# Patient Record
Sex: Male | Born: 1945
Health system: Southern US, Community
[De-identification: ages and names within clinical notes are randomized; demographics above are authoritative.]

## PROBLEM LIST (undated history)

## (undated) DIAGNOSIS — I4891 Unspecified atrial fibrillation: Secondary | ICD-10-CM

## (undated) DIAGNOSIS — K219 Gastro-esophageal reflux disease without esophagitis: Secondary | ICD-10-CM

## (undated) DIAGNOSIS — Z87442 Personal history of urinary calculi: Secondary | ICD-10-CM

## (undated) DIAGNOSIS — I10 Essential (primary) hypertension: Secondary | ICD-10-CM

## (undated) DIAGNOSIS — R112 Nausea with vomiting, unspecified: Secondary | ICD-10-CM

## (undated) DIAGNOSIS — Z9889 Other specified postprocedural states: Secondary | ICD-10-CM

## (undated) DIAGNOSIS — H9190 Unspecified hearing loss, unspecified ear: Secondary | ICD-10-CM

## (undated) DIAGNOSIS — K602 Anal fissure, unspecified: Secondary | ICD-10-CM

## (undated) DIAGNOSIS — M1990 Unspecified osteoarthritis, unspecified site: Secondary | ICD-10-CM

## (undated) DIAGNOSIS — Z8719 Personal history of other diseases of the digestive system: Secondary | ICD-10-CM

## (undated) DIAGNOSIS — M109 Gout, unspecified: Secondary | ICD-10-CM

## (undated) DIAGNOSIS — R413 Other amnesia: Secondary | ICD-10-CM

## (undated) DIAGNOSIS — N189 Chronic kidney disease, unspecified: Secondary | ICD-10-CM

## (undated) DIAGNOSIS — E119 Type 2 diabetes mellitus without complications: Secondary | ICD-10-CM

## (undated) DIAGNOSIS — N400 Enlarged prostate without lower urinary tract symptoms: Secondary | ICD-10-CM

## (undated) DIAGNOSIS — J189 Pneumonia, unspecified organism: Secondary | ICD-10-CM

## (undated) DIAGNOSIS — G4733 Obstructive sleep apnea (adult) (pediatric): Secondary | ICD-10-CM

## (undated) DIAGNOSIS — F32A Depression, unspecified: Secondary | ICD-10-CM

## (undated) DIAGNOSIS — I639 Cerebral infarction, unspecified: Secondary | ICD-10-CM

## (undated) DIAGNOSIS — F329 Major depressive disorder, single episode, unspecified: Secondary | ICD-10-CM

## (undated) DIAGNOSIS — Z8619 Personal history of other infectious and parasitic diseases: Secondary | ICD-10-CM

## (undated) HISTORY — DX: Other amnesia: R41.3

## (undated) HISTORY — PX: OTHER SURGICAL HISTORY: SHX169

## (undated) HISTORY — PX: CATARACT EXTRACTION W/ INTRAOCULAR LENS  IMPLANT, BILATERAL: SHX1307

## (undated) HISTORY — PX: EYE SURGERY: SHX253

## (undated) HISTORY — PX: CHOLECYSTECTOMY: SHX55

## (undated) HISTORY — PX: LIGAMENT REPAIR: SHX5444

## (undated) HISTORY — PX: VASECTOMY: SHX75

## (undated) HISTORY — PX: KIDNEY STONE SURGERY: SHX686

## (undated) HISTORY — PX: TONSILLECTOMY: SUR1361

## (undated) HISTORY — PX: COLONOSCOPY: SHX174

## (undated) HISTORY — PX: KNEE ARTHROSCOPY: SUR90

---

## 2002-08-15 ENCOUNTER — Encounter: Admission: RE | Admit: 2002-08-15 | Discharge: 2002-11-13 | Payer: Self-pay | Admitting: Internal Medicine

## 2004-05-12 ENCOUNTER — Emergency Department (HOSPITAL_COMMUNITY): Admission: EM | Admit: 2004-05-12 | Discharge: 2004-05-13 | Payer: Self-pay | Admitting: Emergency Medicine

## 2004-08-09 ENCOUNTER — Encounter: Admission: RE | Admit: 2004-08-09 | Discharge: 2004-08-09 | Payer: Self-pay | Admitting: Internal Medicine

## 2004-08-20 ENCOUNTER — Encounter: Admission: RE | Admit: 2004-08-20 | Discharge: 2004-08-20 | Payer: Self-pay | Admitting: Internal Medicine

## 2004-08-22 ENCOUNTER — Ambulatory Visit (HOSPITAL_COMMUNITY): Admission: RE | Admit: 2004-08-22 | Discharge: 2004-08-22 | Payer: Self-pay | Admitting: Internal Medicine

## 2005-07-22 ENCOUNTER — Ambulatory Visit (HOSPITAL_COMMUNITY): Admission: RE | Admit: 2005-07-22 | Discharge: 2005-07-22 | Payer: Self-pay | Admitting: Orthopedic Surgery

## 2005-07-22 ENCOUNTER — Ambulatory Visit (HOSPITAL_BASED_OUTPATIENT_CLINIC_OR_DEPARTMENT_OTHER): Admission: RE | Admit: 2005-07-22 | Discharge: 2005-07-22 | Payer: Self-pay | Admitting: Orthopedic Surgery

## 2007-12-20 ENCOUNTER — Encounter: Admission: RE | Admit: 2007-12-20 | Discharge: 2007-12-20 | Payer: Self-pay | Admitting: Internal Medicine

## 2009-07-03 ENCOUNTER — Encounter: Admission: RE | Admit: 2009-07-03 | Discharge: 2009-07-03 | Payer: Self-pay | Admitting: Internal Medicine

## 2009-08-16 ENCOUNTER — Ambulatory Visit (HOSPITAL_COMMUNITY): Admission: RE | Admit: 2009-08-16 | Discharge: 2009-08-16 | Payer: Self-pay | Admitting: Surgery

## 2010-04-18 ENCOUNTER — Ambulatory Visit (HOSPITAL_COMMUNITY): Admission: RE | Admit: 2010-04-18 | Discharge: 2010-04-18 | Payer: Self-pay | Admitting: Gastroenterology

## 2010-05-24 ENCOUNTER — Encounter: Admission: RE | Admit: 2010-05-24 | Discharge: 2010-05-24 | Payer: Self-pay | Admitting: Internal Medicine

## 2010-06-10 ENCOUNTER — Ambulatory Visit (HOSPITAL_COMMUNITY): Admission: RE | Admit: 2010-06-10 | Discharge: 2010-06-11 | Payer: Self-pay | Admitting: Urology

## 2010-09-01 ENCOUNTER — Encounter: Payer: Self-pay | Admitting: Internal Medicine

## 2010-10-22 LAB — GLUCOSE, CAPILLARY
Glucose-Capillary: 209 mg/dL — ABNORMAL HIGH (ref 70–99)
Glucose-Capillary: 230 mg/dL — ABNORMAL HIGH (ref 70–99)

## 2010-10-23 LAB — CBC
HCT: 36.4 % — ABNORMAL LOW (ref 39.0–52.0)
Hemoglobin: 12.7 g/dL — ABNORMAL LOW (ref 13.0–17.0)
MCH: 31.1 pg (ref 26.0–34.0)
MCHC: 34.9 g/dL (ref 30.0–36.0)
MCV: 89.2 fL (ref 78.0–100.0)
Platelets: 165 10*3/uL (ref 150–400)
RBC: 4.09 MIL/uL — ABNORMAL LOW (ref 4.22–5.81)
RDW: 13.6 % (ref 11.5–15.5)
WBC: 6.1 10*3/uL (ref 4.0–10.5)

## 2010-10-23 LAB — COMPREHENSIVE METABOLIC PANEL
ALT: 117 U/L — ABNORMAL HIGH (ref 0–53)
AST: 143 U/L — ABNORMAL HIGH (ref 0–37)
Albumin: 3.5 g/dL (ref 3.5–5.2)
Alkaline Phosphatase: 54 U/L (ref 39–117)
BUN: 25 mg/dL — ABNORMAL HIGH (ref 6–23)
CO2: 25 mEq/L (ref 19–32)
Calcium: 9.3 mg/dL (ref 8.4–10.5)
Chloride: 100 mEq/L (ref 96–112)
Creatinine, Ser: 1.27 mg/dL (ref 0.4–1.5)
GFR calc Af Amer: >60 mL/min (ref >60)
GFR calc non Af Amer: 57 mL/min — ABNORMAL LOW (ref >60)
Glucose, Bld: 388 mg/dL — ABNORMAL HIGH (ref 70–99)
Potassium: 4.7 mEq/L (ref 3.5–5.1)
Sodium: 135 mEq/L (ref 135–145)
Total Bilirubin: 0.7 mg/dL (ref 0.3–1.2)
Total Protein: 7 g/dL (ref 6.0–8.3)

## 2010-10-23 LAB — BLOOD GAS, ARTERIAL
Drawn by: 331001
FIO2: 0.6 %
PEEP: 8 cmH2O
RATE: 10 resp/min
pCO2 arterial: 37.9 mmHg (ref 35.0–45.0)
pO2, Arterial: 149 mmHg — ABNORMAL HIGH (ref 80.0–100.0)

## 2010-10-23 LAB — GLUCOSE, CAPILLARY
Glucose-Capillary: 260 mg/dL — ABNORMAL HIGH (ref 70–99)
Glucose-Capillary: 312 mg/dL — ABNORMAL HIGH (ref 70–99)
Glucose-Capillary: 318 mg/dL — ABNORMAL HIGH (ref 70–99)
Glucose-Capillary: 329 mg/dL — ABNORMAL HIGH (ref 70–99)
Glucose-Capillary: 339 mg/dL — ABNORMAL HIGH (ref 70–99)
Glucose-Capillary: 363 mg/dL — ABNORMAL HIGH (ref 70–99)
Glucose-Capillary: 382 mg/dL — ABNORMAL HIGH (ref 70–99)
Glucose-Capillary: 403 mg/dL — ABNORMAL HIGH (ref 70–99)
Glucose-Capillary: 419 mg/dL — ABNORMAL HIGH (ref 70–99)

## 2010-10-23 LAB — SURGICAL PCR SCREEN
MRSA, PCR: NEGATIVE
Staphylococcus aureus: NEGATIVE

## 2010-10-24 LAB — GLUCOSE, CAPILLARY: Glucose-Capillary: 270 mg/dL — ABNORMAL HIGH (ref 70–99)

## 2010-10-27 LAB — COMPREHENSIVE METABOLIC PANEL
ALT: 60 U/L — ABNORMAL HIGH (ref 0–53)
AST: 58 U/L — ABNORMAL HIGH (ref 0–37)
CO2: 28 mEq/L (ref 19–32)
Calcium: 9.8 mg/dL (ref 8.4–10.5)
Chloride: 101 mEq/L (ref 96–112)
GFR calc Af Amer: >60 mL/min (ref >60)
GFR calc non Af Amer: 60 mL/min — ABNORMAL LOW (ref >60)
Sodium: 139 mEq/L (ref 135–145)
Total Bilirubin: 0.5 mg/dL (ref 0.3–1.2)

## 2010-10-27 LAB — DIFFERENTIAL
Eosinophils Absolute: 0.5 10*3/uL (ref 0.0–0.7)
Eosinophils Relative: 6 % — ABNORMAL HIGH (ref 0–5)
Lymphs Abs: 2 10*3/uL (ref 0.7–4.0)
Monocytes Absolute: 0.6 10*3/uL (ref 0.1–1.0)

## 2010-10-27 LAB — CBC
RBC: 3.86 MIL/uL — ABNORMAL LOW (ref 4.22–5.81)
WBC: 8.1 10*3/uL (ref 4.0–10.5)

## 2010-10-27 LAB — GLUCOSE, CAPILLARY: Glucose-Capillary: 252 mg/dL — ABNORMAL HIGH (ref 70–99)

## 2010-12-27 NOTE — Op Note (Signed)
NAME:  Oscar [name], Oscar [name] NO.:  [id number]   MEDICAL RECORD NO.:  [id number]          PATIENT TYPE:  AMB   LOCATION:  DSC                          FACILITY:  [facility]   PHYSICIAN:  Katy Fitch. Sypher, M.D. DATE OF BIRTH:  09-05-1945   DATE OF PROCEDURE:  07/22/2005  DATE OF DISCHARGE:                                 OPERATIVE REPORT   PREOPERATIVE DIAGNOSES:  1.  Chronic stenosing tenosynovitis, right long finger A1 pulley.  2.  Ulnocarpal abutment with signs of internal derangement of ulnocarpal      joint, right wrist, rule out chondromalacia of lunate, triquetrum and      the triangular fibrocartilage degenerative changes.   POSTOPERATIVE DIAGNOSES:  1.  Chronic stenosing tenosynovitis, right long finger A1 pulley.  2.  Ulnocarpal abutment with signs of internal derangement of ulnocarpal      joint, right wrist, rule out chondromalacia of lunate, triquetrum and      the triangular fibrocartilage degenerative changes.   OPERATION:  1.  Release of right long finger A1 pulley.  2.  Injection of right radiocarpal and ulnocarpal joints with Depo-Medrol      and 2% lidocaine.   OPERATIONS:  Katy Fitch. Sypher, M.D.   ASSISTANT:  Molly Maduro Dasnoit PA-C.   ANESTHESIA:  2% lidocaine field block of right palm supplemented by IV  sedation, supervising anesthesiologist Dr. Ivin Booty.   PROCEDURE:  Oscar [name] is brought to the operating room and placed in the  supine position on the operating table.   Following an anesthesia consultation by Dr. Ivin Booty, monitored anesthesia care  with local anesthesia of the operative site was selected as appropriate.   Following transfer to room 6, [name] was placed in supine position on  the operating table.  Following light sedation, the right arm was prepped  with Betadine soap and solution and sterilely draped.  A pneumatic  tourniquet was applied to the proximal right brachium.   Following exsanguination of the right arm with an  Esmarch bandage, the  arterial tourniquet was inflated to 250 mmHg.   The procedure commenced with infiltration of 2% lidocaine in the path of the  intended incision in the long finger flexor sheath.   After appropriate sedation, the wrist was injected between the triangular  fibrocartilage and the triquetrum with approximately 10 pounds' traction  applied and the landmarks palpated.  A 50/50 mixture of Depo-Medrol 40 mg/mL  and 2% lidocaine plain was injected 1 mL into the ulnocarpal joint and 1 mL  into the radiocarpal joint at the 3/4 dorsal portal used for arthroscopy.   The injections were uncomplicated.   Attention was then directed to the trigger release.   The hand was placed in a lead hand and the operative site examined.  The  thickened A1 pulley was easily palpated.   An oblique incision was fashioned in the distal palmar crease exposing the  palmar fascia.  This was released with scissors.  The A1 pulley was isolated  and subsequently split along its radial border with a scalpel and scissors.   A small A0  pulley was identified and released.   Thereafter, Mr. Dollar recovered full active range of motion of the long  finger, bringing the fingernail to the palm.   The wound was then repaired with mattress sutures of 5-0 nylon.  There no  apparent complications.   For aftercare [name] was placed a compressive dressing with Xeroflo,  sterile gauze and Ace wrap.   He will return to see [name] the office in one week for wound check and suture  removal.   He is provided prescriptions for Vicodin 5 mg one p.o. q.4-6h. p.r.n. pain,  20 tablets without refill.      Katy Fitch Sypher, M.D.  Electronically Signed     RVS/MEDQ  D:  07/22/2005  T:  07/23/2005  Job:  [id number]

## 2011-03-19 ENCOUNTER — Encounter (INDEPENDENT_AMBULATORY_CARE_PROVIDER_SITE_OTHER): Payer: Self-pay | Admitting: Ophthalmology

## 2011-04-09 ENCOUNTER — Encounter (INDEPENDENT_AMBULATORY_CARE_PROVIDER_SITE_OTHER): Payer: BC Managed Care – PPO | Admitting: Ophthalmology

## 2011-04-09 DIAGNOSIS — E11319 Type 2 diabetes mellitus with unspecified diabetic retinopathy without macular edema: Secondary | ICD-10-CM

## 2011-04-09 DIAGNOSIS — H43819 Vitreous degeneration, unspecified eye: Secondary | ICD-10-CM

## 2011-04-09 DIAGNOSIS — H2510 Age-related nuclear cataract, unspecified eye: Secondary | ICD-10-CM

## 2011-10-09 ENCOUNTER — Ambulatory Visit (INDEPENDENT_AMBULATORY_CARE_PROVIDER_SITE_OTHER): Payer: BC Managed Care – PPO | Admitting: Ophthalmology

## 2011-10-27 ENCOUNTER — Ambulatory Visit (INDEPENDENT_AMBULATORY_CARE_PROVIDER_SITE_OTHER): Payer: BC Managed Care – PPO | Admitting: Ophthalmology

## 2011-10-27 DIAGNOSIS — I10 Essential (primary) hypertension: Secondary | ICD-10-CM

## 2011-10-27 DIAGNOSIS — H2510 Age-related nuclear cataract, unspecified eye: Secondary | ICD-10-CM

## 2011-10-27 DIAGNOSIS — H35039 Hypertensive retinopathy, unspecified eye: Secondary | ICD-10-CM

## 2011-10-27 DIAGNOSIS — E1139 Type 2 diabetes mellitus with other diabetic ophthalmic complication: Secondary | ICD-10-CM

## 2011-10-27 DIAGNOSIS — H43819 Vitreous degeneration, unspecified eye: Secondary | ICD-10-CM

## 2011-10-27 DIAGNOSIS — E11319 Type 2 diabetes mellitus with unspecified diabetic retinopathy without macular edema: Secondary | ICD-10-CM

## 2012-03-03 ENCOUNTER — Other Ambulatory Visit: Payer: Self-pay | Admitting: Internal Medicine

## 2012-03-03 DIAGNOSIS — R439 Unspecified disturbances of smell and taste: Secondary | ICD-10-CM

## 2012-03-07 ENCOUNTER — Ambulatory Visit
Admission: RE | Admit: 2012-03-07 | Discharge: 2012-03-07 | Disposition: A | Discharge status: Home / Self Care | Payer: BC Managed Care – PPO | Source: Ambulatory Visit | Attending: Internal Medicine | Admitting: Internal Medicine

## 2012-03-07 DIAGNOSIS — R439 Unspecified disturbances of smell and taste: Secondary | ICD-10-CM

## 2012-04-28 ENCOUNTER — Ambulatory Visit (INDEPENDENT_AMBULATORY_CARE_PROVIDER_SITE_OTHER): Payer: BC Managed Care – PPO | Admitting: Ophthalmology

## 2012-05-19 ENCOUNTER — Ambulatory Visit (INDEPENDENT_AMBULATORY_CARE_PROVIDER_SITE_OTHER): Payer: BC Managed Care – PPO | Admitting: Ophthalmology

## 2012-11-16 ENCOUNTER — Encounter (HOSPITAL_COMMUNITY): Payer: Self-pay | Admitting: *Deleted

## 2012-11-16 ENCOUNTER — Inpatient Hospital Stay (HOSPITAL_COMMUNITY)
Admission: EM | Admit: 2012-11-16 | Discharge: 2012-11-18 | DRG: 138 | Disposition: A | Discharge status: Home / Self Care | Payer: BC Managed Care – PPO | Attending: Internal Medicine | Admitting: Internal Medicine

## 2012-11-16 DIAGNOSIS — Z87442 Personal history of urinary calculi: Secondary | ICD-10-CM

## 2012-11-16 DIAGNOSIS — R161 Splenomegaly, not elsewhere classified: Secondary | ICD-10-CM | POA: Diagnosis present

## 2012-11-16 DIAGNOSIS — I2510 Atherosclerotic heart disease of native coronary artery without angina pectoris: Secondary | ICD-10-CM | POA: Diagnosis present

## 2012-11-16 DIAGNOSIS — I319 Disease of pericardium, unspecified: Secondary | ICD-10-CM | POA: Diagnosis present

## 2012-11-16 DIAGNOSIS — Z794 Long term (current) use of insulin: Secondary | ICD-10-CM

## 2012-11-16 DIAGNOSIS — R072 Precordial pain: Secondary | ICD-10-CM | POA: Diagnosis present

## 2012-11-16 DIAGNOSIS — T380X5A Adverse effect of glucocorticoids and synthetic analogues, initial encounter: Secondary | ICD-10-CM | POA: Diagnosis present

## 2012-11-16 DIAGNOSIS — I2584 Coronary atherosclerosis due to calcified coronary lesion: Secondary | ICD-10-CM | POA: Diagnosis present

## 2012-11-16 DIAGNOSIS — I519 Heart disease, unspecified: Secondary | ICD-10-CM | POA: Diagnosis present

## 2012-11-16 DIAGNOSIS — Z6839 Body mass index (BMI) 39.0-39.9, adult: Secondary | ICD-10-CM

## 2012-11-16 DIAGNOSIS — Z91041 Radiographic dye allergy status: Secondary | ICD-10-CM

## 2012-11-16 DIAGNOSIS — E1165 Type 2 diabetes mellitus with hyperglycemia: Secondary | ICD-10-CM | POA: Diagnosis present

## 2012-11-16 DIAGNOSIS — Z79899 Other long term (current) drug therapy: Secondary | ICD-10-CM

## 2012-11-16 DIAGNOSIS — N182 Chronic kidney disease, stage 2 (mild): Secondary | ICD-10-CM | POA: Diagnosis present

## 2012-11-16 DIAGNOSIS — R071 Chest pain on breathing: Secondary | ICD-10-CM

## 2012-11-16 DIAGNOSIS — I484 Atypical atrial flutter: Secondary | ICD-10-CM

## 2012-11-16 DIAGNOSIS — R091 Pleurisy: Secondary | ICD-10-CM | POA: Diagnosis present

## 2012-11-16 DIAGNOSIS — I4891 Unspecified atrial fibrillation: Principal | ICD-10-CM | POA: Diagnosis present

## 2012-11-16 DIAGNOSIS — M129 Arthropathy, unspecified: Secondary | ICD-10-CM | POA: Diagnosis present

## 2012-11-16 DIAGNOSIS — Z9089 Acquired absence of other organs: Secondary | ICD-10-CM

## 2012-11-16 DIAGNOSIS — G4733 Obstructive sleep apnea (adult) (pediatric): Secondary | ICD-10-CM | POA: Diagnosis present

## 2012-11-16 DIAGNOSIS — K219 Gastro-esophageal reflux disease without esophagitis: Secondary | ICD-10-CM | POA: Diagnosis present

## 2012-11-16 DIAGNOSIS — I4892 Unspecified atrial flutter: Secondary | ICD-10-CM | POA: Diagnosis present

## 2012-11-16 DIAGNOSIS — I129 Hypertensive chronic kidney disease with stage 1 through stage 4 chronic kidney disease, or unspecified chronic kidney disease: Secondary | ICD-10-CM | POA: Diagnosis present

## 2012-11-16 DIAGNOSIS — IMO0002 Reserved for concepts with insufficient information to code with codable children: Secondary | ICD-10-CM | POA: Diagnosis present

## 2012-11-16 DIAGNOSIS — R079 Chest pain, unspecified: Secondary | ICD-10-CM | POA: Diagnosis present

## 2012-11-16 DIAGNOSIS — M109 Gout, unspecified: Secondary | ICD-10-CM | POA: Diagnosis present

## 2012-11-16 DIAGNOSIS — R0781 Pleurodynia: Secondary | ICD-10-CM

## 2012-11-16 DIAGNOSIS — E118 Type 2 diabetes mellitus with unspecified complications: Secondary | ICD-10-CM | POA: Diagnosis present

## 2012-11-16 HISTORY — DX: Gastro-esophageal reflux disease without esophagitis: K21.9

## 2012-11-16 HISTORY — DX: Unspecified osteoarthritis, unspecified site: M19.90

## 2012-11-16 HISTORY — DX: Essential (primary) hypertension: I10

## 2012-11-16 HISTORY — DX: Obstructive sleep apnea (adult) (pediatric): G47.33

## 2012-11-16 HISTORY — DX: Anal fissure, unspecified: K60.2

## 2012-11-16 HISTORY — DX: Type 2 diabetes mellitus without complications: E11.9

## 2012-11-16 HISTORY — DX: Personal history of other diseases of the digestive system: Z87.19

## 2012-11-16 LAB — POCT I-STAT, CHEM 8
BUN: 22 mg/dL (ref 6–23)
Chloride: 106 mEq/L (ref 96–112)
Creatinine, Ser: 1.3 mg/dL (ref 0.50–1.35)
Glucose, Bld: 216 mg/dL — ABNORMAL HIGH (ref 70–99)
Potassium: 4 mEq/L (ref 3.5–5.1)
Sodium: 143 mEq/L (ref 135–145)

## 2012-11-16 LAB — GLUCOSE, CAPILLARY
Glucose-Capillary: 254 mg/dL — ABNORMAL HIGH (ref 70–99)
Glucose-Capillary: 261 mg/dL — ABNORMAL HIGH (ref 70–99)
Glucose-Capillary: 362 mg/dL — ABNORMAL HIGH (ref 70–99)

## 2012-11-16 LAB — HEPARIN LEVEL (UNFRACTIONATED)
Heparin Unfractionated: 0.19 IU/mL — ABNORMAL LOW (ref 0.30–0.70)
Heparin Unfractionated: 0.26 IU/mL — ABNORMAL LOW (ref 0.30–0.70)

## 2012-11-16 LAB — CBC WITH DIFFERENTIAL/PLATELET
Hemoglobin: 13.5 g/dL (ref 13.0–17.0)
Lymphocytes Relative: 20 % (ref 12–46)
Lymphs Abs: 2.2 10*3/uL (ref 0.7–4.0)
MCH: 31.1 pg (ref 26.0–34.0)
Monocytes Relative: 10 % (ref 3–12)
Neutro Abs: 7.3 10*3/uL (ref 1.7–7.7)
Neutrophils Relative %: 67 % (ref 43–77)
RBC: 4.34 MIL/uL (ref 4.22–5.81)
WBC: 10.9 10*3/uL — ABNORMAL HIGH (ref 4.0–10.5)

## 2012-11-16 LAB — D-DIMER, QUANTITATIVE: D-Dimer, Quant: 0.36 ug/mL-FEU (ref 0.00–0.48)

## 2012-11-16 LAB — POCT I-STAT TROPONIN I

## 2012-11-16 LAB — TROPONIN I: Troponin I: <0.3 ng/mL (ref <0.30)

## 2012-11-16 LAB — TSH: TSH: 1.165 u[IU]/mL (ref 0.350–4.500)

## 2012-11-16 MED ORDER — LORATADINE 10 MG PO TABS
10.0000 mg | ORAL_TABLET | Freq: Every day | ORAL | Status: DC
  Administered 2012-11-16 – 2012-11-17 (×2): 10 mg via ORAL
  Filled 2012-11-16 (×3): qty 1

## 2012-11-16 MED ORDER — DILTIAZEM LOAD VIA INFUSION
15.0000 mg | Freq: Once | INTRAVENOUS | Status: AC
  Administered 2012-11-16: 15 mg via INTRAVENOUS
  Filled 2012-11-16: qty 15

## 2012-11-16 MED ORDER — HEPARIN (PORCINE) IN NACL 100-0.45 UNIT/ML-% IJ SOLN
2000.0000 [IU]/h | INTRAMUSCULAR | Status: DC
  Administered 2012-11-16: 14:00:00 1800 [IU]/h via INTRAVENOUS
  Administered 2012-11-16: 1550 [IU]/h via INTRAVENOUS
  Administered 2012-11-16: 20:00:00 1800 [IU]/h via INTRAVENOUS
  Filled 2012-11-16 (×5): qty 250

## 2012-11-16 MED ORDER — INSULIN ASPART 100 UNIT/ML ~~LOC~~ SOLN: 0.0000 [IU] | Freq: Three times a day (TID) | SUBCUTANEOUS | Status: DC

## 2012-11-16 MED ORDER — PREDNISONE 50 MG PO TABS
50.0000 mg | ORAL_TABLET | Freq: Once | ORAL | Status: AC
  Administered 2012-11-16: 20:00:00 50 mg via ORAL
  Filled 2012-11-16: qty 1

## 2012-11-16 MED ORDER — KETOROLAC TROMETHAMINE 30 MG/ML IJ SOLN
30.0000 mg | Freq: Three times a day (TID) | INTRAMUSCULAR | Status: DC | PRN
  Administered 2012-11-16 (×2): 30 mg via INTRAVENOUS
  Filled 2012-11-16 (×2): qty 1

## 2012-11-16 MED ORDER — INSULIN ASPART 100 UNIT/ML ~~LOC~~ SOLN
0.0000 [IU] | Freq: Every day | SUBCUTANEOUS | Status: DC
  Administered 2012-11-16: 22:00:00 5 [IU] via SUBCUTANEOUS

## 2012-11-16 MED ORDER — DIPHENHYDRAMINE HCL 50 MG PO CAPS
50.0000 mg | ORAL_CAPSULE | ORAL | Status: AC
  Administered 2012-11-16: 50 mg via ORAL
  Filled 2012-11-16: qty 1

## 2012-11-16 MED ORDER — ONDANSETRON HCL 4 MG/2ML IJ SOLN: 4.0000 mg | Freq: Four times a day (QID) | INTRAMUSCULAR | Status: DC | PRN

## 2012-11-16 MED ORDER — INSULIN ASPART 100 UNIT/ML ~~LOC~~ SOLN
0.0000 [IU] | Freq: Three times a day (TID) | SUBCUTANEOUS | Status: DC
  Administered 2012-11-16 (×3): 8 [IU] via SUBCUTANEOUS
  Administered 2012-11-17 (×2): 15 [IU] via SUBCUTANEOUS
  Administered 2012-11-17: 20:00:00 11 [IU] via SUBCUTANEOUS
  Administered 2012-11-18: 2 [IU] via SUBCUTANEOUS

## 2012-11-16 MED ORDER — TERAZOSIN HCL 5 MG PO CAPS
5.0000 mg | ORAL_CAPSULE | Freq: Every day | ORAL | Status: DC
  Administered 2012-11-16 – 2012-11-17 (×2): 5 mg via ORAL
  Filled 2012-11-16 (×3): qty 1

## 2012-11-16 MED ORDER — FEBUXOSTAT 40 MG PO TABS
80.0000 mg | ORAL_TABLET | Freq: Every day | ORAL | Status: DC
  Administered 2012-11-16 – 2012-11-17 (×2): 80 mg via ORAL
  Filled 2012-11-16 (×3): qty 2

## 2012-11-16 MED ORDER — ACETAMINOPHEN 650 MG RE SUPP: 650.0000 mg | Freq: Four times a day (QID) | RECTAL | Status: DC | PRN

## 2012-11-16 MED ORDER — DILTIAZEM HCL 30 MG PO TABS
30.0000 mg | ORAL_TABLET | Freq: Four times a day (QID) | ORAL | Status: DC
  Administered 2012-11-16 – 2012-11-17 (×3): 30 mg via ORAL
  Filled 2012-11-16 (×7): qty 1

## 2012-11-16 MED ORDER — MORPHINE SULFATE 4 MG/ML IJ SOLN
4.0000 mg | Freq: Once | INTRAMUSCULAR | Status: AC
  Administered 2012-11-16: 4 mg via INTRAVENOUS
  Filled 2012-11-16: qty 1

## 2012-11-16 MED ORDER — ONDANSETRON HCL 4 MG PO TABS: 4.0000 mg | ORAL_TABLET | Freq: Four times a day (QID) | ORAL | Status: DC | PRN

## 2012-11-16 MED ORDER — MORPHINE SULFATE 2 MG/ML IJ SOLN
2.0000 mg | INTRAMUSCULAR | Status: DC | PRN
  Administered 2012-11-16: 12:00:00 2 mg via INTRAVENOUS
  Filled 2012-11-16: qty 1

## 2012-11-16 MED ORDER — HYDROMORPHONE HCL PF 1 MG/ML IJ SOLN
1.0000 mg | Freq: Once | INTRAMUSCULAR | Status: AC
  Administered 2012-11-16: 1 mg via INTRAVENOUS
  Filled 2012-11-16: qty 1

## 2012-11-16 MED ORDER — DIPHENHYDRAMINE HCL 50 MG PO CAPS
50.0000 mg | ORAL_CAPSULE | Freq: Once | ORAL | Status: DC
  Filled 2012-11-16: qty 1

## 2012-11-16 MED ORDER — DILTIAZEM HCL 100 MG IV SOLR
5.0000 mg/h | INTRAVENOUS | Status: DC
  Administered 2012-11-16: 5 mg/h via INTRAVENOUS
  Administered 2012-11-16: 3 mg/h via INTRAVENOUS

## 2012-11-16 MED ORDER — HEPARIN BOLUS VIA INFUSION
4000.0000 [IU] | Freq: Once | INTRAVENOUS | Status: AC
  Administered 2012-11-16: 4000 [IU] via INTRAVENOUS

## 2012-11-16 MED ORDER — ASPIRIN EC 325 MG PO TBEC
325.0000 mg | DELAYED_RELEASE_TABLET | Freq: Every day | ORAL | Status: DC
  Administered 2012-11-16 – 2012-11-17 (×2): 325 mg via ORAL
  Filled 2012-11-16 (×3): qty 1

## 2012-11-16 MED ORDER — NITROGLYCERIN 0.4 MG SL SUBL: 0.4000 mg | SUBLINGUAL_TABLET | SUBLINGUAL | Status: DC | PRN

## 2012-11-16 MED ORDER — PREDNISONE 50 MG PO TABS
50.0000 mg | ORAL_TABLET | Freq: Once | ORAL | Status: AC
  Administered 2012-11-16: 50 mg via ORAL
  Filled 2012-11-16: qty 1

## 2012-11-16 MED ORDER — ACETAMINOPHEN 325 MG PO TABS: 650.0000 mg | ORAL_TABLET | Freq: Four times a day (QID) | ORAL | Status: DC | PRN

## 2012-11-16 MED ORDER — GLYBURIDE 5 MG PO TABS
5.0000 mg | ORAL_TABLET | Freq: Two times a day (BID) | ORAL | Status: DC
  Administered 2012-11-16 – 2012-11-18 (×4): 5 mg via ORAL
  Filled 2012-11-16 (×6): qty 1

## 2012-11-16 MED ORDER — SODIUM CHLORIDE 0.9 % IV SOLN
INTRAVENOUS | Status: AC
  Administered 2012-11-16: 06:00:00 via INTRAVENOUS

## 2012-11-16 MED ORDER — PREDNISONE 50 MG PO TABS
50.0000 mg | ORAL_TABLET | Freq: Once | ORAL | Status: AC
  Administered 2012-11-17: 02:00:00 50 mg via ORAL
  Filled 2012-11-16: qty 1

## 2012-11-16 MED ORDER — PANTOPRAZOLE SODIUM 40 MG PO TBEC
40.0000 mg | DELAYED_RELEASE_TABLET | Freq: Every day | ORAL | Status: DC
  Administered 2012-11-16 – 2012-11-18 (×3): 40 mg via ORAL
  Filled 2012-11-16 (×3): qty 1

## 2012-11-16 MED ORDER — PREDNISONE 50 MG PO TABS
50.0000 mg | ORAL_TABLET | Freq: Four times a day (QID) | ORAL | Status: DC
  Filled 2012-11-16 (×3): qty 1

## 2012-11-16 NOTE — ED Notes (Signed)
MD at bedside. Consult 

## 2012-11-16 NOTE — Progress Notes (Signed)
Utilization Review Completed Deberah Adolf J. Pedro Oldenburg, RN, BSN, NCM [phone]  

## 2012-11-16 NOTE — ED Provider Notes (Signed)
History     CSN: [id number]  Arrival date & time 11/16/12  4098   First MD Initiated Contact with Patient 11/16/12 0102      Chief Complaint  Patient presents with  . Chest Pain   HPI Oscar [name] is a 67 y.o. male with a history of diabetes, but no known history of hypertension, hyperlipidemia, remote smoking history who presents with substernal chest pain and shortness of breath.  Patient's symptoms started approximately 8:00 are described as a cramping/grabbing sensation in the substernal area, that started mildly however got worse over the course of the evening is currently an 8/10. He describes it as worse on deep breaths, he does not have any history of venous thromboembolic disease, no history of hemoptysis, no history of recent immobilization, long bone fracture or cancer treatment. Patient did have his lites back and forth to [facility] on 3/12 and 3/18 but denies any history of leg pain or leg swelling unilaterally or bilaterally. Family history is pertinent for sister dying from MI at aged 66.  Denies any history of fevers, chills, productive cough, any upper respiratory symptoms.  Past Medical History  Diagnosis Date  . Diabetes mellitus without complication     History reviewed. No pertinent past surgical history.  History reviewed. No pertinent family history.  History  Substance Use Topics  . Smoking status: Never Smoker   . Smokeless tobacco: Not on file  . Alcohol Use: Yes      Review of Systems At least 10pt or greater review of systems completed and are negative except where specified in the HPI.   Allergies  Iohexol  Home Medications   Current Outpatient Rx  Name  Route  Sig  Dispense  Refill  . febuxostat (ULORIC) 40 MG tablet   Oral   Take 80 mg by mouth daily.         Marland Kitchen Fexofenadine HCl (ALLEGRA PO)   Oral   Take 1 tablet by mouth daily.         Marland Kitchen glyBURIDE (DIABETA) 5 MG tablet   Oral   Take 5 mg by mouth 2 (two) times daily with a meal.         . metFORMIN (GLUCOPHAGE) 1000 MG tablet   Oral   Take 1,000 mg by mouth 2 (two) times daily with a meal.         . terazosin (HYTRIN) 5 MG capsule   Oral   Take 5 mg by mouth at bedtime.           BP 171/79  Pulse 149  Temp(Src) 98.9 F (37.2 C) (Oral)  Resp 20  SpO2 98%  Physical Exam  Nursing notes reviewed.  Electronic medical record reviewed. VITAL SIGNS:   Filed Vitals:   11/16/12 0400 11/16/12 0445 11/16/12 0500 11/16/12 0645  BP: 126/91 124/80  137/64  Pulse: 87 94  89  Temp:      TempSrc:      Resp: 15 20  20   Height:   5\' 9"  (1.753 m)   Weight:   265 lb (120.203 kg)   SpO2: 98% 97%  95%   CONSTITUTIONAL: Awake, oriented, appears non-toxic HENT: Atraumatic, normocephalic, oral mucosa pink and moist, airway patent. Nares patent without drainage. External ears normal. EYES: Conjunctiva clear, EOMI, PERRLA NECK: Trachea midline, non-tender, supple CARDIOVASCULAR: Tachycardic, irregularly irregular PULMONARY/CHEST: Clear to auscultation, no rhonchi, wheezes, or rales. Symmetrical breath sounds. Non-tender. ABDOMINAL: Non-distended, obese, soft, non-tender - no rebound or guarding.  BS normal. NEUROLOGIC: Non-focal, moving all four extremities, no gross sensory or motor deficits. EXTREMITIES: No clubbing, cyanosis, or edema SKIN: Warm, Dry, No erythema, No rash  ED Course  Procedures (including critical care time)  Date: 11/16/2012  Rate: 153  Rhythm: Atrial fibrillation with rapid ventricular response, occasional sinus beats  QRS Axis: normal  Intervals: normal  ST/T Wave abnormalities: normal  Conduction Disutrbances: none  Narrative Interpretation: Occasional PVC, appears to be fibrillation in the inferior leads, no signs of ischemia or infarction   Labs Reviewed  CBC WITH DIFFERENTIAL - Abnormal; Notable for the following:    WBC 10.9 (*)    HCT 38.0 (*)    Monocytes Absolute 1.1 (*)    All other components within normal limits  POCT  I-STAT, CHEM 8 - Abnormal; Notable for the following:    Glucose, Bld 216 (*)    All other components within normal limits  D-DIMER, QUANTITATIVE  TROPONIN I  TROPONIN I  TROPONIN I  HEPARIN LEVEL (UNFRACTIONATED)  POCT I-STAT TROPONIN I   No results found.   1. Atrial fibrillation with rapid ventricular response   2. Chest pain       MDM  Oscar [name] is a 67 y.o. male presenting with atrial fibrillation and rapid ventricular response this seems to be paroxysmal this is been in and out of this rhythm this is been in the ER. I don't see evidence of ischemia or infarction on his EKG. Patient was placed on a diltiazem drip for rate control, the patient's rate came down to 80s and 90s, pain got somewhat better however still having chest pain. Troponin negative. Discussed with cardiology for admission. Patient has negative d-dimer, a low pretest probability for PE in this patient - do not think his pleuritic chest pain is related to his lungs.           Jones Skene, MD 11/16/12 [phone]

## 2012-11-16 NOTE — Progress Notes (Signed)
ANTICOAGULATION CONSULT NOTE - Initial Consult  Pharmacy Consult for Heparin Indication: atrial fibrillation  Allergies  Allergen Reactions  . Iohexol      Desc: HIVES S/P 13HR.PREMEDS,PT WEIGHS 367LBS,?LOW DOSAGE PREMEDS     Patient Measurements: Height: 5\' 9"  (175.3 cm) Weight: 265 lb (120.203 kg) IBW/kg (Calculated) : 70.7 Heparin Dosing Weight: 100 kg   Vital Signs: Temp: 98.9 F (37.2 C) (04/08 0038) Temp src: Oral (04/08 0038) BP: 124/80 mmHg (04/08 0445) Pulse Rate: 94 (04/08 0445)  Labs:  Recent Labs  11/16/12 0040 11/16/12 0053  HGB 13.5 13.3  HCT 38.0* 39.0  PLT 203  --   CREATININE  --  1.30    Estimated Creatinine Clearance: 70.6 ml/min (by C-G formula based on Cr of 1.3).   Medical History: Past Medical History  Diagnosis Date  . Diabetes mellitus without complication     Medications:  Uloric  Allegra  Glyburide  Metformin  Hytrin  Assessment: 67 yo male with new onset Afib for heparin  Goal of Therapy:  Heparin level 0.3-0.7 units/ml Monitor platelets by anticoagulation protocol: Yes   Plan:  Heparin 4000 units IV bolus, then 1550 units/hr Check heparin level in 6 hours.   Eddie Candle 11/16/2012,5:24 AM

## 2012-11-16 NOTE — ED Notes (Addendum)
Fast regular pulse with dropped pulse beats at registration, back to triage for EKG, took "regular bayer ASA" 325mg  PTA, c/o CP and SOB, denies cardiac hx, skin clammy. Wife present.

## 2012-11-16 NOTE — H&P (Signed)
Patient's PCP: Pearla Dubonnet, MD Patient's endocrinologist: Dr. Sharl Ma.  Chief Complaint: Chest pain  History of Present Illness: Oscar [name] is a 67 y.o. Caucasian male with history of diabetes, gout, and hypertension which is controlled through diet and exercise he presents with the above complaints.  Last night around 8 p.m. before supper patient developing midsternal chest pain, with some radiating pain to the left neck.  During the night the pain continued to worsen as a result he presented to the ER for further evaluation.  He was found to be in A. fib with RVR and was started on diltiazem which converted him to normal sinus rhythm.  Hospitalist service was asked but the patient for further care and management.  He denies any pain running down his left arm, any jaw pain or diaphoresis.  He denies having these symptoms before.  He has not had any recent fevers, chills, nausea, vomiting, shortness of breath, abdominal pain, diarrhea, headaches or vision changes  Review of Systems: All systems reviewed with the patient and positive as per history of present illness, otherwise all other systems are negative.  Past Medical History  Diagnosis Date  . Diabetes mellitus without complication    History reviewed. No pertinent past surgical history. Family History  Problem Relation Age of Onset  . COPD Father    History   Social History  . Marital Status: Married    Spouse Name: N/A    Number of Children: N/A  . Years of Education: N/A   Occupational History  . Not on file.   Social History Main Topics  . Smoking status: Never Smoker   . Smokeless tobacco: Not on file  . Alcohol Use: Yes  . Drug Use: No  . Sexually Active: Not on file   Other Topics Concern  . Not on file   Social History Narrative  . No narrative on file   Allergies: Iohexol  Home Meds: Prior to Admission medications   Medication Sig Start Date End Date Taking? Authorizing Provider  febuxostat  (ULORIC) 40 MG tablet Take 80 mg by mouth daily.   Yes Historical Provider, MD  Fexofenadine HCl (ALLEGRA PO) Take 1 tablet by mouth daily.   Yes Historical Provider, MD  glyBURIDE (DIABETA) 5 MG tablet Take 5 mg by mouth 2 (two) times daily with a meal.   Yes Historical Provider, MD  metFORMIN (GLUCOPHAGE) 1000 MG tablet Take 1,000 mg by mouth 2 (two) times daily with a meal.   Yes Historical Provider, MD  terazosin (HYTRIN) 5 MG capsule Take 5 mg by mouth at bedtime.   Yes Historical Provider, MD    Physical Exam: Blood pressure 124/80, pulse 94, temperature 98.9 F (37.2 C), temperature source Oral, resp. rate 20, height 5\' 9"  (1.753 m), weight 120.203 kg (265 lb), SpO2 97.00%. General: Awake, Oriented x3, No acute distress. HEENT: EOMI, Moist mucous membranes Neck: Supple CV: S1 and S2 Lungs: Clear to ascultation bilaterally Abdomen: Soft, Nontender, Nondistended, +bowel sounds. Ext: Good pulses. Trace edema. No clubbing or cyanosis noted. Neuro: Cranial Nerves II-XII grossly intact. Has 5/5 motor strength in upper and lower extremities.  Lab results:  Recent Labs  11/16/12 0053  NA 143  K 4.0  CL 106  GLUCOSE 216*  BUN 22  CREATININE 1.30   No results found for this basename: AST, ALT, ALKPHOS, BILITOT, PROT, ALBUMIN,  in the last 72 hours No results found for this basename: LIPASE, AMYLASE,  in the last 72 hours  Recent Labs  11/16/12 0040 11/16/12 0053  WBC 10.9*  --   NEUTROABS 7.3  --   HGB 13.5 13.3  HCT 38.0* 39.0  MCV 87.6  --   PLT 203  --    No results found for this basename: CKTOTAL, CKMB, CKMBINDEX, TROPONINI,  in the last 72 hours No components found with this basename: POCBNP,   Recent Labs  11/16/12 0151  DDIMER 0.36   No results found for this basename: HGBA1C,  in the last 72 hours No results found for this basename: CHOL, HDL, LDLCALC, TRIG, CHOLHDL, LDLDIRECT,  in the last 72 hours No results found for this basename: TSH, T4TOTAL, FREET3,  T3FREE, THYROIDAB,  in the last 72 hours No results found for this basename: VITAMINB12, FOLATE, FERRITIN, TIBC, IRON, RETICCTPCT,  in the last 72 hours Imaging results:  No results found. Other results: EKG: EKG shows sinus tachycardia with heart rate 103, rhythm strips showed A. fib with RVR.  Assessment & Plan by Problem: Chest pain Likely due to A. fib with RVR.  Initial troponin negative.  Continue to monitor on telemetry.  Cycle cardiac enzymes.  EKG shows sinus tachycardia without ischemic changes.  Given patient had A. fib, consider cardiology consultation in the morning for cardiac testing.  Atrial fibrillation with rapid ventricular rate Patient converted to sinus rhythm on diltiazem.  Continue diltiazem, titrate for heart rate less than 100, hold if systolic blood pressure less than 100.  Will get a 2-D echocardiogram.  Consider cardiology consultation in the morning.  Patient has CHA2DS2-VASC score of 2, anticoagulate the patient on heparin initially, likely will need long-term anticoagulation after discharge.  Morbid obesity Continue to diet and exercise as outpatient.  Hypertension Currently not on any antihypertensive medications.  At home is controlled with diet and exercise.  Type 2 diabetes uncontrolled with complications Sliding scale insulin.  Continue home medications except metformin.  Chronic kidney disease stage II Renal function at baseline.  Prophylaxis Heparin drip.  CODE STATUS Full code.  Disposition Admit the patient to step down as inpatient.  Transfer the patient to Dr. Judithann Sauger service in the morning.  Time spent on admission, talking to the patient, and coordinating care was: 60 mins.  Nateisha Moyd A, MD 11/16/2012, 5:30 AM

## 2012-11-16 NOTE — Progress Notes (Signed)
ANTICOAGULATION CONSULT NOTE - Follow Up Consult  Pharmacy Consult for heparin Indication: atrial fibrillation  Allergies  Allergen Reactions  . Iohexol      Desc: HIVES S/P 13HR.PREMEDS,PT WEIGHS 367LBS,?LOW DOSAGE PREMEDS     Patient Measurements: Height: 5\' 9"  (175.3 cm) Weight: 265 lb (120.203 kg) IBW/kg (Calculated) : 70.7 Heparin Dosing Weight: 100 kg  Vital Signs: Temp: 98.1 F (36.7 C) (04/08 1930) Temp src: Oral (04/08 1930) BP: 124/77 mmHg (04/08 1930) Pulse Rate: 74 (04/08 1930)  Labs:  Recent Labs  11/16/12 0040 11/16/12 0053 11/16/12 1219 11/16/12 1734 11/16/12 1952  HGB 13.5 13.3  --   --   --   HCT 38.0* 39.0  --   --   --   PLT 203  --   --   --   --   HEPARINUNFRC  --   --  0.19*  --  0.26*  CREATININE  --  1.30  --   --   --   TROPONINI  --   --  <0.30 <0.30  --     Estimated Creatinine Clearance: 70.6 ml/min (by C-G formula based on Cr of 1.3).   Medications:  Scheduled:  . aspirin EC  325 mg Oral Daily  . [COMPLETED] diltiazem  15 mg Intravenous Once  . diltiazem  30 mg Oral Q6H  . [COMPLETED] diphenhydrAMINE  50 mg Oral NOW  . febuxostat  80 mg Oral Daily  . glyBURIDE  5 mg Oral BID WC  . [COMPLETED] heparin  4,000 Units Intravenous Once  . [COMPLETED]  HYDROmorphone (DILAUDID) injection  1 mg Intravenous Once  . insulin aspart  0-15 Units Subcutaneous TID WC  . insulin aspart  0-5 Units Subcutaneous QHS  . loratadine  10 mg Oral Daily  . [COMPLETED]  morphine injection  4 mg Intravenous Once  . pantoprazole  40 mg Oral Daily  . [COMPLETED] predniSONE  50 mg Oral Once  . [COMPLETED] predniSONE  50 mg Oral Once  . [START ON 11/17/2012] predniSONE  50 mg Oral Once  . terazosin  5 mg Oral QHS  . [DISCONTINUED] diphenhydrAMINE  50 mg Oral Once  . [DISCONTINUED] insulin aspart  0-15 Units Subcutaneous TID WC  . [DISCONTINUED] predniSONE  50 mg Oral Q6H   Infusions:  . sodium chloride 75 mL/hr at 11/16/12 0900  . heparin 1,800  Units/hr (11/16/12 1934)  . [DISCONTINUED] diltiazem (CARDIZEM) infusion 3 mg/hr (11/16/12 1015)    Assessment: Heparin for new onset Afib. Also concern for PE due to flight to [facility] few weeks ago vs pericarditis. Will get CT to r/o PE. DDimer 0.36 WNL. Heparin level 0.26. H/H 13.5/38 and Plt 203 K on 11/16/12.   Goal of Therapy:  Heparin level 0.3-0.7 units/ml Monitor platelets by anticoagulation protocol: Yes   Plan:  1) Increase IV heparin to 2000 units/hr  2) Recheck heparin level with am labs.    Kenady Doxtater S. Merilynn Finland, PharmD, BCPS Clinical Staff Pharmacist Pager [phone]  Misty Stanley Stillinger 11/16/2012,8:40 PM

## 2012-11-16 NOTE — ED Notes (Signed)
Pt c/o CP with SOB that began this evening while watching television.  Denies n/v, diaphoresis, dizziness/weakness.

## 2012-11-16 NOTE — Progress Notes (Signed)
ANTICOAGULATION CONSULT NOTE - Follow Up Consult  Pharmacy Consult for heparin Indication: atrial fibrillation  Allergies  Allergen Reactions  . Iohexol      Desc: HIVES S/P 13HR.PREMEDS,PT WEIGHS 367LBS,?LOW DOSAGE PREMEDS     Patient Measurements: Height: 5\' 9"  (175.3 cm) Weight: 265 lb (120.203 kg) IBW/kg (Calculated) : 70.7 Heparin Dosing Weight: 100 kg  Vital Signs: Temp: 97.8 F (36.6 C) (04/08 1201) Temp src: Oral (04/08 1201) BP: 104/64 mmHg (04/08 1201) Pulse Rate: 76 (04/08 1201)  Labs:  Recent Labs  11/16/12 0040 11/16/12 0053 11/16/12 1219  HGB 13.5 13.3  --   HCT 38.0* 39.0  --   PLT 203  --   --   HEPARINUNFRC  --   --  0.19*  CREATININE  --  1.30  --     Estimated Creatinine Clearance: 70.6 ml/min (by C-G formula based on Cr of 1.3).   Medications:  Scheduled:  . aspirin EC  325 mg Oral Daily  . [COMPLETED] diltiazem  15 mg Intravenous Once  . [COMPLETED] diphenhydrAMINE  50 mg Oral NOW  . febuxostat  80 mg Oral Daily  . glyBURIDE  5 mg Oral BID WC  . [COMPLETED] heparin  4,000 Units Intravenous Once  . [COMPLETED]  HYDROmorphone (DILAUDID) injection  1 mg Intravenous Once  . insulin aspart  0-15 Units Subcutaneous TID WC  . insulin aspart  0-5 Units Subcutaneous QHS  . loratadine  10 mg Oral Daily  . [COMPLETED]  morphine injection  4 mg Intravenous Once  . pantoprazole  40 mg Oral Daily  . [COMPLETED] predniSONE  50 mg Oral Once  . predniSONE  50 mg Oral Once  . [START ON 11/17/2012] predniSONE  50 mg Oral Once  . terazosin  5 mg Oral QHS  . [DISCONTINUED] diphenhydrAMINE  50 mg Oral Once  . [DISCONTINUED] insulin aspart  0-15 Units Subcutaneous TID WC  . [DISCONTINUED] predniSONE  50 mg Oral Q6H   Infusions:  . sodium chloride 75 mL/hr at 11/16/12 0900  . diltiazem (CARDIZEM) infusion 3 mg/hr (11/16/12 1015)  . heparin 1,550 Units/hr (11/16/12 0900)    Assessment: 67 yo male with new onset afib is currently on subtherapeutic  heparin.  Heparin level is 0.19.  H/H 13.5/38 and Plt 203 K Goal of Therapy:  Heparin level 0.3-0.7 units/ml Monitor platelets by anticoagulation protocol: Yes   Plan:  1) Increase heparin drip to 1800 units/hr 2) 6hr heparin level.  Tramon Crescenzo, Tsz-Yin 11/16/2012,1:44 PM

## 2012-11-16 NOTE — Consult Note (Signed)
CARDIOLOGY CONSULT NOTE  Patient ID: HAGOP MCCOLLAM, MRN: [id number], DOB/AGE: 10/01/1945 67 y.o. Admit date: 11/16/2012   Date of Consult: 11/16/2012 Primary Physician: Pearla Dubonnet, MD Primary Cardiologist: new to [facility]. Dr. Gala Romney cares for his wife.  Chief Complaint: chest pain Reason for Consult: chest pain, afib  HPI: Mr. Daversa is a 67 y/o M with history of DM/HTN controlled mostly by diet/exercise, OSA on CPAP, and GERD who presented to [facility] with complaints of chest pain. Last night around 8pm while watching TV, he developed gradual onset of chest pain with inspiration described as a muscle spasm sensation in the center of his chest. It was not associated with any SOB, nausea, diaphoresis, or palpitations. He took 325mg  ASA prior to admission. The symptoms persisted so he came to the ER just after midnight where he was found to be in a rapid narrow-complex rhythm with RVR and was started on diltiazem with spontaneous conversion to NSR. He has never had pain like this before. He currently feels better than on admission, but still has pain with inspiration in the center of his chest. It is somewhat better when sitting upright (but he also feels generally better from a back/butt standpoint getting out of the bed). He does not exercise much but went golfing this week without any exertional CP or SOB. He otherwise denies any recent fever, chills, nausea, vomiting, LEE. He flew to [facility] (5 hr plane ride) a few weeks ago. Troponin neg x 1 and d-dimer WNL, WBC mildly elevated to 10.9. EKG nonacute when in sinus.  Past Medical History  Diagnosis Date  . Diabetes mellitus without complication     improved after diet/exercise  . Hypertension     improved after diet/exercise  . Kidney stones   . GERD (gastroesophageal reflux disease)   . H/O hiatal hernia   . Arthritis     hips  . OSA (obstructive sleep apnea)     wears cpap  . Anal fissure     h/o - no recent complications       Most Recent Cardiac Studies: None   Surgical History:  Past Surgical History  Procedure Laterality Date  . Cholecystectomy    . Knee arthroscopy    . Carpel tunnel    . Ligament repair    . Kidney stone surgery       Home Meds: Prior to Admission medications   Medication Sig Start Date End Date Taking? Authorizing Provider  febuxostat (ULORIC) 40 MG tablet Take 80 mg by mouth daily.   Yes Historical Provider, MD  Fexofenadine HCl (ALLEGRA PO) Take 1 tablet by mouth daily.   Yes Historical Provider, MD  glyBURIDE (DIABETA) 5 MG tablet Take 5 mg by mouth 2 (two) times daily with a meal.   Yes Historical Provider, MD  metFORMIN (GLUCOPHAGE) 1000 MG tablet Take 1,000 mg by mouth 2 (two) times daily with a meal.   Yes Historical Provider, MD  terazosin (HYTRIN) 5 MG capsule Take 5 mg by mouth at bedtime.   Yes Historical Provider, MD    Inpatient Medications:  . aspirin EC  325 mg Oral Daily  . febuxostat  80 mg Oral Daily  . glyBURIDE  5 mg Oral BID WC  . insulin aspart  0-15 Units Subcutaneous TID WC  . insulin aspart  0-5 Units Subcutaneous QHS  . loratadine  10 mg Oral Daily  . terazosin  5 mg Oral QHS    Allergies:  Allergies  Allergen Reactions  . Iohexol      Desc: HIVES S/P 13HR.PREMEDS,PT WEIGHS 367LBS,?LOW DOSAGE PREMEDS     History   Social History  . Marital Status: Married    Spouse Name: N/A    Number of Children: N/A  . Years of Education: N/A   Occupational History  . Not on file.   Social History Main Topics  . Smoking status: Never Smoker   . Smokeless tobacco: Never Used  . Alcohol Use: Yes     Comment: rare - 1-2x /yr  . Drug Use: No  . Sexually Active: Not on file   Other Topics Concern  . Not on file   Social History Narrative  . No narrative on file     Family History  Problem Relation Age of Onset  . COPD Father   . Heart attack Sister     died age 30 of MI     Review of Systems: General: negative for chills, fever,  night sweats or weight changes.  Cardiovascular: see above. No orthopnea. Dermatological: negative for rash Respiratory: negative for cough or wheezing Urologic: negative for hematuria Abdominal: negative for nausea, vomiting, diarrhea, bright red blood per rectum, melena, or hematemesis. Remote h/o rectal bleeding from anal fissure but none in a long time. Neurologic: negative for visual changes, syncope, or dizziness All other systems reviewed and are otherwise negative except as noted above.  Labs: No results found for this basename: CKTOTAL, CKMB, TROPONINI,  in the last 72 hours Lab Results  Component Value Date   WBC 10.9* 11/16/2012   HGB 13.3 11/16/2012   HCT 39.0 11/16/2012   MCV 87.6 11/16/2012   PLT 203 11/16/2012    Recent Labs Lab 11/16/12 0053  NA 143  K 4.0  CL 106  BUN 22  CREATININE 1.30  GLUCOSE 216*    Lab Results  Component Value Date   DDIMER 0.36 11/16/2012    Radiology/Studies:  No results found.  EKG: Initial: atrial tach vs atrial flutter RVR 153bpm, PVC nonspecific ST-T changes Followup: sinus tach 104bpm, PVC and PAC noted, nonspecific ST-T changes  Physical Exam: Blood pressure 129/84, pulse 76, temperature 98.1 F (36.7 C), temperature source Oral, resp. rate 20, height 5\' 9"  (1.753 m), weight 265 lb (120.203 kg), SpO2 96.00%. General: Well developed obese WM in no acute distress. Head: Normocephalic, atraumatic, sclera non-icteric, no xanthomas, nares are without discharge.  Neck: Negative for carotid bruits. JVD not elevated. Lungs: Clear bilaterally to auscultation without wheezes, rales, or rhonchi. Breathing is unlabored. Heart: RRR with S1 S2. No murmurs, rubs, or gallops appreciated. Abdomen: Soft, non-tender, obese, with normoactive bowel sounds. No hepatomegaly. No rebound/guarding. No obvious abdominal masses. Msk:  Strength and tone appear normal for age. Extremities: No clubbing or cyanosis. No edema.  Distal pedal pulses are 2+ and  equal bilaterally. Neuro: Alert and oriented X 3. No facial asymmetry. No focal deficit. Moves all extremities spontaneously. Psych:  Responds to questions appropriately with a normal affect.   Assessment and Plan:   1. Chest pain, pleuritic/positional 2. Atrial tach vs atrial flutter with RVR, first recognized episode 3. Diabetes mellitus 4. HTN, controlled with diet & exercise  Signed, Ronie Spies PA-C 11/16/2012, 10:43 AM  Patient seen and examined with Ronie Spies, PA-C. We discussed all aspects of the encounter. I agree with the assessment and plan as stated above.   Mr. Oboyle is a 67 y/o with HTN and DM2 and OSA on CPAP. No known h/o  heart disease. Admitted with probable atypical flutter with RVR (vs atrial tach). No back in SR. CP pleuritic in nature and also very positional (worse on lying down). I am concerned about PE or pericarditis. He had plane flight to [facility] 2 weeks ago.  CE and ECG normal. D-dimer negative. Would continue heparin for now. Check CT for PE and pericardial disease. (given dye allergy with hives will pretreat). Will also check echo. Treat pain with toradol. Given CHADS2 will likely need outpatient anticoagulation with Eliquis or Xarelto. D/w Dr. Kevan Ny.    Daniel Bensimhon,MD 12:09 PM

## 2012-11-16 NOTE — Progress Notes (Signed)
Subjective: Oscar [name] presented to [facility] last night with chest pain. Patient experienced pain radiating into his left neck. Not associated with shortness of breath or diaphoresis. There is a pleuritic nature to his substernal chest pain as well. No fevers or chills. Concern is for pericarditis or PE the patient also was noted to have atrial flutter and fibrillation with a rapid ventricular response. Now in normal sinus rhythm on diltiazem. Appreciate consultation by Dr. Nicholes Mango  Objective: Weight change:   Intake/Output Summary (Last 24 hours) at 11/16/12 1806 Last data filed at 11/16/12 0505  Gross per 24 hour  Intake      0 ml  Output    300 ml  Net   -300 ml   Filed Vitals:   11/16/12 1100 11/16/12 1200 11/16/12 1201 11/16/12 1555  BP: 133/73 104/64 104/64 104/43  Pulse: 77 77 76 76  Temp:   97.8 F (36.6 C) 98.8 F (37.1 C)  TempSrc:   Oral Oral  Resp:   20 20  Height:      Weight:      SpO2: 92% 97% 95% 95%    General Appearance: Alert, cooperative, no distress, appears stated age Neck:   No JVD or bruits Lungs: Clear to auscultation bilaterally, respirations unlabored Heart: Regular rate and rhythm, S1 and S2 normal, no murmur, rub or gallop Abdomen: Soft, non-tender, bowel sounds active all four quadrants, no masses, no organomegaly Extremities: Extremities normal, atraumatic, no cyanosis or edema Neuro: Alert and oriented nonfocal  Lab Results: Results for orders placed during the hospital encounter of 11/16/12 (from the past 48 hour(s))  CBC WITH DIFFERENTIAL     Status: Abnormal   Collection Time    11/16/12 12:40 AM      Result Value Range   WBC 10.9 (*) 4.0 - 10.5 K/uL   RBC 4.34  4.22 - 5.81 MIL/uL   Hemoglobin 13.5  13.0 - 17.0 g/dL   HCT 57.8 (*) 46.9 - 62.9 %   MCV 87.6  78.0 - 100.0 fL   MCH 31.1  26.0 - 34.0 pg   MCHC 35.5  30.0 - 36.0 g/dL   RDW 52.8  41.3 - 24.4 %   Platelets 203  150 - 400 K/uL   Neutrophils Relative 67  43 -  77 %   Neutro Abs 7.3  1.7 - 7.7 K/uL   Lymphocytes Relative 20  12 - 46 %   Lymphs Abs 2.2  0.7 - 4.0 K/uL   Monocytes Relative 10  3 - 12 %   Monocytes Absolute 1.1 (*) 0.1 - 1.0 K/uL   Eosinophils Relative 2  0 - 5 %   Eosinophils Absolute 0.3  0.0 - 0.7 K/uL   Basophils Relative 0  0 - 1 %   Basophils Absolute 0.0  0.0 - 0.1 K/uL  POCT I-STAT TROPONIN I     Status: None   Collection Time    11/16/12 12:51 AM      Result Value Range   Troponin i, poc 0.00  0.00 - 0.08 ng/mL   Comment 3            Comment: Due to the release kinetics of cTnI,     a negative result within the first hours     of the onset of symptoms does not rule out     myocardial infarction with certainty.     If myocardial infarction is still suspected,     repeat the  test at appropriate intervals.  POCT I-STAT, CHEM 8     Status: Abnormal   Collection Time    11/16/12 12:53 AM      Result Value Range   Sodium 143  135 - 145 mEq/L   Potassium 4.0  3.5 - 5.1 mEq/L   Chloride 106  96 - 112 mEq/L   BUN 22  6 - 23 mg/dL   Creatinine, Ser 1.61  0.50 - 1.35 mg/dL   Glucose, Bld 096 (*) 70 - 99 mg/dL   Calcium, Ion 0.45  4.09 - 1.30 mmol/L   TCO2 26  0 - 100 mmol/L   Hemoglobin 13.3  13.0 - 17.0 g/dL   HCT 81.1  91.4 - 78.2 %  D-DIMER, QUANTITATIVE     Status: None   Collection Time    11/16/12  1:51 AM      Result Value Range   D-Dimer, Quant 0.36  0.00 - 0.48 ug/mL-FEU   Comment:            AT THE INHOUSE ESTABLISHED CUTOFF     VALUE OF 0.48 ug/mL FEU,     THIS ASSAY HAS BEEN DOCUMENTED     IN THE LITERATURE TO HAVE     A SENSITIVITY AND NEGATIVE     PREDICTIVE VALUE OF AT LEAST     98 TO 99%.  THE TEST RESULT     SHOULD BE CORRELATED WITH     AN ASSESSMENT OF THE CLINICAL     PROBABILITY OF DVT / VTE.  GLUCOSE, CAPILLARY     Status: Abnormal   Collection Time    11/16/12  9:03 AM      Result Value Range   Glucose-Capillary 254 (*) 70 - 99 mg/dL  TROPONIN I     Status: None   Collection Time     11/16/12 12:19 PM      Result Value Range   Troponin I <0.30  <0.30 ng/mL   Comment:            Due to the release kinetics of cTnI,     a negative result within the first hours     of the onset of symptoms does not rule out     myocardial infarction with certainty.     If myocardial infarction is still suspected,     repeat the test at appropriate intervals.  HEPARIN LEVEL (UNFRACTIONATED)     Status: Abnormal   Collection Time    11/16/12 12:19 PM      Result Value Range   Heparin Unfractionated 0.19 (*) 0.30 - 0.70 IU/mL   Comment:            IF HEPARIN RESULTS ARE BELOW     EXPECTED VALUES, AND PATIENT     DOSAGE HAS BEEN CONFIRMED,     SUGGEST FOLLOW UP TESTING     OF ANTITHROMBIN III LEVELS.  TSH     Status: None   Collection Time    11/16/12 12:19 PM      Result Value Range   TSH 1.165  0.350 - 4.500 uIU/mL  GLUCOSE, CAPILLARY     Status: Abnormal   Collection Time    11/16/12 12:36 PM      Result Value Range   Glucose-Capillary 297 (*) 70 - 99 mg/dL  GLUCOSE, CAPILLARY     Status: Abnormal   Collection Time    11/16/12  3:58 PM      Result Value Range  Glucose-Capillary 261 (*) 70 - 99 mg/dL    Studies/Results: No results found. Medications: Scheduled Meds: . aspirin EC  325 mg Oral Daily  . diltiazem  30 mg Oral Q6H  . febuxostat  80 mg Oral Daily  . glyBURIDE  5 mg Oral BID WC  . insulin aspart  0-15 Units Subcutaneous TID WC  . insulin aspart  0-5 Units Subcutaneous QHS  . loratadine  10 mg Oral Daily  . pantoprazole  40 mg Oral Daily  . predniSONE  50 mg Oral Once  . [START ON 11/17/2012] predniSONE  50 mg Oral Once  . terazosin  5 mg Oral QHS   Continuous Infusions: . sodium chloride 75 mL/hr at 11/16/12 0900  . heparin 1,800 Units/hr (11/16/12 1345)   PRN Meds:.acetaminophen, acetaminophen, ketorolac, morphine injection, nitroGLYCERIN, ondansetron (ZOFRAN) IV, ondansetron  Assessment/Plan: Principal Problem:   Chest pain -  intrafibrillation with pleuritic chest pain. Rule out pericarditis and/or pulmonary embolism. Patient being premedicated for chest CT to check for pulmonary emboli. D-dimer is relatively low, fortunately. Active Problems:   Atrial fibrillation with RVR - no normal sinus rhythm on diltiazem. We'll need to transition to oral diltiazem and will likely need outpatient anticoagulation per cardiology   Gout - asymptomatic currently   Diabetes mellitus type 2, uncontrolled, with complications - usual medications with sliding scale NovoLog insulin   CKD (chronic kidney disease), stage II   Atypical atrial flutter - back in sinus rhythm   Pleuritic chest pain - treat with Toradol p.r.n. And workup as above   Morbid obesity   Sleep apnea on CPAP    LOS: 0 days   Pearla Dubonnet, MD 11/16/2012, 6:06 PM

## 2012-11-17 ENCOUNTER — Encounter (HOSPITAL_COMMUNITY): Payer: Self-pay | Admitting: Radiology

## 2012-11-17 ENCOUNTER — Inpatient Hospital Stay (HOSPITAL_COMMUNITY): Payer: BC Managed Care – PPO

## 2012-11-17 DIAGNOSIS — I517 Cardiomegaly: Secondary | ICD-10-CM

## 2012-11-17 LAB — BASIC METABOLIC PANEL
CO2: 19 mEq/L (ref 19–32)
Chloride: 106 mEq/L (ref 96–112)
Creatinine, Ser: 1.48 mg/dL — ABNORMAL HIGH (ref 0.50–1.35)
GFR calc Af Amer: 55 mL/min — ABNORMAL LOW (ref >90)
Potassium: 4.7 mEq/L (ref 3.5–5.1)
Sodium: 135 mEq/L (ref 135–145)

## 2012-11-17 LAB — GLUCOSE, CAPILLARY
Glucose-Capillary: 368 mg/dL — ABNORMAL HIGH (ref 70–99)
Glucose-Capillary: 333 mg/dL — ABNORMAL HIGH (ref 70–99)
Glucose-Capillary: 199 mg/dL — ABNORMAL HIGH (ref 70–99)

## 2012-11-17 LAB — HEPARIN LEVEL (UNFRACTIONATED): Heparin Unfractionated: 0.48 IU/mL (ref 0.30–0.70)

## 2012-11-17 LAB — HEMOGLOBIN A1C
Hgb A1c MFr Bld: 6.4 % — ABNORMAL HIGH (ref <5.7)
Mean Plasma Glucose: 137 mg/dL — ABNORMAL HIGH (ref <117)

## 2012-11-17 LAB — LIPID PANEL
LDL Cholesterol: 101 mg/dL — ABNORMAL HIGH (ref 0–99)
Triglycerides: 137 mg/dL (ref <150)
VLDL: 27 mg/dL (ref 0–40)

## 2012-11-17 LAB — CBC
Platelets: 155 10*3/uL (ref 150–400)
RBC: 3.57 MIL/uL — ABNORMAL LOW (ref 4.22–5.81)
RDW: 14.1 % (ref 11.5–15.5)
WBC: 9.3 10*3/uL (ref 4.0–10.5)

## 2012-11-17 LAB — SEDIMENTATION RATE: Sed Rate: 42 mm/hr — ABNORMAL HIGH (ref 0–16)

## 2012-11-17 MED ORDER — HEPARIN (PORCINE) IN NACL 100-0.45 UNIT/ML-% IJ SOLN
2100.0000 [IU]/h | INTRAMUSCULAR | Status: DC
  Administered 2012-11-18 (×2): 2100 [IU]/h via INTRAVENOUS
  Filled 2012-11-17 (×3): qty 250

## 2012-11-17 MED ORDER — OFF THE BEAT BOOK
Freq: Once | Status: AC
  Administered 2012-11-17: 17:00:00
  Filled 2012-11-17: qty 1

## 2012-11-17 MED ORDER — DILTIAZEM HCL ER COATED BEADS 180 MG PO CP24: 180.0000 mg | ORAL_CAPSULE | Freq: Every day | ORAL | Status: DC

## 2012-11-17 MED ORDER — ASPIRIN 325 MG PO TBEC: 325.0000 mg | DELAYED_RELEASE_TABLET | Freq: Every day | ORAL | Status: DC

## 2012-11-17 MED ORDER — IOHEXOL 350 MG/ML SOLN
100.0000 mL | Freq: Once | INTRAVENOUS | Status: AC | PRN
  Administered 2012-11-17: 100 mL via INTRAVENOUS

## 2012-11-17 MED ORDER — DILTIAZEM HCL 90 MG PO TABS: 180.0000 mg | ORAL_TABLET | Freq: Once | ORAL | Status: DC

## 2012-11-17 MED ORDER — DILTIAZEM HCL ER COATED BEADS 180 MG PO CP24
180.0000 mg | ORAL_CAPSULE | Freq: Every day | ORAL | Status: DC
  Administered 2012-11-17: 180 mg via ORAL
  Filled 2012-11-17 (×2): qty 1

## 2012-11-17 MED ORDER — DILTIAZEM HCL ER COATED BEADS 180 MG PO CP24
180.0000 mg | ORAL_CAPSULE | Freq: Once | ORAL | Status: AC
  Administered 2012-11-17: 20:00:00 180 mg via ORAL
  Filled 2012-11-17: qty 1

## 2012-11-17 MED ORDER — PANTOPRAZOLE SODIUM 40 MG PO TBEC: 40.0000 mg | DELAYED_RELEASE_TABLET | Freq: Every day | ORAL | Status: DC

## 2012-11-17 NOTE — Progress Notes (Signed)
  Patient with recurrent AFL/AF today. Will suspend discharge.  Echo EF 60%. pseudonormal DD. RV ok. LA 4.8 cm  I have discussed with Dr. Ladona Ridgel in EP. He will see tomorrow to determine if he is a candidate for ablation or if we should start with anti-arrhythmic.   Heparin restarted.   Sherrel Ploch,MD 4:53 PM

## 2012-11-17 NOTE — Progress Notes (Addendum)
Cardiology Rounding Note   Subjective:    67 y/o male with h/o HTN, DM admitted with pleuritic CP and transient atypical flutter. CT scan chest yesterday negative for PE. Results below:  1. No evidence of significant pulmonary embolus.  2. Mild patchy bibasilar atelectasis noted; lungs otherwise clear.  3. Diffuse coronary artery calcifications seen.  4. Splenomegaly noted.   Started on toradol for possible pericarditis. No pericardial effusion noted on CT. Echo still pending. Cr 1.27->1.48. Trop - x 2.   No further CP. No arrhythmias on tele.   Objective:   Weight Range:  Vital Signs:   Temp:  [97.8 F (36.6 C)-98.8 F (37.1 C)] 97.8 F (36.6 C) (04/09 0616) Pulse Rate:  [73-79] 78 (04/09 0616) Resp:  [20-22] 20 (04/08 1555) BP: (104-139)/(43-84) 139/74 mmHg (04/09 0616) SpO2:  [91 %-97 %] 93 % (04/09 0616) Weight:  [120.7 kg (266 lb 1.5 oz)] 120.7 kg (266 lb 1.5 oz) (04/09 0006) Last BM Date: 11/15/12  Weight change: Filed Weights   11/16/12 0500 11/17/12 0006  Weight: 120.203 kg (265 lb) 120.7 kg (266 lb 1.5 oz)    Intake/Output:   Intake/Output Summary (Last 24 hours) at 11/17/12 0731 Last data filed at 11/17/12 0700  Gross per 24 hour  Intake 3055.17 ml  Output    600 ml  Net 2455.17 ml     Physical Exam: General:  Well appearing. No resp difficulty HEENT: normal Neck: supple. JVP . Carotids 2+ bilat; no bruits. No lymphadenopathy or thryomegaly appreciated. Cor: PMI nondisplaced. Regular rate & rhythm. No rubs, gallops or murmurs. Lungs: clear Abdomen: obese soft, nontender, nondistended. No hepatosplenomegaly. No bruits or masses. Good bowel sounds. Extremities: no cyanosis, clubbing, rash, edema Neuro: alert & orientedx3, cranial nerves grossly intact. moves all 4 extremities w/o difficulty. Affect pleasant  Telemetry: SR with PACs  Labs: Basic Metabolic Panel:  Recent Labs Lab 11/16/12 0053 11/17/12 0620  NA 143 135  K 4.0 4.7  CL 106 106   CO2  --  19  GLUCOSE 216* 420*  BUN 22 31*  CREATININE 1.30 1.48*  CALCIUM  --  8.8    Liver Function Tests: No results found for this basename: AST, ALT, ALKPHOS, BILITOT, PROT, ALBUMIN,  in the last 168 hours No results found for this basename: LIPASE, AMYLASE,  in the last 168 hours No results found for this basename: AMMONIA,  in the last 168 hours  CBC:  Recent Labs Lab 11/16/12 0040 11/16/12 0053 11/17/12 0620  WBC 10.9*  --  9.3  NEUTROABS 7.3  --   --   HGB 13.5 13.3 11.1*  HCT 38.0* 39.0 31.5*  MCV 87.6  --  88.2  PLT 203  --  155    Cardiac Enzymes:  Recent Labs Lab 11/16/12 1219 11/16/12 1734  TROPONINI <0.30 <0.30    BNP: BNP (last 3 results) No results found for this basename: PROBNP,  in the last 8760 hours   Other results:  EKG:   Imaging: Ct Angio Chest Pe W/cm &/or Wo Cm  11/17/2012  *RADIOLOGY REPORT*  Clinical Data: Chest pain with inspiration.  CT ANGIOGRAPHY CHEST  Technique:  Multidetector CT imaging of the chest using the standard protocol during bolus administration of intravenous contrast. Multiplanar reconstructed images including MIPs were obtained and reviewed to evaluate the vascular anatomy.  Contrast: OMNIPAQUE IOHEXOL 350 MG/ML SOLN  Comparison: Chest radiograph performed 06/10/2010  Findings: There is no evidence of significant pulmonary embolus.  Mild patchy  bibasilar atelectasis is noted.  Evaluation is suboptimal due to motion artifact.  The lungs are otherwise clear. There is no evidence of significant focal consolidation, pleural effusion or pneumothorax.  No masses are identified; no abnormal focal contrast enhancement is seen.  Diffuse coronary artery calcification is noted.  The mediastinum is otherwise unremarkable in appearance.  No mediastinal lymphadenopathy is seen.  No pericardial effusion is identified. The great vessels are grossly unremarkable in appearance.  No axillary lymphadenopathy is seen.  The visualized  portions of the thyroid gland are unremarkable in appearance.  The visualized portions of the liver are unremarkable.  The spleen is enlarged, measuring 16.3 cm in length.  No acute osseous abnormalities are seen.  IMPRESSION:  1.  No evidence of significant pulmonary embolus. 2.  Mild patchy bibasilar atelectasis noted; lungs otherwise clear. 3.  Diffuse coronary artery calcifications seen. 4.  Splenomegaly noted.   Original Report Authenticated By: Tonia Ghent, M.D.       Medications:     Scheduled Medications: . aspirin EC  325 mg Oral Daily  . diltiazem  30 mg Oral Q6H  . febuxostat  80 mg Oral Daily  . glyBURIDE  5 mg Oral BID WC  . insulin aspart  0-15 Units Subcutaneous TID WC  . insulin aspart  0-5 Units Subcutaneous QHS  . loratadine  10 mg Oral Daily  . pantoprazole  40 mg Oral Daily  . terazosin  5 mg Oral QHS     Infusions: . heparin 2,000 Units/hr (11/16/12 2050)     PRN Medications:  acetaminophen, acetaminophen, ketorolac, morphine injection, nitroGLYCERIN, ondansetron (ZOFRAN) IV, ondansetron   Assessment:   1. Chest pain, pleuritic/positional       -CE negative      -CT chest negative PE. Diffuse Coronary calcifications 2. Atypical atrial flutter with RVR, first recognized episode  3. Diabetes mellitus  4. HTN, controlled with diet & exercise 5. Contrast dye allergy 6. Obesity 7. OSA on CPAP 8. Acute kidney injury likely due to contrast and NSAIDs  Plan/Discussion:    CP resolved. Etiology unclear but appeared pericarditic by history. CT negative for PE but + for coronary Ca2+. Enzymes normal.  Echo pending. If echo normal I think he can go home today and let kidneys recover. We discussed further risk stratification with cath vs stress test next week as outpatient (I favor cath due to coronary calcium, risk factors and presence of splenomegaly - ? Elevated RH pressures). Would start statin.  With regard to AFL. He is reluctant to start oral  anti-coagulation. We discussed thromboembolic risk of AFL less than AF but still present. He is at high risk for recurrent atrial dysrhythmias due to size and OSA. Will place monitor as outpatient and if further dysrhythmias would re-discuss NOAC. Uses ASA 325 for now. Would continue po Cardizem CD 240 mg daily.  D/w Dr. Kevan Ny.    Length of Stay: 1   Arvilla Meres 11/17/2012, 7:31 AM

## 2012-11-17 NOTE — Discharge Summary (Addendum)
Physician Discharge Summary  NAME:Oscar [name]  NGE:[id number]  DOB: [birth year]   Admit date: 11/16/2012 Discharge date: 11/17/2012  Discharge Diagnoses:  Principal Problem:   Chest pain - resolved with IV Toradol - question etiology.  2-D echocardiogram pending.  Sedimentation rate 42.  Possible low-grade pericarditis or pleurisy Active Problems:   Atrial fibrillation with RVR - now in sinus rhythm on diltiazem - followup with Dr. Gala Romney in one week.  Echocardiogram pending   Gout - asymptomatic currently   Diabetes mellitus type 2, uncontrolled, with complications - hyperglycemic this a.m. after oral prednisone through the night before CT angio of the chest to rule out pulmonary embolus.  Will hold metformin for one more day prior to resumption at home   CKD (chronic kidney disease), stage II   Atypical atrial flutter - now in sinus rhythm but at risk for recurrent atrial flutter - as per cardiology   Pleuritic chest pain - resolved with IV Toradol and, as above   Morbid obesity   Obstructive sleep apnea - continue CPAP at home   Discharge Physical Exam:  General Appearance: Morbidly obese, alert, cooperative, no distress, appears stated age  Weight change: 0.497 kg (1 lb 1.5 oz)  Intake/Output Summary (Last 24 hours) at 11/17/12 0758 Last data filed at 11/17/12 0700  Gross per 24 hour  Intake 3055.17 ml  Output    600 ml  Net 2455.17 ml   Filed Vitals:   11/16/12 1555 11/16/12 1930 11/17/12 0006 11/17/12 0616  BP: 104/43 124/77 119/63 139/74  Pulse: 76 74 73 78  Temp: 98.8 F (37.1 C) 98.1 F (36.7 C) 98.5 F (36.9 C) 97.8 F (36.6 C)  TempSrc: Oral Oral Oral Oral  Resp: 20     Height:      Weight:   120.7 kg (266 lb 1.5 oz)   SpO2: 95% 92% 91% 93%   Neck: No JVD or bruits  Lungs: Clear to auscultation bilaterally, respirations unlabored  Heart: Regular rate and rhythm, S1 and S2 normal, no murmur, rub or gallop  Abdomen: Soft, non-tender, bowel sounds active all  four quadrants, no masses, no organomegaly  Extremities: Extremities normal, atraumatic, no cyanosis or edema  Neuro: Alert and oriented nonfocal  Discharge Condition: Improved, now in normal sinus rhythm  Hospital Course: Oscar [name] is a pleasant 67 year old male with history of hypertension, morbid obesity, type 2 diabetes mellitus, OSA on CPAP and GERD as well as gout.  Patient was admitted yesterday early a.m. after experiencing gradual onset of inspiratory chest pain started around 8 PM the night before.  He thought he was having a muscle spasm in the center of his chest.  He does shortness of breath nausea or diaphoresis or palpitations did notice that the pain radiated into his left neck.  He to 325 milligrams of aspirin and came to the emergency room right after midnight where he was found to be in a rapid narrow complex rhythm with rapid ventricular response and was started on IV diltiazem and had spontaneous conversion to normal sinus rhythm.  Continue to have his a chest pain that resolved with IV Toradol.  He also had 3 doses of prednisone through the evening yesterday to obtain ACTH gram of the chest to rule out pulmonary and was.  Pulmonary embolus was ruled out.  No evidence of clotting.  A.m. blood sugar was 420 this morning secondary to the steroids almost certainly.  He had a normal d-dimer.  EKG now in sinus rhythm.  Troponins were negative.  He will be discharged on diltiazem 180 milligrams daily and he will resume metformin tomorrow.  Discharge creatinine 1.4.  He is to followup with Dr. Gala Romney in one week.  Echocardiogram is still pending this morning.  Things to follow up in the outpatient setting: Recurrence of chest pain and presence of irregular heart rhythm and blood sugar  Consults: Treatment Team:  Marden Noble, MD [facility], MD  Disposition:  Patient to be discharged this afternoon as long as echocardiogram is satisfactory for further workup as an outpatient  and not inpatient.  He does have coronary calcifications as noted on chest CT     Medication List    ASK your doctor about these medications       ALLEGRA PO  Take 1 tablet by mouth daily.     Febuxostat 40 MG tablet  Commonly known as:  ULORIC  Take 80 mg by mouth daily.     GlyBURIDE 5 MG tablet  Commonly known as:  DIABETA  Take 5 mg by mouth 2 (two) times daily with a meal.     MetFORMIN 1000 MG tablet  Commonly known as:  GLUCOPHAGE  Take 1,000 mg by mouth 2 (two) times daily with a meal. Resume metformin on 11/18/2012, Thursday as per usual dosing Diltiazem SR 180 milligrams   1 capsule in a.m. daily     Terazosin 5 MG capsule  Commonly known as:  HYTRIN  Take 5 mg by mouth at bedtime. Pantoprazol 40 milligrams  1 tablet daily Aspirin 325 milligrams 1 tablet daily          The results of significant diagnostics from this hospitalization (including imaging, microbiology, ancillary and laboratory) are listed below for reference.    Significant Diagnostic Studies: Ct Angio Chest Pe W/cm &/or Wo Cm  11/17/2012  *RADIOLOGY REPORT*  Clinical Data: Chest pain with inspiration.  CT ANGIOGRAPHY CHEST  Technique:  Multidetector CT imaging of the chest using the standard protocol during bolus administration of intravenous contrast. Multiplanar reconstructed images including MIPs were obtained and reviewed to evaluate the vascular anatomy.  Contrast: OMNIPAQUE IOHEXOL 350 MG/ML SOLN  Comparison: Chest radiograph performed 06/10/2010  Findings: There is no evidence of significant pulmonary embolus.  Mild patchy bibasilar atelectasis is noted.  Evaluation is suboptimal due to motion artifact.  The lungs are otherwise clear. There is no evidence of significant focal consolidation, pleural effusion or pneumothorax.  No masses are identified; no abnormal focal contrast enhancement is seen.  Diffuse coronary artery calcification is noted.  The mediastinum is otherwise unremarkable  in appearance.  No mediastinal lymphadenopathy is seen.  No pericardial effusion is identified. The great vessels are grossly unremarkable in appearance.  No axillary lymphadenopathy is seen.  The visualized portions of the thyroid gland are unremarkable in appearance.  The visualized portions of the liver are unremarkable.  The spleen is enlarged, measuring 16.3 cm in length.  No acute osseous abnormalities are seen.  IMPRESSION:  1.  No evidence of significant pulmonary embolus. 2.  Mild patchy bibasilar atelectasis noted; lungs otherwise clear. 3.  Diffuse coronary artery calcifications seen. 4.  Splenomegaly noted.   Original Report Authenticated By: Tonia Ghent, M.D.     Microbiology: No results found for this or any previous visit (from the past 240 hour(s)).   Labs: Results for orders placed during the hospital encounter of 11/16/12  CBC WITH DIFFERENTIAL      Result Value Range   WBC 10.9 (*)  4.0 - 10.5 K/uL   RBC 4.34  4.22 - 5.81 MIL/uL   Hemoglobin 13.5  13.0 - 17.0 g/dL   HCT 47.8 (*) 29.5 - 62.1 %   MCV 87.6  78.0 - 100.0 fL   MCH 31.1  26.0 - 34.0 pg   MCHC 35.5  30.0 - 36.0 g/dL   RDW 30.8  65.7 - 84.6 %   Platelets 203  150 - 400 K/uL   Neutrophils Relative 67  43 - 77 %   Neutro Abs 7.3  1.7 - 7.7 K/uL   Lymphocytes Relative 20  12 - 46 %   Lymphs Abs 2.2  0.7 - 4.0 K/uL   Monocytes Relative 10  3 - 12 %   Monocytes Absolute 1.1 (*) 0.1 - 1.0 K/uL   Eosinophils Relative 2  0 - 5 %   Eosinophils Absolute 0.3  0.0 - 0.7 K/uL   Basophils Relative 0  0 - 1 %   Basophils Absolute 0.0  0.0 - 0.1 K/uL  D-DIMER, QUANTITATIVE      Result Value Range   D-Dimer, Quant 0.36  0.00 - 0.48 ug/mL-FEU  TROPONIN I      Result Value Range   Troponin I <0.30  <0.30 ng/mL  TROPONIN I      Result Value Range   Troponin I <0.30  <0.30 ng/mL  HEPARIN LEVEL (UNFRACTIONATED)      Result Value Range   Heparin Unfractionated 0.19 (*) 0.30 - 0.70 IU/mL  GLUCOSE, CAPILLARY      Result  Value Range   Glucose-Capillary 254 (*) 70 - 99 mg/dL  TSH      Result Value Range   TSH 1.165  0.350 - 4.500 uIU/mL  GLUCOSE, CAPILLARY      Result Value Range   Glucose-Capillary 297 (*) 70 - 99 mg/dL  HEPARIN LEVEL (UNFRACTIONATED)      Result Value Range   Heparin Unfractionated 0.26 (*) 0.30 - 0.70 IU/mL  GLUCOSE, CAPILLARY      Result Value Range   Glucose-Capillary 261 (*) 70 - 99 mg/dL  HEPARIN LEVEL (UNFRACTIONATED)      Result Value Range   Heparin Unfractionated 0.48  0.30 - 0.70 IU/mL  CBC      Result Value Range   WBC 9.3  4.0 - 10.5 K/uL   RBC 3.57 (*) 4.22 - 5.81 MIL/uL   Hemoglobin 11.1 (*) 13.0 - 17.0 g/dL   HCT 96.2 (*) 95.2 - 84.1 %   MCV 88.2  78.0 - 100.0 fL   MCH 31.1  26.0 - 34.0 pg   MCHC 35.2  30.0 - 36.0 g/dL   RDW 32.4  40.1 - 02.7 %   Platelets 155  150 - 400 K/uL  BASIC METABOLIC PANEL      Result Value Range   Sodium 135  135 - 145 mEq/L   Potassium 4.7  3.5 - 5.1 mEq/L   Chloride 106  96 - 112 mEq/L   CO2 19  19 - 32 mEq/L   Glucose, Bld 420 (*) 70 - 99 mg/dL   BUN 31 (*) 6 - 23 mg/dL   Creatinine, Ser 2.53 (*) 0.50 - 1.35 mg/dL   Calcium 8.8  8.4 - 66.4 mg/dL   GFR calc non Af Amer 47 (*) >90 mL/min   GFR calc Af Amer 55 (*) >90 mL/min  SEDIMENTATION RATE      Result Value Range   Sed Rate 42 (*) 0 - 16 mm/hr  GLUCOSE, CAPILLARY      Result Value Range   Glucose-Capillary 362 (*) 70 - 99 mg/dL   Comment 1 Notify RN     Comment 2 Documented in Chart    LIPID PANEL      Result Value Range   Cholesterol 173  0 - 200 mg/dL   Triglycerides 409  <811 mg/dL   HDL 45  >91 mg/dL   Total CHOL/HDL Ratio 3.8     VLDL 27  0 - 40 mg/dL   LDL Cholesterol 478 (*) 0 - 99 mg/dL  POCT I-STAT, CHEM 8      Result Value Range   Sodium 143  135 - 145 mEq/L   Potassium 4.0  3.5 - 5.1 mEq/L   Chloride 106  96 - 112 mEq/L   BUN 22  6 - 23 mg/dL   Creatinine, Ser 2.95  0.50 - 1.35 mg/dL   Glucose, Bld 621 (*) 70 - 99 mg/dL   Calcium, Ion 3.08  6.57  - 1.30 mmol/L   TCO2 26  0 - 100 mmol/L   Hemoglobin 13.3  13.0 - 17.0 g/dL   HCT 84.6  96.2 - 95.2 %  POCT I-STAT TROPONIN I      Result Value Range   Troponin i, poc 0.00  0.00 - 0.08 ng/mL   Comment 3             Time coordinating discharge: 37 minutes  Signed: Pearla Dubonnet, MD 11/17/2012, 7:58 AM

## 2012-11-17 NOTE — Progress Notes (Signed)
  Echocardiogram 2D Echocardiogram has been performed.  Jorje Guild 11/17/2012, 9:29 AM

## 2012-11-17 NOTE — Progress Notes (Signed)
ANTICOAGULATION CONSULT NOTE - Follow Up Consult  Pharmacy Consult for heparin Indication: atrial fibrillation  Allergies  Allergen Reactions  . Iohexol      Desc: HIVES S/P 13HR.PREMEDS,PT WEIGHS 367LBS,?LOW DOSAGE PREMEDS     Patient Measurements: Height: 5\' 9"  (175.3 cm) Weight: 266 lb 1.5 oz (120.7 kg) IBW/kg (Calculated) : 70.7 Heparin Dosing Weight: 100 kg  Vital Signs: Temp: 97.8 F (36.6 C) (04/09 0616) Temp src: Oral (04/09 0616) BP: 139/74 mmHg (04/09 0616) Pulse Rate: 78 (04/09 0616)  Labs:  Recent Labs  11/16/12 0040 11/16/12 0053 11/16/12 1219 11/16/12 1734 11/16/12 1952 11/17/12 0620  HGB 13.5 13.3  --   --   --   --   HCT 38.0* 39.0  --   --   --   --   PLT 203  --   --   --   --   --   HEPARINUNFRC  --   --  0.19*  --  0.26* 0.48  CREATININE  --  1.30  --   --   --   --   TROPONINI  --   --  <0.30 <0.30  --   --     Estimated Creatinine Clearance: 70.7 ml/min (by C-G formula based on Cr of 1.3).   Medications:  Scheduled:  . aspirin EC  325 mg Oral Daily  . diltiazem  30 mg Oral Q6H  . [COMPLETED] diphenhydrAMINE  50 mg Oral NOW  . febuxostat  80 mg Oral Daily  . glyBURIDE  5 mg Oral BID WC  . [COMPLETED] heparin  4,000 Units Intravenous Once  . insulin aspart  0-15 Units Subcutaneous TID WC  . insulin aspart  0-5 Units Subcutaneous QHS  . loratadine  10 mg Oral Daily  . pantoprazole  40 mg Oral Daily  . [COMPLETED] predniSONE  50 mg Oral Once  . [COMPLETED] predniSONE  50 mg Oral Once  . [COMPLETED] predniSONE  50 mg Oral Once  . terazosin  5 mg Oral QHS  . [DISCONTINUED] diphenhydrAMINE  50 mg Oral Once  . [DISCONTINUED] insulin aspart  0-15 Units Subcutaneous TID WC  . [DISCONTINUED] predniSONE  50 mg Oral Q6H   Infusions:  . [EXPIRED] sodium chloride 75 mL/hr at 11/16/12 0900  . heparin 2,000 Units/hr (11/16/12 2050)  . [DISCONTINUED] diltiazem (CARDIZEM) infusion 3 mg/hr (11/16/12 1015)    Assessment: Heparin for new  onset-Afib. Also concern for PE due to flight to [facility] few weeks ago vs pericarditis. Per Chest CTA, no evidence of significant pulmonary embolus. Heparin level (0.48) is at-goal on 2000 units/hr.   Goal of Therapy:  Heparin level 0.3-0.7 units/ml Monitor platelets by anticoagulation protocol: Yes   Plan:  1. Continue IV heparin at 2000 units/hr. 2. Heparin level in 6 hours to confirm dosing.   Emeline Gins 11/17/2012,6:49 AM

## 2012-11-17 NOTE — Progress Notes (Signed)
Pt has home CPAP machine in room. States he has everything he needs to wear CPAP tonight and machine is approved for hospital use. RT will continue to monitor.

## 2012-11-17 NOTE — Progress Notes (Signed)
ANTICOAGULATION CONSULT NOTE - Follow Up Consult  Pharmacy Consult for Heparin Indication: atrial fibrillation  Heparin Dosing Weight: 100 kg  Labs:  Recent Labs  11/16/12 0040 11/16/12 0053 11/16/12 1219 11/16/12 1952 11/17/12 0620  HGB 13.5 13.3  --   --  11.1*  HCT 38.0* 39.0  --   --  31.5*  PLT 203  --   --   --  155  HEPARINUNFRC  --   --  0.19* 0.26* 0.48  CREATININE  --  1.30  --   --  1.48*   Medications:  Scheduled:  . aspirin EC  325 mg Oral Daily  . diltiazem  180 mg Oral Daily  . febuxostat  80 mg Oral Daily  . glyBURIDE  5 mg Oral BID WC  . insulin aspart  0-15 Units Subcutaneous TID WC  . insulin aspart  0-5 Units Subcutaneous QHS  . loratadine  10 mg Oral Daily  . [COMPLETED] off the beat book   Does not apply Once  . pantoprazole  40 mg Oral Daily  . [COMPLETED] predniSONE  50 mg Oral Once  . [COMPLETED] predniSONE  50 mg Oral Once  . terazosin  5 mg Oral QHS  . [DISCONTINUED] diltiazem  30 mg Oral Q6H   Infusions:  . [EXPIRED] sodium chloride 75 mL/hr at 11/16/12 0900  . heparin    . [DISCONTINUED] heparin 2,000 Units/hr (11/16/12 2050)   Assessment:  67 y/o male on Heparin for new onset-atrial fibrillation.  Patient initially planned for discharge today and Heparin discontinued.  Discharge cancelled and Heparin ordered to be restarted per protocol.  Last Heparin level 0.48 units/ml on Heparin rate of 2000 units/hr. No bleeding complications documented.  Goal of Therapy:   Heparin level 0.3 - 0.7   Plan:  Restart Heparin at previous rate, 2000 units/hr  6 hour Hep Level (Goal 0.3 - 0.7)    Daily Heparin level and CBC while on Heparin.   Matas Burrows, Deetta Perla.D 11/17/2012, 4:51 PM

## 2012-11-18 LAB — CBC
Platelets: 171 10*3/uL (ref 150–400)
RBC: 3.88 MIL/uL — ABNORMAL LOW (ref 4.22–5.81)
RDW: 14.3 % (ref 11.5–15.5)
WBC: 11 10*3/uL — ABNORMAL HIGH (ref 4.0–10.5)

## 2012-11-18 LAB — GLUCOSE, CAPILLARY: Glucose-Capillary: 134 mg/dL — ABNORMAL HIGH (ref 70–99)

## 2012-11-18 MED ORDER — RIVAROXABAN 20 MG PO TABS
20.0000 mg | ORAL_TABLET | Freq: Every day | ORAL | Status: DC
  Administered 2012-11-18: 09:00:00 20 mg via ORAL
  Filled 2012-11-18: qty 1

## 2012-11-18 MED ORDER — DILTIAZEM HCL ER COATED BEADS 360 MG PO CP24: 360.0000 mg | ORAL_CAPSULE | Freq: Every day | ORAL | Status: DC

## 2012-11-18 MED ORDER — DILTIAZEM HCL ER COATED BEADS 360 MG PO CP24
360.0000 mg | ORAL_CAPSULE | Freq: Every day | ORAL | Status: DC
  Administered 2012-11-18: 360 mg via ORAL
  Filled 2012-11-18: qty 1

## 2012-11-18 MED ORDER — RIVAROXABAN 20 MG PO TABS: 20.0000 mg | ORAL_TABLET | Freq: Every day | ORAL | Status: DC

## 2012-11-18 NOTE — Discharge Summary (Signed)
Physician Discharge Summary  NAME:Oscar [name]  ZOX:[id number]  DOB: 20-Oct-1945   Admit date: 11/16/2012 Discharge date: 11/18/2012  Discharge Diagnoses:  Principal Problem:  Chest pain - resolved with IV Toradol - question etiology. 2-D echocardiogram without pericardial fluid. Sedimentation rate 42. No clinically measurable pericarditis  Atrial fibrillation with RVR - rate now controlled. Cardizem increased. Not able to stay in sinus rhythm yesterday.  Start anticoagulation with Xarelto 20 mg daily  Active Problems:  Gout - asymptomatic currently  Diabetes mellitus type 2, uncontrolled, with complications - hyperglycemia yesterday after oral prednisone is improving today with CBG less than 200. Can resume metformin today CKD (chronic kidney disease), stage II  Pleuritic chest pain - resolved with IV Toradol and, as above  Morbid obesity - continue attempts at reducing ingested calories - recommend 1800 calorie diet  Obstructive sleep apnea - continue CPAP at home.  Anticoagulation therapy - will start Xarelto 20 milligrams daily  Disposition: Discharge home today on Cardizem SR 360 milligrams daily and will start Xarelto 20 mg daily and see Dr. Gala Romney in 1 week   Discharge Physical Exam:  General Appearance: Alert, cooperative, no distress, appears stated age  Weight change: 0.5 kg (1 lb 1.6 oz)  Intake/Output Summary (Last 24 hours) at 11/18/12 0820 Last data filed at 11/18/12 8119  Gross per 24 hour  Intake 1486.7 ml  Output      0 ml  Net 1486.7 ml   Filed Vitals:   11/17/12 1946 11/17/12 2140 11/17/12 2336 11/18/12 0558  BP: 109/68 113/53 92/49 118/48  Pulse: 89  83 77  Temp: 97.9 F (36.6 C)  97.6 F (36.4 C) 97.8 F (36.6 C)  TempSrc: Oral  Oral Oral  Resp:      Height:      Weight:    121.2 kg (267 lb 3.2 oz)  SpO2: 98%  96% 93%   Neck: No JVD or bruits  Lungs: Clear to auscultation bilaterally, respirations unlabored  Heart: Irregular rhythm, S1 and S2  normal, no murmur, rub or gallop. Rate controlled  Abdomen: Soft, non-tender, bowel sounds active all four quadrants, no masses, no organomegaly  Extremities: Extremities normal, atraumatic, no cyanosis or edema  Neuro: Alert and oriented nonfocal  Discharge Condition: Improved.  Chest pain resolved and rate controlled  Hospital Course: Oscar [name] is a very pleasant 67 year old male with a history of hypertension, morbid obesity, type 2 diabetes mellitus, obstructive sleep apnea on CPAP, and GERD as well as gout.  Has never been able to be compliant with diet to lose weight.  Patient was admitted 2 days ago after experiencing gradual onset of inspiratory chest pain starting around 8 PM the night before.  He thought he was having a muscle spasm in the center of his chest.  The pain tended to radiate into his left lower neck.  He did not complain of shortness of breath or nausea or diaphoresis.  He took an aspirin and came to the emergency room at [facility] and was found to be in rapid atrial fibrillation/atrial flutter and was placed on IV diltiazem and had spontaneous conversion to normal sinus rhythm.  He was started on diltiazem 180 milligrams daily and just prior to be discharged yesterday, he developed atrial fibrillation again with a rapid ventricular response and he is now under good rate control with 360 milligrams of Cardizem over the past 24 hours. Because of chest pain and negative troponins, he had a CT angiogram after  several doses of prednisone because of contrast allergy and CT angiogram of the chest revealed no PE.  Chest pain was treated with IV Toradol on admission and chest pain resolved.  Essentially normal d-dimer.  He never remains in atrial fibrillation with rate control.  Echocardiogram are revealed some LVH with grade 2 diastolic dysfunction but otherwise was a normal echocardiogram. Anticoagulation therapy has been instituted with IV heparin and he will be  switched to Xarelto 20 milligrams daily.  He did not want to take Coumadin.  He will see Dr. Gala Romney in one week for followup.  He was seen briefly in consultation by Dr. Lewayne Bunting who recommended rate control and anticoagulation for now for his atrial fibrillation  Things to follow up in the outpatient setting: Heart rate and symptoms of shortness of breath or chest pain and monitor for signs of bleeding such as hematuria or excessive bruising  Consults: Treatment Team:  Marden Noble, MD [facility], MD - Dr. Lewayne Bunting and Dr. Nicholes Mango  Disposition:   Discharge Orders   Future Orders Complete By Expires     Call MD for:  difficulty breathing, headache or visual disturbances  As directed     Call MD for:  difficulty breathing, headache or visual disturbances  As directed     Call MD for:  extreme fatigue  As directed     Call MD for:  persistant nausea and vomiting  As directed     Call MD for:  persistant nausea and vomiting  As directed     Call MD for:  severe uncontrolled pain  As directed     Call MD for:  temperature >100.4  As directed     Call MD for:  temperature >100.4  As directed     Diet - low sodium heart healthy  As directed     Diet - low sodium heart healthy  As directed     Increase activity slowly  As directed     Increase activity slowly  As directed         Medication List    TAKE these medications       ALLEGRA PO  Take 1 tablet by mouth daily.     aspirin 325 MG EC tablet  Take 1 tablet (325 mg total) by mouth daily.     diltiazem 360 MG 24 hr capsule  Commonly known as:  CARDIZEM CD  Take 1 capsule (360 mg total) by mouth daily.     febuxostat 40 MG tablet  Commonly known as:  ULORIC  Take 80 mg by mouth daily.     glyBURIDE 5 MG tablet  Commonly known as:  DIABETA  Take 5 mg by mouth 2 (two) times daily with a meal.     metFORMIN 1000 MG tablet  Commonly known as:  GLUCOPHAGE  Take 1,000 mg by mouth 2 (two) times daily  with a meal.     pantoprazole 40 MG tablet  Commonly known as:  PROTONIX  Take 1 tablet (40 mg total) by mouth daily.     Rivaroxaban 20 MG Tabs  Commonly known as:  XARELTO  Take 1 tablet (20 mg total) by mouth daily.     terazosin 5 MG capsule  Commonly known as:  HYTRIN  Take 5 mg by mouth at bedtime.         The results of significant diagnostics from this hospitalization (including imaging, microbiology, ancillary and laboratory) are listed below for reference.  Significant Diagnostic Studies: Ct Angio Chest Pe W/cm &/or Wo Cm  11/17/2012  *RADIOLOGY REPORT*  Clinical Data: Chest pain with inspiration.  CT ANGIOGRAPHY CHEST  Technique:  Multidetector CT imaging of the chest using the standard protocol during bolus administration of intravenous contrast. Multiplanar reconstructed images including MIPs were obtained and reviewed to evaluate the vascular anatomy.  Contrast: OMNIPAQUE IOHEXOL 350 MG/ML SOLN  Comparison: Chest radiograph performed 06/10/2010  Findings: There is no evidence of significant pulmonary embolus.  Mild patchy bibasilar atelectasis is noted.  Evaluation is suboptimal due to motion artifact.  The lungs are otherwise clear. There is no evidence of significant focal consolidation, pleural effusion or pneumothorax.  No masses are identified; no abnormal focal contrast enhancement is seen.  Diffuse coronary artery calcification is noted.  The mediastinum is otherwise unremarkable in appearance.  No mediastinal lymphadenopathy is seen.  No pericardial effusion is identified. The great vessels are grossly unremarkable in appearance.  No axillary lymphadenopathy is seen.  The visualized portions of the thyroid gland are unremarkable in appearance.  The visualized portions of the liver are unremarkable.  The spleen is enlarged, measuring 16.3 cm in length.  No acute osseous abnormalities are seen.  IMPRESSION:  1.  No evidence of significant pulmonary embolus. 2.  Mild  patchy bibasilar atelectasis noted; lungs otherwise clear. 3.  Diffuse coronary artery calcifications seen. 4.  Splenomegaly noted.   Original Report Authenticated By: Tonia Ghent, M.D.     Microbiology: No results found for this or any previous visit (from the past 240 hour(s)).   Labs: Results for orders placed during the hospital encounter of 11/16/12  CBC WITH DIFFERENTIAL      Result Value Range   WBC 10.9 (*) 4.0 - 10.5 K/uL   RBC 4.34  4.22 - 5.81 MIL/uL   Hemoglobin 13.5  13.0 - 17.0 g/dL   HCT 16.1 (*) 09.6 - 04.5 %   MCV 87.6  78.0 - 100.0 fL   MCH 31.1  26.0 - 34.0 pg   MCHC 35.5  30.0 - 36.0 g/dL   RDW 40.9  81.1 - 91.4 %   Platelets 203  150 - 400 K/uL   Neutrophils Relative 67  43 - 77 %   Neutro Abs 7.3  1.7 - 7.7 K/uL   Lymphocytes Relative 20  12 - 46 %   Lymphs Abs 2.2  0.7 - 4.0 K/uL   Monocytes Relative 10  3 - 12 %   Monocytes Absolute 1.1 (*) 0.1 - 1.0 K/uL   Eosinophils Relative 2  0 - 5 %   Eosinophils Absolute 0.3  0.0 - 0.7 K/uL   Basophils Relative 0  0 - 1 %   Basophils Absolute 0.0  0.0 - 0.1 K/uL  D-DIMER, QUANTITATIVE      Result Value Range   D-Dimer, Quant 0.36  0.00 - 0.48 ug/mL-FEU  TROPONIN I      Result Value Range   Troponin I <0.30  <0.30 ng/mL  TROPONIN I      Result Value Range   Troponin I <0.30  <0.30 ng/mL  HEPARIN LEVEL (UNFRACTIONATED)      Result Value Range   Heparin Unfractionated 0.19 (*) 0.30 - 0.70 IU/mL  GLUCOSE, CAPILLARY      Result Value Range   Glucose-Capillary 254 (*) 70 - 99 mg/dL  TSH      Result Value Range   TSH 1.165  0.350 - 4.500 uIU/mL  GLUCOSE, CAPILLARY  Result Value Range   Glucose-Capillary 297 (*) 70 - 99 mg/dL  HEPARIN LEVEL (UNFRACTIONATED)      Result Value Range   Heparin Unfractionated 0.26 (*) 0.30 - 0.70 IU/mL  GLUCOSE, CAPILLARY      Result Value Range   Glucose-Capillary 261 (*) 70 - 99 mg/dL  HEPARIN LEVEL (UNFRACTIONATED)      Result Value Range   Heparin Unfractionated  0.48  0.30 - 0.70 IU/mL  CBC      Result Value Range   WBC 9.3  4.0 - 10.5 K/uL   RBC 3.57 (*) 4.22 - 5.81 MIL/uL   Hemoglobin 11.1 (*) 13.0 - 17.0 g/dL   HCT 96.0 (*) 45.4 - 09.8 %   MCV 88.2  78.0 - 100.0 fL   MCH 31.1  26.0 - 34.0 pg   MCHC 35.2  30.0 - 36.0 g/dL   RDW 11.9  14.7 - 82.9 %   Platelets 155  150 - 400 K/uL  BASIC METABOLIC PANEL      Result Value Range   Sodium 135  135 - 145 mEq/L   Potassium 4.7  3.5 - 5.1 mEq/L   Chloride 106  96 - 112 mEq/L   CO2 19  19 - 32 mEq/L   Glucose, Bld 420 (*) 70 - 99 mg/dL   BUN 31 (*) 6 - 23 mg/dL   Creatinine, Ser 5.62 (*) 0.50 - 1.35 mg/dL   Calcium 8.8  8.4 - 13.0 mg/dL   GFR calc non Af Amer 47 (*) >90 mL/min   GFR calc Af Amer 55 (*) >90 mL/min  SEDIMENTATION RATE      Result Value Range   Sed Rate 42 (*) 0 - 16 mm/hr  HEMOGLOBIN A1C      Result Value Range   Hemoglobin A1C 6.4 (*) <5.7 %   Mean Plasma Glucose 137 (*) <117 mg/dL  GLUCOSE, CAPILLARY      Result Value Range   Glucose-Capillary 362 (*) 70 - 99 mg/dL   Comment 1 Notify RN     Comment 2 Documented in Chart    LIPID PANEL      Result Value Range   Cholesterol 173  0 - 200 mg/dL   Triglycerides 865  <784 mg/dL   HDL 45  >69 mg/dL   Total CHOL/HDL Ratio 3.8     VLDL 27  0 - 40 mg/dL   LDL Cholesterol 629 (*) 0 - 99 mg/dL  GLUCOSE, CAPILLARY      Result Value Range   Glucose-Capillary 368 (*) 70 - 99 mg/dL  GLUCOSE, CAPILLARY      Result Value Range   Glucose-Capillary 357 (*) 70 - 99 mg/dL  HEPARIN LEVEL (UNFRACTIONATED)      Result Value Range   Heparin Unfractionated 0.22 (*) 0.30 - 0.70 IU/mL  GLUCOSE, CAPILLARY      Result Value Range   Glucose-Capillary 333 (*) 70 - 99 mg/dL  GLUCOSE, CAPILLARY      Result Value Range   Glucose-Capillary 199 (*) 70 - 99 mg/dL   Comment 1 Notify RN     Comment 2 Documented in Chart    POCT I-STAT, CHEM 8      Result Value Range   Sodium 143  135 - 145 mEq/L   Potassium 4.0  3.5 - 5.1 mEq/L   Chloride  106  96 - 112 mEq/L   BUN 22  6 - 23 mg/dL   Creatinine, Ser 5.28  0.50 - 1.35  mg/dL   Glucose, Bld 409 (*) 70 - 99 mg/dL   Calcium, Ion 8.11  9.14 - 1.30 mmol/L   TCO2 26  0 - 100 mmol/L   Hemoglobin 13.3  13.0 - 17.0 g/dL   HCT 78.2  95.6 - 21.3 %  POCT I-STAT TROPONIN I      Result Value Range   Troponin i, poc 0.00  0.00 - 0.08 ng/mL   Comment 3             Time coordinating discharge: 45 minutes  Signed: Pearla Dubonnet, MD 11/18/2012, 8:20 AM

## 2012-11-18 NOTE — Progress Notes (Signed)
Subjective: Oscar [name] is feeling okay today.  We will try to discharge him yesterday but he went back into atrial fibrillation with an accelerated ventricular response.  He was given an extra dose of Cardizem CD 180 milligrams last night in addition to a dose yesterday morning and heart rate is controlled but he is still in atrial fibrillation.  Dr. Lewayne Bunting consulted today he recommends anticoagulation therapy and rate control and followup with Dr. Gala Romney in one week  Objective: Weight change: 0.5 kg (1 lb 1.6 oz)  Intake/Output Summary (Last 24 hours) at 11/18/12 0743 Last data filed at 11/18/12 0624  Gross per 24 hour  Intake 1486.7 ml  Output      0 ml  Net 1486.7 ml   Filed Vitals:   11/17/12 1946 11/17/12 2140 11/17/12 2336 11/18/12 0558  BP: 109/68 113/53 92/49 118/48  Pulse: 89  83 77  Temp: 97.9 F (36.6 C)  97.6 F (36.4 C) 97.8 F (36.6 C)  TempSrc: Oral  Oral Oral  Resp:      Height:      Weight:    121.2 kg (267 lb 3.2 oz)  SpO2: 98%  96% 93%   Neck: No JVD or bruits  Lungs: Clear to auscultation bilaterally, respirations unlabored  Heart: Irregular rhythm, S1 and S2 normal, no murmur, rub or gallop.  Rate controlled  Abdomen: Soft, non-tender, bowel sounds active all four quadrants, no masses, no organomegaly  Extremities: Extremities normal, atraumatic, no cyanosis or edema  Neuro: Alert and oriented nonfocal   Lab Results: Results for orders placed during the hospital encounter of 11/16/12 (from the past 48 hour(s))  GLUCOSE, CAPILLARY     Status: Abnormal   Collection Time    11/16/12  9:03 AM      Result Value Range   Glucose-Capillary 254 (*) 70 - 99 mg/dL  TROPONIN I     Status: None   Collection Time    11/16/12 12:19 PM      Result Value Range   Troponin I <0.30  <0.30 ng/mL   Comment:            Due to the release kinetics of cTnI,     a negative result within the first hours     of the onset of symptoms does not rule out      myocardial infarction with certainty.     If myocardial infarction is still suspected,     repeat the test at appropriate intervals.  HEPARIN LEVEL (UNFRACTIONATED)     Status: Abnormal   Collection Time    11/16/12 12:19 PM      Result Value Range   Heparin Unfractionated 0.19 (*) 0.30 - 0.70 IU/mL   Comment:            IF HEPARIN RESULTS ARE BELOW     EXPECTED VALUES, AND PATIENT     DOSAGE HAS BEEN CONFIRMED,     SUGGEST FOLLOW UP TESTING     OF ANTITHROMBIN III LEVELS.  TSH     Status: None   Collection Time    11/16/12 12:19 PM      Result Value Range   TSH 1.165  0.350 - 4.500 uIU/mL  GLUCOSE, CAPILLARY     Status: Abnormal   Collection Time    11/16/12 12:36 PM      Result Value Range   Glucose-Capillary 297 (*) 70 - 99 mg/dL  GLUCOSE, CAPILLARY     Status: Abnormal  Collection Time    11/16/12  3:58 PM      Result Value Range   Glucose-Capillary 261 (*) 70 - 99 mg/dL  TROPONIN I     Status: None   Collection Time    11/16/12  5:34 PM      Result Value Range   Troponin I <0.30  <0.30 ng/mL   Comment:            Due to the release kinetics of cTnI,     a negative result within the first hours     of the onset of symptoms does not rule out     myocardial infarction with certainty.     If myocardial infarction is still suspected,     repeat the test at appropriate intervals.  HEPARIN LEVEL (UNFRACTIONATED)     Status: Abnormal   Collection Time    11/16/12  7:52 PM      Result Value Range   Heparin Unfractionated 0.26 (*) 0.30 - 0.70 IU/mL   Comment:            IF HEPARIN RESULTS ARE BELOW     EXPECTED VALUES, AND PATIENT     DOSAGE HAS BEEN CONFIRMED,     SUGGEST FOLLOW UP TESTING     OF ANTITHROMBIN III LEVELS.  GLUCOSE, CAPILLARY     Status: Abnormal   Collection Time    11/16/12 10:16 PM      Result Value Range   Glucose-Capillary 362 (*) 70 - 99 mg/dL   Comment 1 Notify RN     Comment 2 Documented in Chart    HEPARIN LEVEL (UNFRACTIONATED)      Status: None   Collection Time    11/17/12  6:20 AM      Result Value Range   Heparin Unfractionated 0.48  0.30 - 0.70 IU/mL   Comment:            IF HEPARIN RESULTS ARE BELOW     EXPECTED VALUES, AND PATIENT     DOSAGE HAS BEEN CONFIRMED,     SUGGEST FOLLOW UP TESTING     OF ANTITHROMBIN III LEVELS.  CBC     Status: Abnormal   Collection Time    11/17/12  6:20 AM      Result Value Range   WBC 9.3  4.0 - 10.5 K/uL   RBC 3.57 (*) 4.22 - 5.81 MIL/uL   Hemoglobin 11.1 (*) 13.0 - 17.0 g/dL   HCT 16.1 (*) 09.6 - 04.5 %   MCV 88.2  78.0 - 100.0 fL   MCH 31.1  26.0 - 34.0 pg   MCHC 35.2  30.0 - 36.0 g/dL   RDW 40.9  81.1 - 91.4 %   Platelets 155  150 - 400 K/uL   Comment: REPEATED TO VERIFY     DELTA CHECK NOTED  BASIC METABOLIC PANEL     Status: Abnormal   Collection Time    11/17/12  6:20 AM      Result Value Range   Sodium 135  135 - 145 mEq/L   Potassium 4.7  3.5 - 5.1 mEq/L   Chloride 106  96 - 112 mEq/L   CO2 19  19 - 32 mEq/L   Glucose, Bld 420 (*) 70 - 99 mg/dL   BUN 31 (*) 6 - 23 mg/dL   Creatinine, Ser 7.82 (*) 0.50 - 1.35 mg/dL   Calcium 8.8  8.4 - 95.6 mg/dL   GFR calc non Af [name]  47 (*) >90 mL/min   GFR calc Af Amer 55 (*) >90 mL/min   Comment:            The eGFR has been calculated     using the CKD EPI equation.     This calculation has not been     validated in all clinical     situations.     eGFR's persistently     <90 mL/min signify     possible Chronic Kidney Disease.  SEDIMENTATION RATE     Status: Abnormal   Collection Time    11/17/12  6:20 AM      Result Value Range   Sed Rate 42 (*) 0 - 16 mm/hr  HEMOGLOBIN A1C     Status: Abnormal   Collection Time    11/17/12  6:20 AM      Result Value Range   Hemoglobin A1C 6.4 (*) <5.7 %   Comment: (NOTE)                                                                               According to the ADA Clinical Practice Recommendations for 2011, when     HbA1c is used as a screening test:       >=6.5%   Diagnostic of Diabetes Mellitus               (if abnormal result is confirmed)     5.7-6.4%   Increased risk of developing Diabetes Mellitus     References:Diagnosis and Classification of Diabetes Mellitus,Diabetes     Care,2011,34(Suppl 1):S62-S69 and Standards of Medical Care in             Diabetes - 2011,Diabetes Care,2011,34 (Suppl 1):S11-S61.   Mean Plasma Glucose 137 (*) <117 mg/dL  LIPID PANEL     Status: Abnormal   Collection Time    11/17/12  6:20 AM      Result Value Range   Cholesterol 173  0 - 200 mg/dL   Triglycerides 454  <098 mg/dL   HDL 45  >11 mg/dL   Total CHOL/HDL Ratio 3.8     VLDL 27  0 - 40 mg/dL   LDL Cholesterol 914 (*) 0 - 99 mg/dL   Comment:            Total Cholesterol/HDL:CHD Risk     Coronary Heart Disease Risk Table                         Men   Women      1/2 Average Risk   3.4   3.3      Average Risk       5.0   4.4      2 X Average Risk   9.6   7.1      3 X Average Risk  23.4   11.0                Use the calculated Patient Ratio     above and the CHD Risk Table     to determine the patient's CHD Risk.  ATP III CLASSIFICATION (LDL):      <100     mg/dL   Optimal      161-096  mg/dL   Near or Above                        Optimal      130-159  mg/dL   Borderline      045-409  mg/dL   High      >811     mg/dL   Very High  GLUCOSE, CAPILLARY     Status: Abnormal   Collection Time    11/17/12  8:00 AM      Result Value Range   Glucose-Capillary 368 (*) 70 - 99 mg/dL  GLUCOSE, CAPILLARY     Status: Abnormal   Collection Time    11/17/12 12:51 PM      Result Value Range   Glucose-Capillary 357 (*) 70 - 99 mg/dL  GLUCOSE, CAPILLARY     Status: Abnormal   Collection Time    11/17/12  5:58 PM      Result Value Range   Glucose-Capillary 333 (*) 70 - 99 mg/dL  GLUCOSE, CAPILLARY     Status: Abnormal   Collection Time    11/17/12  9:20 PM      Result Value Range   Glucose-Capillary 199 (*) 70 - 99 mg/dL   Comment 1  Notify RN     Comment 2 Documented in Chart    HEPARIN LEVEL (UNFRACTIONATED)     Status: Abnormal   Collection Time    11/17/12 11:22 PM      Result Value Range   Heparin Unfractionated 0.22 (*) 0.30 - 0.70 IU/mL   Comment:            IF HEPARIN RESULTS ARE BELOW     EXPECTED VALUES, AND PATIENT     DOSAGE HAS BEEN CONFIRMED,     SUGGEST FOLLOW UP TESTING     OF ANTITHROMBIN III LEVELS.    Studies/Results: Ct Angio Chest Pe W/cm &/or Wo Cm  11/17/2012  *RADIOLOGY REPORT*  Clinical Data: Chest pain with inspiration.  CT ANGIOGRAPHY CHEST  Technique:  Multidetector CT imaging of the chest using the standard protocol during bolus administration of intravenous contrast. Multiplanar reconstructed images including MIPs were obtained and reviewed to evaluate the vascular anatomy.  Contrast: OMNIPAQUE IOHEXOL 350 MG/ML SOLN  Comparison: Chest radiograph performed 06/10/2010  Findings: There is no evidence of significant pulmonary embolus.  Mild patchy bibasilar atelectasis is noted.  Evaluation is suboptimal due to motion artifact.  The lungs are otherwise clear. There is no evidence of significant focal consolidation, pleural effusion or pneumothorax.  No masses are identified; no abnormal focal contrast enhancement is seen.  Diffuse coronary artery calcification is noted.  The mediastinum is otherwise unremarkable in appearance.  No mediastinal lymphadenopathy is seen.  No pericardial effusion is identified. The great vessels are grossly unremarkable in appearance.  No axillary lymphadenopathy is seen.  The visualized portions of the thyroid gland are unremarkable in appearance.  The visualized portions of the liver are unremarkable.  The spleen is enlarged, measuring 16.3 cm in length.  No acute osseous abnormalities are seen.  IMPRESSION:  1.  No evidence of significant pulmonary embolus. 2.  Mild patchy bibasilar atelectasis noted; lungs otherwise clear. 3.  Diffuse coronary artery calcifications  seen. 4.  Splenomegaly noted.   Original Report Authenticated By: Tonia Ghent, M.D.  Medications: Scheduled Meds: . aspirin EC  325 mg Oral Daily  . diltiazem  360 mg Oral Daily  . febuxostat  80 mg Oral Daily  . glyBURIDE  5 mg Oral BID WC  . insulin aspart  0-15 Units Subcutaneous TID WC  . insulin aspart  0-5 Units Subcutaneous QHS  . loratadine  10 mg Oral Daily  . pantoprazole  40 mg Oral Daily  . terazosin  5 mg Oral QHS   Continuous Infusions: . heparin 2,100 Units/hr (11/18/12 0345)   PRN Meds:.acetaminophen, acetaminophen, morphine injection, nitroGLYCERIN, ondansetron (ZOFRAN) IV, ondansetron  Assessment/Plan:  Principal Problem:  Chest pain - resolved with IV Toradol - question etiology. 2-D echocardiogram without pericardial fluid. Sedimentation rate 42.  No clinically measurable pericarditis  Atrial fibrillation with RVR - rate now controlled.  Cardizem increased. Not able to stay in sinus rhythm yesterday Active Problems:  Gout - asymptomatic currently  Diabetes mellitus type 2, uncontrolled, with complications - hyperglycemia yesterday after oral prednisone is improving today with CBG less than 200.  Can resume metformin today CKD (chronic kidney disease), stage II  Pleuritic chest pain - resolved with IV Toradol and, as above  Morbid obesity - continue attempts at reducing ingested calories - recommend 1800 calorie diet Obstructive sleep apnea - continue CPAP at home. Anticoagulation therapy - will start Coumadin 5 milligrams daily and check PT/INR in 5 days  Disposition:  Discharge home today on Cardizem SR 360 milligrams daily and will start Xarelto 20 mg daily and see Dr. Gala Romney in 1 week    LOS: 2 days   Pearla Dubonnet, MD 11/18/2012, 7:43 AM

## 2012-11-18 NOTE — Progress Notes (Signed)
ANTICOAGULATION CONSULT NOTE - Follow Up Consult  Pharmacy Consult for Heparin Indication: atrial fibrillation  Heparin Dosing Weight: 100 kg  Labs:  Recent Labs  11/16/12 0040 11/16/12 0053  11/16/12 1952 11/17/12 0620 11/17/12 2322  HGB 13.5 13.3  --   --  11.1*  --   HCT 38.0* 39.0  --   --  31.5*  --   PLT 203  --   --   --  155  --   HEPARINUNFRC  --   --   < > 0.26* 0.48 0.22*  CREATININE  --  1.30  --   --  1.48*  --   < > = values in this interval not displayed.  Assessment: 67 y/o male with new onset-atrial fibrillation for heparin  Goal of Therapy:  Heparin level 0.3 - 0.7   Plan: Increase Heparin 2100 units/hr Follow-up am labs.   Eddie Candle  11/18/2012, 12:04 AM

## 2012-11-18 NOTE — Care Management Note (Unsigned)
    Page 1 of 1   11/18/2012     9:59:11 AM   CARE MANAGEMENT NOTE 11/18/2012  Patient:  Oscar [name], Oscar [name]   Account Number:  [id number]  Date Initiated:  11/18/2012  Documentation initiated by:  [facility]  Subjective/Objective Assessment:   67 y.o. Caucasian male with history of diabetes, gout, and hypertension which is controlled through diet and exercise he presents with the above complaints.     Action/Plan:   home with spouse/ medicationa assistance   Anticipated DC Date:  11/18/2012   Anticipated DC Plan:  HOME/SELF CARE      DC Planning Services  CM consult      Choice offered to / List presented to:             Status of service:   Medicare Important Message given?   (If response is "NO", the following Medicare IM given date fields will be blank) Date Medicare IM given:   Date Additional Medicare IM given:    Discharge Disposition:    Per UR Regulation:    If discussed at Long Length of Stay Meetings, dates discussed:    Comments:  11/18/12.Marland KitchenMarland KitchenOletta Cohn, RN, BSN, [employer]  [phone] Spoke with pt and wife regarding medication assistance for Xarelto.  Presented pt with 10 day trial supply card and application for prescription assistance through [employer].

## 2012-11-18 NOTE — Consult Note (Signed)
ELECTROPHYSIOLOGY CONSULT NOTE    Patient ID: Oscar [name] MRN: [id number], DOB/AGE: August 29, 1945 67 y.o.  Admit date: 11/16/2012 Date of Consult: 11-18-2012  Primary Physician: Pearla Dubonnet, MD Primary Cardiologist: Nicholes Mango (new this admission)  Reason for Consultation: atrial arrhythmias  HPI:  Oscar [name] is a 67 year old male who presented to the ER on 11-16-2012 for chest pain.  He was found to be in an atypical flutter and was admitted.  Work up included CTA of chest which demonstrated no PE.  Echo demnstrated EF of 60% with pseudonormal diastolic dysfunction.  LA 48mm.  Cardiac enzymes have been normal.  Plans are for cardiac catheterization as an outpatient for further risk stratification.  He converted to SR on his own and was for discharge yesterday when he returned to atrial fibrillation.  His discharge was cancelled.  Past medical history is significant for diabetes, hypertension, GERD, sleep apnea (treated with CPAP).    He has no sensation of tachypalpitations.  He denies chest pain or shortness of breath prior to this admission.  He denies syncope or pre-syncope.    ROS is negative except as outlined above.   Past Medical History  Diagnosis Date  . Diabetes mellitus without complication     improved after diet/exercise  . Hypertension     improved after diet/exercise  . Kidney stones   . GERD (gastroesophageal reflux disease)   . H/O hiatal hernia   . Arthritis     hips  . OSA (obstructive sleep apnea)     wears cpap  . Anal fissure     h/o - no recent complications     Surgical History:  Past Surgical History  Procedure Laterality Date  . Cholecystectomy    . Knee arthroscopy    . Carpel tunnel    . Ligament repair    . Kidney stone surgery       Prescriptions prior to admission  Medication Sig Dispense Refill  . febuxostat (ULORIC) 40 MG tablet Take 80 mg by mouth daily.      Marland Kitchen Fexofenadine HCl (ALLEGRA PO) Take 1 tablet by mouth  daily.      Marland Kitchen glyBURIDE (DIABETA) 5 MG tablet Take 5 mg by mouth 2 (two) times daily with a meal.      . metFORMIN (GLUCOPHAGE) 1000 MG tablet Take 1,000 mg by mouth 2 (two) times daily with a meal.      . terazosin (HYTRIN) 5 MG capsule Take 5 mg by mouth at bedtime.        Inpatient Medications:  . aspirin EC  325 mg Oral Daily  . diltiazem  180 mg Oral Daily  . febuxostat  80 mg Oral Daily  . glyBURIDE  5 mg Oral BID WC  . insulin aspart  0-15 Units Subcutaneous TID WC  . insulin aspart  0-5 Units Subcutaneous QHS  . loratadine  10 mg Oral Daily  . pantoprazole  40 mg Oral Daily  . terazosin  5 mg Oral QHS    Allergies:  Allergies  Allergen Reactions  . Iohexol      Desc: HIVES S/P 13HR.PREMEDS,PT WEIGHS 367LBS,?LOW DOSAGE PREMEDS     History   Social History  . Marital Status: Married    Spouse Name: N/A    Number of Children: N/A  . Years of Education: N/A   Occupational History  . Not on file.   Social History Main Topics  . Smoking status: Never Smoker   .  Smokeless tobacco: Never Used  . Alcohol Use: Yes     Comment: rare - 1-2x /yr  . Drug Use: No  . Sexually Active: Not on file   Other Topics Concern  . Not on file   Social History Narrative  . No narrative on file     Family History  Problem Relation Age of Onset  . COPD Father   . Heart attack Sister     died age 29 of MI    Physical Exam Well appearing obese, middle aged man, NAD HEENT: Unremarkable Neck:  6 cm JVD, no thyromegally Lungs:  Clear with no wheezes HEART:  IRegular rate rhythm, no murmurs, no rubs, no clicks Abd:  obese, positive bowel sounds, no organomegally, no rebound, no guarding Ext:  2 plus pulses, no edema, no cyanosis, no clubbing Skin:  No rashes no nodules Neuro:  CN II through XII intact, motor grossly intact   Labs:   Lab Results  Component Value Date   WBC 9.3 11/17/2012   HGB 11.1* 11/17/2012   HCT 31.5* 11/17/2012   MCV 88.2 11/17/2012   PLT 155 11/17/2012      Recent Labs Lab 11/17/12 0620  NA 135  K 4.7  CL 106  CO2 19  BUN 31*  CREATININE 1.48*  CALCIUM 8.8  GLUCOSE 420*   Lab Results  Component Value Date   TROPONINI <0.30 11/16/2012   Lab Results  Component Value Date   CHOL 173 11/17/2012   Lab Results  Component Value Date   HDL 45 11/17/2012   Lab Results  Component Value Date   LDLCALC 101* 11/17/2012   Lab Results  Component Value Date   TRIG 137 11/17/2012   Lab Results  Component Value Date   CHOLHDL 3.8 11/17/2012   No results found for this basename: LDLDIRECT    Lab Results  Component Value Date   DDIMER 0.36 11/16/2012     Radiology/Studies: Ct Angio Chest Pe W/cm &/or Wo Cm 11/17/2012  *RADIOLOGY REPORT*  Clinical Data: Chest pain with inspiration.  CT ANGIOGRAPHY CHEST  Technique:  Multidetector CT imaging of the chest using the standard protocol during bolus administration of intravenous contrast. Multiplanar reconstructed images including MIPs were obtained and reviewed to evaluate the vascular anatomy.  Contrast: OMNIPAQUE IOHEXOL 350 MG/ML SOLN  Comparison: Chest radiograph performed 06/10/2010  Findings: There is no evidence of significant pulmonary embolus.  Mild patchy bibasilar atelectasis is noted.  Evaluation is suboptimal due to motion artifact.  The lungs are otherwise clear. There is no evidence of significant focal consolidation, pleural effusion or pneumothorax.  No masses are identified; no abnormal focal contrast enhancement is seen.  Diffuse coronary artery calcification is noted.  The mediastinum is otherwise unremarkable in appearance.  No mediastinal lymphadenopathy is seen.  No pericardial effusion is identified. The great vessels are grossly unremarkable in appearance.  No axillary lymphadenopathy is seen.  The visualized portions of the thyroid gland are unremarkable in appearance.  The visualized portions of the liver are unremarkable.  The spleen is enlarged, measuring 16.3 cm in length.  No  acute osseous abnormalities are seen.  IMPRESSION:  1.  No evidence of significant pulmonary embolus. 2.  Mild patchy bibasilar atelectasis noted; lungs otherwise clear. 3.  Diffuse coronary artery calcifications seen. 4.  Splenomegaly noted.   Original Report Authenticated By: Tonia Ghent, M.D.    EKG: atrial fibrillation  TELEMETRY: atrial fibrillation with controlled ventricular rate  A/P 1. Atrial flutter  2. Atrial fib 3. Chest pain, suspect pericarditis but no clear cut documentation Rec: At this point, rate control, anti-coagulation (he prefers not to use coumadin) and weight loss and salt restriction appear most appropriate for treatment.   Leonia Reeves.D.

## 2012-12-23 ENCOUNTER — Ambulatory Visit (INDEPENDENT_AMBULATORY_CARE_PROVIDER_SITE_OTHER): Payer: BC Managed Care – PPO | Admitting: Internal Medicine

## 2012-12-23 ENCOUNTER — Encounter: Payer: Self-pay | Admitting: Internal Medicine

## 2012-12-23 VITALS — BP 132/78 | HR 81 | Ht 69.0 in | Wt 276.8 lb

## 2012-12-23 DIAGNOSIS — I4891 Unspecified atrial fibrillation: Secondary | ICD-10-CM

## 2012-12-23 NOTE — Patient Instructions (Signed)
Your physician wants you to follow-up in: 6 months with Dr Taylor You will receive a reminder letter in the mail two months in advance. If you don't receive a letter, please call our office to schedule the follow-up appointment.  

## 2012-12-23 NOTE — Assessment & Plan Note (Signed)
The importance of weight loss has been outlined and discussed in detail with this patient. He states that he will try to use weight in the future.

## 2012-12-23 NOTE — Assessment & Plan Note (Signed)
Today we discussed the many aspects of treatment of his atrial fibrillation. He will continue anticoagulation. He is tolerating his Xarelto without problem. With regard to rhythm control versus rate control, he is asymptomatic and his rate is well controlled. Rhythm control might be an option, but he would certainly require antiarrhythmic drug therapy in conjunction with cardioversion. I think the risk of this strategy of rhythm control is much greater than any slight benefit from maintaining sinus rhythm. I discuss importance of weight loss as it pertains to his atrial arrhythmias.

## 2012-12-23 NOTE — Progress Notes (Signed)
HPI [name] returns today for followup. He is a very pleasant 67 year old man with morbid obesity and hypertension and diabetes. He was hospitalized several weeks ago with atrial fibrillation and a rapid ventricular response. The patient has been treated with rate control. He's also been anticoagulated.the patient has no knowledge that he is in atrial fibrillation. By his history, he is able to do just about everything he wants to. He denies chest pain or syncope. No palpitations. He has mild dyspnea with exertion. Allergies  Allergen Reactions  . Iohexol      Desc: HIVES S/P 13HR.PREMEDS,PT WEIGHS 367LBS,?LOW DOSAGE PREMEDS      Current Outpatient Prescriptions  Medication Sig Dispense Refill  . diltiazem (CARDIZEM CD) 360 MG 24 hr capsule Take 1 capsule (360 mg total) by mouth daily.  30 capsule  1  . febuxostat (ULORIC) 40 MG tablet Take 80 mg by mouth daily.      Marland Kitchen Fexofenadine HCl (ALLEGRA PO) Take 1 tablet by mouth daily.      Marland Kitchen glyBURIDE (DIABETA) 5 MG tablet Take 5 mg by mouth 2 (two) times daily with a meal.      . indomethacin (INDOCIN) 50 MG capsule Take 50 mg by mouth as needed.      . metFORMIN (GLUCOPHAGE) 1000 MG tablet Take 1,000 mg by mouth 2 (two) times daily with a meal.      . pantoprazole (PROTONIX) 40 MG tablet Take 1 tablet (40 mg total) by mouth daily.  30 tablet  1  . Rivaroxaban (XARELTO) 20 MG TABS Take 1 tablet (20 mg total) by mouth daily.  30 tablet  1  . terazosin (HYTRIN) 5 MG capsule Take 5 mg by mouth at bedtime.       No current facility-administered medications for this visit.     Past Medical History  Diagnosis Date  . Diabetes mellitus without complication     improved after diet/exercise  . Hypertension     improved after diet/exercise  . Kidney stones   . GERD (gastroesophageal reflux disease)   . H/O hiatal hernia   . Arthritis     hips  . OSA (obstructive sleep apnea)     wears cpap  . Anal fissure     h/o - no recent complications     ROS:   All systems reviewed and negative except as noted in the HPI.   Past Surgical History  Procedure Laterality Date  . Cholecystectomy    . Knee arthroscopy    . Carpel tunnel    . Ligament repair    . Kidney stone surgery       Family History  Problem Relation Age of Onset  . COPD Father   . Heart attack Sister     died age 70 of MI     History   Social History  . Marital Status: Married    Spouse Name: N/A    Number of Children: N/A  . Years of Education: N/A   Occupational History  . Not on file.   Social History Main Topics  . Smoking status: Never Smoker   . Smokeless tobacco: Never Used  . Alcohol Use: Yes     Comment: rare - 1-2x /yr  . Drug Use: No  . Sexually Active: Not on file   Other Topics Concern  . Not on file   Social History Narrative  . No narrative on file     BP 132/78  Pulse 81  Ht 5'  9" (1.753 m)  Wt 276 lb 12.8 oz (125.556 kg)  BMI 40.86 kg/m2  Physical Exam:  Morbidly obese appearing 67 year old man, NAD HEENT: Unremarkable Neck:  7 cm JVD, no thyromegally Back:  No CVA tenderness Lungs:  Clear with no wheezes, rales, or rhonchi. HEART:  IRegular rate rhythm, no murmurs, no rubs, no clicks Abd:  soft, positive bowel sounds, no organomegally, no rebound, no guarding Ext:  2 plus pulses, no edema, no cyanosis, no clubbing Skin:  No rashes no nodules Neuro:  CN II through XII intact, motor grossly intact  EKG - atrial fibrillation   Assess/Plan:

## 2013-06-21 ENCOUNTER — Ambulatory Visit (INDEPENDENT_AMBULATORY_CARE_PROVIDER_SITE_OTHER): Payer: BC Managed Care – PPO | Admitting: Internal Medicine

## 2013-06-21 ENCOUNTER — Encounter: Payer: Self-pay | Admitting: Internal Medicine

## 2013-06-21 VITALS — BP 148/86 | HR 89 | Ht 69.0 in | Wt 290.4 lb

## 2013-06-21 DIAGNOSIS — R0609 Other forms of dyspnea: Secondary | ICD-10-CM

## 2013-06-21 DIAGNOSIS — R0600 Dyspnea, unspecified: Secondary | ICD-10-CM | POA: Insufficient documentation

## 2013-06-21 DIAGNOSIS — R5381 Other malaise: Secondary | ICD-10-CM

## 2013-06-21 DIAGNOSIS — I484 Atypical atrial flutter: Secondary | ICD-10-CM

## 2013-06-21 DIAGNOSIS — R413 Other amnesia: Secondary | ICD-10-CM

## 2013-06-21 DIAGNOSIS — R5383 Other fatigue: Secondary | ICD-10-CM

## 2013-06-21 DIAGNOSIS — I4891 Unspecified atrial fibrillation: Secondary | ICD-10-CM

## 2013-06-21 DIAGNOSIS — R0989 Other specified symptoms and signs involving the circulatory and respiratory systems: Secondary | ICD-10-CM | POA: Diagnosis not present

## 2013-06-21 DIAGNOSIS — I4892 Unspecified atrial flutter: Secondary | ICD-10-CM

## 2013-06-21 LAB — CBC WITH DIFFERENTIAL/PLATELET
Basophils Absolute: 0 10*3/uL (ref 0.0–0.1)
Basophils Relative: 0.2 % (ref 0.0–3.0)
Eosinophils Absolute: 0.1 10*3/uL (ref 0.0–0.7)
Hemoglobin: 11.2 g/dL — ABNORMAL LOW (ref 13.0–17.0)
Lymphocytes Relative: 23.1 % (ref 12.0–46.0)
Monocytes Relative: 8.6 % (ref 3.0–12.0)
Neutro Abs: 4.4 10*3/uL (ref 1.4–7.7)
Neutrophils Relative %: 66.9 % (ref 43.0–77.0)
RBC: 4.01 Mil/uL — ABNORMAL LOW (ref 4.22–5.81)

## 2013-06-21 LAB — BASIC METABOLIC PANEL
CO2: 27 mEq/L (ref 19–32)
Chloride: 102 mEq/L (ref 96–112)
Creatinine, Ser: 1.1 mg/dL (ref 0.4–1.5)
Potassium: 4.3 mEq/L (ref 3.5–5.1)

## 2013-06-21 LAB — TSH: TSH: 1.75 u[IU]/mL (ref 0.35–5.50)

## 2013-06-21 LAB — VITAMIN B12: Vitamin B-12: 298 pg/mL (ref 211–911)

## 2013-06-21 NOTE — Assessment & Plan Note (Signed)
The patient is encouraged to lose weight. I suspect that there is a portion of his problem caused solely by his obesity.

## 2013-06-21 NOTE — Patient Instructions (Signed)
Your physician recommends that you continue on your current medications as directed. Please refer to the Current Medication list given to you today.  Your physician recommends that you have labs today: BMET, TSH, BMP, B12  Your physician has requested that you have an echocardiogram. Echocardiography is a painless test that uses sound waves to create images of your heart. It provides your doctor with information about the size and shape of your heart and how well your heart's chambers and valves are working. This procedure takes approximately one hour. There are no restrictions for this procedure.  Your physician recommends that you schedule a follow-up appointment in: 3 months with Dr. Ladona Ridgel.

## 2013-06-21 NOTE — Assessment & Plan Note (Signed)
His ventricular rate appears to be fairly well controlled. His heart rate today is 90 at rest. For now, I will not add any additional medications.

## 2013-06-21 NOTE — Assessment & Plan Note (Signed)
The etiology is unclear. I recommended a 2-D echo. Additional medical recommendations will follow the results of the echo.

## 2013-06-21 NOTE — Progress Notes (Signed)
HPI [name] returns today for followup. He is a very pleasant 67 year old man with morbid obesity and hypertension and diabetes. He  Has not been hospitalized.  The patient has had a stomach virus which led to shortness of breath , and it is taken over a month for any improvement. He notes that he is still tired and sleeps a lot. He has had increasing fatigue and weakness.The patient has no knowledge that he is in atrial fibrillation. He denies chest pain or syncope. No palpitations. He has mild dyspnea with exertion. Finally, the patient has had problems with memory. His daughter who is a nurse at the hospital notes that this has been significantly worse over the last few weeks. He denies any neurologic changes otherwise. Allergies  Allergen Reactions  . Iohexol      Desc: HIVES S/P 13HR.PREMEDS,PT WEIGHS 367LBS,?LOW DOSAGE PREMEDS   . Ivp Dye [Iodinated Diagnostic Agents]     welps      Current Outpatient Prescriptions  Medication Sig Dispense Refill  . diltiazem (CARDIZEM CD) 360 MG 24 hr capsule Take 1 capsule (360 mg total) by mouth daily.  30 capsule  1  . febuxostat (ULORIC) 40 MG tablet Take 80 mg by mouth daily.      Marland Kitchen Fexofenadine HCl (ALLEGRA PO) Take 1 tablet by mouth daily.      . fluorouracil (EFUDEX) 5 % cream Apply topically 2 (two) times daily.      Marland Kitchen glyBURIDE (DIABETA) 5 MG tablet Take 5 mg by mouth 2 (two) times daily with a meal.      . indomethacin (INDOCIN) 50 MG capsule Take 50 mg by mouth as needed.      . metFORMIN (GLUCOPHAGE) 1000 MG tablet Take 1,000 mg by mouth 2 (two) times daily with a meal.      . pantoprazole (PROTONIX) 40 MG tablet Take 1 tablet (40 mg total) by mouth daily.  30 tablet  1  . Rivaroxaban (XARELTO) 20 MG TABS Take 1 tablet (20 mg total) by mouth daily.  30 tablet  1   No current facility-administered medications for this visit.     Past Medical History  Diagnosis Date  . Diabetes mellitus without complication     improved after  diet/exercise  . Hypertension     improved after diet/exercise  . Kidney stones   . GERD (gastroesophageal reflux disease)   . H/O hiatal hernia   . Arthritis     hips  . OSA (obstructive sleep apnea)     wears cpap  . Anal fissure     h/o - no recent complications    ROS:   All systems reviewed and negative except as noted in the HPI.   Past Surgical History  Procedure Laterality Date  . Cholecystectomy    . Knee arthroscopy    . Carpel tunnel    . Ligament repair    . Kidney stone surgery       Family History  Problem Relation Age of Onset  . COPD Father   . Heart attack Sister     died age 81 of MI     History   Social History  . Marital Status: Married    Spouse Name: N/A    Number of Children: N/A  . Years of Education: N/A   Occupational History  . Not on file.   Social History Main Topics  . Smoking status: Never Smoker   . Smokeless tobacco: Never Used  . Alcohol  Use: Yes     Comment: rare - 1-2x /yr  . Drug Use: No  . Sexual Activity: Not on file   Other Topics Concern  . Not on file   Social History Narrative  . No narrative on file     BP 148/86  Pulse 89  Ht 5\' 9"  (1.753 m)  Wt 290 lb 6.4 oz (131.725 kg)  BMI 42.87 kg/m2  Physical Exam:  Morbidly obese appearing 67 year old man, NAD HEENT: Unremarkable Neck:  7 cm JVD, no thyromegally Back:  No CVA tenderness Lungs:  Clear with no wheezes, rales, or rhonchi. HEART:  IRegular rate rhythm, no murmurs, no rubs, no clicks Abd:  soft, positive bowel sounds, no organomegally, no rebound, no guarding Ext:  2 plus pulses,  Trace peripheral edema, no cyanosis, no clubbing Skin:  No rashes no nodules Neuro:  CN II through XII intact, motor grossly intact  EKG - atrial fibrillation   Assess/Plan:

## 2013-06-22 ENCOUNTER — Telehealth: Payer: Self-pay | Admitting: *Deleted

## 2013-06-22 NOTE — Telephone Encounter (Signed)
Oscar [name] called in for clarification from yesterdays office visit. She was under the impression that xarelto was going to be changed to something else, but when they checked with the pharmacy nothing was there. Please advise. Thanks, MI

## 2013-06-22 NOTE — Telephone Encounter (Signed)
Spoke with patient and let him know I would ask Dr Ladona Ridgel tomorrow as his note did not reflect an change in medications

## 2013-06-23 MED ORDER — APIXABAN 5 MG PO TABS: 5.0000 mg | ORAL_TABLET | Freq: Two times a day (BID) | ORAL | Status: DC

## 2013-06-23 NOTE — Telephone Encounter (Signed)
Discussed with Dr Ladona Ridgel will stop Xarelto and call in Eliquis 5mg  twice daily to see if this will help with his memory loss.  Spoke with the patients wife  She is aware of plan

## 2013-07-05 ENCOUNTER — Ambulatory Visit (HOSPITAL_COMMUNITY): Payer: Medicare Other | Attending: Internal Medicine | Admitting: Radiology

## 2013-07-05 DIAGNOSIS — I484 Atypical atrial flutter: Secondary | ICD-10-CM

## 2013-07-05 DIAGNOSIS — I4892 Unspecified atrial flutter: Secondary | ICD-10-CM | POA: Insufficient documentation

## 2013-07-05 DIAGNOSIS — I10 Essential (primary) hypertension: Secondary | ICD-10-CM | POA: Diagnosis not present

## 2013-07-05 DIAGNOSIS — E119 Type 2 diabetes mellitus without complications: Secondary | ICD-10-CM | POA: Insufficient documentation

## 2013-07-05 DIAGNOSIS — I059 Rheumatic mitral valve disease, unspecified: Secondary | ICD-10-CM | POA: Diagnosis not present

## 2013-07-05 DIAGNOSIS — I4891 Unspecified atrial fibrillation: Secondary | ICD-10-CM | POA: Diagnosis not present

## 2013-07-05 DIAGNOSIS — R0600 Dyspnea, unspecified: Secondary | ICD-10-CM

## 2013-07-05 DIAGNOSIS — R0602 Shortness of breath: Secondary | ICD-10-CM

## 2013-07-05 NOTE — Progress Notes (Signed)
Echocardiogram performed.  

## 2013-08-06 ENCOUNTER — Encounter (HOSPITAL_COMMUNITY): Payer: Self-pay | Admitting: Emergency Medicine

## 2013-08-06 ENCOUNTER — Observation Stay (HOSPITAL_COMMUNITY)
Admission: EM | Admit: 2013-08-06 | Discharge: 2013-08-08 | Disposition: A | Discharge status: Home / Self Care | Payer: BC Managed Care – PPO | Attending: Internal Medicine | Admitting: Internal Medicine

## 2013-08-06 DIAGNOSIS — R42 Dizziness and giddiness: Secondary | ICD-10-CM | POA: Diagnosis not present

## 2013-08-06 DIAGNOSIS — N182 Chronic kidney disease, stage 2 (mild): Secondary | ICD-10-CM | POA: Diagnosis present

## 2013-08-06 DIAGNOSIS — E119 Type 2 diabetes mellitus without complications: Secondary | ICD-10-CM | POA: Insufficient documentation

## 2013-08-06 DIAGNOSIS — I10 Essential (primary) hypertension: Secondary | ICD-10-CM | POA: Insufficient documentation

## 2013-08-06 DIAGNOSIS — G459 Transient cerebral ischemic attack, unspecified: Secondary | ICD-10-CM | POA: Diagnosis present

## 2013-08-06 DIAGNOSIS — I4891 Unspecified atrial fibrillation: Secondary | ICD-10-CM | POA: Insufficient documentation

## 2013-08-06 DIAGNOSIS — M109 Gout, unspecified: Secondary | ICD-10-CM

## 2013-08-06 DIAGNOSIS — R0600 Dyspnea, unspecified: Secondary | ICD-10-CM

## 2013-08-06 DIAGNOSIS — Z87442 Personal history of urinary calculi: Secondary | ICD-10-CM | POA: Insufficient documentation

## 2013-08-06 DIAGNOSIS — E1165 Type 2 diabetes mellitus with hyperglycemia: Secondary | ICD-10-CM

## 2013-08-06 DIAGNOSIS — Z79899 Other long term (current) drug therapy: Secondary | ICD-10-CM | POA: Insufficient documentation

## 2013-08-06 DIAGNOSIS — R112 Nausea with vomiting, unspecified: Secondary | ICD-10-CM | POA: Diagnosis not present

## 2013-08-06 DIAGNOSIS — G4733 Obstructive sleep apnea (adult) (pediatric): Secondary | ICD-10-CM | POA: Insufficient documentation

## 2013-08-06 DIAGNOSIS — R0602 Shortness of breath: Secondary | ICD-10-CM | POA: Diagnosis not present

## 2013-08-06 DIAGNOSIS — D649 Anemia, unspecified: Secondary | ICD-10-CM | POA: Diagnosis present

## 2013-08-06 DIAGNOSIS — I639 Cerebral infarction, unspecified: Secondary | ICD-10-CM

## 2013-08-06 DIAGNOSIS — M1710 Unilateral primary osteoarthritis, unspecified knee: Secondary | ICD-10-CM | POA: Insufficient documentation

## 2013-08-06 DIAGNOSIS — K219 Gastro-esophageal reflux disease without esophagitis: Secondary | ICD-10-CM | POA: Insufficient documentation

## 2013-08-06 HISTORY — DX: Unspecified atrial fibrillation: I48.91

## 2013-08-06 NOTE — ED Notes (Addendum)
N/v ~ 1 hr.; sob when going to lay down sudden onset of sob and dizziness, then vomited x 2 while laying in bed.  Upon ems arrival: cool, clammy, 12 lead ecg showed afib. Did not take cardizem this morning. Vomited x 2 and given zofran 4 mg piv.

## 2013-08-07 ENCOUNTER — Encounter (HOSPITAL_COMMUNITY): Payer: Self-pay | Admitting: Radiology

## 2013-08-07 ENCOUNTER — Observation Stay (HOSPITAL_COMMUNITY): Payer: BC Managed Care – PPO

## 2013-08-07 ENCOUNTER — Emergency Department (HOSPITAL_COMMUNITY): Payer: BC Managed Care – PPO

## 2013-08-07 DIAGNOSIS — I4891 Unspecified atrial fibrillation: Secondary | ICD-10-CM | POA: Diagnosis not present

## 2013-08-07 DIAGNOSIS — D649 Anemia, unspecified: Secondary | ICD-10-CM

## 2013-08-07 DIAGNOSIS — G459 Transient cerebral ischemic attack, unspecified: Secondary | ICD-10-CM

## 2013-08-07 DIAGNOSIS — R42 Dizziness and giddiness: Secondary | ICD-10-CM | POA: Diagnosis not present

## 2013-08-07 DIAGNOSIS — I10 Essential (primary) hypertension: Secondary | ICD-10-CM | POA: Diagnosis present

## 2013-08-07 DIAGNOSIS — E1165 Type 2 diabetes mellitus with hyperglycemia: Secondary | ICD-10-CM

## 2013-08-07 DIAGNOSIS — N182 Chronic kidney disease, stage 2 (mild): Secondary | ICD-10-CM

## 2013-08-07 LAB — COMPREHENSIVE METABOLIC PANEL
AST: 45 U/L — ABNORMAL HIGH (ref 0–37)
Albumin: 3.6 g/dL (ref 3.5–5.2)
Alkaline Phosphatase: 56 U/L (ref 39–117)
BUN: 22 mg/dL (ref 6–23)
Calcium: 9 mg/dL (ref 8.4–10.5)
Creatinine, Ser: 1.15 mg/dL (ref 0.50–1.35)
GFR calc Af Amer: 74 mL/min — ABNORMAL LOW (ref >90)
Glucose, Bld: 355 mg/dL — ABNORMAL HIGH (ref 70–99)
Potassium: 3.9 mEq/L (ref 3.5–5.1)
Sodium: 135 mEq/L (ref 135–145)
Total Bilirubin: 0.3 mg/dL (ref 0.3–1.2)
Total Protein: 7.5 g/dL (ref 6.0–8.3)

## 2013-08-07 LAB — URINE MICROSCOPIC-ADD ON

## 2013-08-07 LAB — RAPID URINE DRUG SCREEN, HOSP PERFORMED
Amphetamines: NOT DETECTED
Benzodiazepines: NOT DETECTED
Cocaine: NOT DETECTED
Opiates: NOT DETECTED

## 2013-08-07 LAB — LIPID PANEL
Cholesterol: 200 mg/dL (ref 0–200)
HDL: 30 mg/dL — ABNORMAL LOW (ref >39)
Total CHOL/HDL Ratio: 6.7 RATIO
Triglycerides: 225 mg/dL — ABNORMAL HIGH (ref <150)

## 2013-08-07 LAB — CBC
HCT: 34.2 % — ABNORMAL LOW (ref 39.0–52.0)
Hemoglobin: 11.3 g/dL — ABNORMAL LOW (ref 13.0–17.0)
MCH: 27.8 pg (ref 26.0–34.0)
MCHC: 33 g/dL (ref 30.0–36.0)
MCV: 84.2 fL (ref 78.0–100.0)
RBC: 4.06 MIL/uL — ABNORMAL LOW (ref 4.22–5.81)
WBC: 5.5 10*3/uL (ref 4.0–10.5)

## 2013-08-07 LAB — DIFFERENTIAL
Basophils Absolute: 0 10*3/uL (ref 0.0–0.1)
Basophils Relative: 0 % (ref 0–1)
Eosinophils Absolute: 0.2 10*3/uL (ref 0.0–0.7)
Eosinophils Relative: 3 % (ref 0–5)
Lymphs Abs: 1.6 10*3/uL (ref 0.7–4.0)
Monocytes Absolute: 0.5 10*3/uL (ref 0.1–1.0)
Monocytes Relative: 9 % (ref 3–12)
Neutro Abs: 3.2 10*3/uL (ref 1.7–7.7)
Neutrophils Relative %: 59 % (ref 43–77)

## 2013-08-07 LAB — FOLATE: Folate: 13.3 ng/mL

## 2013-08-07 LAB — GLUCOSE, CAPILLARY
Glucose-Capillary: 263 mg/dL — ABNORMAL HIGH (ref 70–99)
Glucose-Capillary: 280 mg/dL — ABNORMAL HIGH (ref 70–99)
Glucose-Capillary: 309 mg/dL — ABNORMAL HIGH (ref 70–99)
Glucose-Capillary: 329 mg/dL — ABNORMAL HIGH (ref 70–99)

## 2013-08-07 LAB — URINALYSIS, ROUTINE W REFLEX MICROSCOPIC
Glucose, UA: >1000 mg/dL — AB
Ketones, ur: NEGATIVE mg/dL
Leukocytes, UA: NEGATIVE
pH: 5.5 (ref 5.0–8.0)

## 2013-08-07 LAB — IRON AND TIBC
Iron: 48 ug/dL (ref 42–135)
Saturation Ratios: 12 % — ABNORMAL LOW (ref 20–55)
TIBC: 386 ug/dL (ref 215–435)

## 2013-08-07 LAB — PROTIME-INR
INR: 1.12 (ref 0.00–1.49)
Prothrombin Time: 14.2 seconds (ref 11.6–15.2)

## 2013-08-07 LAB — HEMOGLOBIN A1C: Mean Plasma Glucose: 243 mg/dL — ABNORMAL HIGH (ref <117)

## 2013-08-07 LAB — APTT: aPTT: 25 seconds (ref 24–37)

## 2013-08-07 LAB — VITAMIN B12: Vitamin B-12: 429 pg/mL (ref 211–911)

## 2013-08-07 LAB — TROPONIN I: Troponin I: <0.3 ng/mL (ref <0.30)

## 2013-08-07 MED ORDER — DILTIAZEM HCL ER COATED BEADS 360 MG PO CP24
360.0000 mg | ORAL_CAPSULE | Freq: Every day | ORAL | Status: DC
  Administered 2013-08-07 – 2013-08-08 (×2): 360 mg via ORAL
  Filled 2013-08-07 (×2): qty 1

## 2013-08-07 MED ORDER — GLYBURIDE 5 MG PO TABS
5.0000 mg | ORAL_TABLET | Freq: Two times a day (BID) | ORAL | Status: DC
  Administered 2013-08-07 – 2013-08-08 (×3): 5 mg via ORAL
  Filled 2013-08-07 (×5): qty 1

## 2013-08-07 MED ORDER — INSULIN ASPART 100 UNIT/ML ~~LOC~~ SOLN
0.0000 [IU] | Freq: Three times a day (TID) | SUBCUTANEOUS | Status: DC
  Administered 2013-08-07 (×3): 5 [IU] via SUBCUTANEOUS
  Administered 2013-08-08: 3 [IU] via SUBCUTANEOUS
  Administered 2013-08-08: 7 [IU] via SUBCUTANEOUS

## 2013-08-07 MED ORDER — FEBUXOSTAT 40 MG PO TABS
80.0000 mg | ORAL_TABLET | Freq: Every day | ORAL | Status: DC
  Administered 2013-08-07 – 2013-08-08 (×2): 80 mg via ORAL
  Filled 2013-08-07 (×2): qty 2

## 2013-08-07 MED ORDER — TAMSULOSIN HCL 0.4 MG PO CAPS
0.4000 mg | ORAL_CAPSULE | Freq: Every day | ORAL | Status: DC
  Administered 2013-08-07 – 2013-08-08 (×2): 0.4 mg via ORAL
  Filled 2013-08-07 (×3): qty 1

## 2013-08-07 MED ORDER — STROKE: EARLY STAGES OF RECOVERY BOOK
Freq: Once | Status: AC
  Administered 2013-08-07: 07:00:00
  Filled 2013-08-07: qty 1

## 2013-08-07 MED ORDER — LORATADINE 10 MG PO TABS
10.0000 mg | ORAL_TABLET | Freq: Every day | ORAL | Status: DC
  Administered 2013-08-07 – 2013-08-08 (×2): 10 mg via ORAL
  Filled 2013-08-07 (×2): qty 1

## 2013-08-07 MED ORDER — ONDANSETRON HCL 4 MG/2ML IJ SOLN: 4.0000 mg | Freq: Three times a day (TID) | INTRAMUSCULAR | Status: AC | PRN

## 2013-08-07 MED ORDER — HYDROMORPHONE HCL PF 1 MG/ML IJ SOLN: 0.5000 mg | INTRAMUSCULAR | Status: DC | PRN

## 2013-08-07 MED ORDER — FEBUXOSTAT 80 MG PO TABS
1.0000 | ORAL_TABLET | Freq: Every day | ORAL | Status: DC
Start: 2013-08-07 — End: 2013-08-07

## 2013-08-07 MED ORDER — INSULIN ASPART 100 UNIT/ML ~~LOC~~ SOLN
0.0000 [IU] | Freq: Every day | SUBCUTANEOUS | Status: DC
  Administered 2013-08-07: 4 [IU] via SUBCUTANEOUS

## 2013-08-07 MED ORDER — APIXABAN 5 MG PO TABS
5.0000 mg | ORAL_TABLET | Freq: Two times a day (BID) | ORAL | Status: DC
  Administered 2013-08-07 – 2013-08-08 (×3): 5 mg via ORAL
  Filled 2013-08-07 (×4): qty 1

## 2013-08-07 MED ORDER — PANTOPRAZOLE SODIUM 40 MG PO TBEC
40.0000 mg | DELAYED_RELEASE_TABLET | Freq: Every day | ORAL | Status: DC
  Administered 2013-08-07 – 2013-08-08 (×2): 40 mg via ORAL
  Filled 2013-08-07: qty 1

## 2013-08-07 NOTE — Progress Notes (Signed)
VASCULAR LAB PRELIMINARY  PRELIMINARY  PRELIMINARY  PRELIMINARY  Carotid duplex  completed.    Preliminary report:  Bilateral:  1-39% ICA stenosis.  Vertebral artery flow is antegrade.      Chaye Misch, RVT 08/07/2013, 10:38 AM

## 2013-08-07 NOTE — Progress Notes (Signed)
Utilization Review completed.  

## 2013-08-07 NOTE — ED Notes (Signed)
MD at bedside. Dr. Jenkins at bedside.  

## 2013-08-07 NOTE — H&P (Signed)
[facility] History and Physical  [name] HYQ:[id number] DOB: Dec 10, 1945 DOA: 08/06/2013  Referring physician: PCP: Pearla Dubonnet, MD  Specialists:   Chief Complaint:   Dizziness  HPI: Oscar [name] is a 67 y.o. male with a history of Atrial Fibrillation and DM2 who presents to the ED with complaints of sudden onset of Dizziness when he was about to lay down [place] to sleep this evening around 10:30pm.   He then tried to get up and had difficulty walking due to the dizziness, he was having to hold on to the walls because he was unsteady.   He denies having any chest pain, headache, or fever or chills. He also denied having nausea or vomiting.  He did have diaphoresis and SOB. He denied that the symptom was worse with turning of his head but he reports that his dizziness was worse with laying down.      In the ED , he was evaluated and a CT scan of the Head was performed which was negative for acute findings.  Neurology Dr. Roseanne Reno was contacted by the ED, and patient was referred for admission for a TIA/CVA workup.        Review of Systems: The patient denies anorexia, fever, chills, headaches, weight loss, vision loss, diplopia, dizziness, decreased hearing, rhinitis, hoarseness, chest pain, syncope, dyspnea on exertion, peripheral edema, balance deficits, cough, hemoptysis, abdominal pain, nausea, vomiting, diarrhea, constipation, hematemesis, melena, hematochezia, severe indigestion/heartburn, dysuria, hematuria, incontinence, muscle weakness, suspicious skin lesions, transient blindness, difficulty walking, depression, unusual weight change, abnormal bleeding, enlarged lymph nodes, angioedema, and breast masses.    Past Medical History  Diagnosis Date  . Diabetes mellitus without complication     improved after diet/exercise  . Hypertension     improved after diet/exercise  . Kidney stones   . GERD (gastroesophageal reflux disease)   . H/O hiatal hernia   . Arthritis      hips  . OSA (obstructive sleep apnea)     wears cpap  . Anal fissure     h/o - no recent complications  . Atrial fibrillation     Past Surgical History  Procedure Laterality Date  . Cholecystectomy    . Knee arthroscopy    . Carpel tunnel    . Ligament repair    . Kidney stone surgery          Right Tympanoplasty due to Rupture  Prior to Admission medications   Medication Sig Start Date End Date Taking? Authorizing Provider  apixaban (ELIQUIS) 5 MG TABS tablet Take 1 tablet (5 mg total) by mouth 2 (two) times daily. 06/23/13  Yes Marinus Maw, MD  diltiazem (CARDIZEM CD) 360 MG 24 hr capsule Take 1 capsule (360 mg total) by mouth daily. 11/18/12  Yes Marden Noble, MD  Febuxostat (ULORIC) 80 MG TABS Take 1 tablet by mouth daily.   Yes Historical Provider, MD  Fexofenadine HCl (ALLEGRA PO) Take 1 tablet by mouth daily.   Yes Historical Provider, MD  fluorouracil (EFUDEX) 5 % cream Apply 1 application topically 2 (two) times daily. Apply to sun spots   Yes Historical Provider, MD  glyBURIDE (DIABETA) 5 MG tablet Take 5 mg by mouth 2 (two) times daily with a meal.   Yes Historical Provider, MD  indomethacin (INDOCIN) 50 MG capsule Take 50-150 mg by mouth daily as needed (for gout flare ups     start with 3 capsules, then taper down daily to 1 capsule).  Yes Historical Provider, MD  metFORMIN (GLUCOPHAGE) 1000 MG tablet Take 1,000 mg by mouth 2 (two) times daily with a meal.   Yes Historical Provider, MD  pantoprazole (PROTONIX) 40 MG tablet Take 1 tablet (40 mg total) by mouth daily. 11/17/12  Yes Marden Noble, MD  tamsulosin (FLOMAX) 0.4 MG CAPS capsule Take 0.4 mg by mouth daily after breakfast.   Yes Historical Provider, MD  febuxostat (ULORIC) 40 MG tablet Take 40 mg by mouth daily.     Historical Provider, MD    Allergies  Allergen Reactions  . Iohexol      Desc: HIVES S/P 13HR.PREMEDS,PT WEIGHS 367LBS,?LOW DOSAGE PREMEDS   . Ivp Dye [Iodinated Diagnostic Agents]      welps     Social History:  reports that he has never smoked. He has never used smokeless tobacco. He reports that he drinks alcohol. He reports that he does not use illicit drugs.     Family History  Problem Relation Age of Onset  . COPD Father   . Heart attack Sister     died age 44 of MI      Physical Exam:  GEN:  Pleasant Morbidly Obese 67 y.o. Caucasian male examined  and in no acute distress; cooperative with exam Filed Vitals:   08/07/13 0020 08/07/13 0030 08/07/13 0205 08/07/13 0235  BP: 142/83 138/85  142/83  Pulse:  77 88 99  Temp:      TempSrc:      Resp:  18 13 21   Height:      Weight:      SpO2:  95% 97% 95%   Blood pressure 142/83, pulse 99, temperature 97.7 F (36.5 C), temperature source Oral, resp. rate 21, height 5\' 9"  (1.753 m), weight 129.275 kg (285 lb), SpO2 95.00%. PSYCH: He is alert and oriented x4; does not appear anxious does not appear depressed; affect is normal HEENT: Normocephalic and Atraumatic, Mucous membranes pink; PERRLA; EOM intact; Fundi:  Benign;  No scleral icterus,  Ears:  Left TM: Clear , Dull Right TM;  Nares: Patent, Oropharynx: Clear, Fair Dentition, Neck:  FROM, no cervical lymphadenopathy nor thyromegaly or carotid bruit; no JVD; Breasts:: Not examined CHEST WALL: No tenderness CHEST: Normal respiration, clear to auscultation bilaterally HEART: Regular rate and rhythm; no murmurs rubs or gallops BACK: No kyphosis or scoliosis; no CVA tenderness ABDOMEN: Positive Bowel Sounds, Obese, soft non-tender; no masses, no organomegaly, no pannus; no intertriginous candida. Rectal Exam: Not done EXTREMITIES: No cyanosis, clubbing or edema; no ulcerations. Genitalia: not examined PULSES: 2+ and symmetric SKIN: Normal hydration no rash or ulceration CNS: Cranial nerves 2-12 grossly intact no focal neurologic deficit    Labs on Admission:  Basic Metabolic Panel:  Recent Labs Lab 08/07/13 0010  NA 135  K 3.9  CL 99  CO2 23   GLUCOSE 355*  BUN 22  CREATININE 1.15  CALCIUM 9.0   Liver Function Tests:  Recent Labs Lab 08/07/13 0010  AST 45*  ALT 45  ALKPHOS 56  BILITOT 0.3  PROT 7.5  ALBUMIN 3.6   No results found for this basename: LIPASE, AMYLASE,  in the last 168 hours No results found for this basename: AMMONIA,  in the last 168 hours CBC:  Recent Labs Lab 08/07/13 0010  WBC 5.5  NEUTROABS 3.2  HGB 11.3*  HCT 34.2*  MCV 84.2  PLT 156   Cardiac Enzymes:  Recent Labs Lab 08/07/13 0010  TROPONINI <0.30    BNP (last 3  results) No results found for this basename: PROBNP,  in the last 8760 hours CBG:  Recent Labs Lab 08/07/13 0023  GLUCAP 329*    Radiological Exams on Admission: Ct Head Wo Contrast  08/07/2013   CLINICAL DATA:  Dizziness, vomiting  EXAM: CT HEAD WITHOUT CONTRAST  TECHNIQUE: Contiguous axial images were obtained from the base of the skull through the vertex without contrast.  COMPARISON:  02/28/2012  FINDINGS: Mild brain atrophy and chronic white matter microvascular ischemic changes diffusely. Mild associated ventricular enlargement. No acute intracranial hemorrhage, definite infarction, mass lesion, midline shift, herniation, or extra-axial fluid collection. No focal edema or mass effect. Cisterns are patent. Cerebellar atrophy as well. Atherosclerosis of the intracranial vessels. Mastoids and sinuses clear.  IMPRESSION: Stable atrophy and chronic microvascular ischemic disease.  No acute finding by noncontrast CT   Electronically Signed   By: Ruel Favors M.D.   On: 08/07/2013 01:16     EKG: Independently reviewed.    Assessment/Plan Principal Problem:   TIA (transient ischemic attack) Active Problems:   Dizziness   Atrial fibrillation   Hypertension   Diabetes mellitus type 2, uncontrolled, with complications   CKD (chronic kidney disease), stage II   Anemia    1.  TIA/Dizziness-   TIA Workup Initiated, Neuro Checks, Monitor on Telemetry, MRI/MRA ,  Carotid US and 2D ECHO in AM.   Neuro consult.    2.   Atrial Fibrillation-  On Eliquis and Diltiazem Rx.    3.   HTN-  Continue Diltiazem rx.    4.   DM2-  Continue Glyburide Rx, Hold Metformin due to contrast imaging.   SSI coverage PRN Elevated Glucose levels.   Check HbA1C in AM.    5.  CKD stage II- Monitor BUN/Cr.    6.  Anemia-  Anemia Panel sent.       Code Status:    FULL CODE Family Communication:    Wife and Daughter at Bedside Disposition Plan:  Observation Status       Time spent:  11 Minutes  Ron Parker [facility] Pager [phone]  If 7PM-7AM, please contact night-coverage [email or web address] Password [facility] 08/07/2013, 3:17 AM

## 2013-08-07 NOTE — ED Notes (Signed)
Dr. Hyacinth Meeker at bedside talking with patient and doing comprehensive assessment; Dr. Hyacinth Meeker doing Neuro exam.

## 2013-08-07 NOTE — ED Provider Notes (Signed)
CSN: [id number]     Arrival date & time 08/06/13  2353 History   First MD Initiated Contact with Patient 08/07/13 0009     Chief Complaint  Patient presents with  . Nausea  . Emesis   (Consider location/radiation/quality/duration/timing/severity/associated sxs/prior Treatment) HPI Comments: Pt is a 67 y/o male recently dx with afib - on eliquis, cardizem, had acute onset of dizziness this evening when he went to lay in bed - when he attained the supine position he had acute dizziness - felt that he couldn't walk straight without holding onto something - had difficulty calling his wife  To get her to call 911, and then had assocaited n/v and diaphoresis.  No tinnitus, no ear pain or loss of hearing, no change in vision, no weakness or numbness or facial droop or speecch trouble.  He has no hx of CVA - sx are persistent though they have improved significantly - during exam pt has recurrence of sx.  He ha no CP, no palpitations, but has mild pressure in his L neck.  Nothing makes better or worse, he has no trouble with movement of the head and denies vertigo.  He felt like he had near syncope but that feeling had resolved.   Patient is a 67 y.o. male presenting with vomiting. The history is provided by the patient and the EMS personnel.  Emesis   Past Medical History  Diagnosis Date  . Diabetes mellitus without complication     improved after diet/exercise  . Hypertension     improved after diet/exercise  . Kidney stones   . GERD (gastroesophageal reflux disease)   . H/O hiatal hernia   . Arthritis     hips  . OSA (obstructive sleep apnea)     wears cpap  . Anal fissure     h/o - no recent complications  . Atrial fibrillation    Past Surgical History  Procedure Laterality Date  . Cholecystectomy    . Knee arthroscopy    . Carpel tunnel    . Ligament repair    . Kidney stone surgery     Family History  Problem Relation Age of Onset  . COPD Father   . Heart attack Sister    died age 9 of MI   History  Substance Use Topics  . Smoking status: Never Smoker   . Smokeless tobacco: Never Used  . Alcohol Use: Yes     Comment: rare - 1-2x /yr    Review of Systems  Gastrointestinal: Positive for vomiting.  All other systems reviewed and are negative.    Allergies  Iohexol and Ivp dye  Home Medications   Current Outpatient Rx  Name  Route  Sig  Dispense  Refill  . apixaban (ELIQUIS) 5 MG TABS tablet   Oral   Take 1 tablet (5 mg total) by mouth 2 (two) times daily.   60 tablet   11   . diltiazem (CARDIZEM CD) 360 MG 24 hr capsule   Oral   Take 1 capsule (360 mg total) by mouth daily.   30 capsule   1   . Febuxostat (ULORIC) 80 MG TABS   Oral   Take 1 tablet by mouth daily.         Marland Kitchen Fexofenadine HCl (ALLEGRA PO)   Oral   Take 1 tablet by mouth daily.         . fluorouracil (EFUDEX) 5 % cream   Topical   Apply 1 application topically  2 (two) times daily. Apply to sun spots         . glyBURIDE (DIABETA) 5 MG tablet   Oral   Take 5 mg by mouth 2 (two) times daily with a meal.         . indomethacin (INDOCIN) 50 MG capsule   Oral   Take 50-150 mg by mouth daily as needed (for gout flare ups     start with 3 capsules, then taper down daily to 1 capsule).          . metFORMIN (GLUCOPHAGE) 1000 MG tablet   Oral   Take 1,000 mg by mouth 2 (two) times daily with a meal.         . pantoprazole (PROTONIX) 40 MG tablet   Oral   Take 1 tablet (40 mg total) by mouth daily.   30 tablet   1   . tamsulosin (FLOMAX) 0.4 MG CAPS capsule   Oral   Take 0.4 mg by mouth daily after breakfast.         . febuxostat (ULORIC) 40 MG tablet   Oral   Take 40 mg by mouth daily.           BP 138/84  Pulse 85  Temp(Src) 97.7 F (36.5 C) (Oral)  Resp 12  Ht 5\' 9"  (1.753 m)  Wt 285 lb (129.275 kg)  BMI 42.07 kg/m2  SpO2 97% Physical Exam  Nursing note and vitals reviewed. Constitutional: He appears well-developed and  well-nourished. No distress.  HENT:  Head: Normocephalic and atraumatic.  Mouth/Throat: Oropharynx is clear and moist. No oropharyngeal exudate.  Eyes: Conjunctivae and EOM are normal. Pupils are equal, round, and reactive to light. Right eye exhibits no discharge. Left eye exhibits no discharge. No scleral icterus.  Neck: Normal range of motion. Neck supple. No JVD present. No thyromegaly present.  Cardiovascular: Normal rate, regular rhythm, normal heart sounds and intact distal pulses.  Exam reveals no gallop and no friction rub.   No murmur heard. Pulmonary/Chest: Effort normal and breath sounds normal. No respiratory distress. He has no wheezes. He has no rales.  Abdominal: Soft. Bowel sounds are normal. He exhibits no distension and no mass. There is no tenderness.  Musculoskeletal: Normal range of motion. He exhibits no edema and no tenderness.  Lymphadenopathy:    He has no cervical adenopathy.  Neurological: He is alert. Coordination normal.  Neurologic exam:  Speech clear, pupils equal round reactive to light, extraocular movements intact  Normal peripheral visual fields Cranial nerves III through XII normal including no facial droop Follows commands, moves all extremities x4, normal strength to bilateral upper and lower extremities at all major muscle groups including grip Sensation normal to light touch and pinprick Coordination intact, no limb ataxia, finger-nose-finger normal Rapid alternating movements normal No pronator drift  Skin: Skin is warm and dry. No rash noted. No erythema.  Psychiatric: He has a normal mood and affect. His behavior is normal.    ED Course  Procedures (including critical care time) Labs Review Labs Reviewed  CBC - Abnormal; Notable for the following:    RBC 4.06 (*)    Hemoglobin 11.3 (*)    HCT 34.2 (*)    All other components within normal limits  COMPREHENSIVE METABOLIC PANEL - Abnormal; Notable for the following:    Glucose, Bld 355  (*)    AST 45 (*)    GFR calc non Af Amer 64 (*)    GFR calc Af [name]  74 (*)    All other components within normal limits  GLUCOSE, CAPILLARY - Abnormal; Notable for the following:    Glucose-Capillary 329 (*)    All other components within normal limits  ETHANOL  PROTIME-INR  APTT  DIFFERENTIAL  TROPONIN I  URINE RAPID DRUG SCREEN (HOSP PERFORMED)  URINALYSIS, ROUTINE W REFLEX MICROSCOPIC   Imaging Review Ct Head Wo Contrast  08/07/2013   CLINICAL DATA:  Dizziness, vomiting  EXAM: CT HEAD WITHOUT CONTRAST  TECHNIQUE: Contiguous axial images were obtained from the base of the skull through the vertex without contrast.  COMPARISON:  02/28/2012  FINDINGS: Mild brain atrophy and chronic white matter microvascular ischemic changes diffusely. Mild associated ventricular enlargement. No acute intracranial hemorrhage, definite infarction, mass lesion, midline shift, herniation, or extra-axial fluid collection. No focal edema or mass effect. Cisterns are patent. Cerebellar atrophy as well. Atherosclerosis of the intracranial vessels. Mastoids and sinuses clear.  IMPRESSION: Stable atrophy and chronic microvascular ischemic disease.  No acute finding by noncontrast CT   Electronically Signed   By: Ruel Favors M.D.   On: 08/07/2013 01:16    EKG Interpretation    Date/Time:  Sunday August 07 2013 00:07:59 EST Ventricular Rate:  93 PR Interval:    QRS Duration: 95 QT Interval:  372 QTC Calculation: 463 R Axis:   41 Text Interpretation:  Atrial fibrillation Ventricular premature complex Repol abnrm suggests ischemia, inferior leads Baseline wander in lead(s) II aVF Abnormal ekg Since last tracing ST and T wave abnormalities now seen in inferior leads Confirmed by Lovina Zuver  MD, Daril Warga ([id number]) on 08/07/2013 12:18:12 AM            MDM   1. Dizziness    The pt is mildly diaphoretic, no tachycardia, has no vertigo and no obvious coordination trouble - will need to ambulate - will d/w neuro.     Labs and CT unremarkable - pt with ongoing mild sx gradually improving - ambulated to bathroom with minimal dificulty.  D/w Neuro - they suggest stroke w/u but not code stroke as he is on blood thinners.  D/w Dr. Lovell Sheehan who will admit.    Vida Roller, MD 08/07/13 [phone]

## 2013-08-07 NOTE — Progress Notes (Signed)
Assessment/Plan: Principal Problem:   Dizziness - although true vertigo was absent, the sensation of motion and the associated N/V make me put this in the vertigo-like dizziness category. It lasted several hours with rather sudden offset. Diff dx will include inner ear issues as well as posterior circulation issues. I cancelled echo (he has had two this year!!). We will get MRI/MRA and carotids. I told him we may not know what it was.  Active Problems:   Diabetes mellitus type 2, uncontrolled, with complications   CKD (chronic kidney disease), stage II   TIA (transient ischemic attack) - this is not a definite dx.    Atrial fibrillation - rate controlled. On Eliquis.    Hypertension   Anemia   Subjective: Feels fine this morning.   I carefully questioned him about the dizziness. It was a drunk feeling with motion but not spinning that occurred in all positions (supine, sitting, standing). He was imbalanced. Some component of lightheadedness as well. Severe N/V with it. Offset was quick, possibly sudden, he is not sure.   Objective:  Vital Signs: Filed Vitals:   08/07/13 0320 08/07/13 0400 08/07/13 0600 08/07/13 0800  BP: 132/93 130/72 116/71 145/76  Pulse: 98 95 93 95  Temp: 97.9 F (36.6 C)  98 F (36.7 C) 98.9 F (37.2 C)  TempSrc: Oral  Oral Oral  Resp: 15 15 18 20   Height: 5\' 9"  (1.753 m)     Weight: 126.916 kg (279 lb 12.8 oz)     SpO2: 95% 95% 96% 96%     EXAM: alert, oriented.    Intake/Output Summary (Last 24 hours) at 08/07/13 1015 Last data filed at 08/07/13 0900  Gross per 24 hour  Intake    240 ml  Output      0 ml  Net    240 ml    Lab Results:  Recent Labs  08/07/13 0010  NA 135  K 3.9  CL 99  CO2 23  GLUCOSE 355*  BUN 22  CREATININE 1.15  CALCIUM 9.0    Recent Labs  08/07/13 0010  AST 45*  ALT 45  ALKPHOS 56  BILITOT 0.3  PROT 7.5  ALBUMIN 3.6   No results found for this basename: LIPASE, AMYLASE,  in the last 72 hours  Recent  Labs  08/07/13 0010  WBC 5.5  NEUTROABS 3.2  HGB 11.3*  HCT 34.2*  MCV 84.2  PLT 156    Recent Labs  08/07/13 0010  TROPONINI <0.30   BNP No results found for this basename: probnp   No results found for this basename: DDIMER,  in the last 72 hours No results found for this basename: HGBA1C,  in the last 72 hours  Recent Labs  08/07/13 0610  CHOL 200  HDL 30*  LDLCALC 125*  TRIG 225*  CHOLHDL 6.7   No results found for this basename: TSH, T4TOTAL, FREET3, T3FREE, THYROIDAB,  in the last 72 hours  Recent Labs  08/07/13 0610  RETICCTPCT 2.5    Studies/Results: Ct Head Wo Contrast  08/07/2013   CLINICAL DATA:  Dizziness, vomiting  EXAM: CT HEAD WITHOUT CONTRAST  TECHNIQUE: Contiguous axial images were obtained from the base of the skull through the vertex without contrast.  COMPARISON:  02/28/2012  FINDINGS: Mild brain atrophy and chronic white matter microvascular ischemic changes diffusely. Mild associated ventricular enlargement. No acute intracranial hemorrhage, definite infarction, mass lesion, midline shift, herniation, or extra-axial fluid collection. No focal edema or mass effect. Cisterns  are patent. Cerebellar atrophy as well. Atherosclerosis of the intracranial vessels. Mastoids and sinuses clear.  IMPRESSION: Stable atrophy and chronic microvascular ischemic disease.  No acute finding by noncontrast CT   Electronically Signed   By: Ruel Favors M.D.   On: 08/07/2013 01:16   Medications: Medications administered in the last 24 hours reviewed.  Current Medication List reviewed.    LOS: 1 day   [facility] @ [facility] [phone]) 08/07/2013, 10:15 AM

## 2013-08-07 NOTE — ED Notes (Signed)
Family at bedside. 

## 2013-08-07 NOTE — ED Notes (Signed)
CBG-329. Notified RN

## 2013-08-08 DIAGNOSIS — I639 Cerebral infarction, unspecified: Secondary | ICD-10-CM

## 2013-08-08 DIAGNOSIS — R42 Dizziness and giddiness: Secondary | ICD-10-CM | POA: Diagnosis not present

## 2013-08-08 LAB — GLUCOSE, CAPILLARY: Glucose-Capillary: 230 mg/dL — ABNORMAL HIGH (ref 70–99)

## 2013-08-08 MED ORDER — PANTOPRAZOLE SODIUM 40 MG PO TBEC: 40.0000 mg | DELAYED_RELEASE_TABLET | Freq: Every day | ORAL | Status: DC

## 2013-08-08 MED ORDER — GLYBURIDE 5 MG PO TABS: 5.0000 mg | ORAL_TABLET | Freq: Two times a day (BID) | ORAL | Status: DC

## 2013-08-08 MED ORDER — INSULIN ASPART 100 UNIT/ML ~~LOC~~ SOLN: 0.0000 [IU] | Freq: Every day | SUBCUTANEOUS | Status: DC

## 2013-08-08 MED ORDER — FEBUXOSTAT 40 MG PO TABS: 80.0000 mg | ORAL_TABLET | Freq: Every day | ORAL | Status: DC

## 2013-08-08 MED ORDER — APIXABAN 5 MG PO TABS: 5.0000 mg | ORAL_TABLET | Freq: Two times a day (BID) | ORAL | Status: DC

## 2013-08-08 MED ORDER — TAMSULOSIN HCL 0.4 MG PO CAPS: 0.4000 mg | ORAL_CAPSULE | Freq: Every day | ORAL | Status: AC

## 2013-08-08 MED ORDER — LORATADINE 10 MG PO TABS: 10.0000 mg | ORAL_TABLET | Freq: Every day | ORAL | Status: DC

## 2013-08-08 MED ORDER — INSULIN ASPART 100 UNIT/ML ~~LOC~~ SOLN: 0.0000 [IU] | Freq: Three times a day (TID) | SUBCUTANEOUS | Status: DC

## 2013-08-08 MED ORDER — DILTIAZEM HCL ER COATED BEADS 360 MG PO CP24: 360.0000 mg | ORAL_CAPSULE | Freq: Every day | ORAL | Status: DC

## 2013-08-08 NOTE — Consult Note (Addendum)
Referring Physician: Kevan Ny    Chief Complaint: transient dizziness (left Cerebellar CVA)  HPI:                                                                                                                                         Oscar [name] is an 67 y.o. male who noted Saturday night that he suddenly became dizzy when he laid down.  He took a shower, laid down to rest and noted HE felt like he was spinning (internal vertigo).  He started to feel nauseated and threw up.  Wife called EMS and patient was brought to ED.  MRI confirms a left cerebellar infarct.  Patient is currently on Eliquis for Afib (which he curently is in on telemetry). Currently he feels back to his baseline and has no further complaints.    A1c 10.1 LDL 125 Carotid dopplers --1-39% bilaterally  2 D echo on 06/2013 --Study Conclusions  - Left ventricle: The cavity size was mildly dilated. Wall thickness was normal. Systolic function was normal. The estimated ejection fraction was in the range of 55% to 60%. Wall motion was normal; there were no regional wall motion abnormalities. Indeterminant diastolic function (atrial fibrillation).   Date last known well: Date: 08/06/2013 Time last known well: Time: 23:30 tPA Given: No: on eliquis  Past Medical History  Diagnosis Date  . Diabetes mellitus without complication     improved after diet/exercise  . Hypertension     improved after diet/exercise  . Kidney stones   . GERD (gastroesophageal reflux disease)   . H/O hiatal hernia   . Arthritis     hips  . OSA (obstructive sleep apnea)     wears cpap  . Anal fissure     h/o - no recent complications  . Atrial fibrillation     Past Surgical History  Procedure Laterality Date  . Cholecystectomy    . Knee arthroscopy    . Carpel tunnel    . Ligament repair    . Kidney stone surgery      Family History  Problem Relation Age of Onset  . COPD Father   . Heart attack Sister     died age 61 of MI    Social History:  reports that he has never smoked. He has never used smokeless tobacco. He reports that he drinks alcohol. He reports that he does not use illicit drugs.  Allergies:  Allergies  Allergen Reactions  . Iohexol      Desc: HIVES S/P 13HR.PREMEDS,PT WEIGHS 367LBS,?LOW DOSAGE PREMEDS   . Ivp Dye [Iodinated Diagnostic Agents]     welps     Medications:  Scheduled: . apixaban  5 mg Oral BID  . diltiazem  360 mg Oral Daily  . febuxostat  80 mg Oral Daily  . glyBURIDE  5 mg Oral BID WC  . insulin aspart  0-5 Units Subcutaneous QHS  . insulin aspart  0-9 Units Subcutaneous TID WC  . loratadine  10 mg Oral Daily  . pantoprazole  40 mg Oral Daily  . tamsulosin  0.4 mg Oral QPC breakfast    ROS:                                                                                                                                       History obtained from the patient  General ROS: negative for - chills, fatigue, fever, night sweats, weight gain or weight loss Psychological ROS: negative for - behavioral disorder, hallucinations, memory difficulties, mood swings or suicidal ideation Ophthalmic ROS: negative for - blurry vision, double vision, eye pain or loss of vision ENT ROS: negative for - epistaxis, nasal discharge, oral lesions, sore throat, tinnitus or vertigo Allergy and Immunology ROS: negative for - hives or itchy/watery eyes Hematological and Lymphatic ROS: negative for - bleeding problems, bruising or swollen lymph nodes Endocrine ROS: negative for - galactorrhea, hair pattern changes, polydipsia/polyuria or temperature intolerance Respiratory ROS: negative for - cough, hemoptysis, shortness of breath or wheezing Cardiovascular ROS: negative for - chest pain, dyspnea on exertion, edema or irregular heartbeat Gastrointestinal ROS: negative for -  abdominal pain, diarrhea, hematemesis, nausea/vomiting or stool incontinence Genito-Urinary ROS: negative for - dysuria, hematuria, incontinence or urinary frequency/urgency Musculoskeletal ROS: negative for - joint swelling or muscular weakness Neurological ROS: as noted in HPI Dermatological ROS: negative for rash and skin lesion changes  Neurologic Examination:                                                                                                      Blood pressure 117/69, pulse 81, temperature 98.4 F (36.9 C), temperature source Oral, resp. rate 20, height 5\' 9"  (1.753 m), weight 127.778 kg (281 lb 11.2 oz), SpO2 97.00%.   Mental Status: Alert, oriented, thought content appropriate.  Speech fluent without evidence of aphasia.  Able to follow 3 step commands without difficulty. Cranial Nerves: II: Discs flat bilaterally; Visual fields grossly normal, pupils equal, round, reactive to light and accommodation III,IV, VI: ptosis not present, extra-ocular motions intact bilaterally with hypermetric saccades when looking to the left V,VII: smile symmetric, facial light touch sensation normal bilaterally VIII: hearing  normal bilaterally IX,X: gag reflex present XI: bilateral shoulder shrug XII: midline tongue extension without atrophy or fasciculations  Motor: Right : Upper extremity   5/5    Left:     Upper extremity   5/5  Lower extremity   5/5     Lower extremity   5/5 Tone and bulk:normal tone throughout; no atrophy noted Sensory: decreased sensation from foot to mid calf to both vibration and temperature Deep Tendon Reflexes:  Right: Upper Extremity   Left: Upper extremity   biceps (C-5 to C-6) 2/4   biceps (C-5 to C-6) 2/4 tricep (C7) 2/4    triceps (C7) 2/4 Brachioradialis (C6) 2/4  Brachioradialis (C6) 2/4  Lower Extremity Lower Extremity  quadriceps (L-2 to L-4) 0/4   quadriceps (L-2 to L-4) 0/4 Achilles (S1) 0/4   Achilles (S1) 0/4  Plantars: equivocal  bilaterally Cerebellar: normal finger-to-nose,  normal heel-to-shin test Gait: difficulty with tandem gait falling to both sides.  CV: pulses palpable throughout    Results for orders placed during the hospital encounter of 08/06/13 (from the past 48 hour(s))  ETHANOL     Status: None   Collection Time    08/07/13 12:10 AM      Result Value Range   Alcohol, Ethyl (B) <11  0 - 11 mg/dL   Comment:            LOWEST DETECTABLE LIMIT FOR     SERUM ALCOHOL IS 11 mg/dL     FOR MEDICAL PURPOSES ONLY  PROTIME-INR     Status: None   Collection Time    08/07/13 12:10 AM      Result Value Range   Prothrombin Time 14.2  11.6 - 15.2 seconds   INR 1.12  0.00 - 1.49  APTT     Status: None   Collection Time    08/07/13 12:10 AM      Result Value Range   aPTT 25  24 - 37 seconds  CBC     Status: Abnormal   Collection Time    08/07/13 12:10 AM      Result Value Range   WBC 5.5  4.0 - 10.5 K/uL   RBC 4.06 (*) 4.22 - 5.81 MIL/uL   Hemoglobin 11.3 (*) 13.0 - 17.0 g/dL   HCT 16.1 (*) 09.6 - 04.5 %   MCV 84.2  78.0 - 100.0 fL   MCH 27.8  26.0 - 34.0 pg   MCHC 33.0  30.0 - 36.0 g/dL   RDW 40.9  81.1 - 91.4 %   Platelets 156  150 - 400 K/uL  DIFFERENTIAL     Status: None   Collection Time    08/07/13 12:10 AM      Result Value Range   Neutrophils Relative % 59  43 - 77 %   Neutro Abs 3.2  1.7 - 7.7 K/uL   Lymphocytes Relative 29  12 - 46 %   Lymphs Abs 1.6  0.7 - 4.0 K/uL   Monocytes Relative 9  3 - 12 %   Monocytes Absolute 0.5  0.1 - 1.0 K/uL   Eosinophils Relative 3  0 - 5 %   Eosinophils Absolute 0.2  0.0 - 0.7 K/uL   Basophils Relative 0  0 - 1 %   Basophils Absolute 0.0  0.0 - 0.1 K/uL  COMPREHENSIVE METABOLIC PANEL     Status: Abnormal   Collection Time    08/07/13 12:10 AM      Result Value  Range   Sodium 135  135 - 145 mEq/L   Potassium 3.9  3.5 - 5.1 mEq/L   Chloride 99  96 - 112 mEq/L   CO2 23  19 - 32 mEq/L   Glucose, Bld 355 (*) 70 - 99 mg/dL   BUN 22  6 - 23 mg/dL    Creatinine, Ser 4.78  0.50 - 1.35 mg/dL   Calcium 9.0  8.4 - 29.5 mg/dL   Total Protein 7.5  6.0 - 8.3 g/dL   Albumin 3.6  3.5 - 5.2 g/dL   AST 45 (*) 0 - 37 U/L   Comment: HEMOLYSIS AT THIS LEVEL MAY AFFECT RESULT   ALT 45  0 - 53 U/L   Alkaline Phosphatase 56  39 - 117 U/L   Total Bilirubin 0.3  0.3 - 1.2 mg/dL   GFR calc non Af Amer 64 (*) >90 mL/min   GFR calc Af Amer 74 (*) >90 mL/min   Comment: (NOTE)     The eGFR has been calculated using the CKD EPI equation.     This calculation has not been validated in all clinical situations.     eGFR's persistently <90 mL/min signify possible Chronic Kidney     Disease.  TROPONIN I     Status: None   Collection Time    08/07/13 12:10 AM      Result Value Range   Troponin I <0.30  <0.30 ng/mL   Comment:            Due to the release kinetics of cTnI,     a negative result within the first hours     of the onset of symptoms does not rule out     myocardial infarction with certainty.     If myocardial infarction is still suspected,     repeat the test at appropriate intervals.  GLUCOSE, CAPILLARY     Status: Abnormal   Collection Time    08/07/13 12:23 AM      Result Value Range   Glucose-Capillary 329 (*) 70 - 99 mg/dL  URINE RAPID DRUG SCREEN (HOSP PERFORMED)     Status: None   Collection Time    08/07/13  2:01 AM      Result Value Range   Opiates NONE DETECTED  NONE DETECTED   Cocaine NONE DETECTED  NONE DETECTED   Benzodiazepines NONE DETECTED  NONE DETECTED   Amphetamines NONE DETECTED  NONE DETECTED   Tetrahydrocannabinol NONE DETECTED  NONE DETECTED   Barbiturates NONE DETECTED  NONE DETECTED   Comment:            DRUG SCREEN FOR MEDICAL PURPOSES     ONLY.  IF CONFIRMATION IS NEEDED     FOR ANY PURPOSE, NOTIFY LAB     WITHIN 5 DAYS.                LOWEST DETECTABLE LIMITS     FOR URINE DRUG SCREEN     Drug Class       Cutoff (ng/mL)     Amphetamine      1000     Barbiturate      200     Benzodiazepine   200      Tricyclics       300     Opiates          300     Cocaine          300     THC  50  URINALYSIS, ROUTINE W REFLEX MICROSCOPIC     Status: Abnormal   Collection Time    08/07/13  2:01 AM      Result Value Range   Color, Urine YELLOW  YELLOW   APPearance CLEAR  CLEAR   Specific Gravity, Urine 1.026  1.005 - 1.030   pH 5.5  5.0 - 8.0   Glucose, UA >1000 (*) NEGATIVE mg/dL   Hgb urine dipstick SMALL (*) NEGATIVE   Bilirubin Urine NEGATIVE  NEGATIVE   Ketones, ur NEGATIVE  NEGATIVE mg/dL   Protein, ur NEGATIVE  NEGATIVE mg/dL   Urobilinogen, UA 0.2  0.0 - 1.0 mg/dL   Nitrite NEGATIVE  NEGATIVE   Leukocytes, UA NEGATIVE  NEGATIVE  URINE MICROSCOPIC-ADD ON     Status: None   Collection Time    08/07/13  2:01 AM      Result Value Range   Squamous Epithelial / LPF RARE  RARE   WBC, UA 0-2  <3 WBC/hpf   RBC / HPF 7-10  <3 RBC/hpf   Bacteria, UA RARE  RARE  VITAMIN B12     Status: None   Collection Time    08/07/13  6:10 AM      Result Value Range   Vitamin B-12 429  211 - 911 pg/mL   Comment: Performed at [facility]  FOLATE     Status: None   Collection Time    08/07/13  6:10 AM      Result Value Range   Folate 13.3     Comment: (NOTE)     Reference Ranges            Deficient:       0.4 - 3.3 ng/mL            Indeterminate:   3.4 - 5.4 ng/mL            Normal:              > 5.4 ng/mL     Performed at [facility]  IRON AND TIBC     Status: Abnormal   Collection Time    08/07/13  6:10 AM      Result Value Range   Iron 48  42 - 135 ug/dL   TIBC 161  096 - 045 ug/dL   Saturation Ratios 12 (*) 20 - 55 %   UIBC 338  125 - 400 ug/dL   Comment: Performed at [facility]  FERRITIN     Status: Abnormal   Collection Time    08/07/13  6:10 AM      Result Value Range   Ferritin 15 (*) 22 - 322 ng/mL   Comment: Performed at [facility]  RETICULOCYTES     Status: Abnormal   Collection Time    08/07/13  6:10 AM      Result Value  Range   Retic Ct Pct 2.5  0.4 - 3.1 %   RBC. 4.20 (*) 4.22 - 5.81 MIL/uL   Retic Count, Manual 105.0  19.0 - 186.0 K/uL  HEMOGLOBIN A1C     Status: Abnormal   Collection Time    08/07/13  6:10 AM      Result Value Range   Hemoglobin A1C 10.1 (*) <5.7 %   Comment: (NOTE)  According to the ADA Clinical Practice Recommendations for 2011, when     HbA1c is used as a screening test:      >=6.5%   Diagnostic of Diabetes Mellitus               (if abnormal result is confirmed)     5.7-6.4%   Increased risk of developing Diabetes Mellitus     References:Diagnosis and Classification of Diabetes Mellitus,Diabetes     Care,2011,34(Suppl 1):S62-S69 and Standards of Medical Care in             Diabetes - 2011,Diabetes Care,2011,34 (Suppl 1):S11-S61.   Mean Plasma Glucose 243 (*) <117 mg/dL   Comment: Performed at [facility]  LIPID PANEL     Status: Abnormal   Collection Time    08/07/13  6:10 AM      Result Value Range   Cholesterol 200  0 - 200 mg/dL   Triglycerides 161 (*) <150 mg/dL   HDL 30 (*) >09 mg/dL   Total CHOL/HDL Ratio 6.7     VLDL 45 (*) 0 - 40 mg/dL   LDL Cholesterol 604 (*) 0 - 99 mg/dL   Comment:            Total Cholesterol/HDL:CHD Risk     Coronary Heart Disease Risk Table                         Men   Women      1/2 Average Risk   3.4   3.3      Average Risk       5.0   4.4      2 X Average Risk   9.6   7.1      3 X Average Risk  23.4   11.0                Use the calculated Patient Ratio     above and the CHD Risk Table     to determine the patient's CHD Risk.                ATP III CLASSIFICATION (LDL):      <100     mg/dL   Optimal      540-981  mg/dL   Near or Above                        Optimal      130-159  mg/dL   Borderline      191-478  mg/dL   High      >295     mg/dL   Very High  GLUCOSE, CAPILLARY     Status: Abnormal   Collection Time    08/07/13  7:56 AM       Result Value Range   Glucose-Capillary 297 (*) 70 - 99 mg/dL   Comment 1 Notify RN    GLUCOSE, CAPILLARY     Status: Abnormal   Collection Time    08/07/13 12:06 PM      Result Value Range   Glucose-Capillary 280 (*) 70 - 99 mg/dL   Comment 1 Notify RN    GLUCOSE, CAPILLARY     Status: Abnormal   Collection Time    08/07/13  4:42 PM      Result Value Range   Glucose-Capillary 263 (*) 70 - 99 mg/dL   Comment 1 Notify RN    GLUCOSE, CAPILLARY  Status: Abnormal   Collection Time    08/07/13  9:00 PM      Result Value Range   Glucose-Capillary 309 (*) 70 - 99 mg/dL  GLUCOSE, CAPILLARY     Status: Abnormal   Collection Time    08/08/13  7:51 AM      Result Value Range   Glucose-Capillary 230 (*) 70 - 99 mg/dL   Comment 1 Documented in Chart     Comment 2 Notify RN     Ct Head Wo Contrast  08/07/2013   CLINICAL DATA:  Dizziness, vomiting  EXAM: CT HEAD WITHOUT CONTRAST  TECHNIQUE: Contiguous axial images were obtained from the base of the skull through the vertex without contrast.  COMPARISON:  02/28/2012  FINDINGS: Mild brain atrophy and chronic white matter microvascular ischemic changes diffusely. Mild associated ventricular enlargement. No acute intracranial hemorrhage, definite infarction, mass lesion, midline shift, herniation, or extra-axial fluid collection. No focal edema or mass effect. Cisterns are patent. Cerebellar atrophy as well. Atherosclerosis of the intracranial vessels. Mastoids and sinuses clear.  IMPRESSION: Stable atrophy and chronic microvascular ischemic disease.  No acute finding by noncontrast CT   Electronically Signed   By: Ruel Favors M.D.   On: 08/07/2013 01:16   Mri Brain Without Contrast  08/07/2013   CLINICAL DATA:  TIA.  Dizziness.  EXAM: MRI HEAD WITHOUT CONTRAST  MRA HEAD WITHOUT CONTRAST  TECHNIQUE: Multiplanar, multiecho pulse sequences of the brain and surrounding structures were obtained without intravenous contrast. Angiographic images of  the head were obtained using MRA technique without contrast.  COMPARISON:  Head CT 08/07/2013.  MRI 03/07/2012.  FINDINGS: MRI HEAD FINDINGS  There is a 2 cm acute infarction within the inferior cerebellum on the left. No other acute infarction. The area shows mild swelling but there is no hemorrhage or mass effect.  There are chronic small-vessel changes affecting the pons. The cerebral hemispheres show generalized atrophy with chronic small vessel disease affecting the deep and subcortical white matter. No mass lesion, hydrocephalus or extra-axial collection. No pituitary mass. No inflammatory sinus disease.  MRA HEAD FINDINGS  Both internal carotid arteries are widely patent into the brain. No siphon stenosis. The anterior and middle cerebral vessels are patent without proximal stenosis, aneurysm or vascular malformation.  The left vertebral artery is a large vessel widely patent to the basilar. No flow is seen in the left posterior inferior cerebellar artery. The right vertebral artery is a small-vessel that terminates in the posterior inferior cerebellar artery on that side. There is atherosclerotic irregularity and narrowing of the proximal basilar artery, the stenosis is no greater than 30-50%. The superior cerebellar and posterior cerebral arteries are patent bilaterally, though there is atherosclerotic irregularity. The posterior cerebral arteries receive most a there supply from the anterior circulation.  IMPRESSION: 2 cm acute infarction at the inferior cerebellum on the left. Mild swelling but no mass effect or hemorrhage.  No flow demonstrated in the left posterior inferior cerebellar artery at MR angiography.   Electronically Signed   By: Paulina Fusi M.D.   On: 08/07/2013 18:27   Mr Maxine Glenn Head/brain Wo Cm  08/07/2013   CLINICAL DATA:  TIA.  Dizziness.  EXAM: MRI HEAD WITHOUT CONTRAST  MRA HEAD WITHOUT CONTRAST  TECHNIQUE: Multiplanar, multiecho pulse sequences of the brain and surrounding structures  were obtained without intravenous contrast. Angiographic images of the head were obtained using MRA technique without contrast.  COMPARISON:  Head CT 08/07/2013.  MRI 03/07/2012.  FINDINGS: MRI  HEAD FINDINGS  There is a 2 cm acute infarction within the inferior cerebellum on the left. No other acute infarction. The area shows mild swelling but there is no hemorrhage or mass effect.  There are chronic small-vessel changes affecting the pons. The cerebral hemispheres show generalized atrophy with chronic small vessel disease affecting the deep and subcortical white matter. No mass lesion, hydrocephalus or extra-axial collection. No pituitary mass. No inflammatory sinus disease.  MRA HEAD FINDINGS  Both internal carotid arteries are widely patent into the brain. No siphon stenosis. The anterior and middle cerebral vessels are patent without proximal stenosis, aneurysm or vascular malformation.  The left vertebral artery is a large vessel widely patent to the basilar. No flow is seen in the left posterior inferior cerebellar artery. The right vertebral artery is a small-vessel that terminates in the posterior inferior cerebellar artery on that side. There is atherosclerotic irregularity and narrowing of the proximal basilar artery, the stenosis is no greater than 30-50%. The superior cerebellar and posterior cerebral arteries are patent bilaterally, though there is atherosclerotic irregularity. The posterior cerebral arteries receive most a there supply from the anterior circulation.  IMPRESSION: 2 cm acute infarction at the inferior cerebellum on the left. Mild swelling but no mass effect or hemorrhage.  No flow demonstrated in the left posterior inferior cerebellar artery at MR angiography.   Electronically Signed   By: Paulina Fusi M.D.   On: 08/07/2013 18:27    Assessment and plan discussed with with attending physician and they are in agreement.    Felicie Morn PA-C [facility] [phone]  08/08/2013, 9:59 AM   Assessment: 67 y.o. 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 The PICA does supply more than the area infarcted which could suggest a slow occlusion from atherosclerotic disease, though embolus from atrial fibrillation is also possible. I do not think there is any evidence to suggest changing from one anticoagulant to another in the setting of failure, and am not even certain that this represents eliquis failure.    Stroke Risk Factors - atrial fibrillation, diabetes mellitus and hyperlipidemia  1) LDL 128, patient will need to be discharged on a statin. 2) A1C 10, needs better control 3) Secondary prevention with eliquis.  4) I think that anticoagulation would be indicated in any case, and therefore TTE would not be necessary.  5) PT for balance   Ritta Slot, MD [facility] [phone]  If 7pm- 7am, please page neurology on call at [phone].

## 2013-08-08 NOTE — Discharge Summary (Signed)
Physician Discharge Summary  NAME:Oscar [name]  GEX:[id number]  DOB: [birth year]   Admit date: 08/06/2013 Discharge date: 08/08/2013  Discharge Diagnoses:  Principal Problem:   Dizziness - is secondary to left cerebellar thrombotic CVA, likely secondary to small vessel disease Active Problems:   Diabetes mellitus type 2, uncontrolled, with complications   CKD (chronic kidney disease), stage II   Atrial fibrillation   Hypertension   Anemia   CVA (cerebral infarction) - as above   Discharge Physical Exam:  General Appearance: Alert, cooperative, no distress, appears stated age  Weight change: -1.497 kg (-3 lb 4.8 oz)  Intake/Output Summary (Last 24 hours) at 08/08/13 1446 Last data filed at 08/07/13 1700  Gross per 24 hour  Intake    360 ml  Output      0 ml  Net    360 ml   Filed Vitals:   08/08/13 0056 08/08/13 0606 08/08/13 0749 08/08/13 1213  BP: 99/65 99/47 117/69 125/82  Pulse: 75 86 81 93  Temp: 97.5 F (36.4 C) 98.7 F (37.1 C) 98.4 F (36.9 C) 97.8 F (36.6 C)  TempSrc: Oral Oral Oral Oral  Resp: 18 18 20 20   Height:      Weight:  127.778 kg (281 lb 11.2 oz)    SpO2: 95% 96% 97% 97%    As per neurology mid a.m.:  Mental Status:  Alert, oriented, thought content appropriate. Speech fluent without evidence of aphasia. Able to follow 3 step commands without difficulty.  Cranial Nerves:  II: Discs flat bilaterally; Visual fields grossly normal, pupils equal, round, reactive to light and accommodation  III,IV, VI: ptosis not present, extra-ocular motions intact bilaterally with hypermetric saccades when looking to the left  V,VII: smile symmetric, facial light touch sensation normal bilaterally  VIII: hearing normal bilaterally  IX,X: gag reflex present  XI: bilateral shoulder shrug  XII: midline tongue extension without atrophy or fasciculations  Motor:  Right : Upper extremity 5/5 Left: Upper extremity 5/5  Lower extremity 5/5 Lower extremity 5/5   Tone and bulk:normal tone throughout; no atrophy noted  Sensory: decreased sensation from foot to mid calf to both vibration and temperature  Deep Tendon Reflexes:  Right: Upper Extremity Left: Upper extremity  biceps (C-5 to C-6) 2/4 biceps (C-5 to C-6) 2/4  tricep (C7) 2/4 triceps (C7) 2/4  Brachioradialis (C6) 2/4 Brachioradialis (C6) 2/4  Lower Extremity Lower Extremity  quadriceps (L-2 to L-4) 0/4 quadriceps (L-2 to L-4) 0/4  Achilles (S1) 0/4 Achilles (S1) 0/4  Plantars:  equivocal bilaterally  Cerebellar:  normal finger-to-nose, normal heel-to-shin test  Gait: difficulty with tandem gait falling to both sides.  CV: pulses palpable throughout   Discharge Condition: improved  Hospital Course: Oscar [name] is a very 67 y.o. male with a history of atrial fthisibrillation and DM2 who presented to the [facility] ED night before last with complaint of sudden onset of dizziness when he was about to lay down to go to sleep around 10:30pm. He then tried to get up and had difficulty walking due to the dizziness and he was having to hold on to the walls because he was unsteady. He denied having any chest pain, headache, or fever or chills. He also denied having nausea or vomiting. He did have diaphoresis and SOB. He denied that the symptom was worse with turning of his head but he reports that his dizziness was worse with laying down.  In the ED , he was evaluated  and a CT scan of the Head was performed which was negative for acute findings, however subsequent MRI MRA of the brain revealed a 2 cm thrombotic stroke in the left medial cerebellum. Symptoms of "drunkenness" and incoordination have improved and he is ambulating well currently without vertigo. He was seen in consultation by Dr. Onalee Hua [facility] who recommends continuing his Eliquis am not repeating a 2-D echo. His LDL is 128 and needs to be improved upon and we will initiate a  statin therapy if he can tolerate it. Her diabetic control needs to improve as well since hemoglobin A1c is 10. We'll plan for further physical therapy as an outpatient. Followup in office in one week  Things to follow up in the outpatient setting: Steadiness of gait and visual changes as well as any symptoms of disequilibrium or vertigo. We'll need to make sure that diabetes and cholesterol are under good control  Consults: Treatment Team:  Kym Groom, MD - Dr. Onalee Hua  Disposition: 01-Home or Self Care  Discharge Orders   Future Appointments Provider Department Dept Phone   09/20/2013 10:45 AM Marinus Maw, MD [facility] [place] Office [phone]   Future Orders Complete By Expires   Call MD for:  difficulty breathing, headache or visual disturbances  As directed    Call MD for:  extreme fatigue  As directed    Call MD for:  persistant nausea and vomiting  As directed    Call MD for:  redness, tenderness, or signs of infection (pain, swelling, redness, odor or green/yellow discharge around incision site)  As directed    Call MD for:  temperature >100.4  As directed    Diet - low sodium heart healthy  As directed    Discharge instructions  As directed    Comments:     Call Dr. Kevan Ny for sudden recurrence of symptoms of incoordination or vertigo or for nausea and vomiting or acute focal weakness in an extremity or face muscle etc.   Increase activity slowly  As directed        Medication List    STOP taking these medications       ALLEGRA PO  Replaced by:  loratadine 10 MG tablet     fluorouracil 5 % cream  Commonly known as:  EFUDEX      TAKE these medications       apixaban 5 MG Tabs tablet  Commonly known as:  ELIQUIS  Take 1 tablet (5 mg total) by mouth 2 (two) times daily.     diltiazem 360 MG 24 hr capsule  Commonly known as:  CARDIZEM CD  Take 1 capsule (360 mg total) by mouth daily.     febuxostat 40 MG tablet  Commonly known as:   ULORIC  Take 2 tablets (80 mg total) by mouth daily.     glyBURIDE 5 MG tablet  Commonly known as:  DIABETA  Take 1 tablet (5 mg total) by mouth 2 (two) times daily with a meal.     indomethacin 50 MG capsule  Commonly known as:  INDOCIN  Take 50-150 mg by mouth daily as needed (for gout flare ups     start with 3 capsules, then taper down daily to 1 capsule).     insulin aspart 100 UNIT/ML injection  Commonly known as:  novoLOG  Inject 0-9 Units into the skin 3 (three) times daily with meals.     insulin aspart 100 UNIT/ML injection  Commonly known as:  novoLOG  Inject 0-5 Units into the skin at bedtime.     loratadine 10 MG tablet  Commonly known as:  CLARITIN  Take 1 tablet (10 mg total) by mouth daily.     metFORMIN 1000 MG tablet  Commonly known as:  GLUCOPHAGE  Take 1,000 mg by mouth 2 (two) times daily with a meal.     pantoprazole 40 MG tablet  Commonly known as:  PROTONIX  Take 1 tablet (40 mg total) by mouth daily.     tamsulosin 0.4 MG Caps capsule  Commonly known as:  FLOMAX  Take 1 capsule (0.4 mg total) by mouth daily after breakfast.         The results of significant diagnostics from this hospitalization (including imaging, microbiology, ancillary and laboratory) are listed below for reference.    Significant Diagnostic Studies: Ct Head Wo Contrast  08/07/2013   CLINICAL DATA:  Dizziness, vomiting  EXAM: CT HEAD WITHOUT CONTRAST  TECHNIQUE: Contiguous axial images were obtained from the base of the skull through the vertex without contrast.  COMPARISON:  02/28/2012  FINDINGS: Mild brain atrophy and chronic white matter microvascular ischemic changes diffusely. Mild associated ventricular enlargement. No acute intracranial hemorrhage, definite infarction, mass lesion, midline shift, herniation, or extra-axial fluid collection. No focal edema or mass effect. Cisterns are patent. Cerebellar atrophy as well. Atherosclerosis of the intracranial vessels. Mastoids  and sinuses clear.  IMPRESSION: Stable atrophy and chronic microvascular ischemic disease.  No acute finding by noncontrast CT   Electronically Signed   By: Ruel Favors M.D.   On: 08/07/2013 01:16   Mri Brain Without Contrast  08/07/2013   CLINICAL DATA:  TIA.  Dizziness.  EXAM: MRI HEAD WITHOUT CONTRAST  MRA HEAD WITHOUT CONTRAST  TECHNIQUE: Multiplanar, multiecho pulse sequences of the brain and surrounding structures were obtained without intravenous contrast. Angiographic images of the head were obtained using MRA technique without contrast.  COMPARISON:  Head CT 08/07/2013.  MRI 03/07/2012.  FINDINGS: MRI HEAD FINDINGS  There is a 2 cm acute infarction within the inferior cerebellum on the left. No other acute infarction. The area shows mild swelling but there is no hemorrhage or mass effect.  There are chronic small-vessel changes affecting the pons. The cerebral hemispheres show generalized atrophy with chronic small vessel disease affecting the deep and subcortical white matter. No mass lesion, hydrocephalus or extra-axial collection. No pituitary mass. No inflammatory sinus disease.  MRA HEAD FINDINGS  Both internal carotid arteries are widely patent into the brain. No siphon stenosis. The anterior and middle cerebral vessels are patent without proximal stenosis, aneurysm or vascular malformation.  The left vertebral artery is a large vessel widely patent to the basilar. No flow is seen in the left posterior inferior cerebellar artery. The right vertebral artery is a small-vessel that terminates in the posterior inferior cerebellar artery on that side. There is atherosclerotic irregularity and narrowing of the proximal basilar artery, the stenosis is no greater than 30-50%. The superior cerebellar and posterior cerebral arteries are patent bilaterally, though there is atherosclerotic irregularity. The posterior cerebral arteries receive most a there supply from the anterior circulation.  IMPRESSION: 2  cm acute infarction at the inferior cerebellum on the left. Mild swelling but no mass effect or hemorrhage.  No flow demonstrated in the left posterior inferior cerebellar artery at MR angiography.   Electronically Signed   By: Paulina Fusi M.D.   On: 08/07/2013 18:27   Mr Maxine Glenn Head/brain ZO  Cm  08/07/2013   CLINICAL DATA:  TIA.  Dizziness.  EXAM: MRI HEAD WITHOUT CONTRAST  MRA HEAD WITHOUT CONTRAST  TECHNIQUE: Multiplanar, multiecho pulse sequences of the brain and surrounding structures were obtained without intravenous contrast. Angiographic images of the head were obtained using MRA technique without contrast.  COMPARISON:  Head CT 08/07/2013.  MRI 03/07/2012.  FINDINGS: MRI HEAD FINDINGS  There is a 2 cm acute infarction within the inferior cerebellum on the left. No other acute infarction. The area shows mild swelling but there is no hemorrhage or mass effect.  There are chronic small-vessel changes affecting the pons. The cerebral hemispheres show generalized atrophy with chronic small vessel disease affecting the deep and subcortical white matter. No mass lesion, hydrocephalus or extra-axial collection. No pituitary mass. No inflammatory sinus disease.  MRA HEAD FINDINGS  Both internal carotid arteries are widely patent into the brain. No siphon stenosis. The anterior and middle cerebral vessels are patent without proximal stenosis, aneurysm or vascular malformation.  The left vertebral artery is a large vessel widely patent to the basilar. No flow is seen in the left posterior inferior cerebellar artery. The right vertebral artery is a small-vessel that terminates in the posterior inferior cerebellar artery on that side. There is atherosclerotic irregularity and narrowing of the proximal basilar artery, the stenosis is no greater than 30-50%. The superior cerebellar and posterior cerebral arteries are patent bilaterally, though there is atherosclerotic irregularity. The posterior cerebral arteries  receive most a there supply from the anterior circulation.  IMPRESSION: 2 cm acute infarction at the inferior cerebellum on the left. Mild swelling but no mass effect or hemorrhage.  No flow demonstrated in the left posterior inferior cerebellar artery at MR angiography.   Electronically Signed   By: Paulina Fusi M.D.   On: 08/07/2013 18:27    Microbiology: No results found for this or any previous visit (from the past 240 hour(s)).   Labs: Results for orders placed during the hospital encounter of 08/06/13  ETHANOL      Result Value Range   Alcohol, Ethyl (B) <11  0 - 11 mg/dL  PROTIME-INR      Result Value Range   Prothrombin Time 14.2  11.6 - 15.2 seconds   INR 1.12  0.00 - 1.49  APTT      Result Value Range   aPTT 25  24 - 37 seconds  CBC      Result Value Range   WBC 5.5  4.0 - 10.5 K/uL   RBC 4.06 (*) 4.22 - 5.81 MIL/uL   Hemoglobin 11.3 (*) 13.0 - 17.0 g/dL   HCT 16.1 (*) 09.6 - 04.5 %   MCV 84.2  78.0 - 100.0 fL   MCH 27.8  26.0 - 34.0 pg   MCHC 33.0  30.0 - 36.0 g/dL   RDW 40.9  81.1 - 91.4 %   Platelets 156  150 - 400 K/uL  DIFFERENTIAL      Result Value Range   Neutrophils Relative % 59  43 - 77 %   Neutro Abs 3.2  1.7 - 7.7 K/uL   Lymphocytes Relative 29  12 - 46 %   Lymphs Abs 1.6  0.7 - 4.0 K/uL   Monocytes Relative 9  3 - 12 %   Monocytes Absolute 0.5  0.1 - 1.0 K/uL   Eosinophils Relative 3  0 - 5 %   Eosinophils Absolute 0.2  0.0 - 0.7 K/uL   Basophils Relative 0  0 -  1 %   Basophils Absolute 0.0  0.0 - 0.1 K/uL  COMPREHENSIVE METABOLIC PANEL      Result Value Range   Sodium 135  135 - 145 mEq/L   Potassium 3.9  3.5 - 5.1 mEq/L   Chloride 99  96 - 112 mEq/L   CO2 23  19 - 32 mEq/L   Glucose, Bld 355 (*) 70 - 99 mg/dL   BUN 22  6 - 23 mg/dL   Creatinine, Ser 1.61  0.50 - 1.35 mg/dL   Calcium 9.0  8.4 - 09.6 mg/dL   Total Protein 7.5  6.0 - 8.3 g/dL   Albumin 3.6  3.5 - 5.2 g/dL   AST 45 (*) 0 - 37 U/L   ALT 45  0 - 53 U/L   Alkaline Phosphatase  56  39 - 117 U/L   Total Bilirubin 0.3  0.3 - 1.2 mg/dL   GFR calc non Af Amer 64 (*) >90 mL/min   GFR calc Af Amer 74 (*) >90 mL/min  TROPONIN I      Result Value Range   Troponin I <0.30  <0.30 ng/mL  URINE RAPID DRUG SCREEN (HOSP PERFORMED)      Result Value Range   Opiates NONE DETECTED  NONE DETECTED   Cocaine NONE DETECTED  NONE DETECTED   Benzodiazepines NONE DETECTED  NONE DETECTED   Amphetamines NONE DETECTED  NONE DETECTED   Tetrahydrocannabinol NONE DETECTED  NONE DETECTED   Barbiturates NONE DETECTED  NONE DETECTED  URINALYSIS, ROUTINE W REFLEX MICROSCOPIC      Result Value Range   Color, Urine YELLOW  YELLOW   APPearance CLEAR  CLEAR   Specific Gravity, Urine 1.026  1.005 - 1.030   pH 5.5  5.0 - 8.0   Glucose, UA >1000 (*) NEGATIVE mg/dL   Hgb urine dipstick SMALL (*) NEGATIVE   Bilirubin Urine NEGATIVE  NEGATIVE   Ketones, ur NEGATIVE  NEGATIVE mg/dL   Protein, ur NEGATIVE  NEGATIVE mg/dL   Urobilinogen, UA 0.2  0.0 - 1.0 mg/dL   Nitrite NEGATIVE  NEGATIVE   Leukocytes, UA NEGATIVE  NEGATIVE  GLUCOSE, CAPILLARY      Result Value Range   Glucose-Capillary 329 (*) 70 - 99 mg/dL  URINE MICROSCOPIC-ADD ON      Result Value Range   Squamous Epithelial / LPF RARE  RARE   WBC, UA 0-2  <3 WBC/hpf   RBC / HPF 7-10  <3 RBC/hpf   Bacteria, UA RARE  RARE  VITAMIN B12      Result Value Range   Vitamin B-12 429  211 - 911 pg/mL  FOLATE      Result Value Range   Folate 13.3    IRON AND TIBC      Result Value Range   Iron 48  42 - 135 ug/dL   TIBC 045  409 - 811 ug/dL   Saturation Ratios 12 (*) 20 - 55 %   UIBC 338  125 - 400 ug/dL  FERRITIN      Result Value Range   Ferritin 15 (*) 22 - 322 ng/mL  RETICULOCYTES      Result Value Range   Retic Ct Pct 2.5  0.4 - 3.1 %   RBC. 4.20 (*) 4.22 - 5.81 MIL/uL   Retic Count, Manual 105.0  19.0 - 186.0 K/uL  HEMOGLOBIN A1C      Result Value Range   Hemoglobin A1C 10.1 (*) <5.7 %   Mean Plasma  Glucose 243 (*) <117  mg/dL  LIPID PANEL      Result Value Range   Cholesterol 200  0 - 200 mg/dL   Triglycerides 960 (*) <150 mg/dL   HDL 30 (*) >45 mg/dL   Total CHOL/HDL Ratio 6.7     VLDL 45 (*) 0 - 40 mg/dL   LDL Cholesterol 409 (*) 0 - 99 mg/dL  GLUCOSE, CAPILLARY      Result Value Range   Glucose-Capillary 297 (*) 70 - 99 mg/dL   Comment 1 Notify RN    GLUCOSE, CAPILLARY      Result Value Range   Glucose-Capillary 280 (*) 70 - 99 mg/dL   Comment 1 Notify RN    GLUCOSE, CAPILLARY      Result Value Range   Glucose-Capillary 263 (*) 70 - 99 mg/dL   Comment 1 Notify RN    GLUCOSE, CAPILLARY      Result Value Range   Glucose-Capillary 309 (*) 70 - 99 mg/dL  GLUCOSE, CAPILLARY      Result Value Range   Glucose-Capillary 230 (*) 70 - 99 mg/dL   Comment 1 Documented in Chart     Comment 2 Notify RN    GLUCOSE, CAPILLARY      Result Value Range   Glucose-Capillary 306 (*) 70 - 99 mg/dL   Comment 1 Documented in Chart     Comment 2 Notify RN      Time coordinating discharge: 40 minutes  Signed: Pearla Dubonnet, MD 08/08/2013, 2:46 PM

## 2013-08-08 NOTE — Evaluation (Signed)
Physical Therapy Evaluation Patient Details Name: Oscar [name] MRN: [id number] DOB: 1945-11-23 Today's Date: 08/08/2013 Time: 3086-5784 PT Time Calculation (min): 10 min  PT Assessment / Plan / Recommendation History of Present Illness  67 y.o. male with a history of Atrial Fibrillation and DM2 who presents to the ED with complaints of sudden onset of Dizziness when he was about to lay down [place] to sleep this evening around 10:30pm.   He then tried to get up and had difficulty walking due to the dizziness, he was having to hold on to the walls because he was unsteady.   He denies having any chest pain, headache, or fever or chills. He also denied having nausea or vomiting.  He did have diaphoresis and SOB. He denied that the symptom was worse with turning of his head but he reports that his dizziness was worse with laying down.  Clinical Impression  Pt is independent with all gait and mobility, no c/o dizziness during gait and stair negotiation.  Pt with no further PT needs identified at this time.    PT Assessment  Patent does not need any further PT services    Follow Up Recommendations  No PT follow up    Does the patient have the potential to tolerate intense rehabilitation      Barriers to Discharge        Equipment Recommendations  None recommended by PT    Recommendations for Other Services     Frequency      Precautions / Restrictions Precautions Precautions: None Restrictions Weight Bearing Restrictions: No   Pertinent Vitals/Pain No c/o pain      Mobility  Transfers Transfers: Sit to Stand;Stand to Sit Sit to Stand: 7: Independent Stand to Sit: 7: Independent Ambulation/Gait Ambulation/Gait Assistance: 7: Independent Ambulation Distance (Feet): 200 Feet Assistive device: None Stairs: Yes Stairs Assistance: 6: Modified independent (Device/Increase time) Stair Management Technique: One rail Right Number of Stairs: 4    Exercises     PT Diagnosis:     PT Problem List:   PT Treatment Interventions:       PT Goals(Current goals can be found in the care plan section)    Visit Information  Last PT Received On: 08/08/13 Assistance Needed: +1 History of Present Illness: 67 y.o. male with a history of Atrial Fibrillation and DM2 who presents to the ED with complaints of sudden onset of Dizziness when he was about to lay down [place] to sleep this evening around 10:30pm.   He then tried to get up and had difficulty walking due to the dizziness, he was having to hold on to the walls because he was unsteady.   He denies having any chest pain, headache, or fever or chills. He also denied having nausea or vomiting.  He did have diaphoresis and SOB. He denied that the symptom was worse with turning of his head but he reports that his dizziness was worse with laying down.       Prior Functioning  Home Living Family/patient expects to be discharged to:: Private residence Living Arrangements: Spouse/significant other Available Help at Discharge: Family Type of Home: House Home Access: Stairs to enter Secretary/administrator of Steps: 4 Entrance Stairs-Rails: Right;Left Home Layout: Able to live on main level with bedroom/bathroom Home Equipment: None Prior Function Level of Independence: Independent Communication Communication: No difficulties    Cognition  Cognition Arousal/Alertness: Awake/alert Behavior During Therapy: WFL for tasks assessed/performed Overall Cognitive Status: Within Functional Limits for tasks assessed  Extremity/Trunk Assessment Lower Extremity Assessment Lower Extremity Assessment: Overall WFL for tasks assessed Cervical / Trunk Assessment Cervical / Trunk Assessment: Normal   Balance Static Standing Balance Static Standing - Balance Support: During functional activity Static Standing - Level of Assistance: 7: Independent  End of Session PT - End of Session Equipment Utilized During Treatment: Gait belt Activity  Tolerance: Patient tolerated treatment well Patient left: in chair;with call bell/phone within reach;with family/visitor present Nurse Communication: Mobility status  GP Functional Assessment Tool Used: clinical judgement Functional Limitation: Mobility: Walking and moving around Mobility: Walking and Moving Around Current Status [phone]): 0 percent impaired, limited or restricted Mobility: Walking and Moving Around Goal Status [phone]): 0 percent impaired, limited or restricted Mobility: Walking and Moving Around Discharge Status [phone]): 0 percent impaired, limited or restricted   Oscar [name] 08/08/2013, 11:24 AM

## 2013-08-08 NOTE — Progress Notes (Signed)
Subjective: Oscar [name] is symptomatically much better from acute vertigo but he has had an acute left cerebellar stroke as per MRI yesterday and we will consult neurology. He has had 2 echocardiograms this year and I wonder if a TEE is appropriate at this time. Carotids did not show any significant obstruction  Objective: Weight change: -1.497 kg (-3 lb 4.8 oz)  Intake/Output Summary (Last 24 hours) at 08/08/13 1024 Last data filed at 08/07/13 1700  Gross per 24 hour  Intake    720 ml  Output      0 ml  Net    720 ml   Filed Vitals:   08/07/13 2229 08/08/13 0056 08/08/13 0606 08/08/13 0749  BP:  99/65 99/47 117/69  Pulse: 86 75 86 81  Temp:  97.5 F (36.4 C) 98.7 F (37.1 C) 98.4 F (36.9 C)  TempSrc:  Oral Oral Oral  Resp: 16 18 18 20   Height:      Weight:   127.778 kg (281 lb 11.2 oz)   SpO2: 97% 95% 96% 97%    General Appearance: Alert, cooperative, no distress, appears stated age Lungs: Clear to auscultation bilaterally, respirations unlabored Heart: Regular rate and rhythm, S1 and S2 normal, no murmur, rub or gallop Abdomen: Soft, non-tender, bowel sounds active all four quadrants, no masses, no organomegaly Extremities: Extremities normal, atraumatic, no cyanosis or edema Neuro: A very minimal feeling of disequilibrium. Able to stay and does not have vertigo with positional changes currently. Moves all extremities grossly well  Lab Results: Results for orders placed during the hospital encounter of 08/06/13 (from the past 48 hour(s))  ETHANOL     Status: None   Collection Time    08/07/13 12:10 AM      Result Value Range   Alcohol, Ethyl (B) <11  0 - 11 mg/dL   Comment:            LOWEST DETECTABLE LIMIT FOR     SERUM ALCOHOL IS 11 mg/dL     FOR MEDICAL PURPOSES ONLY  PROTIME-INR     Status: None   Collection Time    08/07/13 12:10 AM      Result Value Range   Prothrombin Time 14.2  11.6 - 15.2 seconds   INR 1.12  0.00 - 1.49  APTT     Status: None    Collection Time    08/07/13 12:10 AM      Result Value Range   aPTT 25  24 - 37 seconds  CBC     Status: Abnormal   Collection Time    08/07/13 12:10 AM      Result Value Range   WBC 5.5  4.0 - 10.5 K/uL   RBC 4.06 (*) 4.22 - 5.81 MIL/uL   Hemoglobin 11.3 (*) 13.0 - 17.0 g/dL   HCT 16.1 (*) 09.6 - 04.5 %   MCV 84.2  78.0 - 100.0 fL   MCH 27.8  26.0 - 34.0 pg   MCHC 33.0  30.0 - 36.0 g/dL   RDW 40.9  81.1 - 91.4 %   Platelets 156  150 - 400 K/uL  DIFFERENTIAL     Status: None   Collection Time    08/07/13 12:10 AM      Result Value Range   Neutrophils Relative % 59  43 - 77 %   Neutro Abs 3.2  1.7 - 7.7 K/uL   Lymphocytes Relative 29  12 - 46 %   Lymphs Abs 1.6  0.7 - 4.0 K/uL   Monocytes Relative 9  3 - 12 %   Monocytes Absolute 0.5  0.1 - 1.0 K/uL   Eosinophils Relative 3  0 - 5 %   Eosinophils Absolute 0.2  0.0 - 0.7 K/uL   Basophils Relative 0  0 - 1 %   Basophils Absolute 0.0  0.0 - 0.1 K/uL  COMPREHENSIVE METABOLIC PANEL     Status: Abnormal   Collection Time    08/07/13 12:10 AM      Result Value Range   Sodium 135  135 - 145 mEq/L   Potassium 3.9  3.5 - 5.1 mEq/L   Chloride 99  96 - 112 mEq/L   CO2 23  19 - 32 mEq/L   Glucose, Bld 355 (*) 70 - 99 mg/dL   BUN 22  6 - 23 mg/dL   Creatinine, Ser 1.61  0.50 - 1.35 mg/dL   Calcium 9.0  8.4 - 09.6 mg/dL   Total Protein 7.5  6.0 - 8.3 g/dL   Albumin 3.6  3.5 - 5.2 g/dL   AST 45 (*) 0 - 37 U/L   Comment: HEMOLYSIS AT THIS LEVEL MAY AFFECT RESULT   ALT 45  0 - 53 U/L   Alkaline Phosphatase 56  39 - 117 U/L   Total Bilirubin 0.3  0.3 - 1.2 mg/dL   GFR calc non Af Amer 64 (*) >90 mL/min   GFR calc Af Amer 74 (*) >90 mL/min   Comment: (NOTE)     The eGFR has been calculated using the CKD EPI equation.     This calculation has not been validated in all clinical situations.     eGFR's persistently <90 mL/min signify possible Chronic Kidney     Disease.  TROPONIN I     Status: None   Collection Time    08/07/13  12:10 AM      Result Value Range   Troponin I <0.30  <0.30 ng/mL   Comment:            Due to the release kinetics of cTnI,     a negative result within the first hours     of the onset of symptoms does not rule out     myocardial infarction with certainty.     If myocardial infarction is still suspected,     repeat the test at appropriate intervals.  GLUCOSE, CAPILLARY     Status: Abnormal   Collection Time    08/07/13 12:23 AM      Result Value Range   Glucose-Capillary 329 (*) 70 - 99 mg/dL  URINE RAPID DRUG SCREEN (HOSP PERFORMED)     Status: None   Collection Time    08/07/13  2:01 AM      Result Value Range   Opiates NONE DETECTED  NONE DETECTED   Cocaine NONE DETECTED  NONE DETECTED   Benzodiazepines NONE DETECTED  NONE DETECTED   Amphetamines NONE DETECTED  NONE DETECTED   Tetrahydrocannabinol NONE DETECTED  NONE DETECTED   Barbiturates NONE DETECTED  NONE DETECTED   Comment:            DRUG SCREEN FOR MEDICAL PURPOSES     ONLY.  IF CONFIRMATION IS NEEDED     FOR ANY PURPOSE, NOTIFY LAB     WITHIN 5 DAYS.                LOWEST DETECTABLE LIMITS     FOR URINE DRUG SCREEN  Drug Class       Cutoff (ng/mL)     Amphetamine      1000     Barbiturate      200     Benzodiazepine   200     Tricyclics       300     Opiates          300     Cocaine          300     THC              50  URINALYSIS, ROUTINE W REFLEX MICROSCOPIC     Status: Abnormal   Collection Time    08/07/13  2:01 AM      Result Value Range   Color, Urine YELLOW  YELLOW   APPearance CLEAR  CLEAR   Specific Gravity, Urine 1.026  1.005 - 1.030   pH 5.5  5.0 - 8.0   Glucose, UA >1000 (*) NEGATIVE mg/dL   Hgb urine dipstick SMALL (*) NEGATIVE   Bilirubin Urine NEGATIVE  NEGATIVE   Ketones, ur NEGATIVE  NEGATIVE mg/dL   Protein, ur NEGATIVE  NEGATIVE mg/dL   Urobilinogen, UA 0.2  0.0 - 1.0 mg/dL   Nitrite NEGATIVE  NEGATIVE   Leukocytes, UA NEGATIVE  NEGATIVE  URINE MICROSCOPIC-ADD ON     Status:  None   Collection Time    08/07/13  2:01 AM      Result Value Range   Squamous Epithelial / LPF RARE  RARE   WBC, UA 0-2  <3 WBC/hpf   RBC / HPF 7-10  <3 RBC/hpf   Bacteria, UA RARE  RARE  VITAMIN B12     Status: None   Collection Time    08/07/13  6:10 AM      Result Value Range   Vitamin B-12 429  211 - 911 pg/mL   Comment: Performed at [facility]  FOLATE     Status: None   Collection Time    08/07/13  6:10 AM      Result Value Range   Folate 13.3     Comment: (NOTE)     Reference Ranges            Deficient:       0.4 - 3.3 ng/mL            Indeterminate:   3.4 - 5.4 ng/mL            Normal:              > 5.4 ng/mL     Performed at [facility]  IRON AND TIBC     Status: Abnormal   Collection Time    08/07/13  6:10 AM      Result Value Range   Iron 48  42 - 135 ug/dL   TIBC 161  096 - 045 ug/dL   Saturation Ratios 12 (*) 20 - 55 %   UIBC 338  125 - 400 ug/dL   Comment: Performed at [facility]  FERRITIN     Status: Abnormal   Collection Time    08/07/13  6:10 AM      Result Value Range   Ferritin 15 (*) 22 - 322 ng/mL   Comment: Performed at [facility]  RETICULOCYTES     Status: Abnormal   Collection Time    08/07/13  6:10 AM      Result Value Range  Retic Ct Pct 2.5  0.4 - 3.1 %   RBC. 4.20 (*) 4.22 - 5.81 MIL/uL   Retic Count, Manual 105.0  19.0 - 186.0 K/uL  HEMOGLOBIN A1C     Status: Abnormal   Collection Time    08/07/13  6:10 AM      Result Value Range   Hemoglobin A1C 10.1 (*) <5.7 %   Comment: (NOTE)                                                                               According to the ADA Clinical Practice Recommendations for 2011, when     HbA1c is used as a screening test:      >=6.5%   Diagnostic of Diabetes Mellitus               (if abnormal result is confirmed)     5.7-6.4%   Increased risk of developing Diabetes Mellitus     References:Diagnosis and Classification of Diabetes Mellitus,Diabetes      Care,2011,34(Suppl 1):S62-S69 and Standards of Medical Care in             Diabetes - 2011,Diabetes Care,2011,34 (Suppl 1):S11-S61.   Mean Plasma Glucose 243 (*) <117 mg/dL   Comment: Performed at [facility]  LIPID PANEL     Status: Abnormal   Collection Time    08/07/13  6:10 AM      Result Value Range   Cholesterol 200  0 - 200 mg/dL   Triglycerides 578 (*) <150 mg/dL   HDL 30 (*) >46 mg/dL   Total CHOL/HDL Ratio 6.7     VLDL 45 (*) 0 - 40 mg/dL   LDL Cholesterol 962 (*) 0 - 99 mg/dL   Comment:            Total Cholesterol/HDL:CHD Risk     Coronary Heart Disease Risk Table                         Men   Women      1/2 Average Risk   3.4   3.3      Average Risk       5.0   4.4      2 X Average Risk   9.6   7.1      3 X Average Risk  23.4   11.0                Use the calculated Patient Ratio     above and the CHD Risk Table     to determine the patient's CHD Risk.                ATP III CLASSIFICATION (LDL):      <100     mg/dL   Optimal      952-841  mg/dL   Near or Above                        Optimal      130-159  mg/dL   Borderline      324-401  mg/dL   High      >027  mg/dL   Very High  GLUCOSE, CAPILLARY     Status: Abnormal   Collection Time    08/07/13  7:56 AM      Result Value Range   Glucose-Capillary 297 (*) 70 - 99 mg/dL   Comment 1 Notify RN    GLUCOSE, CAPILLARY     Status: Abnormal   Collection Time    08/07/13 12:06 PM      Result Value Range   Glucose-Capillary 280 (*) 70 - 99 mg/dL   Comment 1 Notify RN    GLUCOSE, CAPILLARY     Status: Abnormal   Collection Time    08/07/13  4:42 PM      Result Value Range   Glucose-Capillary 263 (*) 70 - 99 mg/dL   Comment 1 Notify RN    GLUCOSE, CAPILLARY     Status: Abnormal   Collection Time    08/07/13  9:00 PM      Result Value Range   Glucose-Capillary 309 (*) 70 - 99 mg/dL  GLUCOSE, CAPILLARY     Status: Abnormal   Collection Time    08/08/13  7:51 AM      Result Value Range    Glucose-Capillary 230 (*) 70 - 99 mg/dL   Comment 1 Documented in Chart     Comment 2 Notify RN      Studies/Results: Ct Head Wo Contrast  08/07/2013   CLINICAL DATA:  Dizziness, vomiting  EXAM: CT HEAD WITHOUT CONTRAST  TECHNIQUE: Contiguous axial images were obtained from the base of the skull through the vertex without contrast.  COMPARISON:  02/28/2012  FINDINGS: Mild brain atrophy and chronic white matter microvascular ischemic changes diffusely. Mild associated ventricular enlargement. No acute intracranial hemorrhage, definite infarction, mass lesion, midline shift, herniation, or extra-axial fluid collection. No focal edema or mass effect. Cisterns are patent. Cerebellar atrophy as well. Atherosclerosis of the intracranial vessels. Mastoids and sinuses clear.  IMPRESSION: Stable atrophy and chronic microvascular ischemic disease.  No acute finding by noncontrast CT   Electronically Signed   By: Ruel Favors M.D.   On: 08/07/2013 01:16   Mri Brain Without Contrast  08/07/2013   CLINICAL DATA:  TIA.  Dizziness.  EXAM: MRI HEAD WITHOUT CONTRAST  MRA HEAD WITHOUT CONTRAST  TECHNIQUE: Multiplanar, multiecho pulse sequences of the brain and surrounding structures were obtained without intravenous contrast. Angiographic images of the head were obtained using MRA technique without contrast.  COMPARISON:  Head CT 08/07/2013.  MRI 03/07/2012.  FINDINGS: MRI HEAD FINDINGS  There is a 2 cm acute infarction within the inferior cerebellum on the left. No other acute infarction. The area shows mild swelling but there is no hemorrhage or mass effect.  There are chronic small-vessel changes affecting the pons. The cerebral hemispheres show generalized atrophy with chronic small vessel disease affecting the deep and subcortical white matter. No mass lesion, hydrocephalus or extra-axial collection. No pituitary mass. No inflammatory sinus disease.  MRA HEAD FINDINGS  Both internal carotid arteries are widely  patent into the brain. No siphon stenosis. The anterior and middle cerebral vessels are patent without proximal stenosis, aneurysm or vascular malformation.  The left vertebral artery is a large vessel widely patent to the basilar. No flow is seen in the left posterior inferior cerebellar artery. The right vertebral artery is a small-vessel that terminates in the posterior inferior cerebellar artery on that side. There is atherosclerotic irregularity and narrowing of the proximal basilar artery, the stenosis is no greater than 30-50%.  The superior cerebellar and posterior cerebral arteries are patent bilaterally, though there is atherosclerotic irregularity. The posterior cerebral arteries receive most a there supply from the anterior circulation.  IMPRESSION: 2 cm acute infarction at the inferior cerebellum on the left. Mild swelling but no mass effect or hemorrhage.  No flow demonstrated in the left posterior inferior cerebellar artery at MR angiography.   Electronically Signed   By: Paulina Fusi M.D.   On: 08/07/2013 18:27   Mr Maxine Glenn Head/brain Wo Cm  08/07/2013   CLINICAL DATA:  TIA.  Dizziness.  EXAM: MRI HEAD WITHOUT CONTRAST  MRA HEAD WITHOUT CONTRAST  TECHNIQUE: Multiplanar, multiecho pulse sequences of the brain and surrounding structures were obtained without intravenous contrast. Angiographic images of the head were obtained using MRA technique without contrast.  COMPARISON:  Head CT 08/07/2013.  MRI 03/07/2012.  FINDINGS: MRI HEAD FINDINGS  There is a 2 cm acute infarction within the inferior cerebellum on the left. No other acute infarction. The area shows mild swelling but there is no hemorrhage or mass effect.  There are chronic small-vessel changes affecting the pons. The cerebral hemispheres show generalized atrophy with chronic small vessel disease affecting the deep and subcortical white matter. No mass lesion, hydrocephalus or extra-axial collection. No pituitary mass. No inflammatory sinus  disease.  MRA HEAD FINDINGS  Both internal carotid arteries are widely patent into the brain. No siphon stenosis. The anterior and middle cerebral vessels are patent without proximal stenosis, aneurysm or vascular malformation.  The left vertebral artery is a large vessel widely patent to the basilar. No flow is seen in the left posterior inferior cerebellar artery. The right vertebral artery is a small-vessel that terminates in the posterior inferior cerebellar artery on that side. There is atherosclerotic irregularity and narrowing of the proximal basilar artery, the stenosis is no greater than 30-50%. The superior cerebellar and posterior cerebral arteries are patent bilaterally, though there is atherosclerotic irregularity. The posterior cerebral arteries receive most a there supply from the anterior circulation.  IMPRESSION: 2 cm acute infarction at the inferior cerebellum on the left. Mild swelling but no mass effect or hemorrhage.  No flow demonstrated in the left posterior inferior cerebellar artery at MR angiography.   Electronically Signed   By: Paulina Fusi M.D.   On: 08/07/2013 18:27   Medications: Scheduled Meds: . apixaban  5 mg Oral BID  . diltiazem  360 mg Oral Daily  . febuxostat  80 mg Oral Daily  . glyBURIDE  5 mg Oral BID WC  . insulin aspart  0-5 Units Subcutaneous QHS  . insulin aspart  0-9 Units Subcutaneous TID WC  . loratadine  10 mg Oral Daily  . pantoprazole  40 mg Oral Daily  . tamsulosin  0.4 mg Oral QPC breakfast   Continuous Infusions:  PRN Meds:.HYDROmorphone (DILAUDID) injection  Assessment/Plan: Principal Problem:   Dizziness - left cerebellar CVA confirmed by MRI while on Eliquis. May need TEE - neurology consulted and Active Problems:   Diabetes mellitus type 2, uncontrolled, with complications   CKD (chronic kidney disease), stage II   TIA (transient ischemic attack)   Atrial fibrillation   Hypertension   Anemia    LOS: 2 days   Pearla Dubonnet,  MD 08/08/2013, 10:24 AM

## 2013-08-11 DIAGNOSIS — I639 Cerebral infarction, unspecified: Secondary | ICD-10-CM

## 2013-08-11 HISTORY — DX: Cerebral infarction, unspecified: I63.9

## 2013-08-26 ENCOUNTER — Telehealth: Payer: Self-pay | Admitting: Internal Medicine

## 2013-08-26 NOTE — Telephone Encounter (Signed)
New problem    Pt's wife called for pt to come in sooner.  Pt is experiencing SOB , fatigue, HR 80-120 off and on. Call pt's wife cell [phone] Please.

## 2013-08-26 NOTE — Telephone Encounter (Signed)
Pt wife concerned because pt has had 2 episodes of SOB with exertion. Pt had a stroke 08/07/13. Slight edema in legs, sodium rich foods to avoid were reviewed/ no salt in diet x 2 weeks. Pulse 70-120 bpm/afib hx,  no BP done, pt is not at home-at vacation home. Pt was made an app 08/31/13 with Ermalinda Barrios PA. Pt has an app with Dr Lovena Le 09/20/13 Pt was asked to check daily weight over the weekend/ told to avoid sodium. Pt was advised to call with questions or concerns. Pt accepting of plan.

## 2013-08-31 ENCOUNTER — Ambulatory Visit (INDEPENDENT_AMBULATORY_CARE_PROVIDER_SITE_OTHER): Payer: BC Managed Care – PPO | Admitting: Physician Assistant

## 2013-08-31 ENCOUNTER — Encounter: Payer: Self-pay | Admitting: Physician Assistant

## 2013-08-31 ENCOUNTER — Ambulatory Visit: Payer: BC Managed Care – PPO | Admitting: Physician Assistant

## 2013-08-31 VITALS — BP 130/88 | HR 68 | Ht 69.0 in | Wt 294.0 lb

## 2013-08-31 DIAGNOSIS — R0609 Other forms of dyspnea: Secondary | ICD-10-CM | POA: Diagnosis not present

## 2013-08-31 DIAGNOSIS — I6350 Cerebral infarction due to unspecified occlusion or stenosis of unspecified cerebral artery: Secondary | ICD-10-CM

## 2013-08-31 DIAGNOSIS — R0989 Other specified symptoms and signs involving the circulatory and respiratory systems: Secondary | ICD-10-CM

## 2013-08-31 DIAGNOSIS — I4892 Unspecified atrial flutter: Secondary | ICD-10-CM | POA: Diagnosis not present

## 2013-08-31 DIAGNOSIS — I4891 Unspecified atrial fibrillation: Secondary | ICD-10-CM

## 2013-08-31 DIAGNOSIS — R0600 Dyspnea, unspecified: Secondary | ICD-10-CM

## 2013-08-31 DIAGNOSIS — I484 Atypical atrial flutter: Secondary | ICD-10-CM

## 2013-08-31 DIAGNOSIS — I10 Essential (primary) hypertension: Secondary | ICD-10-CM

## 2013-08-31 DIAGNOSIS — I639 Cerebral infarction, unspecified: Secondary | ICD-10-CM

## 2013-08-31 LAB — BASIC METABOLIC PANEL
BUN: 20 mg/dL (ref 6–23)
CHLORIDE: 106 meq/L (ref 96–112)
CO2: 28 meq/L (ref 19–32)
CREATININE: 1.3 mg/dL (ref 0.4–1.5)
Calcium: 9.4 mg/dL (ref 8.4–10.5)
GFR: 57.74 mL/min — ABNORMAL LOW (ref >60.00)
Glucose, Bld: 133 mg/dL — ABNORMAL HIGH (ref 70–99)
POTASSIUM: 4.3 meq/L (ref 3.5–5.1)
Sodium: 141 mEq/L (ref 135–145)

## 2013-08-31 LAB — BRAIN NATRIURETIC PEPTIDE: PRO B NATRI PEPTIDE: 312 pg/mL — AB (ref 0.0–100.0)

## 2013-08-31 MED ORDER — POTASSIUM CHLORIDE CRYS ER 20 MEQ PO TBCR: 20.0000 meq | EXTENDED_RELEASE_TABLET | Freq: Every day | ORAL | Status: DC

## 2013-08-31 MED ORDER — FUROSEMIDE 40 MG PO TABS: 40.0000 mg | ORAL_TABLET | Freq: Every day | ORAL | Status: DC

## 2013-08-31 NOTE — Assessment & Plan Note (Signed)
Controlled.  

## 2013-08-31 NOTE — Assessment & Plan Note (Addendum)
Patient is one-week history of significant dyspnea on exertion and orthopnea. He also has lower extremity edema. We will check a pro BNP and be met today. We'll add Lasix 40 mg once daily and potassium 20 mEq once daily. He has an appointment to see Dr. Lovena Le back February 10. If he does not improve in the interim he is to call and schedule an appointment sooner. 2-D echo in November 2014 showed normal LV function and indeterminate diastolic function

## 2013-08-31 NOTE — Patient Instructions (Signed)
Your physician has recommended you make the following change in your medication:   1. Start Lasix 40 mg once daily 2. Start Potassium 20 meq once daily  Your physician recommends that you have labs today: BMET, BNP

## 2013-08-31 NOTE — Progress Notes (Signed)
HPI: This is a 68 year old male patient of Dr. Crissie Sickles who has history of morbid obesity, hypertension, atrial fibrillation on Eliquis and diltiazem. He was admitted to the hospital in December or with dizziness felt secondary to a left cerebellar thrombotic CVA likely secondary to small vessel disease. Her neurologist went to keep him on  Eliquis at this time.  The patient comes in today complaining of one-week history of worsening shortness of breath. He says he can't walk across a room without getting out of breath and he is now complaining of orthopnea. He has had shortness of breath in the past and a 2-D echo was performed in November 2014 that showed normal LV function with indeterminate diastolic function because of his atrial fibrillation. He says his heart rates been around 101 which is high for him. He denies any chest pain or significant palpitations. He has some edema. He's been watching his diet very closely   Allergies -- Iohexol    --   Desc: HIVES S/P 13HR.PREMEDS,PT WEIGHS            367LBS,?LOW DOSAGE PREMEDS  -- Ivp Dye [Iodinated Diagnostic Agents]    --  welps  Current Outpatient Prescriptions on File Prior to Visit: apixaban (ELIQUIS) 5 MG TABS tablet, Take 1 tablet (5 mg total) by mouth 2 (two) times daily., Disp: 60 tablet, Rfl: 1 diltiazem (CARDIZEM CD) 360 MG 24 hr capsule, Take 1 capsule (360 mg total) by mouth daily., Disp: 30 capsule, Rfl: 0 febuxostat (ULORIC) 40 MG tablet, Take 2 tablets (80 mg total) by mouth daily., Disp: 60 tablet, Rfl: 0 glyBURIDE (DIABETA) 5 MG tablet, Take 1 tablet (5 mg total) by mouth 2 (two) times daily with a meal., Disp: 60 tablet, Rfl: 0 indomethacin (INDOCIN) 50 MG capsule, Take 50-150 mg by mouth daily as needed (for gout flare ups     start with 3 capsules, then taper down daily to 1 capsule). , Disp: , Rfl:  insulin aspart (NOVOLOG) 100 UNIT/ML injection, Inject 0-9 Units into the skin 3 (three) times daily with meals., Disp: 1  vial, Rfl: 12 insulin aspart (NOVOLOG) 100 UNIT/ML injection, Inject 0-5 Units into the skin at bedtime., Disp: 1 vial, Rfl: 12 metFORMIN (GLUCOPHAGE) 1000 MG tablet, Take 1,000 mg by mouth 2 (two) times daily with a meal., Disp: , Rfl:  pantoprazole (PROTONIX) 40 MG tablet, Take 1 tablet (40 mg total) by mouth daily., Disp: 30 tablet, Rfl: 1 tamsulosin (FLOMAX) 0.4 MG CAPS capsule, Take 1 capsule (0.4 mg total) by mouth daily after breakfast., Disp: 30 capsule, Rfl: 0  No current facility-administered medications on file prior to visit.   Past Medical History:   Diabetes mellitus without complication                         Comment:improved after diet/exercise   Hypertension                                                   Comment:improved after diet/exercise   Kidney stones                                                GERD (gastroesophageal  reflux disease)                       H/O hiatal hernia                                            Arthritis                                                      Comment:hips   OSA (obstructive sleep apnea)                                  Comment:wears cpap   Anal fissure                                                   Comment:h/o - no recent complications   Atrial fibrillation                                         Past Surgical History:   CHOLECYSTECTOMY                                               KNEE ARTHROSCOPY                                              carpel tunnel                                                 LIGAMENT REPAIR                                               KIDNEY STONE SURGERY                                         Review of patient's family history indicates:   COPD                           Father                   Heart attack                   Sister  Comment: died age 68 of MI   Social History   Marital Status: Married             Spouse Name:                      Years of  Education:                 Number of children:             Occupational History   None on file  Social History Main Topics   Smoking Status: Never Smoker                     Smokeless Status: Never Used                       Alcohol Use: Yes               Comment: rare - 1-2x /yr   Drug Use: No             Sexual Activity: Not on file        Other Topics            Concern   None on file  Social History Narrative   None on file    ROS: See history of present illness otherwise negative   PHYSICAL EXAM: Obesity, in no acute distress. Neck: No JVD, HJR, Bruit, or thyroid enlargement  Lungs: Decreased breath sounds with a few crackles at the bases otherwise No tachypnea, clear without wheezing, rales, or rhonchi  Cardiovascular: Irregular irregular, PMI not displaced,  2/6 systolic murmur at the left sternal border, no gallops, bruit, thrill, or heave.  Abdomen: BS normal. Soft without organomegaly, masses, lesions or tenderness.  Extremities: without cyanosis, clubbing or edema. Good distal pulses bilateral  SKin: Warm, no lesions or rashes   Musculoskeletal: No deformities  Neuro: no focal signs  BP 130/88  Pulse 68  Ht 5\' 9"  (1.753 m)  Wt 294 lb (133.358 kg)  BMI 43.40 kg/m2  SpO2 94%   EKG: Atrial fibrillation at 96 beats per minute old septal infarct nonspecific ST-T wave changes

## 2013-08-31 NOTE — Assessment & Plan Note (Signed)
Weight loss recommended 

## 2013-08-31 NOTE — Assessment & Plan Note (Signed)
Patient suffered a left cerebellar thrombotic CVA likely secondary to small vessel disease December 6 27th 2014. He has no residual from this. Neurologist did not recommend changing his anticoagulation.

## 2013-08-31 NOTE — Assessment & Plan Note (Signed)
Patient's atrial fibrillation has been a little faster than usual. He is on diltiazem 360 mg daily. Hopefully with giving him a diuretic and improving his breathing his heart rate will come down. He is at 96 beats per minute today. He is to call if there is no improvement in the next couple days

## 2013-09-05 ENCOUNTER — Ambulatory Visit (INDEPENDENT_AMBULATORY_CARE_PROVIDER_SITE_OTHER): Payer: BC Managed Care – PPO | Admitting: Ophthalmology

## 2013-09-05 DIAGNOSIS — H35039 Hypertensive retinopathy, unspecified eye: Secondary | ICD-10-CM

## 2013-09-05 DIAGNOSIS — E1139 Type 2 diabetes mellitus with other diabetic ophthalmic complication: Secondary | ICD-10-CM

## 2013-09-05 DIAGNOSIS — E11319 Type 2 diabetes mellitus with unspecified diabetic retinopathy without macular edema: Secondary | ICD-10-CM

## 2013-09-05 DIAGNOSIS — H3581 Retinal edema: Secondary | ICD-10-CM

## 2013-09-05 DIAGNOSIS — I10 Essential (primary) hypertension: Secondary | ICD-10-CM

## 2013-09-05 DIAGNOSIS — E1165 Type 2 diabetes mellitus with hyperglycemia: Secondary | ICD-10-CM

## 2013-09-05 DIAGNOSIS — H43819 Vitreous degeneration, unspecified eye: Secondary | ICD-10-CM

## 2013-09-07 ENCOUNTER — Telehealth: Payer: Self-pay | Admitting: Internal Medicine

## 2013-09-07 NOTE — Telephone Encounter (Signed)
LMTCB

## 2013-09-07 NOTE — Telephone Encounter (Signed)
Follow Up ° °Pt returned call. Please call  °

## 2013-09-09 ENCOUNTER — Other Ambulatory Visit: Payer: Self-pay | Admitting: *Deleted

## 2013-09-09 DIAGNOSIS — T82110A Breakdown (mechanical) of cardiac electrode, initial encounter: Secondary | ICD-10-CM

## 2013-09-09 NOTE — Telephone Encounter (Signed)
Spoke with patient and gave him results of labs

## 2013-09-20 ENCOUNTER — Ambulatory Visit (INDEPENDENT_AMBULATORY_CARE_PROVIDER_SITE_OTHER): Payer: BC Managed Care – PPO | Admitting: Internal Medicine

## 2013-09-20 ENCOUNTER — Encounter: Payer: Self-pay | Admitting: Internal Medicine

## 2013-09-20 VITALS — BP 142/77 | HR 83 | Ht 69.0 in | Wt 284.0 lb

## 2013-09-20 DIAGNOSIS — I5032 Chronic diastolic (congestive) heart failure: Secondary | ICD-10-CM | POA: Diagnosis not present

## 2013-09-20 DIAGNOSIS — I4891 Unspecified atrial fibrillation: Secondary | ICD-10-CM

## 2013-09-20 NOTE — Patient Instructions (Signed)
Your physician wants you to follow-up in: 6 months with Dr Taylor You will receive a reminder letter in the mail two months in advance. If you don't receive a letter, please call our office to schedule the follow-up appointment.  

## 2013-09-21 ENCOUNTER — Encounter: Payer: Self-pay | Admitting: Internal Medicine

## 2013-09-21 DIAGNOSIS — I5032 Chronic diastolic (congestive) heart failure: Secondary | ICD-10-CM | POA: Insufficient documentation

## 2013-09-21 NOTE — Assessment & Plan Note (Signed)
He is asymptomatic. His ventricular rate appears to be controlled. He will continue anti-coagulation and rate control with diltiazem.

## 2013-09-21 NOTE — Progress Notes (Signed)
HPI Oscar [name] returns today for followup. He is a very pleasant 68 year old man with morbid obesity and hypertension and diabetes. He  has not been hospitalized.  He notes that he is still tired and sleeps a lot. He has had increasing fatigue and weakness.The patient has no knowledge that he is in atrial fibrillation. He denies chest pain or syncope. No palpitations. He has mild dyspnea with exertion. The developed volume overload since his last office visit and had to have his diuretic dose adjusted. He lost approximately 15 lbs over two days and his dyspnea is much improved.  Allergies  Allergen Reactions  . Iohexol      Desc: HIVES S/P 13HR.PREMEDS,PT WEIGHS 367LBS,?LOW DOSAGE PREMEDS   . Ivp Dye [Iodinated Diagnostic Agents]     welps      Current Outpatient Prescriptions  Medication Sig Dispense Refill  . apixaban (ELIQUIS) 5 MG TABS tablet Take 1 tablet (5 mg total) by mouth 2 (two) times daily.  60 tablet  1  . diltiazem (CARDIZEM CD) 360 MG 24 hr capsule Take 1 capsule (360 mg total) by mouth daily.  30 capsule  0  . diphenhydrAMINE (BENADRYL) 25 MG tablet Take 25 mg by mouth every 6 (six) hours as needed.      . febuxostat (ULORIC) 40 MG tablet Take 40 mg by mouth daily.      . furosemide (LASIX) 40 MG tablet Take 1 tablet (40 mg total) by mouth daily.  90 tablet  3  . glyBURIDE (DIABETA) 5 MG tablet Take 1 tablet (5 mg total) by mouth 2 (two) times daily with a meal.  60 tablet  0  . indomethacin (INDOCIN) 50 MG capsule Take 50-150 mg by mouth daily as needed (for gout flare ups     start with 3 capsules, then taper down daily to 1 capsule).       . insulin aspart (NOVOLOG) 100 UNIT/ML injection Inject 0-9 Units into the skin 3 (three) times daily with meals.  1 vial  12  . insulin aspart (NOVOLOG) 100 UNIT/ML injection Inject 0-5 Units into the skin at bedtime.  1 vial  12  . metFORMIN (GLUCOPHAGE) 1000 MG tablet Take 1,000 mg by mouth 2 (two) times daily with a meal.      .  pantoprazole (PROTONIX) 40 MG tablet Take 1 tablet (40 mg total) by mouth daily.  30 tablet  1  . potassium chloride SA (KLOR-CON M20) 20 MEQ tablet Take 1 tablet (20 mEq total) by mouth daily.  90 tablet  3  . simvastatin (ZOCOR) 20 MG tablet Take 20 mg by mouth daily. 1 TABLET MONDAYS/WEDNESDAYS/FRIDAY      . tamsulosin (FLOMAX) 0.4 MG CAPS capsule Take 1 capsule (0.4 mg total) by mouth daily after breakfast.  30 capsule  0   No current facility-administered medications for this visit.     Past Medical History  Diagnosis Date  . Diabetes mellitus without complication     improved after diet/exercise  . Hypertension     improved after diet/exercise  . Kidney stones   . GERD (gastroesophageal reflux disease)   . H/O hiatal hernia   . Arthritis     hips  . OSA (obstructive sleep apnea)     wears cpap  . Anal fissure     h/o - no recent complications  . Atrial fibrillation     ROS:   All systems reviewed and negative except as noted in the HPI.   Past  Surgical History  Procedure Laterality Date  . Cholecystectomy    . Knee arthroscopy    . Carpel tunnel    . Ligament repair    . Kidney stone surgery       Family History  Problem Relation Age of Onset  . COPD Father   . Heart attack Sister     died age 41 of MI     History   Social History  . Marital Status: Married    Spouse Name: N/A    Number of Children: N/A  . Years of Education: N/A   Occupational History  . Not on file.   Social History Main Topics  . Smoking status: Never Smoker   . Smokeless tobacco: Never Used  . Alcohol Use: Yes     Comment: rare - 1-2x /yr  . Drug Use: No  . Sexual Activity: Not on file   Other Topics Concern  . Not on file   Social History Narrative  . No narrative on file     BP 142/77  Pulse 83  Ht 5\' 9"  (1.753 m)  Wt 284 lb (128.822 kg)  BMI 41.92 kg/m2  Physical Exam:  Morbidly obese appearing 68 year old man, NAD HEENT: Unremarkable Neck:  7 cm JVD,  no thyromegally Back:  No CVA tenderness Lungs:  Clear with no wheezes, rales, or rhonchi. HEART:  IRegular rate rhythm, no murmurs, no rubs, no clicks Abd:  soft, obese, positive bowel sounds, no organomegally, no rebound, no guarding Ext:  2 plus pulses,  Trace peripheral edema, no cyanosis, no clubbing Skin:  No rashes no nodules Neuro:  CN II through XII intact, motor grossly intact  EKG - atrial fibrillation   Assess/Plan:

## 2013-09-21 NOTE — Assessment & Plan Note (Signed)
I have strongly encouraged the patient to lose weight.  

## 2013-09-21 NOTE — Assessment & Plan Note (Signed)
He will continue his diuretic therapy and he is instructed to maintain a low sodium diet.

## 2013-09-28 ENCOUNTER — Other Ambulatory Visit: Payer: Self-pay | Admitting: Gastroenterology

## 2013-09-28 DIAGNOSIS — R12 Heartburn: Secondary | ICD-10-CM

## 2013-10-03 ENCOUNTER — Ambulatory Visit
Admission: RE | Admit: 2013-10-03 | Discharge: 2013-10-03 | Disposition: A | Discharge status: Home / Self Care | Payer: BC Managed Care – PPO | Source: Ambulatory Visit | Attending: Gastroenterology | Admitting: Gastroenterology

## 2013-10-03 DIAGNOSIS — R12 Heartburn: Secondary | ICD-10-CM

## 2013-10-07 ENCOUNTER — Other Ambulatory Visit: Payer: BC Managed Care – PPO

## 2013-10-12 ENCOUNTER — Other Ambulatory Visit: Payer: Self-pay | Admitting: Dermatology

## 2013-10-12 DIAGNOSIS — C44319 Basal cell carcinoma of skin of other parts of face: Secondary | ICD-10-CM | POA: Diagnosis not present

## 2013-10-12 DIAGNOSIS — C44621 Squamous cell carcinoma of skin of unspecified upper limb, including shoulder: Secondary | ICD-10-CM | POA: Diagnosis not present

## 2013-10-12 DIAGNOSIS — Z85828 Personal history of other malignant neoplasm of skin: Secondary | ICD-10-CM | POA: Diagnosis not present

## 2013-10-12 DIAGNOSIS — L570 Actinic keratosis: Secondary | ICD-10-CM | POA: Diagnosis not present

## 2013-11-02 ENCOUNTER — Encounter: Payer: Self-pay | Admitting: Podiatry

## 2013-11-02 ENCOUNTER — Ambulatory Visit (INDEPENDENT_AMBULATORY_CARE_PROVIDER_SITE_OTHER): Payer: BC Managed Care – PPO | Admitting: Podiatry

## 2013-11-02 VITALS — BP 142/86 | HR 79 | Resp 16

## 2013-11-02 DIAGNOSIS — L600 Ingrowing nail: Secondary | ICD-10-CM

## 2013-11-02 NOTE — Patient Instructions (Signed)

## 2013-11-02 NOTE — Progress Notes (Signed)
   Subjective:    Patient ID: Oscar [name], male    DOB: 1946/04/10, 68 y.o.   MRN: [id number]  HPI Comments: "I have a bad toenail"  Patient c/o thick, discolored nail 4th toe left for several months. It has been sore along the lateral border. He is unable to trim. Did try using sea salt on it, but no help.     Review of Systems  HENT: Positive for hearing loss.   Musculoskeletal: Positive for arthralgias.  Skin:       Change in nail   Allergic/Immunologic: Positive for environmental allergies.  Hematological:       Slow to heal  All other systems reviewed and are negative.       Objective:   Physical Exam        Assessment & Plan:

## 2013-11-03 NOTE — Progress Notes (Signed)
Subjective:     Patient ID: Oscar [name], male   DOB: 04-21-1946, 68 y.o.   MRN: [id number]  HPI patient presents stating this fourth nail on my left foot is thick and painful and I cannot cut it and I would like to have it removed. States his sugar has been under reasonable control and his last A1c had been around 6   Review of Systems  All other systems reviewed and are negative.       Objective:   Physical Exam  Nursing note and vitals reviewed. Constitutional: He is oriented to person, place, and time.  Cardiovascular: Intact distal pulses.   Musculoskeletal: Normal range of motion.  Neurological: He is oriented to person, place, and time.  Skin: Skin is warm.   neurovascular status intact with muscle strength adequate and normal range of motion subtalar midtarsal joint. Fill time to the digits is normal and digits are warm and well perfused and arch height is within normal limits. Patient is found to have a thickened dystrophic fourth nail left it's painful when pressed dorsal     Assessment:     Chronic damage fourth nail left painful when pressed    Plan:     H&P reviewed and treatment options discussed patient would like to have this removed permanently and I explained risk of permanent procedure and healing. Patient wants procedure done and today I infiltrated 60 mg Xylocaine Marcaine mixture remove the fourth nail exposed matrix and applied chemical phenol 4 applications 30 seconds followed by alcohol lavaged and sterile dressing. Instructed on soaks and reappoint

## 2013-11-16 DIAGNOSIS — Z85828 Personal history of other malignant neoplasm of skin: Secondary | ICD-10-CM | POA: Diagnosis not present

## 2013-11-16 DIAGNOSIS — C44319 Basal cell carcinoma of skin of other parts of face: Secondary | ICD-10-CM | POA: Diagnosis not present

## 2013-11-23 DIAGNOSIS — Z85828 Personal history of other malignant neoplasm of skin: Secondary | ICD-10-CM | POA: Diagnosis not present

## 2013-11-23 DIAGNOSIS — C44319 Basal cell carcinoma of skin of other parts of face: Secondary | ICD-10-CM | POA: Diagnosis not present

## 2014-01-05 ENCOUNTER — Ambulatory Visit (INDEPENDENT_AMBULATORY_CARE_PROVIDER_SITE_OTHER): Payer: BC Managed Care – PPO | Admitting: Ophthalmology

## 2014-01-09 ENCOUNTER — Ambulatory Visit (INDEPENDENT_AMBULATORY_CARE_PROVIDER_SITE_OTHER): Payer: BC Managed Care – PPO | Admitting: Ophthalmology

## 2014-01-09 DIAGNOSIS — H35039 Hypertensive retinopathy, unspecified eye: Secondary | ICD-10-CM

## 2014-01-09 DIAGNOSIS — I10 Essential (primary) hypertension: Secondary | ICD-10-CM

## 2014-01-09 DIAGNOSIS — E1165 Type 2 diabetes mellitus with hyperglycemia: Secondary | ICD-10-CM

## 2014-01-09 DIAGNOSIS — E1139 Type 2 diabetes mellitus with other diabetic ophthalmic complication: Secondary | ICD-10-CM

## 2014-01-09 DIAGNOSIS — H43819 Vitreous degeneration, unspecified eye: Secondary | ICD-10-CM

## 2014-01-09 DIAGNOSIS — E11319 Type 2 diabetes mellitus with unspecified diabetic retinopathy without macular edema: Secondary | ICD-10-CM

## 2014-02-18 ENCOUNTER — Other Ambulatory Visit: Payer: Self-pay | Admitting: Physician Assistant

## 2014-02-18 ENCOUNTER — Telehealth: Payer: Self-pay | Admitting: Physician Assistant

## 2014-02-18 MED ORDER — DILTIAZEM HCL ER COATED BEADS 360 MG PO CP24: 360.0000 mg | ORAL_CAPSULE | Freq: Every day | ORAL | Status: DC

## 2014-02-18 NOTE — Telephone Encounter (Signed)
Ms. Kennyth Arnold called because her husband had run out of his Cardizem CD 360 mg. She stated they had called for refills at their mail-order pharmacy, but when the medications came in the mail, the Cardizem was not with them. They put in for refills several weeks ago, but did not receive the package until today. She was concerned because his blood pressure and heart rate are running higher than usual. He has not had Cardizem in 2 days.  I advised her that I was happy to call in a one-month prescription of the Cardizem to the local [facility] pharmacy they use.

## 2014-02-20 ENCOUNTER — Other Ambulatory Visit: Payer: Self-pay | Admitting: *Deleted

## 2014-02-20 MED ORDER — DILTIAZEM HCL ER COATED BEADS 360 MG PO CP24
360.0000 mg | ORAL_CAPSULE | Freq: Every day | ORAL | Status: AC
Start: 2014-02-20 — End: ?

## 2014-03-28 ENCOUNTER — Encounter: Payer: Self-pay | Admitting: Internal Medicine

## 2014-03-28 ENCOUNTER — Ambulatory Visit (INDEPENDENT_AMBULATORY_CARE_PROVIDER_SITE_OTHER): Payer: BC Managed Care – PPO | Admitting: Internal Medicine

## 2014-03-28 VITALS — BP 148/79 | HR 95 | Ht 69.0 in | Wt 291.0 lb

## 2014-03-28 DIAGNOSIS — I4891 Unspecified atrial fibrillation: Secondary | ICD-10-CM | POA: Diagnosis not present

## 2014-03-28 DIAGNOSIS — I5032 Chronic diastolic (congestive) heart failure: Secondary | ICD-10-CM

## 2014-03-28 NOTE — Progress Notes (Signed)
HPI Oscar [name] returns today for followup. He is a very pleasant 68 year old man with morbid obesity and hypertension and diabetes.  He has had increasing fatigue and weakness.The patient has no knowledge that he is in atrial fibrillation. He denies chest pain or syncope. No palpitations. He has mild dyspnea with exertion. The developed volume overload since his last office visit and had to have his diuretic dose adjusted. He has started to taste food again and has gained weight.  Allergies  Allergen Reactions  . Iohexol      Desc: HIVES S/P 13HR.PREMEDS,PT WEIGHS 367LBS,?LOW DOSAGE PREMEDS   . Ivp Dye [Iodinated Diagnostic Agents]     welps      Current Outpatient Prescriptions  Medication Sig Dispense Refill  . apixaban (ELIQUIS) 5 MG TABS tablet Take 1 tablet (5 mg total) by mouth 2 (two) times daily.  60 tablet  1  . diltiazem (CARDIZEM CD) 360 MG 24 hr capsule Take 1 capsule (360 mg total) by mouth daily.  90 capsule  0  . diphenhydrAMINE (BENADRYL) 25 MG tablet Take 25 mg by mouth every 6 (six) hours as needed.      . febuxostat (ULORIC) 40 MG tablet Take 40 mg by mouth daily.      . furosemide (LASIX) 40 MG tablet Take 1 tablet (40 mg total) by mouth daily.  90 tablet  3  . glyBURIDE (DIABETA) 5 MG tablet Take 1 tablet (5 mg total) by mouth 2 (two) times daily with a meal.  60 tablet  0  . indomethacin (INDOCIN) 50 MG capsule Take 50-150 mg by mouth daily as needed (for gout flare ups     start with 3 capsules, then taper down daily to 1 capsule).       . insulin aspart (NOVOLOG) 100 UNIT/ML injection Inject 0-9 Units into the skin 3 (three) times daily with meals.  1 vial  12  . insulin aspart (NOVOLOG) 100 UNIT/ML injection Inject 0-5 Units into the skin at bedtime.  1 vial  12  . metFORMIN (GLUCOPHAGE) 1000 MG tablet Take 1,000 mg by mouth 2 (two) times daily with a meal.      . pantoprazole (PROTONIX) 40 MG tablet Take 1 tablet (40 mg total) by mouth daily.  30 tablet  1  .  potassium chloride SA (KLOR-CON M20) 20 MEQ tablet Take 1 tablet (20 mEq total) by mouth daily.  90 tablet  3  . simvastatin (ZOCOR) 20 MG tablet Take 20 mg by mouth daily. 1 TABLET MONDAYS/WEDNESDAYS/FRIDAY      . tamsulosin (FLOMAX) 0.4 MG CAPS capsule Take 1 capsule (0.4 mg total) by mouth daily after breakfast.  30 capsule  0   No current facility-administered medications for this visit.     Past Medical History  Diagnosis Date  . Diabetes mellitus without complication     improved after diet/exercise  . Hypertension     improved after diet/exercise  . Kidney stones   . GERD (gastroesophageal reflux disease)   . H/O hiatal hernia   . Arthritis     hips  . OSA (obstructive sleep apnea)     wears cpap  . Anal fissure     h/o - no recent complications  . Atrial fibrillation     ROS:   All systems reviewed and negative except as noted in the HPI.   Past Surgical History  Procedure Laterality Date  . Cholecystectomy    . Knee arthroscopy    . Carpel  tunnel    . Ligament repair    . Kidney stone surgery       Family History  Problem Relation Age of Onset  . COPD Father   . Heart attack Sister     died age 38 of MI     History   Social History  . Marital Status: Married    Spouse Name: N/A    Number of Children: N/A  . Years of Education: N/A   Occupational History  . Not on file.   Social History Main Topics  . Smoking status: Never Smoker   . Smokeless tobacco: Never Used  . Alcohol Use: Yes     Comment: rare - 1-2x /yr  . Drug Use: No  . Sexual Activity: Not on file   Other Topics Concern  . Not on file   Social History Narrative  . No narrative on file     BP 148/79  Pulse 95  Ht 5\' 9"  (1.753 m)  Wt 291 lb (131.997 kg)  BMI 42.95 kg/m2  Physical Exam:  Morbidly obese appearing 68 year old man, NAD HEENT: Unremarkable Neck:  7 cm JVD, no thyromegally Back:  No CVA tenderness Lungs:  Clear with no wheezes, rales, or  rhonchi. HEART:  IRegular rate rhythm, no murmurs, no rubs, no clicks Abd:  soft, obese, positive bowel sounds, no organomegally, no rebound, no guarding Ext:  2 plus pulses,  Trace peripheral edema, no cyanosis, no clubbing Skin:  No rashes no nodules Neuro:  CN II through XII intact, motor grossly intact  EKG - atrial fibrillation   Assess/Plan:

## 2014-03-28 NOTE — Patient Instructions (Signed)
Your physician recommends that you continue on your current medications as directed. Please refer to the Current Medication list given to you today.  Your physician wants you to follow-up in: 6 months with Dr. Taylor.  You will receive a reminder letter in the mail two months in advance. If you don't receive a letter, please call our office to schedule the follow-up appointment.  

## 2014-03-28 NOTE — Assessment & Plan Note (Signed)
I continue to discuss the importance of eating less and losing weight. He states that he will try to lose 10 lbs in the next 6 months.

## 2014-03-28 NOTE — Assessment & Plan Note (Signed)
His ventricular rate appears to be well controlled. He will continue his current meds.

## 2014-04-25 DIAGNOSIS — L821 Other seborrheic keratosis: Secondary | ICD-10-CM | POA: Diagnosis not present

## 2014-04-25 DIAGNOSIS — D1801 Hemangioma of skin and subcutaneous tissue: Secondary | ICD-10-CM | POA: Diagnosis not present

## 2014-04-25 DIAGNOSIS — Z85828 Personal history of other malignant neoplasm of skin: Secondary | ICD-10-CM | POA: Diagnosis not present

## 2014-04-25 DIAGNOSIS — L570 Actinic keratosis: Secondary | ICD-10-CM | POA: Diagnosis not present

## 2014-07-04 ENCOUNTER — Other Ambulatory Visit: Payer: Self-pay | Admitting: Internal Medicine

## 2014-07-12 ENCOUNTER — Ambulatory Visit (INDEPENDENT_AMBULATORY_CARE_PROVIDER_SITE_OTHER): Payer: BC Managed Care – PPO | Admitting: Ophthalmology

## 2014-07-12 DIAGNOSIS — E11339 Type 2 diabetes mellitus with moderate nonproliferative diabetic retinopathy without macular edema: Secondary | ICD-10-CM

## 2014-07-12 DIAGNOSIS — E11319 Type 2 diabetes mellitus with unspecified diabetic retinopathy without macular edema: Secondary | ICD-10-CM

## 2014-07-12 DIAGNOSIS — H35033 Hypertensive retinopathy, bilateral: Secondary | ICD-10-CM

## 2014-07-12 DIAGNOSIS — I10 Essential (primary) hypertension: Secondary | ICD-10-CM

## 2014-07-12 DIAGNOSIS — H43813 Vitreous degeneration, bilateral: Secondary | ICD-10-CM

## 2014-08-07 ENCOUNTER — Other Ambulatory Visit: Payer: Self-pay | Admitting: Physician Assistant

## 2014-08-27 ENCOUNTER — Inpatient Hospital Stay (HOSPITAL_COMMUNITY)
Admission: EM | Admit: 2014-08-27 | Discharge: 2014-09-03 | DRG: 872 | Disposition: A | Discharge status: Home Health | Payer: Medicare Other | Attending: Internal Medicine | Admitting: Internal Medicine

## 2014-08-27 ENCOUNTER — Encounter (HOSPITAL_COMMUNITY): Payer: Self-pay | Admitting: Emergency Medicine

## 2014-08-27 DIAGNOSIS — I739 Peripheral vascular disease, unspecified: Secondary | ICD-10-CM | POA: Diagnosis present

## 2014-08-27 DIAGNOSIS — Z87442 Personal history of urinary calculi: Secondary | ICD-10-CM

## 2014-08-27 DIAGNOSIS — M25572 Pain in left ankle and joints of left foot: Secondary | ICD-10-CM | POA: Diagnosis not present

## 2014-08-27 DIAGNOSIS — M79605 Pain in left leg: Secondary | ICD-10-CM | POA: Diagnosis not present

## 2014-08-27 DIAGNOSIS — L0390 Cellulitis, unspecified: Secondary | ICD-10-CM | POA: Diagnosis present

## 2014-08-27 DIAGNOSIS — R739 Hyperglycemia, unspecified: Secondary | ICD-10-CM

## 2014-08-27 DIAGNOSIS — E118 Type 2 diabetes mellitus with unspecified complications: Secondary | ICD-10-CM

## 2014-08-27 DIAGNOSIS — L03116 Cellulitis of left lower limb: Secondary | ICD-10-CM | POA: Diagnosis not present

## 2014-08-27 DIAGNOSIS — E1165 Type 2 diabetes mellitus with hyperglycemia: Secondary | ICD-10-CM | POA: Diagnosis present

## 2014-08-27 DIAGNOSIS — A419 Sepsis, unspecified organism: Secondary | ICD-10-CM | POA: Diagnosis not present

## 2014-08-27 DIAGNOSIS — S91009A Unspecified open wound, unspecified ankle, initial encounter: Secondary | ICD-10-CM

## 2014-08-27 DIAGNOSIS — I10 Essential (primary) hypertension: Secondary | ICD-10-CM | POA: Diagnosis present

## 2014-08-27 DIAGNOSIS — I639 Cerebral infarction, unspecified: Secondary | ICD-10-CM | POA: Diagnosis present

## 2014-08-27 DIAGNOSIS — Z79899 Other long term (current) drug therapy: Secondary | ICD-10-CM

## 2014-08-27 DIAGNOSIS — Z6841 Body Mass Index (BMI) 40.0 and over, adult: Secondary | ICD-10-CM

## 2014-08-27 DIAGNOSIS — E1151 Type 2 diabetes mellitus with diabetic peripheral angiopathy without gangrene: Secondary | ICD-10-CM | POA: Diagnosis not present

## 2014-08-27 DIAGNOSIS — I5032 Chronic diastolic (congestive) heart failure: Secondary | ICD-10-CM | POA: Diagnosis not present

## 2014-08-27 DIAGNOSIS — M109 Gout, unspecified: Secondary | ICD-10-CM | POA: Diagnosis present

## 2014-08-27 DIAGNOSIS — I4891 Unspecified atrial fibrillation: Secondary | ICD-10-CM | POA: Diagnosis present

## 2014-08-27 DIAGNOSIS — E785 Hyperlipidemia, unspecified: Secondary | ICD-10-CM | POA: Diagnosis present

## 2014-08-27 DIAGNOSIS — D631 Anemia in chronic kidney disease: Secondary | ICD-10-CM | POA: Diagnosis present

## 2014-08-27 DIAGNOSIS — D649 Anemia, unspecified: Secondary | ICD-10-CM | POA: Diagnosis present

## 2014-08-27 DIAGNOSIS — I129 Hypertensive chronic kidney disease with stage 1 through stage 4 chronic kidney disease, or unspecified chronic kidney disease: Secondary | ICD-10-CM | POA: Diagnosis present

## 2014-08-27 DIAGNOSIS — Z7901 Long term (current) use of anticoagulants: Secondary | ICD-10-CM

## 2014-08-27 DIAGNOSIS — N182 Chronic kidney disease, stage 2 (mild): Secondary | ICD-10-CM | POA: Diagnosis present

## 2014-08-27 DIAGNOSIS — G4733 Obstructive sleep apnea (adult) (pediatric): Secondary | ICD-10-CM | POA: Diagnosis present

## 2014-08-27 DIAGNOSIS — IMO0002 Reserved for concepts with insufficient information to code with codable children: Secondary | ICD-10-CM | POA: Diagnosis present

## 2014-08-27 DIAGNOSIS — K219 Gastro-esophageal reflux disease without esophagitis: Secondary | ICD-10-CM | POA: Diagnosis present

## 2014-08-27 HISTORY — DX: Gout, unspecified: M10.9

## 2014-08-27 NOTE — ED Notes (Signed)
Pt arrived to the ED with a complaint of left foot pain.  Pt states he has gout which he takes medication for.  Pt has been compliant with his medications.  Pt states he has had an appearance of abscess on the rear of the ankle.  Pt abscess has grown over the day.  Pt presently has a large blood blister and redness to the back of the left foot.

## 2014-08-28 ENCOUNTER — Emergency Department (HOSPITAL_COMMUNITY): Payer: Medicare Other

## 2014-08-28 DIAGNOSIS — I739 Peripheral vascular disease, unspecified: Secondary | ICD-10-CM | POA: Diagnosis present

## 2014-08-28 DIAGNOSIS — G4733 Obstructive sleep apnea (adult) (pediatric): Secondary | ICD-10-CM | POA: Diagnosis present

## 2014-08-28 DIAGNOSIS — D631 Anemia in chronic kidney disease: Secondary | ICD-10-CM | POA: Diagnosis present

## 2014-08-28 DIAGNOSIS — A419 Sepsis, unspecified organism: Secondary | ICD-10-CM | POA: Diagnosis present

## 2014-08-28 DIAGNOSIS — L0390 Cellulitis, unspecified: Secondary | ICD-10-CM | POA: Diagnosis present

## 2014-08-28 DIAGNOSIS — Z79899 Other long term (current) drug therapy: Secondary | ICD-10-CM | POA: Diagnosis not present

## 2014-08-28 DIAGNOSIS — I639 Cerebral infarction, unspecified: Secondary | ICD-10-CM

## 2014-08-28 DIAGNOSIS — M7989 Other specified soft tissue disorders: Secondary | ICD-10-CM

## 2014-08-28 DIAGNOSIS — N182 Chronic kidney disease, stage 2 (mild): Secondary | ICD-10-CM | POA: Diagnosis not present

## 2014-08-28 DIAGNOSIS — D649 Anemia, unspecified: Secondary | ICD-10-CM | POA: Diagnosis not present

## 2014-08-28 DIAGNOSIS — M109 Gout, unspecified: Secondary | ICD-10-CM

## 2014-08-28 DIAGNOSIS — I129 Hypertensive chronic kidney disease with stage 1 through stage 4 chronic kidney disease, or unspecified chronic kidney disease: Secondary | ICD-10-CM | POA: Diagnosis present

## 2014-08-28 DIAGNOSIS — I10 Essential (primary) hypertension: Secondary | ICD-10-CM | POA: Diagnosis not present

## 2014-08-28 DIAGNOSIS — Z7901 Long term (current) use of anticoagulants: Secondary | ICD-10-CM | POA: Diagnosis not present

## 2014-08-28 DIAGNOSIS — E118 Type 2 diabetes mellitus with unspecified complications: Secondary | ICD-10-CM | POA: Diagnosis not present

## 2014-08-28 DIAGNOSIS — E1151 Type 2 diabetes mellitus with diabetic peripheral angiopathy without gangrene: Secondary | ICD-10-CM | POA: Diagnosis present

## 2014-08-28 DIAGNOSIS — M79605 Pain in left leg: Secondary | ICD-10-CM | POA: Diagnosis not present

## 2014-08-28 DIAGNOSIS — I4891 Unspecified atrial fibrillation: Secondary | ICD-10-CM

## 2014-08-28 DIAGNOSIS — I5032 Chronic diastolic (congestive) heart failure: Secondary | ICD-10-CM

## 2014-08-28 DIAGNOSIS — L03116 Cellulitis of left lower limb: Secondary | ICD-10-CM

## 2014-08-28 DIAGNOSIS — K219 Gastro-esophageal reflux disease without esophagitis: Secondary | ICD-10-CM | POA: Diagnosis present

## 2014-08-28 DIAGNOSIS — Z87442 Personal history of urinary calculi: Secondary | ICD-10-CM | POA: Diagnosis not present

## 2014-08-28 DIAGNOSIS — Z6841 Body Mass Index (BMI) 40.0 and over, adult: Secondary | ICD-10-CM | POA: Diagnosis not present

## 2014-08-28 DIAGNOSIS — M25572 Pain in left ankle and joints of left foot: Secondary | ICD-10-CM | POA: Diagnosis not present

## 2014-08-28 DIAGNOSIS — E785 Hyperlipidemia, unspecified: Secondary | ICD-10-CM | POA: Diagnosis present

## 2014-08-28 DIAGNOSIS — E1165 Type 2 diabetes mellitus with hyperglycemia: Secondary | ICD-10-CM | POA: Diagnosis present

## 2014-08-28 LAB — COMPREHENSIVE METABOLIC PANEL
ALT: 14 U/L (ref 0–53)
ANION GAP: 9 (ref 5–15)
AST: 16 U/L (ref 0–37)
Albumin: 3.4 g/dL — ABNORMAL LOW (ref 3.5–5.2)
Alkaline Phosphatase: 54 U/L (ref 39–117)
BUN: 22 mg/dL (ref 6–23)
CO2: 25 mmol/L (ref 19–32)
Calcium: 8.9 mg/dL (ref 8.4–10.5)
Chloride: 101 mEq/L (ref 96–112)
Creatinine, Ser: 1.24 mg/dL (ref 0.50–1.35)
GFR calc Af Amer: 67 mL/min — ABNORMAL LOW (ref >90)
GFR calc non Af Amer: 58 mL/min — ABNORMAL LOW (ref >90)
GLUCOSE: 420 mg/dL — AB (ref 70–99)
Potassium: 4.1 mmol/L (ref 3.5–5.1)
Sodium: 135 mmol/L (ref 135–145)
TOTAL PROTEIN: 7.7 g/dL (ref 6.0–8.3)
Total Bilirubin: 0.8 mg/dL (ref 0.3–1.2)

## 2014-08-28 LAB — GLUCOSE, CAPILLARY
GLUCOSE-CAPILLARY: 372 mg/dL — AB (ref 70–99)
GLUCOSE-CAPILLARY: 258 mg/dL — AB (ref 70–99)
Glucose-Capillary: 334 mg/dL — ABNORMAL HIGH (ref 70–99)
Glucose-Capillary: 212 mg/dL — ABNORMAL HIGH (ref 70–99)

## 2014-08-28 LAB — HEMOGLOBIN A1C
HEMOGLOBIN A1C: 9.1 % — AB (ref <5.7)
Mean Plasma Glucose: 214 mg/dL — ABNORMAL HIGH (ref <117)

## 2014-08-28 LAB — CBC WITH DIFFERENTIAL/PLATELET
Basophils Absolute: 0 10*3/uL (ref 0.0–0.1)
Basophils Relative: 0 % (ref 0–1)
Eosinophils Absolute: 0.1 10*3/uL (ref 0.0–0.7)
Eosinophils Relative: 0 % (ref 0–5)
HEMATOCRIT: 31.7 % — AB (ref 39.0–52.0)
Hemoglobin: 10.1 g/dL — ABNORMAL LOW (ref 13.0–17.0)
Lymphocytes Relative: 5 % — ABNORMAL LOW (ref 12–46)
Lymphs Abs: 0.8 10*3/uL (ref 0.7–4.0)
MCH: 26 pg (ref 26.0–34.0)
MCHC: 31.9 g/dL (ref 30.0–36.0)
MCV: 81.5 fL (ref 78.0–100.0)
MONO ABS: 1.2 10*3/uL — AB (ref 0.1–1.0)
Monocytes Relative: 8 % (ref 3–12)
Neutro Abs: 12.5 10*3/uL — ABNORMAL HIGH (ref 1.7–7.7)
Neutrophils Relative %: 86 % — ABNORMAL HIGH (ref 43–77)
Platelets: 209 10*3/uL (ref 150–400)
RBC: 3.89 MIL/uL — AB (ref 4.22–5.81)
RDW: 15.6 % — ABNORMAL HIGH (ref 11.5–15.5)
WBC: 14.6 10*3/uL — ABNORMAL HIGH (ref 4.0–10.5)

## 2014-08-28 LAB — SEDIMENTATION RATE: Sed Rate: 75 mm/hr — ABNORMAL HIGH (ref 0–16)

## 2014-08-28 LAB — CBG MONITORING, ED: GLUCOSE-CAPILLARY: 413 mg/dL — AB (ref 70–99)

## 2014-08-28 LAB — C-REACTIVE PROTEIN: CRP: 12.9 mg/dL — ABNORMAL HIGH (ref <0.60)

## 2014-08-28 LAB — TSH: TSH: 2.57 u[IU]/mL (ref 0.350–4.500)

## 2014-08-28 LAB — I-STAT CG4 LACTIC ACID, ED: Lactic Acid, Venous: 1.79 mmol/L (ref 0.5–2.2)

## 2014-08-28 MED ORDER — INSULIN ASPART 100 UNIT/ML ~~LOC~~ SOLN
0.0000 [IU] | Freq: Three times a day (TID) | SUBCUTANEOUS | Status: DC
  Administered 2014-08-28: 11 [IU] via SUBCUTANEOUS
  Administered 2014-08-28: 20 [IU] via SUBCUTANEOUS
  Administered 2014-08-28: 15 [IU] via SUBCUTANEOUS
  Administered 2014-08-29: 7 [IU] via SUBCUTANEOUS
  Administered 2014-08-29: 11 [IU] via SUBCUTANEOUS
  Administered 2014-08-29: 4 [IU] via SUBCUTANEOUS
  Administered 2014-08-30: 11 [IU] via SUBCUTANEOUS
  Administered 2014-08-30: 7 [IU] via SUBCUTANEOUS
  Administered 2014-08-30: 4 [IU] via SUBCUTANEOUS
  Administered 2014-08-31: 11 [IU] via SUBCUTANEOUS
  Administered 2014-08-31 – 2014-09-01 (×4): 7 [IU] via SUBCUTANEOUS
  Administered 2014-09-01 – 2014-09-02 (×2): 4 [IU] via SUBCUTANEOUS
  Administered 2014-09-02 – 2014-09-03 (×4): 7 [IU] via SUBCUTANEOUS

## 2014-08-28 MED ORDER — TAMSULOSIN HCL 0.4 MG PO CAPS
0.4000 mg | ORAL_CAPSULE | Freq: Every day | ORAL | Status: DC
  Administered 2014-08-28 – 2014-09-03 (×7): 0.4 mg via ORAL
  Filled 2014-08-28 (×13): qty 1

## 2014-08-28 MED ORDER — ACETAMINOPHEN 325 MG PO TABS: 650.0000 mg | ORAL_TABLET | Freq: Four times a day (QID) | ORAL | Status: DC | PRN

## 2014-08-28 MED ORDER — HYDROCODONE-ACETAMINOPHEN 5-325 MG PO TABS
1.0000 | ORAL_TABLET | ORAL | Status: DC | PRN
  Administered 2014-08-28 – 2014-09-03 (×24): 2 via ORAL
  Filled 2014-08-28 (×25): qty 2

## 2014-08-28 MED ORDER — VANCOMYCIN HCL IN DEXTROSE 1-5 GM/200ML-% IV SOLN
1000.0000 mg | Freq: Two times a day (BID) | INTRAVENOUS | Status: DC
  Administered 2014-08-28 – 2014-09-01 (×8): 1000 mg via INTRAVENOUS
  Filled 2014-08-28 (×8): qty 200

## 2014-08-28 MED ORDER — ACETAMINOPHEN 325 MG PO TABS
650.0000 mg | ORAL_TABLET | Freq: Once | ORAL | Status: AC
  Administered 2014-08-28: 650 mg via ORAL
  Filled 2014-08-28: qty 2

## 2014-08-28 MED ORDER — FEBUXOSTAT 40 MG PO TABS
40.0000 mg | ORAL_TABLET | Freq: Every day | ORAL | Status: DC
Start: 2014-08-28 — End: 2014-09-03
  Administered 2014-08-28 – 2014-09-03 (×7): 40 mg via ORAL
  Filled 2014-08-28 (×9): qty 1

## 2014-08-28 MED ORDER — ONDANSETRON HCL 4 MG/2ML IJ SOLN: 4.0000 mg | Freq: Four times a day (QID) | INTRAMUSCULAR | Status: DC | PRN

## 2014-08-28 MED ORDER — FENTANYL CITRATE 0.05 MG/ML IJ SOLN
50.0000 ug | Freq: Once | INTRAMUSCULAR | Status: AC
  Administered 2014-08-28: 50 ug via INTRAVENOUS
  Filled 2014-08-28: qty 2

## 2014-08-28 MED ORDER — ACETAMINOPHEN 650 MG RE SUPP: 650.0000 mg | Freq: Four times a day (QID) | RECTAL | Status: DC | PRN

## 2014-08-28 MED ORDER — VANCOMYCIN HCL IN DEXTROSE 1-5 GM/200ML-% IV SOLN: 1000.0000 mg | Freq: Two times a day (BID) | INTRAVENOUS | Status: DC

## 2014-08-28 MED ORDER — VANCOMYCIN HCL IN DEXTROSE 1-5 GM/200ML-% IV SOLN
1000.0000 mg | Freq: Once | INTRAVENOUS | Status: AC
  Administered 2014-08-28: 1000 mg via INTRAVENOUS
  Filled 2014-08-28: qty 200

## 2014-08-28 MED ORDER — ONDANSETRON HCL 4 MG PO TABS: 4.0000 mg | ORAL_TABLET | Freq: Four times a day (QID) | ORAL | Status: DC | PRN

## 2014-08-28 MED ORDER — INSULIN ASPART 100 UNIT/ML ~~LOC~~ SOLN
0.0000 [IU] | Freq: Every day | SUBCUTANEOUS | Status: DC
  Administered 2014-08-28 – 2014-08-29 (×2): 2 [IU] via SUBCUTANEOUS
  Administered 2014-08-30: 3 [IU] via SUBCUTANEOUS
  Administered 2014-08-31 – 2014-09-02 (×3): 2 [IU] via SUBCUTANEOUS

## 2014-08-28 MED ORDER — SODIUM CHLORIDE 0.9 % IV SOLN
INTRAVENOUS | Status: AC
  Administered 2014-08-28: 08:00:00 via INTRAVENOUS

## 2014-08-28 MED ORDER — APIXABAN 5 MG PO TABS
5.0000 mg | ORAL_TABLET | Freq: Two times a day (BID) | ORAL | Status: DC
  Administered 2014-08-28 – 2014-09-03 (×13): 5 mg via ORAL
  Filled 2014-08-28 (×16): qty 1

## 2014-08-28 MED ORDER — HYDRALAZINE HCL 20 MG/ML IJ SOLN: 10.0000 mg | INTRAMUSCULAR | Status: DC | PRN

## 2014-08-28 MED ORDER — PIPERACILLIN-TAZOBACTAM 3.375 G IVPB
3.3750 g | Freq: Three times a day (TID) | INTRAVENOUS | Status: DC
  Administered 2014-08-28 – 2014-09-01 (×12): 3.375 g via INTRAVENOUS
  Filled 2014-08-28 (×16): qty 50

## 2014-08-28 MED ORDER — DILTIAZEM HCL ER COATED BEADS 180 MG PO CP24
360.0000 mg | ORAL_CAPSULE | Freq: Every day | ORAL | Status: DC
  Administered 2014-08-28 – 2014-09-03 (×7): 360 mg via ORAL
  Filled 2014-08-28 (×4): qty 2
  Filled 2014-08-28: qty 1
  Filled 2014-08-28 (×3): qty 2

## 2014-08-28 MED ORDER — SODIUM CHLORIDE 0.9 % IJ SOLN
3.0000 mL | Freq: Two times a day (BID) | INTRAMUSCULAR | Status: DC
  Administered 2014-08-30 – 2014-09-03 (×5): 3 mL via INTRAVENOUS

## 2014-08-28 MED ORDER — HYDROMORPHONE HCL 1 MG/ML IJ SOLN
1.0000 mg | Freq: Once | INTRAMUSCULAR | Status: AC
  Administered 2014-08-28: 1 mg via INTRAVENOUS
  Filled 2014-08-28: qty 1

## 2014-08-28 MED ORDER — PIPERACILLIN-TAZOBACTAM 3.375 G IVPB
3.3750 g | Freq: Once | INTRAVENOUS | Status: AC
  Administered 2014-08-28: 3.375 g via INTRAVENOUS
  Filled 2014-08-28: qty 50

## 2014-08-28 MED ORDER — SIMVASTATIN 20 MG PO TABS
20.0000 mg | ORAL_TABLET | Freq: Every day | ORAL | Status: DC
  Administered 2014-08-28 – 2014-09-02 (×6): 20 mg via ORAL
  Filled 2014-08-28 (×7): qty 1

## 2014-08-28 MED ORDER — SODIUM CHLORIDE 0.9 % IV BOLUS (SEPSIS)
1000.0000 mL | Freq: Once | INTRAVENOUS | Status: AC
  Administered 2014-08-28: 1000 mL via INTRAVENOUS

## 2014-08-28 MED ORDER — POTASSIUM CHLORIDE CRYS ER 20 MEQ PO TBCR
20.0000 meq | EXTENDED_RELEASE_TABLET | Freq: Every day | ORAL | Status: DC
  Administered 2014-08-28 – 2014-09-03 (×7): 20 meq via ORAL
  Filled 2014-08-28 (×7): qty 1

## 2014-08-28 MED ORDER — ESCITALOPRAM OXALATE 10 MG PO TABS
10.0000 mg | ORAL_TABLET | Freq: Every day | ORAL | Status: DC
  Administered 2014-08-28 – 2014-09-03 (×7): 10 mg via ORAL
  Filled 2014-08-28 (×8): qty 1

## 2014-08-28 MED ORDER — PANTOPRAZOLE SODIUM 40 MG PO TBEC
40.0000 mg | DELAYED_RELEASE_TABLET | Freq: Every day | ORAL | Status: DC
  Administered 2014-08-28 – 2014-09-03 (×7): 40 mg via ORAL
  Filled 2014-08-28 (×8): qty 1

## 2014-08-28 NOTE — Evaluation (Signed)
Physical Therapy Evaluation Patient Details Name: Oscar Oscar [name] MRN: [id number] DOB: 07-20-1946 Today's Date: 08/28/2014   History of Present Illness  69 year old male with a history of diabetes, hypertension, atrial fibrillation on anticoagulation, presented with pain in the left ankle area and admitted for further management of cellulitis.  Clinical Impression  Pt admitted with above diagnosis. Pt currently with functional limitations due to the deficits listed below (see PT Problem List).  Pt will benefit from skilled PT to increase their independence and safety with mobility to allow discharge to the venue listed below.  Pt reports having DM however does not check feet.  Pt states L plantar surface wound appeared when he noticed L Achilles blister/wound while showering approx 4 days ago.  Pt would likely benefit from further diabetes education/reinforcement.  Recommended pt use RW at home for decreasing weight through L LE for pain control.     Follow Up Recommendations Oscar [name] PT follow up    Equipment Recommendations  Rolling walker with 5" wheels    Recommendations for Other Services       Precautions / Restrictions Precautions Precautions: None      Mobility  Bed Mobility Overal bed mobility: Modified Independent             General bed mobility comments: increased time and effort likely due to body habitus  Transfers Overall transfer level: Needs assistance Equipment used: 1 person hand held assist (used UE assist from rail) Transfers: Sit to/from Stand Sit to Stand: Min guard         General transfer comment: wide BOS for more comfort of L achilles area, provided RW for pt pain control  Ambulation/Gait Ambulation/Gait assistance: Min guard Ambulation Distance (Feet): 20 Feet Assistive device: Rolling walker (2 wheeled) Gait Pattern/deviations: Step-to pattern;Antalgic;Wide base of support Gait velocity: decr   General Gait Details: wide BOS, limited L DF  and increased L toe out, provided RW for pain control and less WBing on L LE, pt reports pain increased to 9/10 with mobility limiting distance  Stairs            Wheelchair Mobility    Modified Rankin (Stroke Patients Only)       Balance                                             Pertinent Vitals/Pain Pain Assessment: 0-10 Pain Score: 9  Pain Location: L foot Pain Descriptors / Indicators: Sore;Aching Pain Intervention(s): Limited activity within patient's tolerance;Monitored during session;Repositioned;Patient requesting pain meds-RN notified    Home Living Family/patient expects to be discharged to:: Private residence Living Arrangements: Spouse/significant other;Children   Type of Home: House       Home Layout: Able to live on main level with bedroom/bathroom Home Equipment: None      Prior Function Level of Independence: Independent               Hand Dominance        Extremity/Trunk Assessment               Lower Extremity Assessment: LLE deficits/detail   LLE Deficits / Details: limited DF actively during session observed, pt reporting pain with stretch on Achilles tendon due to blister/wound area, also with what appears to be plantar surface 1st metarsal head area wound     Communication   Communication: Oscar [name] difficulties  Cognition Arousal/Alertness: Awake/alert Behavior During Therapy: WFL for tasks assessed/performed Overall Cognitive Status: Within Functional Limits for tasks assessed                      General Comments      Exercises        Assessment/Plan    PT Assessment Patient needs continued PT services  PT Diagnosis Difficulty walking;Acute pain   PT Problem List Decreased mobility;Pain;Decreased knowledge of use of DME  PT Treatment Interventions DME instruction;Gait training;Functional mobility training;Therapeutic activities;Therapeutic exercise;Patient/family education;Stair  training   PT Goals (Current goals can be found in the Care Plan section) Acute Rehab PT Goals PT Goal Formulation: With patient Time For Goal Achievement: 09/04/14 Potential to Achieve Goals: Good    Frequency Min 3X/week   Barriers to discharge        Co-evaluation               End of Session   Activity Tolerance: Patient limited by pain Patient left: in bed;with call bell/phone within reach Nurse Communication: Mobility status;Patient requests pain meds         Time: 1010-1028 PT Time Calculation (min) (ACUTE ONLY): 18 min   Charges:   PT Evaluation $Initial PT Evaluation Tier I: 1 Procedure PT Treatments $Gait Training: 8-22 mins   PT G Codes:        Oscar Oscar [name],Oscar Oscar [name] 08/28/2014, 12:37 PM Oscar Oscar [name], PT, DPT 08/28/2014 Pager: [phone]

## 2014-08-28 NOTE — Progress Notes (Signed)
Patient self administered home CPAP machine. [facility] called by Day RT to come and check. Patient tolerating well.

## 2014-08-28 NOTE — ED Provider Notes (Signed)
CSN: [id number]     Arrival date & time 08/27/14  2228 History   First MD Initiated Contact with Patient 08/28/14 0120     Chief Complaint  Patient presents with  . Foot Swelling  . Abscess    (Consider location/radiation/quality/duration/timing/severity/associated sxs/prior Treatment) HPI Comments: Patient is a 69 year old male with a history of diabetes mellitus, atrial fibrillation on chronic Eliquis, esophageal reflux, hypertension, and gout. Patient presents to the emergency department for further evaluation of pain to his posterior left ankle and left heel. Patient states that his symptoms began 4 days ago. He thought his pain was initially attributed to a gout flare. He states that his symptoms have continued to worsen since onset. He has noted increased pain with weightbearing as well as palpation to his left heel. He has also noticed that the area has become more red in appearance. Patient reports a black appearing blister to his heel which has worsened throughout the day today. He has noticed that his CBG has been reading high lately as well. No reported injury, fever, numbness, weakness, or weeping/drainage. No associated red linear streaking.  Patient is a 69 y.o. male presenting with abscess. The history is provided by the patient and a relative. No language interpreter was used.  Abscess Associated symptoms: no fever     Past Medical History  Diagnosis Date  . Diabetes mellitus without complication     improved after diet/exercise  . Hypertension     improved after diet/exercise  . Kidney stones   . GERD (gastroesophageal reflux disease)   . H/O hiatal hernia   . Arthritis     hips  . OSA (obstructive sleep apnea)     wears cpap  . Anal fissure     h/o - no recent complications  . Atrial fibrillation   . Gout    Past Surgical History  Procedure Laterality Date  . Cholecystectomy    . Knee arthroscopy    . Carpel tunnel    . Ligament repair    . Kidney stone  surgery     Family History  Problem Relation Age of Onset  . COPD Father   . Heart attack Sister     died age 24 of MI   History  Substance Use Topics  . Smoking status: Never Smoker   . Smokeless tobacco: Never Used  . Alcohol Use: Yes     Comment: rare - 1-2x /yr    Review of Systems  Constitutional: Negative for fever.  Respiratory: Negative for shortness of breath.   Musculoskeletal: Positive for myalgias and joint swelling.  Skin: Positive for color change. Negative for wound.  Neurological: Negative for weakness and numbness.  All other systems reviewed and are negative.   Allergies  Iohexol and Ivp dye  Home Medications   Prior to Admission medications   Medication Sig Start Date End Date Taking? Authorizing Provider  acetaminophen (TYLENOL) 500 MG tablet Take 1,500 mg by mouth every 6 (six) hours as needed for moderate pain.   Yes Historical Provider, MD  diltiazem (CARDIZEM CD) 360 MG 24 hr capsule Take 1 capsule (360 mg total) by mouth daily. 02/20/14  Yes Evans Lance, MD  ELIQUIS 5 MG TABS tablet TAKE 1 TABLET BY MOUTH TWICE DAILY 07/05/14  Yes Evans Lance, MD  escitalopram (LEXAPRO) 10 MG tablet Take 10 mg by mouth daily.   Yes Historical Provider, MD  febuxostat (ULORIC) 40 MG tablet Take 40 mg by mouth daily. 08/08/13  Yes Josetta Huddle, MD  furosemide (LASIX) 40 MG tablet Take 1 tablet (40 mg total) by mouth daily. 08/31/13  Yes Imogene Burn, PA-C  glyBURIDE (DIABETA) 5 MG tablet Take 1 tablet (5 mg total) by mouth 2 (two) times daily with a meal. 08/08/13  Yes Josetta Huddle, MD  indomethacin (INDOCIN) 50 MG capsule Take 50 mg by mouth 3 (three) times daily as needed (for gout flare ups).    Yes Historical Provider, MD  insulin aspart (NOVOLOG) 100 UNIT/ML injection Inject 0-9 Units into the skin 3 (three) times daily with meals. 08/08/13  Yes Josetta Huddle, MD  metFORMIN (GLUCOPHAGE) 1000 MG tablet Take 1,000 mg by mouth 2 (two) times daily with a  meal.   Yes Historical Provider, MD  pantoprazole (PROTONIX) 40 MG tablet Take 1 tablet (40 mg total) by mouth daily. 08/08/13  Yes Josetta Huddle, MD  potassium chloride SA (K-DUR,KLOR-CON) 20 MEQ tablet TAKE 1 TABLET BY MOUTH DAILY 08/09/14  Yes Evans Lance, MD  simvastatin (ZOCOR) 20 MG tablet Take 20 mg by mouth daily.    Yes Historical Provider, MD  tamsulosin (FLOMAX) 0.4 MG CAPS capsule Take 1 capsule (0.4 mg total) by mouth daily after breakfast. 08/08/13  Yes Josetta Huddle, MD  apixaban (ELIQUIS) 5 MG TABS tablet Take 1 tablet (5 mg total) by mouth 2 (two) times daily. Patient not taking: Reported on 08/28/2014 08/08/13   Josetta Huddle, MD  insulin aspart (NOVOLOG) 100 UNIT/ML injection Inject 0-5 Units into the skin at bedtime. Patient not taking: Reported on 08/28/2014 08/08/13   Josetta Huddle, MD   BP 180/105 mmHg  Pulse 99  Temp(Src) 102 F (38.9 C) (Rectal)  Resp 18  SpO2 92%   Physical Exam  Constitutional: He is oriented to person, place, and time. He appears well-developed and well-nourished. No distress.  Nontoxic/nonseptic appearing  HENT:  Head: Normocephalic and atraumatic.  Eyes: Conjunctivae and EOM are normal. No scleral icterus.  Neck: Normal range of motion.  Cardiovascular: Normal rate, regular rhythm and intact distal pulses.   DP and PT pulses 2+ in LLE  Pulmonary/Chest: Effort normal. No respiratory distress.  Respirations even and unlabored  Musculoskeletal: Normal range of motion. He exhibits edema (2+ in LLE).       Left ankle: He exhibits swelling. He exhibits no deformity and normal pulse. Tenderness. Achilles tendon exhibits pain. Achilles tendon exhibits no defect.       Left foot: There is tenderness and swelling. There is no bony tenderness, normal capillary refill, no crepitus and no deformity.       Feet:  Neurological: He is alert and oriented to person, place, and time. He exhibits normal muscle tone. Coordination normal.  Sensation to light  touch intact in b/l lower extremities  Skin: Skin is warm and dry. No rash noted. He is not diaphoretic. There is erythema (posterior L ankle and L foot). No pallor.  Psychiatric: He has a normal mood and affect. His behavior is normal.  Nursing note and vitals reviewed.   ED Course  Procedures (including critical care time) Labs Review Labs Reviewed  CBC WITH DIFFERENTIAL - Abnormal; Notable for the following:    WBC 14.6 (*)    RBC 3.89 (*)    Hemoglobin 10.1 (*)    HCT 31.7 (*)    RDW 15.6 (*)    Neutrophils Relative % 86 (*)    Neutro Abs 12.5 (*)    Lymphocytes Relative 5 (*)    Monocytes  Absolute 1.2 (*)    All other components within normal limits  COMPREHENSIVE METABOLIC PANEL - Abnormal; Notable for the following:    Glucose, Bld 420 (*)    Albumin 3.4 (*)    GFR calc non Af Amer 58 (*)    GFR calc Af Amer 67 (*)    All other components within normal limits  SEDIMENTATION RATE - Abnormal; Notable for the following:    Sed Rate 75 (*)    All other components within normal limits  CBG MONITORING, ED - Abnormal; Notable for the following:    Glucose-Capillary 413 (*)    All other components within normal limits  CULTURE, BLOOD (ROUTINE X 2)  CULTURE, BLOOD (ROUTINE X 2)  C-REACTIVE PROTEIN  I-STAT CG4 LACTIC ACID, ED    Imaging Review Dg Ankle Complete Left  08/28/2014   CLINICAL DATA:  Ankle pain.  Blood blister on the posterior ankle.  EXAM: LEFT ANKLE COMPLETE - 3+ VIEW  COMPARISON:  Foot radiograph 12/20/2007  FINDINGS: There is no evidence of fracture, dislocation, or joint effusion. No significant or progressive arthropathy from 2009. Contour abnormality of the skin over the Achilles correlates with history of a "blood blister" . No subcutaneous gas or radiopaque foreign body.  IMPRESSION: No acute osseous findings.   Electronically Signed   By: Jorje Guild M.D.   On: 08/28/2014 02:20     EKG Interpretation None       CRITICAL CARE Performed by:  Antonietta Breach   Total critical care time: 31  Critical care time was exclusive of separately billable procedures and treating other patients.  Critical care was necessary to treat or prevent imminent or life-threatening deterioration.  Critical care was time spent personally by me on the following activities: development of treatment plan with patient and/or surrogate as well as nursing, discussions with consultants, evaluation of patient's response to treatment, examination of patient, obtaining history from patient or surrogate, ordering and performing treatments and interventions, ordering and review of laboratory studies, ordering and review of radiographic studies, pulse oximetry and re-evaluation of patient's condition.  MDM   Final diagnoses:  Ankle pain, left  Cellulitis of left lower extremity  Hyperglycemia    69 year old male presents to the emergency department for further evaluation of pain to his left lower extremity. Patient noted to have a likely cellulitis to his posterior left ankle. He is neurovascularly intact on exam. No evidence of subcutaneous gas or osteomyelitis on x-ray. Patient does meet sepsis criteria with fever and leukocytosis with cellulitis as source of infection. Infection causing elevation in blood sugar with hyperglycemia of 420. No evidence of DKA. IVF infusing.   Patient given Vancomycin and Zosyn in ED as well as Tylenol for fever. Dilaudid and fentanyl given for pain control. Will admit to [facility] for further management with IV abx.   Filed Vitals:   08/28/14 0153 08/28/14 0329 08/28/14 0340 08/28/14 0400  BP: 179/101  174/99 180/105  Pulse: 111  56 99  Temp:  102 F (38.9 C)    TempSrc:  Rectal    Resp: 22  18   SpO2: 92%  92% 92%    Antonietta Breach, PA-C 08/28/14 [place], PA-C 08/28/14 [place], PA-C 08/28/14 0762  Julianne Rice, MD 08/28/14 [phone]

## 2014-08-28 NOTE — ED Notes (Signed)
Dr. Posey Pronto at bedside and made aware of BG at 413. Reported that will order sliding scale for pt. And OK to send pt to room.

## 2014-08-28 NOTE — Progress Notes (Signed)
Inpatient Diabetes Program Recommendations  AACE/ADA: New Consensus Statement on Inpatient Glycemic Control (2013)  Target Ranges:  Prepandial:   less than 140 mg/dL      Peak postprandial:   less than 180 mg/dL (1-2 hours)      Critically ill patients:  140 - 180 mg/dL     Results for Oscar [name], Oscar [name] (MRN [id number]) as of 08/28/2014 13:33  Ref. Range 08/28/2014 03:24 08/28/2014 06:45 08/28/2014 11:41  Glucose-Capillary Latest Range: 70-99 mg/dL 413 (H) 372 (H) 334 (H)    Results for Oscar [name], Oscar [name] (MRN [id number]) as of 08/28/2014 13:33  Ref. Range 08/28/2014 07:15  Hgb A1c MFr Bld Latest Range: <5.7 % 9.1 (H)     Admitted with Cellulitis LE.  History of DM, HTN.   Home DM Meds: Glyburide 5 mg bid       Metformin 1000 mg bid       Novolog SSI   Current Orders: Novolog Resistant SSI tid ac + HS   **Glucose levels significantly elevated.    **A1c shows poor control at home.  Needs follow-up with his PCP for better blood glucose management.  **Will speak with patient 1st thing in the AM (01/19)    MD- Please consider starting basal insulin for this patient- Lantus 20 units QHS (0.15 units/kg dosing based on weight of 130 kg)    Will follow Wyn Quaker RN, MSN, CDE Diabetes Coordinator Inpatient Diabetes Program Team Pager: [phone] (8a-10p)

## 2014-08-28 NOTE — Progress Notes (Signed)
ANTICOAGULATION CONSULT NOTE - Initial Consult  Pharmacy Consult for apixaban Indication: atrial fibrillation  Allergies  Allergen Reactions  . Iohexol      Desc: HIVES S/P 13HR.PREMEDS,PT WEIGHS 367LBS,?LOW DOSAGE PREMEDS   . Ivp Dye [Iodinated Diagnostic Agents]     welps     Patient Measurements: Height: 5\' 9"  (175.3 cm) Weight: 287 lb 4.8 oz (130.318 kg) IBW/kg (Calculated) : 70.7 Heparin Dosing Weight:   Vital Signs: Temp: 98.2 F (36.8 C) (01/18 0651) Temp Source: Oral (01/18 0651) BP: 151/85 mmHg (01/18 0651) Pulse Rate: 120 (01/18 0651)  Labs:  Recent Labs  08/28/14 0251  HGB 10.1*  HCT 31.7*  PLT 209  CREATININE 1.24    Estimated Creatinine Clearance: 75.2 mL/min (by C-G formula based on Cr of 1.24).   Medical History: Past Medical History  Diagnosis Date  . Diabetes mellitus without complication     improved after diet/exercise  . Hypertension     improved after diet/exercise  . Kidney stones   . GERD (gastroesophageal reflux disease)   . H/O hiatal hernia   . Arthritis     hips  . OSA (obstructive sleep apnea)     wears cpap  . Anal fissure     h/o - no recent complications  . Atrial fibrillation   . Gout     Assessment: 73 YOM presenting with left leg pain and swelling.  He has history of atrial fibrillation prescribed apixaban.  Pharmacy asked to dose/continue apixaban.  Renal fx: CrCL = 95ml/min (SCR - 1.24) CBC: Hgb = 10.1, pltc WNL  Goal of Therapy:  Dose per indication and dose adjustment parameters   Plan:   Continue apixaban 5mg  PO BID  No further adjustment needed  Follow Hgb and bleeding  Doreene Eland, PharmD, BCPS.   Pager: [phone]  08/28/2014,7:33 AM

## 2014-08-28 NOTE — H&P (Signed)
[facility] History and Physical  Patient: Oscar [name]  UTM:[id number]  DOB: 13-Mar-1946  DOS: the patient was seen and examined on 08/28/2014 PCP: Henrine Screws, MD  Chief Complaint: Left leg swelling and pain  HPI: Oscar [name] is a 69 y.o. male with Past medical history of diabetes mellitus, hypertension, GERD, sleep apnea, A. fib, gout. The patient presented with complaints of left leg swelling that he has noted since Thursday. He mentions the swelling progressively worsening. He developed black color area on the Achilles tendon this morning which later on developed to have blister. He started having difficulty walking secondary to pain in that tendon area over last 2 days and today he was not able to bear any weight. 8 felt feverish denies any chills, chest pain, shortness of breath, cough, burning urination, abdominal pain, diarrhea. He has chronic nonhealing ulcer on the plantar surface of the left foot for which she is doing daily dressing and following up with his doctor regularly. No injury no trauma reported.  The patient is coming from home. And at his baseline independent for most of his ADL.  Review of Systems: as mentioned in the history of present illness.  A Comprehensive review of the other systems is negative.  Past Medical History  Diagnosis Date  . Diabetes mellitus without complication     improved after diet/exercise  . Hypertension     improved after diet/exercise  . Kidney stones   . GERD (gastroesophageal reflux disease)   . H/O hiatal hernia   . Arthritis     hips  . OSA (obstructive sleep apnea)     wears cpap  . Anal fissure     h/o - no recent complications  . Atrial fibrillation   . Gout    Past Surgical History  Procedure Laterality Date  . Cholecystectomy    . Knee arthroscopy    . Carpel tunnel    . Ligament repair    . Kidney stone surgery     Social History:  reports that he has never smoked. He has never used smokeless  tobacco. He reports that he drinks alcohol. He reports that he does not use illicit drugs.  Allergies  Allergen Reactions  . Iohexol      Desc: HIVES S/P 13HR.PREMEDS,PT WEIGHS 367LBS,?LOW DOSAGE PREMEDS   . Ivp Dye [Iodinated Diagnostic Agents]     welps     Family History  Problem Relation Age of Onset  . COPD Father   . Heart attack Sister     died age 64 of MI    Prior to Admission medications   Medication Sig Start Date End Date Taking? Authorizing Provider  acetaminophen (TYLENOL) 500 MG tablet Take 1,500 mg by mouth every 6 (six) hours as needed for moderate pain.   Yes Historical Provider, MD  diltiazem (CARDIZEM CD) 360 MG 24 hr capsule Take 1 capsule (360 mg total) by mouth daily. 02/20/14  Yes Evans Lance, MD  ELIQUIS 5 MG TABS tablet TAKE 1 TABLET BY MOUTH TWICE DAILY 07/05/14  Yes Evans Lance, MD  escitalopram (LEXAPRO) 10 MG tablet Take 10 mg by mouth daily.   Yes Historical Provider, MD  febuxostat (ULORIC) 40 MG tablet Take 40 mg by mouth daily. 08/08/13  Yes Josetta Huddle, MD  furosemide (LASIX) 40 MG tablet Take 1 tablet (40 mg total) by mouth daily. 08/31/13  Yes Imogene Burn, PA-C  glyBURIDE (DIABETA) 5 MG tablet Take 1 tablet (5 mg total)  by mouth 2 (two) times daily with a meal. 08/08/13  Yes Josetta Huddle, MD  indomethacin (INDOCIN) 50 MG capsule Take 50 mg by mouth 3 (three) times daily as needed (for gout flare ups).    Yes Historical Provider, MD  insulin aspart (NOVOLOG) 100 UNIT/ML injection Inject 0-9 Units into the skin 3 (three) times daily with meals. 08/08/13  Yes Josetta Huddle, MD  metFORMIN (GLUCOPHAGE) 1000 MG tablet Take 1,000 mg by mouth 2 (two) times daily with a meal.   Yes Historical Provider, MD  pantoprazole (PROTONIX) 40 MG tablet Take 1 tablet (40 mg total) by mouth daily. 08/08/13  Yes Josetta Huddle, MD  potassium chloride SA (K-DUR,KLOR-CON) 20 MEQ tablet TAKE 1 TABLET BY MOUTH DAILY 08/09/14  Yes Evans Lance, MD  simvastatin  (ZOCOR) 20 MG tablet Take 20 mg by mouth daily.    Yes Historical Provider, MD  tamsulosin (FLOMAX) 0.4 MG CAPS capsule Take 1 capsule (0.4 mg total) by mouth daily after breakfast. 08/08/13  Yes Josetta Huddle, MD  apixaban (ELIQUIS) 5 MG TABS tablet Take 1 tablet (5 mg total) by mouth 2 (two) times daily. Patient not taking: Reported on 08/28/2014 08/08/13   Josetta Huddle, MD  insulin aspart (NOVOLOG) 100 UNIT/ML injection Inject 0-5 Units into the skin at bedtime. Patient not taking: Reported on 08/28/2014 08/08/13   Josetta Huddle, MD    Physical Exam: Filed Vitals:   08/28/14 0340 08/28/14 0400 08/28/14 0500 08/28/14 0522  BP: 174/99 180/105 156/93 152/89  Pulse: 56 99 120 109  Temp:    100.4 F (38 C)  TempSrc:    Oral  Resp: $Remo'18  12 18  'isVPq$ SpO2: 92% 92% 95% 94%    General: Alert, Awake and Oriented to Time, Place and Person. Appear in mild distress Eyes: PERRL ENT: Oral Mucosa clear moist. Neck: no JVD Cardiovascular: S1 and S2 Present, no Murmur, Peripheral Pulses Present Respiratory: Bilateral Air entry equal and Decreased, Clear to Auscultation, noCrackles, no wheezes Abdomen: Bowel Sound present, Soft and no tender Skin: no Rash Extremities: Left Pedal edema , no calf tenderness, but tenderness at the Achilles tendon with black color blister filled with fluid. Neurologic: Grossly no focal neuro deficit.  Labs on Admission:  CBC:  Recent Labs Lab 08/28/14 0251  WBC 14.6*  NEUTROABS 12.5*  HGB 10.1*  HCT 31.7*  MCV 81.5  PLT 209    CMP     Component Value Date/Time   NA 135 08/28/2014 0251   K 4.1 08/28/2014 0251   CL 101 08/28/2014 0251   CO2 25 08/28/2014 0251   GLUCOSE 420* 08/28/2014 0251   BUN 22 08/28/2014 0251   CREATININE 1.24 08/28/2014 0251   CALCIUM 8.9 08/28/2014 0251   PROT 7.7 08/28/2014 0251   ALBUMIN 3.4* 08/28/2014 0251   AST 16 08/28/2014 0251   ALT 14 08/28/2014 0251   ALKPHOS 54 08/28/2014 0251   BILITOT 0.8 08/28/2014 0251   GFRNONAA  58* 08/28/2014 0251   GFRAA 67* 08/28/2014 0251    No results for input(s): LIPASE, AMYLASE in the last 168 hours. No results for input(s): AMMONIA in the last 168 hours.  No results for input(s): CKTOTAL, CKMB, CKMBINDEX, TROPONINI in the last 168 hours. BNP (last 3 results)  Recent Labs  08/31/13 1333  PROBNP 312.0*    Radiological Exams on Admission: Dg Ankle Complete Left  08/28/2014   CLINICAL DATA:  Ankle pain.  Blood blister on the posterior ankle.  EXAM: LEFT ANKLE  COMPLETE - 3+ VIEW  COMPARISON:  Foot radiograph 12/20/2007  FINDINGS: There is no evidence of fracture, dislocation, or joint effusion. No significant or progressive arthropathy from 2009. Contour abnormality of the skin over the Achilles correlates with history of a "blood blister" . No subcutaneous gas or radiopaque foreign body.  IMPRESSION: No acute osseous findings.   Electronically Signed   By: Jorje Guild M.D.   On: 08/28/2014 02:20     Assessment/Plan Principal Problem:   Cellulitis Active Problems:   Atrial fibrillation with RVR   Gout   Diabetes mellitus type 2, uncontrolled, with complications   CKD (chronic kidney disease), stage II   Hypertension   Anemia   CVA (cerebral infarction)   Chronic diastolic heart failure   Sepsis   1. Cellulitis versus Achilles tendinitis The patient is presenting with complaints of left leg swelling and pain. He has fever. ESR is elevated. Along with that he also has leukocytosis and tachycardia as well as hypertension meeting the criteria for early sepsis. With his history of diabetes patient will be treated with vancomycin and Zosyn. Follow cultures. He may require drainage of that fluid filled area. We will get ultrasound of the leg to rule out other abnormalities.  2.A. fib with RVR. Heart rate fluctuating between 90-110. Continue home medication closely monitor. Also continue Eliquis.  2. History of gout. Patient initially felt this symptom as  doubt. Although this is not consistent with his typical gout presentation which involves big toe and dorsum of the foot. Symptoms does not improve then patient may be placed on prednisone  3. History of diabetes mellitus. Patient is currently covered with broad-spectrum antibiotics secondary to his diabetes mellitus. Place him on sliding scale.  4.GERD. Continue Protonix.  5. Accelerated hypertension. Likely secondary to severe pain. Symptoms are currently improving. Use hydralazine as as needed. Placing him on telemetry.  Advance goals of care discussion: Full code  DVT Prophylaxis chronic anticoagulation Nutrition: Cardiac and diabetic diet  Family Communication: Family  was present at bedside, opportunity was given to ask question and all questions were answered satisfactorily at the time of interview. Disposition: Admitted to inpatient in telemetry unit.  Author: Berle Mull, MD [facility] Pager: [phone] 08/28/2014, 5:58 AM    If 7PM-7AM, please contact night-coverage [email or web address] Password [email or web address]

## 2014-08-28 NOTE — Progress Notes (Signed)
[facility] PROGRESS NOTE  Oscar [name] ZOX:[id number] DOB: 1946-07-09 DOA: 08/27/2014  PCP: Henrine Screws, MD  Brief HPI: 69 year old Caucasian male with a history of diabetes, hypertension, atrial fibrillation on anticoagulation, presented with pain in the left ankle area. He was febrile and tachycardic. He was also noted to have elevated WBC. He was admitted for further management of cellulitis.  Past medical history:  Past Medical History  Diagnosis Date  . Diabetes mellitus without complication     improved after diet/exercise  . Hypertension     improved after diet/exercise  . Kidney stones   . GERD (gastroesophageal reflux disease)   . H/O hiatal hernia   . Arthritis     hips  . OSA (obstructive sleep apnea)     wears cpap  . Anal fissure     h/o - no recent complications  . Atrial fibrillation   . Gout     Consultants: None  Procedures:  Venous Doppler of the left lower extremity No DVT  Antibiotics: Vancomycin and Zosyn 1/18  Subjective: Patient feels that the pain is improving. Denies any chest pain or shortness of breath. Denies any trauma to the left ankle recently.  Objective: Vital Signs  Filed Vitals:   08/28/14 0400 08/28/14 0500 08/28/14 0522 08/28/14 0651  BP: 180/105 156/93 152/89 151/85  Pulse: 99 120 109 120  Temp:   100.4 F (38 C) 98.2 F (36.8 C)  TempSrc:   Oral Oral  Resp:  $Remo'12 18 18  'IDVQa$ Height:    '5\' 9"'$  (1.753 m)  Weight:    130.318 kg (287 lb 4.8 oz)  SpO2: 92% 95% 94% 98%   No intake or output data in the 24 hours ending 08/28/14 0749 Filed Weights   08/28/14 0651  Weight: 130.318 kg (287 lb 4.8 oz)    General appearance: alert, cooperative, appears stated age, no distress and moderately obese Resp: clear to auscultation bilaterally Cardio: regular rate and rhythm, S1, S2 normal, no murmur, click, rub or gallop GI: soft, non-tender; bowel sounds normal; no masses,  no organomegaly Extremities: Left lower leg  was examined and found to be erythematous. Warm to touch. There is a blister with blackish discoloration in the posterior aspect close to the heel. No active drainage. He also has a callus on the plantar aspect. Neurologic: Alert and oriented 3. No focal neurological deficits are present.  Lab Results:  Basic Metabolic Panel:  Recent Labs Lab 08/28/14 0251  NA 135  K 4.1  CL 101  CO2 25  GLUCOSE 420*  BUN 22  CREATININE 1.24  CALCIUM 8.9   Liver Function Tests:  Recent Labs Lab 08/28/14 0251  AST 16  ALT 14  ALKPHOS 54  BILITOT 0.8  PROT 7.7  ALBUMIN 3.4*   CBC:  Recent Labs Lab 08/28/14 0251  WBC 14.6*  NEUTROABS 12.5*  HGB 10.1*  HCT 31.7*  MCV 81.5  PLT 209   BNP (last 3 results)  Recent Labs  08/31/13 1333  PROBNP 312.0*   CBG:  Recent Labs Lab 08/28/14 0324 08/28/14 0645  GLUCAP 413* 372*    Studies/Results: Dg Ankle Complete Left  08/28/2014   CLINICAL DATA:  Ankle pain.  Blood blister on the posterior ankle.  EXAM: LEFT ANKLE COMPLETE - 3+ VIEW  COMPARISON:  Foot radiograph 12/20/2007  FINDINGS: There is no evidence of fracture, dislocation, or joint effusion. No significant or progressive arthropathy from 2009. Contour abnormality of the skin over the Achilles correlates  with history of a "blood blister" . No subcutaneous gas or radiopaque foreign body.  IMPRESSION: No acute osseous findings.   Electronically Signed   By: Jorje Guild M.D.   On: 08/28/2014 02:20    Medications:  Scheduled: . apixaban  5 mg Oral BID  . diltiazem  360 mg Oral Daily  . escitalopram  10 mg Oral Daily  . febuxostat  40 mg Oral Daily  . insulin aspart  0-20 Units Subcutaneous TID WC  . insulin aspart  0-5 Units Subcutaneous QHS  . pantoprazole  40 mg Oral Daily  . piperacillin-tazobactam (ZOSYN)  IV  3.375 g Intravenous 3 times per day  . potassium chloride SA  20 mEq Oral Daily  . simvastatin  20 mg Oral q1800  . sodium chloride  3 mL Intravenous Q12H   . tamsulosin  0.4 mg Oral QPC breakfast  . vancomycin  1,000 mg Intravenous Once  . vancomycin  1,000 mg Intravenous Q12H   Continuous: . sodium chloride 75 mL/hr at 08/28/14 0758   TJL:LVDIXVEZBMZTA **OR** acetaminophen, hydrALAZINE, HYDROcodone-acetaminophen, ondansetron **OR** ondansetron (ZOFRAN) IV  Assessment/Plan:  Principal Problem:   Cellulitis Active Problems:   Atrial fibrillation with RVR   Gout   Diabetes mellitus type 2, uncontrolled, with complications   CKD (chronic kidney disease), stage II   Hypertension   Anemia   CVA (cerebral infarction)   Chronic diastolic heart failure   Sepsis    Cellulitis of left ankle versus Achilles tendinitis His presentation is more concerning for cellulitis. He did have reasonably good range of motion of the left ankle. Continue with antibiotics for now. X-ray did not show any skeletal abnormalities. ESR is elevated. Blood cultures are pending. Involve PT and OT. No DVT noted.  A. fib with RVR. Heart rate noted to be slightly elevated. Continue home medications. Also continue Eliquis.  History of diabetes mellitus type 2 Blood sugar is significantly elevated. He is currently only on sliding scale coverage. Consider long-acting insulin. He is just on oral medicines at home with the NovoLog with meals. HbA1c is pending.  History of gout. Continue Uloric. His current presentation does not appear to be gout.   GERD Continue Protonix.  Essential hypertension Blood pressure is now better. Continue home medications.   DVT Prophylaxis: On anticoagulation    Code Status: Full code  Family Communication: Discussed with the patient  Disposition Plan: Not ready for discharge    LOS: 1 day   [facility] Pager [phone] 08/28/2014, 7:49 AM  If 7PM-7AM, please contact night-coverage at [email or web address], password [email or web address]

## 2014-08-28 NOTE — Progress Notes (Addendum)
ANTIBIOTIC CONSULT NOTE - INITIAL  Pharmacy Consult for vancomycin/zosyn Indication: cellulitis  Allergies  Allergen Reactions  . Iohexol      Desc: HIVES S/P 13HR.PREMEDS,PT WEIGHS 367LBS,?LOW DOSAGE PREMEDS   . Ivp Dye [Iodinated Diagnostic Agents]     welps     Patient Measurements: Height: 5\' 9"  (175.3 cm) Weight: 287 lb 4.8 oz (130.318 kg) IBW/kg (Calculated) : 70.7 Adjusted Body Weight:   Vital Signs: Temp: 98.2 F (36.8 C) (01/18 0651) Temp Source: Oral (01/18 0651) BP: 151/85 mmHg (01/18 0651) Pulse Rate: 120 (01/18 0651) Intake/Output from previous day:   Intake/Output from this shift:    Labs:  Recent Labs  08/28/14 0251  WBC 14.6*  HGB 10.1*  PLT 209  CREATININE 1.24   Estimated Creatinine Clearance: 75.2 mL/min (by C-G formula based on Cr of 1.24). No results for input(s): VANCOTROUGH, VANCOPEAK, VANCORANDOM, GENTTROUGH, GENTPEAK, GENTRANDOM, TOBRATROUGH, TOBRAPEAK, TOBRARND, AMIKACINPEAK, AMIKACINTROU, AMIKACIN in the last 72 hours.   Microbiology: No results found for this or any previous visit (from the past 720 hour(s)).  Medical History: Past Medical History  Diagnosis Date  . Diabetes mellitus without complication     improved after diet/exercise  . Hypertension     improved after diet/exercise  . Kidney stones   . GERD (gastroesophageal reflux disease)   . H/O hiatal hernia   . Arthritis     hips  . OSA (obstructive sleep apnea)     wears cpap  . Anal fissure     h/o - no recent complications  . Atrial fibrillation   . Gout    Assessment: 52 YOM presents with leg pain and swelling.  Broad spectrum antibiotics started for possible sepsis (fever, tachycardia [although has afib], leukocytosis) with cellulitis of L leg (vs achilles tendinitis).  Has chronic non-healing ulcer on bottom of L foot (no reports that it looks infected).  Pharmacy asked to dose vancomycin and zosyn.  Vancomycin 1gm and zosyn given in ED.   1/18  >>vancomycin  >> 1/18 >> pip/tazo >>    Tmax: 102 WBCs: 14.6 Renal: SCr WNL with normalized CrCl = 33ml/min  1/18 blood: collected  Goal of Therapy:  Vancomycin trough level 10-15 mcg/ml  Plan:   Vancomycin 1gm IV q12h (give 1st dose now to complete load)  Zosyn 3.375gm IV q8h over 4h infusion  De-esacalate antibiotics as appropriate   Doreene Eland, PharmD, BCPS.   Pager: [phone]  08/28/2014,8:04 AM

## 2014-08-28 NOTE — Progress Notes (Signed)
*  PRELIMINARY RESULTS* Vascular Ultrasound Left lower extremity venous duplex has been completed.  Preliminary findings: no evidence of DVT in visualized veins.   Landry Mellow, RDMS, RVT  08/28/2014, 9:33 AM

## 2014-08-28 NOTE — Evaluation (Signed)
Occupational Therapy Evaluation Patient Details Name: Oscar [name] MRN: [id number] DOB: [birth year] Today's Date: 08/28/2014    History of Present Illness 69 year old male with a history of diabetes, hypertension, atrial fibrillation on anticoagulation, presented with pain in the left ankle area and admitted for further management of cellulitis.   Clinical Impression   Pt limited by pain this session. States pain 5/10 with sitting EOB and currently declined standing to perform ADL with OT as he states the pain was high earlier and he wants to let pain ease off and also rest. Worked on seated ADL at EOB. Will follow to progress ADL independence for d/c home with wife.    Follow Up Recommendations  No OT follow up;Supervision/Assistance - 24 hour    Equipment Recommendations   (TBD )    Recommendations for Other Services       Precautions / Restrictions Precautions Precautions: None      Mobility Bed Mobility Overal bed mobility: Modified Independent               Transfers            General transfer comment: pt declined due to pain.    Balance                                            ADL Overall ADL's : Needs assistance/impaired Eating/Feeding: Independent;Sitting   Grooming: Wash/dry hands;Set up;Sitting   Upper Body Bathing: Set up;Sitting   Lower Body Bathing: Minimal assistance;Sitting/lateral leans Lower Body Bathing Details (indicate cue type and reason): pt declined to stand to wash periareas currently due to pain. Upper Body Dressing : Set up;Sitting   Lower Body Dressing: Moderate assistance;Sitting/lateral leans Lower Body Dressing Details (indicate cue type and reason): mod assist for L sock, pants to start over LE. Pt states painful to reach down to L foot. Able to don/doff R sock.    Toilet Transfer Details (indicate cue type and reason): pt declined standing due to pain right now and requestnig to sleep. Wife also  requesting to hold off on getting up again right now. PT saw pt earlier.            General ADL Comments: Pt's wife states pt did shower right up till admission though not getting around the house overall as much. He could still put on his own socks but painful. She had to bring him his meals more. Pt declined standing during OT session due to pain and had recently worked with PT this am and reports pain was high getting to and from the bathroom.      Vision                     Perception     Praxis      Pertinent Vitals/Pain Pain Assessment: 0-10 Pain Score: 5  Pain Location: L foot sitting on EOB. Pain Descriptors / Indicators: Aching Pain Intervention(s): Repositioned     Hand Dominance     Extremity/Trunk Assessment Upper Extremity Assessment Upper Extremity Assessment: Overall WFL for tasks assessed         Communication Communication Communication: No difficulties   Cognition Arousal/Alertness: Awake/alert Behavior During Therapy: WFL for tasks assessed/performed Overall Cognitive Status: Within Functional Limits for tasks assessed  General Comments       Exercises       Shoulder Instructions      Home Living Family/patient expects to be discharged to:: Private residence Living Arrangements: Spouse/significant other;Children   Type of Home: House       Home Layout: Able to live on main level with bedroom/bathroom     Bathroom Shower/Tub: Teacher, early years/pre: Handicapped height     Home Equipment: None          Prior Functioning/Environment Level of Independence: Independent;Needs assistance    ADL's / Homemaking Assistance Needed: has needed more assist in the last few days with ADL. Wife helped with meals more and getting him to the toilet.         OT Diagnosis: Generalized weakness;Acute pain   OT Problem List: Decreased strength;Decreased knowledge of use of DME or AE;Pain   OT  Treatment/Interventions: Self-care/ADL training;Patient/family education;Therapeutic activities;DME and/or AE instruction    OT Goals(Current goals can be found in the care plan section) Acute Rehab OT Goals Patient Stated Goal: to decrease pain OT Goal Formulation: With patient Time For Goal Achievement: 09/11/14 Potential to Achieve Goals: Good  OT Frequency: Min 2X/week   Barriers to D/C:            Co-evaluation              End of Session    Activity Tolerance: Patient limited by pain Patient left: in bed;with call bell/phone within reach;with family/visitor present   Time: 1220-1240 OT Time Calculation (min): 20 min Charges:  OT General Charges $OT Visit: 1 Procedure OT Evaluation $Initial OT Evaluation Tier I: 1 Procedure OT Treatments $Therapeutic Activity: 8-22 mins G-Codes:    Jules Schick  [phone] 08/28/2014, 12:59 PM

## 2014-08-29 DIAGNOSIS — D649 Anemia, unspecified: Secondary | ICD-10-CM

## 2014-08-29 LAB — CBC WITH DIFFERENTIAL/PLATELET
Basophils Absolute: 0 10*3/uL (ref 0.0–0.1)
Basophils Relative: 0 % (ref 0–1)
Eosinophils Absolute: 0.2 10*3/uL (ref 0.0–0.7)
Eosinophils Relative: 2 % (ref 0–5)
HCT: 31.2 % — ABNORMAL LOW (ref 39.0–52.0)
HEMOGLOBIN: 9.4 g/dL — AB (ref 13.0–17.0)
LYMPHS PCT: 13 % (ref 12–46)
Lymphs Abs: 1.7 10*3/uL (ref 0.7–4.0)
MCH: 25.3 pg — ABNORMAL LOW (ref 26.0–34.0)
MCHC: 30.1 g/dL (ref 30.0–36.0)
MCV: 83.9 fL (ref 78.0–100.0)
MONOS PCT: 8 % (ref 3–12)
Monocytes Absolute: 1.1 10*3/uL — ABNORMAL HIGH (ref 0.1–1.0)
NEUTROS PCT: 77 % (ref 43–77)
Neutro Abs: 9.8 10*3/uL — ABNORMAL HIGH (ref 1.7–7.7)
PLATELETS: 201 10*3/uL (ref 150–400)
RBC: 3.72 MIL/uL — ABNORMAL LOW (ref 4.22–5.81)
RDW: 16.2 % — AB (ref 11.5–15.5)
WBC: 12.8 10*3/uL — AB (ref 4.0–10.5)

## 2014-08-29 LAB — COMPREHENSIVE METABOLIC PANEL
ALK PHOS: 46 U/L (ref 39–117)
ALT: 12 U/L (ref 0–53)
AST: 14 U/L (ref 0–37)
Albumin: 3.1 g/dL — ABNORMAL LOW (ref 3.5–5.2)
Anion gap: 7 (ref 5–15)
BUN: 24 mg/dL — ABNORMAL HIGH (ref 6–23)
CHLORIDE: 105 meq/L (ref 96–112)
CO2: 28 mmol/L (ref 19–32)
CREATININE: 1.33 mg/dL (ref 0.50–1.35)
Calcium: 8.9 mg/dL (ref 8.4–10.5)
GFR calc non Af Amer: 53 mL/min — ABNORMAL LOW (ref >90)
GFR, EST AFRICAN AMERICAN: 61 mL/min — AB (ref >90)
Glucose, Bld: 194 mg/dL — ABNORMAL HIGH (ref 70–99)
Potassium: 3.9 mmol/L (ref 3.5–5.1)
SODIUM: 140 mmol/L (ref 135–145)
Total Bilirubin: 0.8 mg/dL (ref 0.3–1.2)
Total Protein: 7.3 g/dL (ref 6.0–8.3)

## 2014-08-29 LAB — GLUCOSE, CAPILLARY
Glucose-Capillary: 200 mg/dL — ABNORMAL HIGH (ref 70–99)
Glucose-Capillary: 247 mg/dL — ABNORMAL HIGH (ref 70–99)
Glucose-Capillary: 269 mg/dL — ABNORMAL HIGH (ref 70–99)
Glucose-Capillary: 241 mg/dL — ABNORMAL HIGH (ref 70–99)

## 2014-08-29 LAB — PROTIME-INR
INR: 1.61 — AB (ref 0.00–1.49)
PROTHROMBIN TIME: 19.3 s — AB (ref 11.6–15.2)

## 2014-08-29 MED ORDER — MUPIROCIN 2 % EX OINT
TOPICAL_OINTMENT | Freq: Two times a day (BID) | CUTANEOUS | Status: DC
  Administered 2014-08-29: 19:00:00 via NASAL
  Administered 2014-08-29 – 2014-08-30 (×2): 1 via NASAL
  Administered 2014-08-31: 11:00:00 via NASAL
  Filled 2014-08-29 (×3): qty 22

## 2014-08-29 MED ORDER — MORPHINE SULFATE 2 MG/ML IJ SOLN
1.0000 mg | INTRAMUSCULAR | Status: DC | PRN
  Administered 2014-08-29 – 2014-09-03 (×21): 1 mg via INTRAVENOUS
  Filled 2014-08-29 (×22): qty 1

## 2014-08-29 MED ORDER — INSULIN GLARGINE 100 UNIT/ML ~~LOC~~ SOLN
20.0000 [IU] | Freq: Every day | SUBCUTANEOUS | Status: DC
  Administered 2014-08-29 – 2014-08-31 (×3): 20 [IU] via SUBCUTANEOUS
  Filled 2014-08-29 (×3): qty 0.2

## 2014-08-29 NOTE — Progress Notes (Signed)
Inpatient Diabetes Program Recommendations  AACE/ADA: New Consensus Statement on Inpatient Glycemic Control (2013)  Target Ranges:  Prepandial:   less than 140 mg/dL      Peak postprandial:   less than 180 mg/dL (1-2 hours)      Critically ill patients:  140 - 180 mg/dL     Results for Oscar [name], Oscar [name] (MRN [id number]) as of 08/29/2014 10:34  Ref. Range 08/28/2014 06:45 08/28/2014 11:41 08/28/2014 17:08 08/28/2014 22:29  Glucose-Capillary Latest Range: 70-99 mg/dL 372 (H) 334 (H) 258 (H) 212 (H)    Results for [name], [name] (MRN [id number]) as of 08/29/2014 10:34  Ref. Range 08/29/2014 07:53  Glucose-Capillary Latest Range: 70-99 mg/dL 200 (H)    Admitted with Cellulitis LE. History of DM, HTN.   Home DM Meds: Glyburide 5 mg bid  Metformin 1000 mg bid  Novolog SSI prn   Current Orders: Novolog Resistant SSI tid ac + HS      Lantus 20 units daily (to start today)   **Glucose levels significantly elevated.   **A1c shows poor control at home. Needs follow-up with his PCP/Endocrinologist for better blood glucose management.  **Note Lantus 20 units daily started today.  1st dose due at 11am.    Spoke with patient today about his current A1c of 9.1%.  Explained what an A1c is and what it measures.  Reminded patient that his goal A1c is 7% or less per ADA standards to prevent both acute and long-term complications.  Explained to patient the extreme importance of good glucose control at home especially in the setting of infection (good glucose control needed to heal his LE).  Patient told me he started seeing an Endocrinologist about a year ago (cannot remember the MDs name but knows the Endocrinology office is off of [facility].  Could possibly be Dr. Buddy Duty with [facility]?).  Has seen the Endocrinologist twice and needs to make a follow-up appointment soon with the Endocrinology office.  Patient told me he has been using Novolog SSI prn at  home for high blood sugars.  Only takes the Novolog when his blood sugars are really high (300s or so per patient).  Discussed with patient that the MD managing his care here in the hospital may start him on another insulin to help better control his blood sugars so that his lower extremity can heal.  Patient stated he knows how to give injections and can take additional insulin if needed.  Uses vial and syringe at home with his Novolog.   MD- If you discharge patient on basal insulin, please give patient Rx for Lantus vial as patient already knows how to give insulin with vial and syringe  Lantus vial [Order # [id number]] Insulin Syringes 0.5 ml [Order # [id number]]   Will follow Wyn Quaker RN, MSN, CDE Diabetes Coordinator Inpatient Diabetes Program Team Pager: [phone] (8a-10p)

## 2014-08-29 NOTE — Progress Notes (Signed)
[facility] PROGRESS NOTE  NICOLAOS MITRANO ZOX:[id number] DOB: 12-09-1945 DOA: 08/27/2014  PCP: Henrine Screws, MD  Brief HPI: 69 year old Caucasian male with a history of diabetes, hypertension, atrial fibrillation on anticoagulation, presented with pain in the left ankle area. He was febrile and tachycardic. He was also noted to have elevated WBC. He was admitted for further management of cellulitis.  Past medical history:  Past Medical History  Diagnosis Date  . Diabetes mellitus without complication     improved after diet/exercise  . Hypertension     improved after diet/exercise  . Kidney stones   . GERD (gastroesophageal reflux disease)   . H/O hiatal hernia   . Arthritis     hips  . OSA (obstructive sleep apnea)     wears cpap  . Anal fissure     h/o - no recent complications  . Atrial fibrillation   . Gout     Consultants: None  Procedures:  Venous Doppler of the left lower extremity No DVT  Antibiotics: Vancomycin and Zosyn 1/18  Subjective: Patient feels about the same. He is afraid of putting weight on that left ankle. Denies chest pain or shortness of breath.   Objective: Vital Signs  Filed Vitals:   08/28/14 1033 08/28/14 1352 08/28/14 2227 08/29/14 0621  BP: 143/74 108/64 134/82 104/60  Pulse:  83 91 106  Temp:  98 F (36.7 C) 98.2 F (36.8 C) 98.9 F (37.2 C)  TempSrc:  Oral Oral Oral  Resp:  _0 Height:      Weight:    132.2 kg (291 lb 7.2 oz)  SpO2:  97% 95% 94%    Intake/Output Summary (Last 24 hours) at 08/29/14 0810 Last data filed at 08/28/14 2252  Gross per 24 hour  Intake   2240 ml  Output      0 ml  Net   2240 ml   Filed Weights   08/28/14 0651 08/29/14 0621  Weight: 130.318 kg (287 lb 4.8 oz) 132.2 kg (291 lb 7.2 oz)    General appearance: alert, cooperative, appears stated age, no distress and moderately obese Resp: clear to auscultation bilaterally Cardio: regular rate and rhythm, S1, S2 normal, no  murmur, click, rub or gallop GI: soft, non-tender; bowel sounds normal; no masses,  no organomegaly Extremities: Left lower leg was examined and found to be erythematous. Less so today than yesterday. Less warm to touch. There is a blister with blackish discoloration in the posterior aspect close to the heel. Slow oozing is noted of yellowish material. He also has a callus on the plantar aspect. Neurologic: Alert and oriented 3. No focal neurological deficits are present.  Lab Results:  Basic Metabolic Panel:  Recent Labs Lab 08/28/14 0251 08/29/14 0453  NA 135 140  K 4.1 3.9  CL 101 105  CO2 25 28  GLUCOSE 420* 194*  BUN 22 24*  CREATININE 1.24 1.33  CALCIUM 8.9 8.9   Liver Function Tests:  Recent Labs Lab 08/28/14 0251 08/29/14 0453  AST 16 14  ALT 14 12  ALKPHOS 54 46  BILITOT 0.8 0.8  PROT 7.7 7.3  ALBUMIN 3.4* 3.1*   CBC:  Recent Labs Lab 08/28/14 0251 08/29/14 0453  WBC 14.6* 12.8*  NEUTROABS 12.5* 9.8*  HGB 10.1* 9.4*  HCT 31.7* 31.2*  MCV 81.5 83.9  PLT 209 201   BNP (last 3 results)  Recent Labs  08/31/13 1333  PROBNP 312.0*   CBG:  Recent Labs  Lab 08/28/14 0645 08/28/14 1141 08/28/14 1708 08/28/14 2229 08/29/14 0753  GLUCAP 372* 334* 258* 212* 200*    Studies/Results: Dg Ankle Complete Left  08/28/2014   CLINICAL DATA:  Ankle pain.  Blood blister on the posterior ankle.  EXAM: LEFT ANKLE COMPLETE - 3+ VIEW  COMPARISON:  Foot radiograph 12/20/2007  FINDINGS: There is no evidence of fracture, dislocation, or joint effusion. No significant or progressive arthropathy from 2009. Contour abnormality of the skin over the Achilles correlates with history of a "blood blister" . No subcutaneous gas or radiopaque foreign body.  IMPRESSION: No acute osseous findings.   Electronically Signed   By: Jonathan  Watts M.D.   On: 08/28/2014 02:20    Medications:  Scheduled: . apixaban  5 mg Oral BID  . diltiazem  360 mg Oral Daily  . escitalopram   10 mg Oral Daily  . febuxostat  40 mg Oral Daily  . insulin aspart  0-20 Units Subcutaneous TID WC  . insulin aspart  0-5 Units Subcutaneous QHS  . pantoprazole  40 mg Oral Daily  . piperacillin-tazobactam (ZOSYN)  IV  3.375 g Intravenous 3 times per day  . potassium chloride SA  20 mEq Oral Daily  . simvastatin  20 mg Oral q1800  . sodium chloride  3 mL Intravenous Q12H  . tamsulosin  0.4 mg Oral QPC breakfast  . vancomycin  1,000 mg Intravenous Q12H   Continuous:   PRN:acetaminophen **OR** acetaminophen, hydrALAZINE, HYDROcodone-acetaminophen, ondansetron **OR** ondansetron (ZOFRAN) IV  Assessment/Plan:  Principal Problem:   Cellulitis Active Problems:   Atrial fibrillation with RVR   Gout   Diabetes mellitus type 2, uncontrolled, with complications   CKD (chronic kidney disease), stage II   Hypertension   Anemia   CVA (cerebral infarction)   Chronic diastolic heart failure   Sepsis    Cellulitis of left ankle versus Achilles tendinitis His presentation is more concerning for cellulitis. He did have reasonably good range of motion of the left ankle. Continue with antibiotics for now. X-ray did not show any skeletal abnormalities. ESR is elevated. Blood cultures are pending. PT and OT. No DVT noted. Pain medications as needed. The area in the posterior aspect of the foot is draining slowly. Local wound care. If he is unable to bear weight on that ankle despite improvement in cellulitis, consideration should be given to additional imaging studies.  A. fib Heart rate is better controlled. Continue with home medications. Also continue Eliquis.  History of diabetes mellitus type 2 Blood sugar remains significantly elevated. He is currently only on sliding scale coverage. He'll initiate long-acting insulin. He is just on oral medicines at home with the NovoLog with meals. HbA1c is 9.1, implying very poor control at home. Might need to go home with insulin.  History of  gout. Continue Uloric. His current presentation does not appear to be due to acute gout.   GERD Continue Protonix.  Essential hypertension Blood pressure is now better. Continue home medications.   Normocytic anemia Drop in hemoglobin noted. No overt bleeding. Possibly dilutional. Recheck in the morning.  DVT Prophylaxis: On anticoagulation    Code Status: Full code  Family Communication: Discussed with the patient  Disposition Plan: Not ready for discharge. Continue to mobilize.    LOS: 2 days   ,  [facility] Pager [phone] 08/29/2014, 8:10 AM  If 7PM-7AM, please contact night-coverage at [email or web address], password [email or web address]   

## 2014-08-29 NOTE — Consult Note (Addendum)
WOC wound consult note Reason for Consult: Consult requested for left posterior leg wound. Wound type: Full thickness to ankle/achilies posterior area of left leg. Measurement: 3X3X.2cm Wound bed: Previous blister has ruptured and drained on the bed. Wound bed is 50% red, 50% dark purple old dried blood tightly adhered to upper wound bed. Drainage (amount, consistency, odor) Mod amt pink drainage, no odor Periwound: Generalized erythremia and edema to left leg surrounding wound. Dressing procedure/placement/frequency: Old blood area is high risk to possibly evolve into eschar.  Recommend follow-up at the outpatient wound care center after discharge if wound appearance has not improved at that time.  This can be arranged by physician referral.  Bactroban for antimicrobial benefits and to promote moist healing. Discussed plan of care with patient and the importance of avoiding pressure to this site, he declines offer to use a pillow R/T discomfort but he verbalizes understanding of offloading affected area with repositioning. Please re-consult if further assistance is needed.  Thank-you,  Julien Girt MSN, [place], [place], [place], [place]

## 2014-08-30 ENCOUNTER — Inpatient Hospital Stay (HOSPITAL_COMMUNITY): Payer: Medicare Other

## 2014-08-30 LAB — CBC
HCT: 31.1 % — ABNORMAL LOW (ref 39.0–52.0)
Hemoglobin: 9.1 g/dL — ABNORMAL LOW (ref 13.0–17.0)
MCH: 24.6 pg — ABNORMAL LOW (ref 26.0–34.0)
MCHC: 29.3 g/dL — AB (ref 30.0–36.0)
MCV: 84.1 fL (ref 78.0–100.0)
Platelets: 252 10*3/uL (ref 150–400)
RBC: 3.7 MIL/uL — ABNORMAL LOW (ref 4.22–5.81)
RDW: 16.5 % — AB (ref 11.5–15.5)
WBC: 11.4 10*3/uL — ABNORMAL HIGH (ref 4.0–10.5)

## 2014-08-30 LAB — BASIC METABOLIC PANEL
Anion gap: 7 (ref 5–15)
BUN: 22 mg/dL (ref 6–23)
CO2: 25 mmol/L (ref 19–32)
CREATININE: 1.3 mg/dL (ref 0.50–1.35)
Calcium: 8.6 mg/dL (ref 8.4–10.5)
Chloride: 104 mEq/L (ref 96–112)
GFR calc non Af Amer: 54 mL/min — ABNORMAL LOW (ref >90)
GFR, EST AFRICAN AMERICAN: 63 mL/min — AB (ref >90)
Glucose, Bld: 226 mg/dL — ABNORMAL HIGH (ref 70–99)
Potassium: 4 mmol/L (ref 3.5–5.1)
Sodium: 136 mmol/L (ref 135–145)

## 2014-08-30 LAB — GLUCOSE, CAPILLARY
Glucose-Capillary: 198 mg/dL — ABNORMAL HIGH (ref 70–99)
Glucose-Capillary: 224 mg/dL — ABNORMAL HIGH (ref 70–99)
Glucose-Capillary: 280 mg/dL — ABNORMAL HIGH (ref 70–99)
Glucose-Capillary: 257 mg/dL — ABNORMAL HIGH (ref 70–99)

## 2014-08-30 MED ORDER — FUROSEMIDE 40 MG PO TABS
40.0000 mg | ORAL_TABLET | Freq: Every day | ORAL | Status: DC
  Administered 2014-08-30 – 2014-09-03 (×5): 40 mg via ORAL
  Filled 2014-08-30 (×5): qty 1

## 2014-08-30 MED ORDER — DIAZEPAM 5 MG PO TABS
5.0000 mg | ORAL_TABLET | Freq: Once | ORAL | Status: AC
  Administered 2014-08-30: 5 mg via ORAL
  Filled 2014-08-30 (×2): qty 1

## 2014-08-30 NOTE — Progress Notes (Addendum)
Patient ID: Oscar [name], male   DOB: 16-Dec-1945, 69 y.o.   MRN: [id number]  [facility] PROGRESS NOTE  Oscar [name]:[id number] DOB: Jul 07, 1946 DOA: 08/27/2014 PCP: Henrine Screws, MD   Brief narrative:    69 year old male with a history of diabetes, hypertension, atrial fibrillation on anticoagulation, presented with pain in the left ankle area. He was febrile and tachycardic. He was also noted to have elevated WBC. He was admitted for further management of cellulitis.  Assessment/Plan:    Principal Problem:   Sepsis secondary to cellulitis, left ankle wound  - still with draining wound, 3x3x1 cm, wound bed 50% red, 50% dark purple with old dried blood, pink drainage - MRI left ankle requested to rule out osteo - continue Vancomycin and Zosyn day # 3 - continue analgesia as needed  - WBC is trending down, repeat CBC in AM - follow up blood cultures Active Problems:   Atrial fibrillation with RVR - rate controlled, continue Cardizem and Apixaban    Diabetes mellitus type 2, uncontrolled, with complications of PVD, CKD stage II  - continue Lantus and SSI    CKD (chronic kidney disease), stage II (based on GFR)  - Cr remains stable and at baseline    Hypertension - reasonable inpatient control    Anemia of chronic disease, CKD, DM - no signs of active bleeding - repeat CBC in AM   Chronic diastolic heart failure - no signs of volume overload except LE edema but weight is up since admission: 287 lbs --> 297 lbs this AM - stop IVF  - daily weights, strict I's and O's  - resume home dose Lasix 40 mg PO QD    Morbid obesity, BMI > 40   Gout  - continue Uloric   Code Status: Full.  Family Communication:  plan of care discussed with the patient and family at bedside  Disposition Plan: Home when stable.   IV access:  Peripheral IV Procedures and diagnostic studies:    Dg Ankle Left 08/28/2014    No acute osseous findings.  Medical Consultants:  None  Other  Consultants:  Wound care team  IAnti-Infectives:   Vancomycin 1/18 --> Zosyn 1/18 -->  [name], MD  [facility] Pager [phone]  If 7PM-7AM, please contact night-coverage [email or web address] Password [email or web address] 08/30/2014, 4:11 PM   LOS: 3 days   HPI/Subjective: No events overnight.   Objective: Filed Vitals:   08/29/14 2133 08/30/14 0604 08/30/14 1114 08/30/14 1404  BP: 163/90 144/74 151/69 118/73  Pulse: 111 100 108 98  Temp: 98.6 F (37 C) 98.6 F (37 C)  98.6 F (37 C)  TempSrc: Oral Oral  Oral  Resp: 18 20  18   Height:      Weight:  134.8 kg (297 lb 2.9 oz)    SpO2: 95% 94%  90%    Intake/Output Summary (Last 24 hours) at 08/30/14 1611 Last data filed at 08/30/14 1458  Gross per 24 hour  Intake    670 ml  Output   1050 ml  Net   -380 ml    Exam:   General:  Pt is alert, follows commands appropriately, not in acute distress  Cardiovascular: Regular rate and rhythm,  no rubs, no gallops  Respiratory: Clear to auscultation bilaterally, no wheezing, no crackles, no rhonchi  Abdomen: Soft, non tender, non distended, bowel sounds present, no guarding  Extremities: LLE edema with 3x3 open wound with pink drainage in the left ankle area  Data Reviewed: Basic Metabolic Panel:  Recent Labs Lab 08/28/14 0251 08/29/14 0453 08/30/14 0419  NA 135 140 136  K 4.1 3.9 4.0  CL 101 105 104  CO2 25 28 25   GLUCOSE 420* 194* 226*  BUN 22 24* 22  CREATININE 1.24 1.33 1.30  CALCIUM 8.9 8.9 8.6   Liver Function Tests:  Recent Labs Lab 08/28/14 0251 08/29/14 0453  AST 16 14  ALT 14 12  ALKPHOS 54 46  BILITOT 0.8 0.8  PROT 7.7 7.3  ALBUMIN 3.4* 3.1*   CBC:  Recent Labs Lab 08/28/14 0251 08/29/14 0453 08/30/14 0419  WBC 14.6* 12.8* 11.4*  NEUTROABS 12.5* 9.8*  --   HGB 10.1* 9.4* 9.1*  HCT 31.7* 31.2* 31.1*  MCV 81.5 83.9 84.1  PLT 209 201 252   CBG:  Recent Labs Lab 08/29/14 1153 08/29/14 1743 08/29/14 2136 08/30/14 0736 08/30/14 1135   GLUCAP 247* 269* 241* 198* 224*    Recent Results (from the past 240 hour(s))  Culture, blood (routine x 2)     Status: None (Preliminary result)   Collection Time: 08/28/14  3:54 AM  Result Value Ref Range Status   Specimen Description BLOOD L FOREARM  Final   Special Requests BOTTLES DRAWN AEROBIC AND ANAEROBIC 2.5ML  Final   Culture   Final           BLOOD CULTURE RECEIVED NO GROWTH TO DATE CULTURE WILL BE HELD FOR 5 DAYS BEFORE ISSUING A FINAL NEGATIVE REPORT Note: Culture results may be compromised due to an inadequate volume of blood received in culture bottles. Performed at [facility]    Report Status PENDING  Incomplete  Culture, blood (routine x 2)     Status: None (Preliminary result)   Collection Time: 08/28/14  3:54 AM  Result Value Ref Range Status   Specimen Description BLOOD R HAND  Final   Special Requests BOTTLES DRAWN AEROBIC AND ANAEROBIC 4CC  Final   Culture   Final           BLOOD CULTURE RECEIVED NO GROWTH TO DATE CULTURE WILL BE HELD FOR 5 DAYS BEFORE ISSUING A FINAL NEGATIVE REPORT Performed at [facility]    Report Status PENDING  Incomplete     Scheduled Meds: . apixaban  5 mg Oral BID  . diazepam  5 mg Oral Once  . diltiazem  360 mg Oral Daily  . escitalopram  10 mg Oral Daily  . febuxostat  40 mg Oral Daily  . insulin aspart  0-20 Units Subcutaneous TID WC  . insulin aspart  0-5 Units Subcutaneous QHS  . insulin glargine  20 Units Subcutaneous Daily  . pantoprazole  40 mg Oral Daily  . ZOSYN  IV  3.375 g Intravenous 3 times per day  . potassium chloride SA  20 mEq Oral Daily  . simvastatin  20 mg Oral q1800  . tamsulosin  0.4 mg Oral QPC breakfast  . vancomycin  1,000 mg Intravenous Q12H   Continuous Infusions:

## 2014-08-30 NOTE — Progress Notes (Signed)
Per pt's wife, pt does not require any assistance with home CPAP machine or mask application. Pt's wife was informed to have pt contact RT thoughout the night for assistance if needed. RT will continue to monitor as needed.

## 2014-08-30 NOTE — Progress Notes (Signed)
Inpatient Diabetes Program Recommendations  AACE/ADA: New Consensus Statement on Inpatient Glycemic Control (2013)  Target Ranges:  Prepandial:   less than 140 mg/dL      Peak postprandial:   less than 180 mg/dL (1-2 hours)      Critically ill patients:  140 - 180 mg/dL    Results for Oscar [name], Oscar [name] (MRN [id number]) as of 08/30/2014 14:18  Ref. Range 08/29/2014 07:53 08/29/2014 11:53 08/29/2014 17:43 08/29/2014 21:36  Glucose-Capillary Latest Range: 70-99 mg/dL 200 (H) 247 (H) 269 (H) 241 (H)    Results for Oscar [name] (MRN [id number]) as of 08/30/2014 14:18  Ref. Range 08/30/2014 07:36 08/30/2014 11:35  Glucose-Capillary Latest Range: 70-99 mg/dL 198 (H) 224 (H)     Admitted with Cellulitis LE. History of DM, HTN.   Home DM Meds: Glyburide 5 mg bid  Metformin 1000 mg bid  Novolog SSI prn   Current Orders: Novolog Resistant SSI tid ac + HS  Lantus 20 units daily (started yesterday at 12PM)   **Glucose levels significantly elevated.   **A1c shows poor control at home. Needs follow-up with his PCP/Endocrinologist for better blood glucose management.   Spoke with patient yesterday about his current A1c of 9.1%. Explained what an A1c is and what it measures. Reminded patient that his goal A1c is 7% or less per ADA standards to prevent both acute and long-term complications. Explained to patient the extreme importance of good glucose control at home especially in the setting of infection (good glucose control needed to heal his LE).  Patient told me he started seeing an Endocrinologist about a year ago (cannot remember the MDs name but knows the Endocrinology office is off of [facility]. Could possibly be Dr. Buddy Duty with [facility]?). Has seen the Endocrinologist twice and needs to make a follow-up appointment soon with the Endocrinology office. Patient told me he has been using Novolog SSI prn at home for  high blood sugars. Only takes the Novolog when his blood sugars are really high (300s or so per patient). Discussed with patient that the MD managing his care here in the hospital may start him on another insulin to help better control his blood sugars so that his lower extremity can heal. Patient stated he knows how to give injections and can take additional insulin if needed. Uses vial and syringe at home with his Novolog.  Spoke to patient and his daughter today about the possibility of starting Lantus insulin at home.  Explained what Lantus insulin is and how to take it.  Patient very familiar with insulin injections at home.  Uses Novolog vial and syringe at home prn for elevated CBGs.  Reminded patient that if he goes home on Lantus that he will need to take the Lantus at the same time every day and that this insulin is not to cover food only his basal, non-eating needs.  Advised patient to follow-up with his Endocrinologist for further insulin adjustments and also to call Endo if patient has continuous issues with hyperglycemia or hypoglycemia.   MD- If you discharge patient on basal insulin, please give patient Rx for Lantus vial as patient already knows how to give insulin with vial and syringe  Lantus vial [Order # [id number]] Insulin Syringes 0.5 ml [Order # [id number]]   Will follow Wyn Quaker RN, MSN, CDE Diabetes Coordinator Inpatient Diabetes Program Team Pager: [phone] (8a-10p)

## 2014-08-30 NOTE — Progress Notes (Addendum)
Physical Therapy Treatment Patient Details Name: Oscar [name] MRN: [id number] DOB: [birth year] Today's Date: 08/30/2014    History of Present Illness 69 year old male with a history of diabetes, hypertension, atrial fibrillation on anticoagulation, presented with pain in the left ankle area and admitted for further management of cellulitis.    PT Comments    Pt not really progressing with mobility. Continues to have significant pain and difficulty with ambulation even with use of walker-pain rated as 10/10 with [place] activities. Pt scheduled to have MRI at some point today.   Follow Up Recommendations  Home health PT (depending on progress); 24 hour supervision     Equipment Recommendations  Rolling walker with 5" wheels;Wheelchair (measurements PT);Wheelchair cushion (measurements PT) (wide RW)    Recommendations for Other Services       Precautions / Restrictions Precautions Precautions: Fall Restrictions Weight Bearing Restrictions: No    Mobility  Bed Mobility Overal bed mobility: Needs Assistance Bed Mobility: Supine to Sit     Supine to sit: Min assist     General bed mobility comments: Daughter gave pt 1 handheld assist for trunk to full upright  Transfers Overall transfer level: Needs assistance Equipment used: Rolling walker (2 wheeled) Transfers: Sit to/from Stand Sit to Stand: From elevated surface;Min assist         General transfer comment: Rocking + momentum + assist to get to standing. VCs safety, technique, hand placement. Pt pulled up on walker despite cueing  Ambulation/Gait Ambulation/Gait assistance: Min guard Ambulation Distance (Feet): 30 Feet Assistive device: Rolling walker (2 wheeled) Gait Pattern/deviations: Wide base of support;Antalgic;Trunk flexed;Step-to pattern;Decreased stance time - left     General Gait Details: wide BOS, L foot external rotation noted. Poor use of UEs to aid with WBing. Pt reports pain increased to 10/10  with mobility limiting distance. 2-3 brief standing rest breaks needed due to pain, fatigue.    Stairs            Wheelchair Mobility    Modified Rankin (Stroke Patients Only)       Balance                                    Cognition Arousal/Alertness: Awake/alert Behavior During Therapy: WFL for tasks assessed/performed Overall Cognitive Status: Within Functional Limits for tasks assessed                      Exercises      General Comments        Pertinent Vitals/Pain Pain Assessment: 0-10 Pain Score: 10-Worst pain ever Pain Location: L foot/ankle with WBing Pain Descriptors / Indicators: Aching;Sharp Pain Intervention(s): Monitored during session;Limited activity within patient's tolerance    Home Living                      Prior Function            PT Goals (current goals can now be found in the care plan section) Progress towards PT goals: Not progressing toward goals - comment (limited by pain)    Frequency  Min 3X/week    PT Plan Current plan remains appropriate    Co-evaluation             End of Session   Activity Tolerance: Patient limited by fatigue;Patient limited by pain Patient left: in bed;with call bell/phone within reach;with family/visitor present  Time: 1130-1145 PT Time Calculation (min) (ACUTE ONLY): 15 min  Charges:  $Gait Training: 8-22 mins                    G Codes:      Weston Anna, MPT Pager: [phone]

## 2014-08-31 LAB — BASIC METABOLIC PANEL
ANION GAP: 11 (ref 5–15)
BUN: 20 mg/dL (ref 6–23)
CALCIUM: 8.1 mg/dL — AB (ref 8.4–10.5)
CHLORIDE: 97 meq/L (ref 96–112)
CO2: 26 mmol/L (ref 19–32)
Creatinine, Ser: 1.26 mg/dL (ref 0.50–1.35)
GFR calc Af Amer: 65 mL/min — ABNORMAL LOW (ref >90)
GFR, EST NON AFRICAN AMERICAN: 57 mL/min — AB (ref >90)
Glucose, Bld: 242 mg/dL — ABNORMAL HIGH (ref 70–99)
POTASSIUM: 3.5 mmol/L (ref 3.5–5.1)
SODIUM: 134 mmol/L — AB (ref 135–145)

## 2014-08-31 LAB — CBC
HCT: 30 % — ABNORMAL LOW (ref 39.0–52.0)
Hemoglobin: 9 g/dL — ABNORMAL LOW (ref 13.0–17.0)
MCH: 24.9 pg — AB (ref 26.0–34.0)
MCHC: 30 g/dL (ref 30.0–36.0)
MCV: 83.1 fL (ref 78.0–100.0)
Platelets: 219 10*3/uL (ref 150–400)
RBC: 3.61 MIL/uL — ABNORMAL LOW (ref 4.22–5.81)
RDW: 16.1 % — ABNORMAL HIGH (ref 11.5–15.5)
WBC: 10 10*3/uL (ref 4.0–10.5)

## 2014-08-31 LAB — GLUCOSE, CAPILLARY
GLUCOSE-CAPILLARY: 275 mg/dL — AB (ref 70–99)
GLUCOSE-CAPILLARY: 239 mg/dL — AB (ref 70–99)
GLUCOSE-CAPILLARY: 236 mg/dL — AB (ref 70–99)
Glucose-Capillary: 247 mg/dL — ABNORMAL HIGH (ref 70–99)

## 2014-08-31 LAB — URIC ACID: Uric Acid, Serum: 3 mg/dL — ABNORMAL LOW (ref 4.0–7.8)

## 2014-08-31 MED ORDER — INSULIN GLARGINE 100 UNIT/ML ~~LOC~~ SOLN
30.0000 [IU] | Freq: Every day | SUBCUTANEOUS | Status: DC
  Administered 2014-09-01 – 2014-09-03 (×3): 30 [IU] via SUBCUTANEOUS
  Filled 2014-08-31 (×3): qty 0.3

## 2014-08-31 MED ORDER — COLLAGENASE 250 UNIT/GM EX OINT
TOPICAL_OINTMENT | Freq: Every day | CUTANEOUS | Status: DC
  Administered 2014-08-31 – 2014-09-02 (×3): via TOPICAL
  Administered 2014-09-03: 1 via TOPICAL
  Filled 2014-08-31 (×2): qty 30

## 2014-08-31 MED ORDER — COLLAGENASE 250 UNIT/GM EX OINT: 1.0000 "application " | TOPICAL_OINTMENT | Freq: Every day | CUTANEOUS | Status: DC

## 2014-08-31 NOTE — Progress Notes (Signed)
Inpatient Diabetes Program Recommendations  AACE/ADA: New Consensus Statement on Inpatient Glycemic Control (2013)  Target Ranges:  Prepandial:   less than 140 mg/dL      Peak postprandial:   less than 180 mg/dL (1-2 hours)      Critically ill patients:  140 - 180 mg/dL     Results for Oscar [name], Oscar [name] (MRN [id number]) as of 08/31/2014 09:35  Ref. Range 08/30/2014 07:36 08/30/2014 11:35 08/30/2014 16:53 08/30/2014 20:57  Glucose-Capillary Latest Range: 70-99 mg/dL 198 (H) 224 (H) 280 (H) 257 (H)     MD- Please consider the following insulin adjustments:  1. Increase Lantus to 26 units daily (0.2 units/kg dosing) 2. Add Novolog Meal Coverage- Novolog 6 units tid with meals    MD- If you discharge patient on basal insulin, please give patient Rx for Lantus vial as patient already knows how to give insulin with vial and syringe  Lantus vial [Order # [id number]] Insulin Syringes 0.5 ml [Order # [id number]]    Will follow Wyn Quaker RN, MSN, CDE Diabetes Coordinator Inpatient Diabetes Program Team Pager: [phone] (8a-10p)

## 2014-08-31 NOTE — Progress Notes (Signed)
ANTIBIOTIC CONSULT NOTE - INITIAL  Pharmacy Consult for vancomycin/zosyn Indication: cellulitis  Allergies  Allergen Reactions  . Iohexol      Desc: HIVES S/P 13HR.PREMEDS,PT WEIGHS 367LBS,?LOW DOSAGE PREMEDS   . Ivp Dye [Iodinated Diagnostic Agents]     welps     Patient Measurements: Height: 5\' 9"  (175.3 cm) Weight: 293 lb 3.4 oz (133 kg) IBW/kg (Calculated) : 70.7 Adjusted Body Weight:   Vital Signs: Temp: Oscar.8 F (36.6 C) (01/21 0553) Temp Source: Oral (01/21 0553) BP: 136/87 mmHg (01/21 1028) Pulse Rate: 68 (01/21 0553) Intake/Output from previous day: 01/20 0701 - 01/21 0700 In: 670 [P.O.:120; IV Piggyback:550] Out: 2200 [Urine:2200] Intake/Output from this shift:    Labs:  Recent Labs  08/29/14 0453 08/30/14 0419 08/31/14 0447  WBC 12.8* 11.4* 10.0  HGB 9.4* 9.1* 9.0*  PLT 201 252 219  CREATININE 1.33 1.30 1.26   Estimated Creatinine Clearance: 74.8 mL/min (by C-G formula based on Cr of 1.26). No results for input(s): VANCOTROUGH, VANCOPEAK, VANCORANDOM, GENTTROUGH, GENTPEAK, GENTRANDOM, TOBRATROUGH, TOBRAPEAK, TOBRARND, AMIKACINPEAK, AMIKACINTROU, AMIKACIN in the last 72 hours.   Microbiology: Recent Results (from the past 720 hour(s))  Culture, blood (routine x 2)     Status: None (Preliminary result)   Collection Time: 08/28/14  3:54 AM  Result Value Ref Range Status   Specimen Description BLOOD L FOREARM  Final   Special Requests BOTTLES DRAWN AEROBIC AND ANAEROBIC 2.5ML  Final   Culture   Final           BLOOD CULTURE RECEIVED NO GROWTH TO DATE CULTURE WILL BE HELD FOR 5 DAYS BEFORE ISSUING A FINAL NEGATIVE REPORT Note: Culture results may be compromised due to an inadequate volume of blood received in culture bottles. Performed at [facility]    Report Status PENDING  Incomplete  Culture, blood (routine x 2)     Status: None (Preliminary result)   Collection Time: 08/28/14  3:54 AM  Result Value Ref Range Status   Specimen  Description BLOOD R HAND  Final   Special Requests BOTTLES DRAWN AEROBIC AND ANAEROBIC 4CC  Final   Culture   Final           BLOOD CULTURE RECEIVED NO GROWTH TO DATE CULTURE WILL BE HELD FOR 5 DAYS BEFORE ISSUING A FINAL NEGATIVE REPORT Performed at [facility]    Report Status PENDING  Incomplete    Assessment: Oscar [name] presents with leg pain and swelling. Broad spectrum antibiotics started for possible sepsis (fever, tachycardia [although has afib], leukocytosis) with cellulitis of L leg (vs achilles tendinitis). Has chronic non-healing ulcer on bottom of L foot (no reports that it looks infected). Pharmacy asked to dose vancomycin and zosyn. Vancomycin 1gm and zosyn given in ED. MRI 1/20 reveals abnormal fluid collection of LLE that is likely abscess or infected fluid, no signs of osteomyelitis  1/18 >>vancomycin >> 1/18 >> pip/tazo >>   Tmax: afeb WBCs: improved to WNL 1/21 Renal: SCr WNL (sl up from admission) with normalized CrCl = 18ml/min  1/18 blood: NGTD  Drug level / dose changes info:   Goal of Therapy:  Vancomycin trough level 10-15 mcg/ml  Plan:  Day #4 vancomycin/zosyn  Continue Vancomycin 1gm IV q12h   Check trough in am  Zosyn 3.375gm IV q8h over 4h infusion  ? Plan to drain leg abscess if able  Doreene Eland, PharmD, BCPS.   Pager: [phone]  08/31/2014,11:00 AM

## 2014-08-31 NOTE — Progress Notes (Signed)
Pt requires not assistance with home CPAP machine.  RT to monitor and assess as needed.

## 2014-08-31 NOTE — Consult Note (Signed)
Reason for Consult: Ulcer left Achilles tendon Referring Physician: Dr. Prentice [name] is an 69 y.o. male.  HPI: Patient is a 69 year old gentleman with diabetic insensate neuropathy history of gout atrial fibrillation and sleep apnea who reports a 1-1/2 week history of acute pain and ulceration over the posterior aspect of the Achilles tendon. Patient denies any previous injuries.  Past Medical History  Diagnosis Date  . Diabetes mellitus without complication     improved after diet/exercise  . Hypertension     improved after diet/exercise  . Kidney stones   . GERD (gastroesophageal reflux disease)   . H/O hiatal hernia   . Arthritis     hips  . OSA (obstructive sleep apnea)     wears cpap  . Anal fissure     h/o - no recent complications  . Atrial fibrillation   . Gout     Past Surgical History  Procedure Laterality Date  . Cholecystectomy    . Knee arthroscopy    . Carpel tunnel    . Ligament repair    . Kidney stone surgery      Family History  Problem Relation Age of Onset  . COPD Father   . Heart attack Sister     died age 64 of MI    Social History:  reports that he has never smoked. He has never used smokeless tobacco. He reports that he drinks alcohol. He reports that he does not use illicit drugs.  Allergies:  Allergies  Allergen Reactions  . Iohexol      Desc: HIVES S/P 13HR.PREMEDS,PT WEIGHS 367LBS,?LOW DOSAGE PREMEDS   . Ivp Dye [Iodinated Diagnostic Agents]     welps     Medications: I have reviewed the patient's current medications.  Results for orders placed or performed during the hospital encounter of 08/27/14 (from the past 48 hour(s))  Glucose, capillary     Status: Abnormal   Collection Time: 08/29/14  9:36 PM  Result Value Ref Range   Glucose-Capillary 241 (H) 70 - 99 mg/dL  CBC     Status: Abnormal   Collection Time: 08/30/14  4:19 AM  Result Value Ref Range   WBC 11.4 (H) 4.0 - 10.5 K/uL   RBC 3.70 (L) 4.22 - 5.81 MIL/uL    Hemoglobin 9.1 (L) 13.0 - 17.0 g/dL   HCT 31.1 (L) 39.0 - 52.0 %   MCV 84.1 78.0 - 100.0 fL   MCH 24.6 (L) 26.0 - 34.0 pg   MCHC 29.3 (L) 30.0 - 36.0 g/dL   RDW 16.5 (H) 11.5 - 15.5 %   Platelets 252 150 - 400 K/uL  Basic metabolic panel     Status: Abnormal   Collection Time: 08/30/14  4:19 AM  Result Value Ref Range   Sodium 136 135 - 145 mmol/L    Comment: Please note change in reference range.   Potassium 4.0 3.5 - 5.1 mmol/L    Comment: Please note change in reference range.   Chloride 104 96 - 112 mEq/L   CO2 25 19 - 32 mmol/L   Glucose, Bld 226 (H) 70 - 99 mg/dL   BUN 22 6 - 23 mg/dL   Creatinine, Ser 1.30 0.50 - 1.35 mg/dL   Calcium 8.6 8.4 - 10.5 mg/dL   GFR calc non Af Amer 54 (L) >90 mL/min   GFR calc Af Amer 63 (L) >90 mL/min    Comment: (NOTE) The eGFR has been calculated using the CKD EPI equation.  This calculation has not been validated in all clinical situations. eGFR's persistently <90 mL/min signify possible Chronic Kidney Disease.    Anion gap 7 5 - 15  Glucose, capillary     Status: Abnormal   Collection Time: 08/30/14  7:36 AM  Result Value Ref Range   Glucose-Capillary 198 (H) 70 - 99 mg/dL  Glucose, capillary     Status: Abnormal   Collection Time: 08/30/14 11:35 AM  Result Value Ref Range   Glucose-Capillary 224 (H) 70 - 99 mg/dL  Glucose, capillary     Status: Abnormal   Collection Time: 08/30/14  4:53 PM  Result Value Ref Range   Glucose-Capillary 280 (H) 70 - 99 mg/dL  Glucose, capillary     Status: Abnormal   Collection Time: 08/30/14  8:57 PM  Result Value Ref Range   Glucose-Capillary 257 (H) 70 - 99 mg/dL   Comment 1 Documented in Chart    Comment 2 Notify RN   CBC     Status: Abnormal   Collection Time: 08/31/14  4:47 AM  Result Value Ref Range   WBC 10.0 4.0 - 10.5 K/uL   RBC 3.61 (L) 4.22 - 5.81 MIL/uL   Hemoglobin 9.0 (L) 13.0 - 17.0 g/dL   HCT 30.0 (L) 39.0 - 52.0 %   MCV 83.1 78.0 - 100.0 fL   MCH 24.9 (L) 26.0 - 34.0 pg    MCHC 30.0 30.0 - 36.0 g/dL   RDW 16.1 (H) 11.5 - 15.5 %   Platelets 219 150 - 400 K/uL  Basic metabolic panel     Status: Abnormal   Collection Time: 08/31/14  4:47 AM  Result Value Ref Range   Sodium 134 (L) 135 - 145 mmol/L    Comment: CORRECTED RESULTS CALLED TO: LONG, M. AT 1227 ON 08/31/14 BY HOBBINS, J. CORRECTED ON 01/21 AT 1228: PREVIOUSLY REPORTED AS 129 Please note change in reference range. DELTA CHECK NOTED REPEATED TO VERIFY    Potassium 3.5 3.5 - 5.1 mmol/L    Comment: Please note change in reference range.   Chloride 97 96 - 112 mEq/L    Comment: CORRECTED RESULTS CALLED TO: LONG, M. AT 1227 ON 08/31/14 BY HOBBINS, J. CORRECTED ON 01/21 AT 1228: PREVIOUSLY REPORTED AS 95 DELTA CHECK NOTED REPEATED TO VERIFY    CO2 26 19 - 32 mmol/L   Glucose, Bld 242 (H) 70 - 99 mg/dL   BUN 20 6 - 23 mg/dL   Creatinine, Ser 1.26 0.50 - 1.35 mg/dL   Calcium 8.1 (L) 8.4 - 10.5 mg/dL   GFR calc non Af Amer 57 (L) >90 mL/min   GFR calc Af Amer 65 (L) >90 mL/min    Comment: (NOTE) The eGFR has been calculated using the CKD EPI equation. This calculation has not been validated in all clinical situations. eGFR's persistently <90 mL/min signify possible Chronic Kidney Disease. CORRECTED ON 01/21 AT 1228: PREVIOUSLY REPORTED AS 65    Anion gap 11 5 - 15    Comment: CORRECTED ON 01/21 AT 1228: PREVIOUSLY REPORTED AS 8  Glucose, capillary     Status: Abnormal   Collection Time: 08/31/14  8:03 AM  Result Value Ref Range   Glucose-Capillary 247 (H) 70 - 99 mg/dL   Comment 1 Notify RN    Comment 2 Documented in Chart   Glucose, capillary     Status: Abnormal   Collection Time: 08/31/14 12:07 PM  Result Value Ref Range   Glucose-Capillary 275 (H) 70 - 99  mg/dL   Comment 1 Notify RN    Comment 2 Documented in Chart   Glucose, capillary     Status: Abnormal   Collection Time: 08/31/14  5:02 PM  Result Value Ref Range   Glucose-Capillary 239 (H) 70 - 99 mg/dL   Comment 1 Notify RN     Comment 2 Documented in Chart     Mr Ankle Left  Wo Contrast  08/31/2014   CLINICAL DATA:  Left ankle wound.  Sepsis.  Cellulitis.  EXAM: MRI OF THE LEFT ANKLE WITHOUT CONTRAST  TECHNIQUE: Multiplanar, multisequence MR imaging of the ankle was performed. No intravenous contrast was administered.  COMPARISON:  08/28/2014  FINDINGS: Despite efforts by the technologist and patient, motion artifact is present on today's exam and could not be eliminated. This reduces exam sensitivity and specificity.  TENDONS  Peroneal: Minimal peroneus longus tenosynovitis, image 16 series 6.  Posteromedial: Mild tibialis posterior tenosynovitis, image 17 series 3.  Anterior: Unremarkable  Achilles: Mildly increased signal and fusiform expansion of the distal Achilles tendon as on image 14 of series 4 and image 11 of series 3, with abnormal fluid collection extending around the lateral 180 degrees of the tibialis posterior and into adjacent subcutaneous soft tissues and Kager' s fat pad as shown on images 2 through 16 of series 3. Adjacent posterior bandaging noted. The fluid collection extends towards the cutaneous surface in some locations such as on image 12 series 2.  Plantar Fascia: Mild thickening of the medial band of the plantar fascia as on images 9-10 of series 4 and image 13 series 6, with mildly increased internal T2 signal in this vicinity.  LIGAMENTS  Lateral: Poor visualization of the anterior talofibular ligament.  Medial: Attenuation of the tibiospring component of the deltoid ligament on images 19-20 of series 6.  CARTILAGE  Ankle Joint: 0.8 by 0.4 cm focus of subcortical edema in the medial talar dome without disruption of the overlying cortex or cartilage. Image 11, series 3.  Subtalar Joints/Sinus Tarsi: Intact  Bones: Small degenerative focus of edema dorsally in the distal navicular, image 8 series 4.  Other: Diffuse subcutaneous edema in the ankle and tracking along the dorsum of the foot.  IMPRESSION: 1.  Abnormal fluid collection tracking around the lateral 180 degrees of the Achilles tendon and in the adjacent soft tissues, and possibly draining to the skin. Appearance concerning for abscess/infected fluid collection. There is adjacent tendinopathy in the Achilles tendon along with Achilles tendon expansion. The irregular collection measures 3.3 by 1.6 by 5.5 cm. 2. Generalized subcutaneous edema in the ankle and dorsum of the foot, suspicious for cellulitis. No findings of osteomyelitis. 3. Small non fragmented osteochondral lesion along the medial talar dome. 4. Mild peroneus longus and tibialis posterior tenosynovitis. 5. Thickening of the medial band of the plantar fascia focally, possibly T2 fasciitis or plantar fibromatosis. 6. Anterior talofibular ligament insufficiency. 7. Attenuated tibiospring component of the deltoid ligament, query prior injury.   Electronically Signed   By: Sherryl Barters M.D.   On: 08/31/2014 08:21    Review of Systems  All other systems reviewed and are negative.  Blood pressure 115/85, pulse 90, temperature 98.1 F (36.7 C), temperature source Oral, resp. rate 16, height _0  (1.753 m), weight 133 kg (293 lb 3.4 oz), SpO2 95 %. Physical Exam  On examination patient has a good dorsalis pedis pulse. His foot is plantigrade no Charcot arthropathy. He has callus beneath the first metatarsal head of the great  toe. Patient has no brawny skin color changes no signs of any chronic venous insufficiency. No signs of arterial insufficiency. Patient has a wound over the posterior aspect of the Achilles which is approximately 3 cm in diameter the outside 50% has good beefy granulation tissue the center part of the ulcer has a black eschar. MRI scan is reviewed which shows fluid around the tendon. No signs of osteomyelitis. No signs of abscess. There is clear drainage but no purulence. Patient does not have a uric acid level drawn. Assessment/Plan: Assessment: Acute ulcer posterior  left Achilles possibly related to gout combined with pressure from his shoewear.  Plan: I ordered a uric acid level his Uloric may need to be adjusted.  I feel the wound can be treated as an outpatient conservatively at first. I will order Santyl dressing changes daily he will need to continue the Santyl dressing changes when he is discharged with washing his leg with dial soap and water and using a dry dressing with Santyl ointment over the wound. I will order a PR AFO that he is to wear 24 hours a day. I will follow-up in the office in 2 weeks. Recommend oral antibiotic for discharge.  Oscar [name],Oscar [name] 08/31/2014, 6:18 PM

## 2014-08-31 NOTE — Progress Notes (Signed)
Patient ID: Oscar [name], male   DOB: [birth year], 69 y.o.   MRN: [id number] [facility] PROGRESS NOTE  Oscar [name] WUJ:[id number] DOB: 05-14-1946 DOA: 08/27/2014 PCP: Henrine Screws, MD  Brief narrative:    69 year old male with a history of diabetes, hypertension, atrial fibrillation on anticoagulation, presented with pain in the left ankle area. He was febrile and tachycardic. He was also noted to have elevated WBC. He was admitted for further management of cellulitis.  Assessment/Plan:    Principal Problem:  Sepsis secondary to cellulitis, left ankle wound, in pt with diabetes  - still with draining wound, 3x3x1 cm, wound bed 50% red, 50% dark purple with old dried blood, pink drainage - MRI left ankle with fluid collection, pt tender on exam, limited exam but appears that pocket of fluid in close proximity to actual wound - continue Vancomycin and Zosyn day # 4, better sugar control to facilitate healing  - continue analgesia as needed  - WBC is trending down, repeat CBC in AM - ortho consulted  Active Problems:  Atrial fibrillation with RVR - rate controlled, continue Cardizem and Apixaban   Diabetes mellitus type 2, uncontrolled, with complications of PVD, CKD stage II  - continue Lantus and SSI  - increase the dose of Lantus from 20 U to 30 U to improve CBG control   CKD (chronic kidney disease), stage II (based on GFR)  - Cr remains stable and at baseline   Hypertension - reasonable inpatient control   Anemia of chronic disease, CKD, DM - no signs of active bleeding - repeat CBC in AM  Chronic diastolic heart failure - no signs of volume overload except LE edema but weight is up since admission: 287 lbs --> 297 lbs --> 293 lbs this AM - daily weights, strict I's and O's  - resumed home dose Lasix 40 mg PO QD   Morbid obesity, BMI > 40  Gout  - continue Uloric   Code Status: Full.  Family Communication: plan of care discussed with the  patient and family at bedside  Disposition Plan: Home when stable.   IV access:  Peripheral IV Procedures and diagnostic studies:   Dg Ankle Left 08/28/2014 No acute osseous findings.   Mr Ankle Left  Wo Contrast  08/31/2014  Abnormal fluid collection tracking around the lateral 180 degrees of the Achilles tendon and in the adjacent soft tissues, and possibly draining to the skin. Appearance concerning for abscess/infected fluid collection. There is adjacent tendinopathy in the Achilles tendon along with Achilles tendon expansion. The irregular collection measures 3.3 by 1.6 by 5.5 cm. 2. Generalized subcutaneous edema in the ankle and dorsum of the foot, suspicious for cellulitis. No findings of osteomyelitis. 3. Small non fragmented osteochondral lesion along the medial talar dome. 4. Mild peroneus longus and tibialis posterior tenosynovitis. 5. Thickening of the medial band of the plantar fascia focally, possibly T2 fasciitis or plantar fibromatosis. 6. Anterior talofibular ligament insufficiency. 7. Attenuated tibiospring component of the deltoid ligament, query prior injury.     Medical Consultants:  Ortho Other Consultants:  Wound care team  IAnti-Infectives:   Vancomycin 1/18 --> Zosyn 1/18 -->  [name], MD  [facility] Pager [phone]  If 7PM-7AM, please contact night-coverage [email or web address] Password [email or web address] 08/31/2014, 11:04 AM   LOS: 4 days   HPI/Subjective: No events overnight.   Objective: Filed Vitals:   08/30/14 1404 08/30/14 1956 08/31/14 0553 08/31/14 1028  BP: 118/73 127/78 149/81 136/87  Pulse: 98 97  68   Temp: 98.6 F (37 C) 98.7 F (37.1 C) 97.8 F (36.6 C)   TempSrc: Oral Oral Oral   Resp: 18 20 20    Height:      Weight:   133 kg (293 lb 3.4 oz)   SpO2: 90% 96% 98%     Intake/Output Summary (Last 24 hours) at 08/31/14 1104 Last data filed at 08/31/14 0553  Gross per 24 hour  Intake    670 ml  Output   1550 ml  Net   -880 ml     Exam:   General:  Pt is alert, follows commands appropriately, not in acute distress  Cardiovascular: Regular rate and rhythm, S1/S2, no murmurs, no rubs, no gallops  Respiratory: Clear to auscultation bilaterally, no wheezing, no crackles, no rhonchi  Abdomen: Soft, non tender, non distended, bowel sounds present, no guarding  Extremities: left ankle erythema and edema with TTP, open wound 3x3 cm  Data Reviewed: Basic Metabolic Panel:  Recent Labs Lab 08/28/14 0251 08/29/14 0453 08/30/14 0419 08/31/14 0447  NA 135 140 136 129*  K 4.1 3.9 4.0 3.5  CL 101 105 104 95*  CO2 25 28 25 26   GLUCOSE 420* 194* 226* 242*  BUN 22 24* 22 20  CREATININE 1.24 1.33 1.30 1.26  CALCIUM 8.9 8.9 8.6 8.1*   Liver Function Tests:  Recent Labs Lab 08/28/14 0251 08/29/14 0453  AST 16 14  ALT 14 12  ALKPHOS 54 46  BILITOT 0.8 0.8  PROT 7.7 7.3  ALBUMIN 3.4* 3.1*   CBC:  Recent Labs Lab 08/28/14 0251 08/29/14 0453 08/30/14 0419 08/31/14 0447  WBC 14.6* 12.8* 11.4* 10.0  NEUTROABS 12.5* 9.8*  --   --   HGB 10.1* 9.4* 9.1* 9.0*  HCT 31.7* 31.2* 31.1* 30.0*  MCV 81.5 83.9 84.1 83.1  PLT 209 201 252 219   CBG:  Recent Labs Lab 08/30/14 0736 08/30/14 1135 08/30/14 1653 08/30/14 2057 08/31/14 0803  GLUCAP 198* 224* 280* 257* 247*    Recent Results (from the past 240 hour(s))  Culture, blood (routine x 2)     Status: None (Preliminary result)   Collection Time: 08/28/14  3:54 AM  Result Value Ref Range Status   Specimen Description BLOOD L FOREARM  Final   Special Requests BOTTLES DRAWN AEROBIC AND ANAEROBIC 2.5ML  Final   Culture   Final           BLOOD CULTURE RECEIVED NO GROWTH TO DATE CULTURE WILL BE HELD FOR 5 DAYS BEFORE ISSUING A FINAL NEGATIVE REPORT Note: Culture results may be compromised due to an inadequate volume of blood received in culture bottles. Performed at [facility]    Report Status PENDING  Incomplete  Culture, blood (routine x  2)     Status: None (Preliminary result)   Collection Time: 08/28/14  3:54 AM  Result Value Ref Range Status   Specimen Description BLOOD R HAND  Final   Special Requests BOTTLES DRAWN AEROBIC AND ANAEROBIC 4CC  Final   Culture   Final           BLOOD CULTURE RECEIVED NO GROWTH TO DATE CULTURE WILL BE HELD FOR 5 DAYS BEFORE ISSUING A FINAL NEGATIVE REPORT Performed at [facility]    Report Status PENDING  Incomplete     Scheduled Meds: . apixaban  5 mg Oral BID  . diltiazem  360 mg Oral Daily  . escitalopram  10 mg Oral Daily  . febuxostat  40 mg Oral Daily  . furosemide  40 mg Oral Daily  . insulin aspart  0-20 Units Subcutaneous TID WC  . insulin aspart  0-5 Units Subcutaneous QHS  . insulin glargine  20 Units Subcutaneous Daily  . mupirocin ointment   Nasal BID  . pantoprazole  40 mg Oral Daily  . piperacillin-tazobactam (ZOSYN)  IV  3.375 g Intravenous 3 times per day  . potassium chloride SA  20 mEq Oral Daily  . simvastatin  20 mg Oral q1800  . sodium chloride  3 mL Intravenous Q12H  . tamsulosin  0.4 mg Oral QPC breakfast  . vancomycin  1,000 mg Intravenous Q12H   Continuous Infusions:

## 2014-09-01 LAB — BASIC METABOLIC PANEL
Anion gap: 9 (ref 5–15)
BUN: 21 mg/dL (ref 6–23)
CO2: 28 mmol/L (ref 19–32)
Calcium: 8.6 mg/dL (ref 8.4–10.5)
Chloride: 101 mEq/L (ref 96–112)
Creatinine, Ser: 1.22 mg/dL (ref 0.50–1.35)
GFR calc Af Amer: 68 mL/min — ABNORMAL LOW (ref >90)
GFR, EST NON AFRICAN AMERICAN: 59 mL/min — AB (ref >90)
GLUCOSE: 225 mg/dL — AB (ref 70–99)
Potassium: 3.7 mmol/L (ref 3.5–5.1)
Sodium: 138 mmol/L (ref 135–145)

## 2014-09-01 LAB — GLUCOSE, CAPILLARY
GLUCOSE-CAPILLARY: 228 mg/dL — AB (ref 70–99)
GLUCOSE-CAPILLARY: 222 mg/dL — AB (ref 70–99)
Glucose-Capillary: 222 mg/dL — ABNORMAL HIGH (ref 70–99)
Glucose-Capillary: 195 mg/dL — ABNORMAL HIGH (ref 70–99)

## 2014-09-01 LAB — CBC
HCT: 30.6 % — ABNORMAL LOW (ref 39.0–52.0)
Hemoglobin: 9.2 g/dL — ABNORMAL LOW (ref 13.0–17.0)
MCH: 25.1 pg — ABNORMAL LOW (ref 26.0–34.0)
MCHC: 30.1 g/dL (ref 30.0–36.0)
MCV: 83.4 fL (ref 78.0–100.0)
PLATELETS: 252 10*3/uL (ref 150–400)
RBC: 3.67 MIL/uL — AB (ref 4.22–5.81)
RDW: 16.1 % — ABNORMAL HIGH (ref 11.5–15.5)
WBC: 8.1 10*3/uL (ref 4.0–10.5)

## 2014-09-01 LAB — VANCOMYCIN, TROUGH: Vancomycin Tr: 14.6 ug/mL (ref 10.0–20.0)

## 2014-09-01 MED ORDER — DOXYCYCLINE HYCLATE 100 MG PO TABS
100.0000 mg | ORAL_TABLET | Freq: Two times a day (BID) | ORAL | Status: DC
  Administered 2014-09-01 – 2014-09-03 (×5): 100 mg via ORAL
  Filled 2014-09-01 (×6): qty 1

## 2014-09-01 MED ORDER — CEPHALEXIN 500 MG PO CAPS
500.0000 mg | ORAL_CAPSULE | Freq: Three times a day (TID) | ORAL | Status: DC
  Administered 2014-09-01 – 2014-09-03 (×7): 500 mg via ORAL
  Filled 2014-09-01 (×9): qty 1

## 2014-09-01 NOTE — Progress Notes (Addendum)
Patient ID: Oscar [name], male   DOB: 11/21/1945, 70 y.o.   MRN: [id number]  [facility] PROGRESS NOTE  Oscar [name] DPO:[id number] DOB: Aug 05, 1946 DOA: 08/27/2014 PCP: Oscar Screws, MD  Brief narrative:    69 year old male with a history of diabetes, hypertension, atrial fibrillation on anticoagulation, presented with pain in the left ankle area. He was febrile and tachycardic. He was also noted to have elevated WBC. He was admitted for further management of cellulitis.  Assessment/Plan:    Principal Problem:  Sepsis secondary to cellulitis, left ankle wound, in pt with diabetes  - left ankle wound, 3x3x1 cm, wound bed 50% red, 50% dark purple with old dried blood, still with pink drainage - MRI left ankle with fluid collection, reviewed by Dr. Sharol Given, no need for drainage  - continue Vancomycin and Zosyn day # 5, transition to oral ABX today 1/22 (chagne to doxycycline and keflex) - if pt tolerating well, possible d/c in AM - continue analgesia as needed  - WBC now WNL, no repeat CBC needed  Active Problems:  Atrial fibrillation with RVR - rate controlled, continue Cardizem and Apixaban  - pt with no chest pain or shortness of breath  - no need for telemetry monitoring, d/c 1/21  Diabetes mellitus type 2, uncontrolled, with complications of PVD, CKD stage II  - continue Lantus and SSI  - increased the dose of Lantus from 20 U to 30 U to improve CBG control (1/21) --> CBG better controlled   CKD (chronic kidney disease), stage II (based on GFR)  - Cr remains stable, WNL  - no need for repeat BMP  Hypertension - reasonable inpatient control  - on Cardizem and lasix   Anemia of chronic disease, CKD, DM - no signs of active bleeding  Chronic diastolic heart failure - no signs of volume overload except LE edema but weight is up since admission: 287 lbs --> 297 lbs --> 293 lbs --> 294 lbs this AM - continue to monitor daily weights, strict I's and O's   - resumed home dose Lasix 40 mg PO QD 1/20, no changes in regimen needed at this time   Morbid obesity, BMI > 40   HLD - continue statin   Gout  - continue Uloric  - uric acid level 3 (1/21)  DVT prophylaxis: pt already on Apixaban for atrial fib   Code Status: Full.  Family Communication: plan of care discussed with the patient and family at bedside  Disposition Plan: Home when stable, possibly in 1-2 days   IV access:  Peripheral IV Procedures and diagnostic studies:   Dg Ankle Left 08/28/2014 No acute osseous findings.   Mr Ankle Left Wo Contrast 08/31/2014 Abnormal fluid collection tracking around the lateral 180 degrees of the Achilles tendon and in the adjacent soft tissues, and possibly draining to the skin. Appearance concerning for abscess/infected fluid collection. There is adjacent tendinopathy in the Achilles tendon along with Achilles tendon expansion. The irregular collection measures 3.3 by 1.6 by 5.5 cm. 2. Generalized subcutaneous edema in the ankle and dorsum of the foot, suspicious for cellulitis. No findings of osteomyelitis. 3. Small non fragmented osteochondral lesion along the medial talar dome. 4. Mild peroneus longus and tibialis posterior tenosynovitis. 5. Thickening of the medial band of the plantar fascia focally, possibly T2 fasciitis or plantar fibromatosis. 6. Anterior talofibular ligament insufficiency. 7. Attenuated tibiospring component of the deltoid ligament, query prior injury.   Medical Consultants:  Ortho - Dr. Sharol Given  Other Consultants:  Wound care team  IAnti-Infectives:   Vancomycin 1/18 --> 1/22 Zosyn 1/18 --> 1/22 Doxycycline 1/22 --> Keflex 1/22 -->  Oscar Ramsay, MD  [facility] Pager [phone]  If 7PM-7AM, please contact night-coverage [email or web address] Password [facility] 09/01/2014, 10:47 AM   LOS: 5 days   HPI/Subjective: No events overnight.   Objective: Filed Vitals:   08/31/14 1416 08/31/14 2222  09/01/14 0627 09/01/14 1032  BP: 115/85 141/77 127/82 136/85  Pulse: 90 91 87   Temp: 98.1 F (36.7 C) 98.7 F (37.1 C) 98.4 F (36.9 C)   TempSrc: Oral Oral Oral   Resp: 16 20 20    Height:      Weight:   133.8 kg (294 lb 15.6 oz)   SpO2: 95% 94% 95%     Intake/Output Summary (Last 24 hours) at 09/01/14 1047 Last data filed at 09/01/14 0545  Gross per 24 hour  Intake   1030 ml  Output   2675 ml  Net  -1645 ml    Exam:   General:  Pt is alert, follows commands appropriately, not in acute distress  Cardiovascular: Regular rate and rhythm, S1/S2, no murmurs, no rubs, no gallops  Respiratory: Clear to auscultation bilaterally, no wheezing, diminished breath sounds at bases   Abdomen: Soft, non tender, non distended, bowel sounds present, no guarding  Extremities: left ankle erythema and edema with TTP, open wound 3x3 cm with mild drainage   Neuro: Grossly nonfocal  Data Reviewed: Basic Metabolic Panel:  Recent Labs Lab 08/28/14 0251 08/29/14 0453 08/30/14 0419 08/31/14 0447 09/01/14 0505  NA 135 140 136 134* 138  K 4.1 3.9 4.0 3.5 3.7  CL 101 105 104 97 101  CO2 25 28 25 26 28   GLUCOSE 420* 194* 226* 242* 225*  BUN 22 24* 22 20 21   CREATININE 1.24 1.33 1.30 1.26 1.22  CALCIUM 8.9 8.9 8.6 8.1* 8.6   Liver Function Tests:  Recent Labs Lab 08/28/14 0251 08/29/14 0453  AST 16 14  ALT 14 12  ALKPHOS 54 46  BILITOT 0.8 0.8  PROT 7.7 7.3  ALBUMIN 3.4* 3.1*   CBC:  Recent Labs Lab 08/28/14 0251 08/29/14 0453 08/30/14 0419 08/31/14 0447 09/01/14 0505  WBC 14.6* 12.8* 11.4* 10.0 8.1  NEUTROABS 12.5* 9.8*  --   --   --   HGB 10.1* 9.4* 9.1* 9.0* 9.2*  HCT 31.7* 31.2* 31.1* 30.0* 30.6*  MCV 81.5 83.9 84.1 83.1 83.4  PLT 209 201 252 219 252   CBG:  Recent Labs Lab 08/31/14 0803 08/31/14 1207 08/31/14 1702 08/31/14 2220 09/01/14 0742  GLUCAP 247* 275* 239* 236* 222*    Recent Results (from the past 240 hour(s))  Culture, blood (routine x  2)     Status: None (Preliminary result)   Collection Time: 08/28/14  3:54 AM  Result Value Ref Range Status   Specimen Description BLOOD L FOREARM  Final   Special Requests BOTTLES DRAWN AEROBIC AND ANAEROBIC 2.5ML  Final   Culture   Final           BLOOD CULTURE RECEIVED NO GROWTH TO DATE CULTURE WILL BE HELD FOR 5 DAYS BEFORE ISSUING A FINAL NEGATIVE REPORT Note: Culture results may be compromised due to an inadequate volume of blood received in culture bottles. Performed at [facility]    Report Status PENDING  Incomplete  Culture, blood (routine x 2)     Status: None (Preliminary result)   Collection Time: 08/28/14  3:54 AM  Result Value Ref Range Status   Specimen Description BLOOD R HAND  Final   Special Requests BOTTLES DRAWN AEROBIC AND ANAEROBIC 4CC  Final   Culture   Final           BLOOD CULTURE RECEIVED NO GROWTH TO DATE CULTURE WILL BE HELD FOR 5 DAYS BEFORE ISSUING A FINAL NEGATIVE REPORT Performed at [facility]    Report Status PENDING  Incomplete     Scheduled Meds: . apixaban  5 mg Oral BID  . collagenase   Topical Daily  . diltiazem  360 mg Oral Daily  . escitalopram  10 mg Oral Daily  . febuxostat  40 mg Oral Daily  . furosemide  40 mg Oral Daily  . insulin aspart  0-20 Units Subcutaneous TID WC  . insulin aspart  0-5 Units Subcutaneous QHS  . insulin glargine  30 Units Subcutaneous Daily  . pantoprazole  40 mg Oral Daily  . piperacillin-tazobactam (ZOSYN)  IV  3.375 g Intravenous 3 times per day  . potassium chloride SA  20 mEq Oral Daily  . simvastatin  20 mg Oral q1800  . sodium chloride  3 mL Intravenous Q12H  . tamsulosin  0.4 mg Oral QPC breakfast  . vancomycin  1,000 mg Intravenous Q12H   Continuous Infusions:

## 2014-09-01 NOTE — Progress Notes (Signed)
Physical Therapy Treatment Patient Details Name: Oscar [name] MRN: [id number] DOB: October 13, 1945 Today's Date: 09/01/2014    History of Present Illness 69 year old male with a history of diabetes, hypertension, atrial fibrillation on anticoagulation, presented with pain in the left ankle area and admitted for further management of cellulitis.    PT Comments    Assisted pt OOB to practice transfers and mobility.  Pt required increased time and much effort to get from supine to sit due to large ABD.  Once EOB pt c/o increased L LE pain "throbbing".  Instructed and practiced how to transfer to pt's R side (ideal) stand pivot sit without RW.  VC's on hand placement and turn completion.  Assisted with sit to stand and amb a limited distance from wc to bed.  Very unsteady gait with limited activity tolerance and limited WB LE due to 10/10 pain.  Only able to amb 5 feet.  Near fall while turning with back steps to bed. Advised to always have help until he gets stronger/better. Pt declines any discussion of SNF and wishes to go home.  Pt has 3 steps to enter home.  "there's no way I can get up those".  Pt agreed to go home via non [facility]erg[facility]y ambulance.  Follow Up Recommendations  Home health PT     Equipment Recommendations  Rolling walker with 5" wheels (pt declined rec for a w/c "won't work in my house")    Recommendations for Other Services       Precautions / Restrictions Precautions Precautions: Fall Precaution Comments: L PF AFO all times Restrictions Weight Bearing Restrictions: No    Mobility  Bed Mobility Overal bed mobility: Modified Independent Bed Mobility: Supine to Sit;Sit to Supine           General bed mobility comments: able to self perform with increased time and use of rail  Transfers Overall transfer level: Needs assistance Equipment used: None;Rolling walker (2 wheeled) Transfers: Sit to/from Stand Sit to Stand: Min assist;Min guard         General  transfer comment: Rocking + momentum + assist to get to standing. VCs safety, technique, hand placement. Pt pulled up on walker despite cueing.  unsteady/limited tolerance due to pain.  Ambulation/Gait Ambulation/Gait assistance: Min assist Ambulation Distance (Feet): 5 Feet Assistive device: Rolling walker (2 wheeled) Gait Pattern/deviations: Wide base of support;Step-to pattern;Decreased stance time - left Gait velocity: decreased   General Gait Details: very unsteady gait with limited tolerance to WB thru L foot.  R sock slippery on floor.  Spouse took shoes and clothing home.  Advised pt to wear hios R shoe for increased safety.  Difficulty turning/near fall when turning and stepping backward to bed.     Stairs            Wheelchair Mobility    Modified Rankin (Stroke Patients Only)       Balance                                    Cognition Arousal/Alertness: Awake/alert Behavior During Therapy: WFL for tasks assessed/performed Overall Cognitive Status: Within Functional Limits for tasks assessed                      Exercises      General Comments        Pertinent Vitals/Pain Pain Assessment: 0-10 Pain Score: 10-Worst pain ever Pain Location: L  foot/ankle with activity Pain Descriptors / Indicators: Aching;Sharp Pain Intervention(s): Monitored during session;Premedicated before session;Repositioned    Home Living                      Prior Function            PT Goals (current goals can now be found in the care plan section) Progress towards PT goals: Progressing toward goals    Frequency  Min 3X/week    PT Plan Current plan remains appropriate    Co-evaluation             End of Session Equipment Utilized During Treatment: Gait belt Activity Tolerance: Patient limited by fatigue;Patient limited by pain Patient left: in bed;with call bell/phone within reach;with family/visitor present     Time:  0921-0950 PT Time Calculation (min) (ACUTE ONLY): 29 min  Charges:  $Gait Training: 8-22 mins $Therapeutic Activity: 8-22 mins                    G Codes:      Rica Koyanagi  PTA [facility]  Acute  Rehab Pager      [phone]

## 2014-09-01 NOTE — Progress Notes (Signed)
Pt has already placed himself on home CPAP and is resting comfortably. RT will continue to monitor as needed.

## 2014-09-02 ENCOUNTER — Other Ambulatory Visit: Payer: Self-pay | Admitting: Physician Assistant

## 2014-09-02 LAB — GLUCOSE, CAPILLARY
GLUCOSE-CAPILLARY: 233 mg/dL — AB (ref 70–99)
GLUCOSE-CAPILLARY: 228 mg/dL — AB (ref 70–99)
Glucose-Capillary: 180 mg/dL — ABNORMAL HIGH (ref 70–99)
Glucose-Capillary: 209 mg/dL — ABNORMAL HIGH (ref 70–99)

## 2014-09-02 NOTE — Progress Notes (Signed)
Patient ID: Oscar [name], male   DOB: 1946-04-28, 69 y.o.   MRN: [id number]  [facility] PROGRESS NOTE  LISTER BRIZZI JME:[id number] DOB: 1946/07/11 DOA: 08/27/2014 PCP: Henrine Screws, MD   Brief narrative:    69 year old male with a history of diabetes, hypertension, atrial fibrillation on anticoagulation, presented with pain in the left ankle area. He was febrile and tachycardic. He was also noted to have elevated WBC. He was admitted for further management of cellulitis.  Assessment/Plan:    Principal Problem:  Sepsis secondary to cellulitis, left ankle wound, in pt with diabetes  - left ankle wound, 3x3x1 cm, wound bed 50% red, 50% dark purple with old dried blood, still with pink drainage but improving  - MRI left ankle with fluid collection, reviewed by Dr. Sharol Given, no need for drainage  - continued Vancomycin and Zosyn for 5 days, transition to oral ABX 1/22 (chagne to doxycycline and keflex) - continue Doxycycline and Keflex day #2 - continue analgesia as needed  - WBC WNL 1/22  Active Problems:  Atrial fibrillation with RVR - rate controlled, continue Cardizem and Apixaban  - pt with no chest pain or shortness of breath  - no need for telemetry monitoring, d/c telemetry 1/21  Diabetes mellitus type 2, uncontrolled, with complications of PVD, CKD stage II  - continue Lantus and SSI  - increased the dose of Lantus from 20 U to 30 U to improve CBG control (1/21) --> CBG better controlled   CKD (chronic kidney disease), stage II (based on GFR)  - Cr remains stable, WNL 1/22 - no need for repeat BMP  Hypertension - reasonable inpatient control  - on Cardizem and lasix   Anemia of chronic disease, CKD, DM - no signs of active bleeding  Chronic diastolic heart failure - no signs of volume overload except LE edema but weight is up since admission: 287 lbs --> 297 lbs --> 293 lbs --> 292 lbs this AM - continue to monitor daily weights, strict I's  and O's  - resumed home dose Lasix 40 mg PO QD 1/20, no changes in regimen needed at this time   Morbid obesity, BMI > 40  HLD - continue statin   Gout  - continue Uloric  - uric acid level 3 (1/21)  DVT prophylaxis: pt already on Apixaban for atrial fib   Code Status: Full.  Family Communication: plan of care discussed with the patient and family at bedside  Disposition Plan: Home in AM, pending weather conditions   IV access:  Peripheral IV Procedures and diagnostic studies:   Dg Ankle Left 08/28/2014 No acute osseous findings.   Mr Ankle Left Wo Contrast 08/31/2014 Abnormal fluid collection tracking around the lateral 180 degrees of the Achilles tendon and in the adjacent soft tissues, and possibly draining to the skin. Appearance concerning for abscess/infected fluid collection. There is adjacent tendinopathy in the Achilles tendon along with Achilles tendon expansion. The irregular collection measures 3.3 by 1.6 by 5.5 cm. 2. Generalized subcutaneous edema in the ankle and dorsum of the foot, suspicious for cellulitis. No findings of osteomyelitis. 3. Small non fragmented osteochondral lesion along the medial talar dome. 4. Mild peroneus longus and tibialis posterior tenosynovitis. 5. Thickening of the medial band of the plantar fascia focally, possibly T2 fasciitis or plantar fibromatosis. 6. Anterior talofibular ligament insufficiency. 7. Attenuated tibiospring component of the deltoid ligament, query prior injury.   Medical Consultants:  Ortho - Dr. Sharol Given  Other Consultants:  Wound care team  IAnti-Infectives:   Vancomycin 1/18 --> 1/22 Zosyn 1/18 --> 1/22 Doxycycline 1/22 --> Keflex 1/22 -->  [name], MD  [facility] Pager [phone]  If 7PM-7AM, please contact night-coverage [email or web address] Password [facility] 09/02/2014, 12:48 PM   LOS: 6 days   HPI/Subjective: No events overnight.   Objective: Filed Vitals:   09/01/14 1419  09/01/14 2202 09/02/14 0525 09/02/14 1035  BP: 126/65 139/71 121/61 142/91  Pulse: 93 80 82   Temp: 98.2 F (36.8 C) 98.2 F (36.8 C) 98.2 F (36.8 C)   TempSrc: Oral Oral Oral   Resp: 18 18 16    Height:      Weight:   132.9 kg (292 lb 15.9 oz)   SpO2: 95% 94% 95%     Intake/Output Summary (Last 24 hours) at 09/02/14 1248 Last data filed at 09/01/14 2357  Gross per 24 hour  Intake    240 ml  Output   1320 ml  Net  -1080 ml    Exam:   General:  Pt is alert, follows commands appropriately, not in acute distress  Cardiovascular: Regular rate and rhythm, S1/S2, no murmurs, no rubs, no gallops  Respiratory: Clear to auscultation bilaterally, no wheezing, no crackles, no rhonchi  Abdomen: Soft, non tender, non distended, bowel sounds present, no guarding  Data Reviewed: Basic Metabolic Panel:  Recent Labs Lab 08/28/14 0251 08/29/14 0453 08/30/14 0419 08/31/14 0447 09/01/14 0505  NA 135 140 136 134* 138  K 4.1 3.9 4.0 3.5 3.7  CL 101 105 104 97 101  CO2 25 28 25 26 28   GLUCOSE 420* 194* 226* 242* 225*  BUN 22 24* 22 20 21   CREATININE 1.24 1.33 1.30 1.26 1.22  CALCIUM 8.9 8.9 8.6 8.1* 8.6   Liver Function Tests:  Recent Labs Lab 08/28/14 0251 08/29/14 0453  AST 16 14  ALT 14 12  ALKPHOS 54 46  BILITOT 0.8 0.8  PROT 7.7 7.3  ALBUMIN 3.4* 3.1*   CBC:  Recent Labs Lab 08/28/14 0251 08/29/14 0453 08/30/14 0419 08/31/14 0447 09/01/14 0505  WBC 14.6* 12.8* 11.4* 10.0 8.1  NEUTROABS 12.5* 9.8*  --   --   --   HGB 10.1* 9.4* 9.1* 9.0* 9.2*  HCT 31.7* 31.2* 31.1* 30.0* 30.6*  MCV 81.5 83.9 84.1 83.1 83.4  PLT 209 201 252 219 252    CBG:  Recent Labs Lab 09/01/14 1201 09/01/14 1706 09/01/14 2209 09/02/14 0732 09/02/14 1131  GLUCAP 228* 195* 222* 180* 233*    Recent Results (from the past 240 hour(s))  Culture, blood (routine x 2)     Status: None (Preliminary result)   Collection Time: 08/28/14  3:54 AM  Result Value Ref Range Status    Specimen Description BLOOD L FOREARM  Final   Special Requests BOTTLES DRAWN AEROBIC AND ANAEROBIC 2.5ML  Final   Culture   Final           BLOOD CULTURE RECEIVED NO GROWTH TO DATE CULTURE WILL BE HELD FOR 5 DAYS BEFORE ISSUING A FINAL NEGATIVE REPORT Note: Culture results may be compromised due to an inadequate volume of blood received in culture bottles. Performed at [facility]    Report Status PENDING  Incomplete  Culture, blood (routine x 2)     Status: None (Preliminary result)   Collection Time: 08/28/14  3:54 AM  Result Value Ref Range Status   Specimen Description BLOOD R HAND  Final   Special Requests BOTTLES DRAWN AEROBIC AND ANAEROBIC  4CC  Final   Culture   Final           BLOOD CULTURE RECEIVED NO GROWTH TO DATE CULTURE WILL BE HELD FOR 5 DAYS BEFORE ISSUING A FINAL NEGATIVE REPORT Performed at [facility]    Report Status PENDING  Incomplete     Scheduled Meds: . apixaban  5 mg Oral BID  . cephALEXin  500 mg Oral 3 times per day  . collagenase   Topical Daily  . diltiazem  360 mg Oral Daily  . doxycycline  100 mg Oral Q12H  . escitalopram  10 mg Oral Daily  . febuxostat  40 mg Oral Daily  . furosemide  40 mg Oral Daily  . insulin aspart  0-20 Units Subcutaneous TID WC  . insulin aspart  0-5 Units Subcutaneous QHS  . insulin glargine  30 Units Subcutaneous Daily  . pantoprazole  40 mg Oral Daily  . potassium chloride SA  20 mEq Oral Daily  . simvastatin  20 mg Oral q1800  . sodium chloride  3 mL Intravenous Q12H  . tamsulosin  0.4 mg Oral QPC breakfast   Continuous Infusions:

## 2014-09-02 NOTE — Progress Notes (Addendum)
Occupational Therapy Treatment Patient Details Name: Oscar [name] MRN: [id number] DOB: April 13, 1946 Today's Date: 09/02/2014    History of present illness 69 year old male with a history of diabetes, hypertension, atrial fibrillation on anticoagulation, presented with pain in the left ankle area and admitted for further management of cellulitis.   OT comments  Noted pt may d/c home tomorrow.  He has a high commode at home but he has limited activity tolerance.  He doesn't want a 3:1 yet--wants to try his toilet.  He does need a RW, possibly wide one.  Wife is on 02 but can assist with clothing.  He does have a high bed with a stool.  Did not practice this today.  Follow Up Recommendations  No OT follow up;Supervision/Assistance - 24 hour    Equipment Recommendations   (pt would benefit from 3:1 but wants to try his commode at home.  Pt does need RW--possibly wide)    Recommendations for Other Services      Precautions / Restrictions Precautions Precautions: Fall Precaution Comments: L PF AFO all times Restrictions Weight Bearing Restrictions: No       Mobility Bed Mobility Overal bed mobility: Modified Independent                Transfers     Transfers: Sit to/from Stand Sit to Stand: Supervision         General transfer comment: from high bed    Balance                                   ADL                           Toilet Transfer: Min guard;Stand-pivot;BSC;RW (wide RW)             General ADL Comments: Pt agreeable to SPT to 3:1 commode.  Min guard for safety and cues for walker distance, keeping feet between walker.  Brought wide walker into room to use.   pt has a high commode and feels he can get to bathroom; declined 3:1 at this time.  Feels he can call 2 family members if he gets stuck.  Wife is on 02 but can assist with ADLs--AFO is bulky.  Pt will need wide legged pants.  Pt does have a high bed:  he may need a stool to  get back in--did not practice this.  Pt has limited activity tolerance due to pain:  He and wife do not feel he will need rehab.  Pt will sponge bathe at home:  Needs MD clearance to shower.  He cannot tolerate stepping into shower.  Wife is familiar with tub bench--he is not ready for this at this time.      Vision                     Perception     Praxis      Cognition   Behavior During Therapy: [facility] for tasks assessed/performed Overall Cognitive Status: Within Functional Limits for tasks assessed                       Extremity/Trunk Assessment               Exercises     Shoulder Instructions       General Comments  Pertinent Vitals/ Pain       Pain Score: 8  Pain Location: L foot Pain Descriptors / Indicators: Aching Pain Intervention(s): Monitored during session  Home Living                                          Prior Functioning/Environment              Frequency Min 2X/week     Progress Toward Goals  OT Goals(current goals can now be found in the care plan section)  Progress towards OT goals: Progressing toward goals     Plan      Co-evaluation                 End of Session     Activity Tolerance Patient limited by pain   Patient Left in bed;with call bell/phone within reach;with family/visitor present   Nurse Communication          Time:  -     Charges: OT General Charges $OT Visit: 1 Procedure OT Treatments $Self Care/Home Management : 8-22 mins $Therapeutic Activity: 8-22 mins  Shenetta Schnackenberg 09/02/2014, 3:06 PM  Lesle Chris, OTR/L [phone] 09/02/2014

## 2014-09-03 LAB — GLUCOSE, CAPILLARY
Glucose-Capillary: 213 mg/dL — ABNORMAL HIGH (ref 70–99)
Glucose-Capillary: 235 mg/dL — ABNORMAL HIGH (ref 70–99)

## 2014-09-03 LAB — CULTURE, BLOOD (ROUTINE X 2)
Culture: NO GROWTH
Culture: NO GROWTH

## 2014-09-03 MED ORDER — CEPHALEXIN 500 MG PO CAPS: 500.0000 mg | ORAL_CAPSULE | Freq: Three times a day (TID) | ORAL | Status: DC

## 2014-09-03 MED ORDER — HYDROCODONE-ACETAMINOPHEN 5-325 MG PO TABS: 1.0000 | ORAL_TABLET | ORAL | Status: DC | PRN

## 2014-09-03 MED ORDER — BLOOD GLUCOSE MONITOR KIT: PACK | Status: AC

## 2014-09-03 MED ORDER — COLLAGENASE 250 UNIT/GM EX OINT: 1.0000 "application " | TOPICAL_OINTMENT | Freq: Every day | CUTANEOUS | Status: DC

## 2014-09-03 MED ORDER — INSULIN GLARGINE 100 UNIT/ML ~~LOC~~ SOLN: 20.0000 [IU] | Freq: Every day | SUBCUTANEOUS | Status: DC

## 2014-09-03 MED ORDER — DOXYCYCLINE HYCLATE 100 MG PO TABS: 100.0000 mg | ORAL_TABLET | Freq: Two times a day (BID) | ORAL | Status: DC

## 2014-09-03 NOTE — Discharge Summary (Signed)
Physician Discharge Summary  Oscar [name]:[id number] DOB: 27-Jun-1946 DOA: 08/27/2014  PCP: Henrine Screws, MD  Admit date: 08/27/2014 Discharge date: 09/03/2014  Recommendations for Outpatient Follow-up:  1. Pt will need to follow up with PCP in 2-3 weeks post discharge 2. Please obtain BMP to evaluate electrolytes and kidney function 3. Please also check CBC to evaluate Hg and Hct levels 4. Pt advised to continue taking Keflex and Doxycycline for 14 more days post discharge 5. Pt also advised to follow up with Dr. Sharol Given, to determine need for continuation of ABX upon follow up and wound healing  6. Please note that pt was started on Lantus to take for better sugar control to facilitate wound healing  7. Pt advised to stop taking Glyburide while on Lantus to avoid hypoglycemic events   Discharge Diagnoses:  Principal Problem:   Cellulitis Active Problems:   Atrial fibrillation with RVR   Gout   Diabetes mellitus type 2, uncontrolled, with complications   CKD (chronic kidney disease), stage II   Hypertension   Anemia   CVA (cerebral infarction)   Chronic diastolic heart failure   Sepsis  Discharge Condition: Stable  Diet recommendation: Heart healthy diet discussed in details   Brief narrative:    69 year old male with a history of diabetes, hypertension, atrial fibrillation on anticoagulation, presented with pain in the left ankle area. He was febrile and tachycardic. He was also noted to have elevated WBC. He was admitted for further management of cellulitis.  Assessment/Plan:    Principal Problem:  Sepsis secondary to cellulitis, left ankle wound, in pt with diabetes  - left ankle wound, 3x3x1 cm, wound bed 50% red, 50% dark purple with old dried blood, still with pink drainage but improving  - MRI left ankle with fluid collection, reviewed by Dr. Sharol Given, no need for drainage  - continued Vancomycin and Zosyn for 5 days, transition to oral ABX 1/22  (chagne to doxycycline and keflex) - continue Doxycycline and Keflex day #3, continue for 14 more days post discharge Active Problems:  Atrial fibrillation with RVR - rate controlled, continue Cardizem and Apixaban  - pt with no chest pain or shortness of breath   Diabetes mellitus type 2, uncontrolled, with complications of PVD, CKD stage II  - continue Lantus upon discharge - continue Metformin as well, but stop taking Glyburide while taking lantus   CKD (chronic kidney disease), stage II (based on GFR)  - Cr remains stable, WNL 1/22  Hypertension - reasonable inpatient control  - on Cardizem and lasix   Anemia of chronic disease, CKD, DM - no signs of active bleeding  Chronic diastolic heart failure - no signs of volume overload except LE edema but weight is up since admission: 287 lbs --> 297 lbs --> 293 lbs --> 292 lbs this AM - resumed home dose Lasix 40 mg PO QD 1/20, no changes in regimen needed at this time   Morbid obesity, BMI > 40  HLD - continue statin   Gout  - continue Uloric  - uric acid level 3 (1/21)  Code Status: Full.  Family Communication: plan of care discussed with the patient and family at bedside  Disposition Plan: Home   IV access:  Peripheral IV Procedures and diagnostic studies:   Dg Ankle Left 08/28/2014 No acute osseous findings.   Mr Ankle Left Wo Contrast 08/31/2014 Abnormal fluid collection tracking around the lateral 180 degrees of the Achilles tendon and in the adjacent soft tissues,  and possibly draining to the skin. Appearance concerning for abscess/infected fluid collection. There is adjacent tendinopathy in the Achilles tendon along with Achilles tendon expansion. The irregular collection measures 3.3 by 1.6 by 5.5 cm. 2. Generalized subcutaneous edema in the ankle and dorsum of the foot, suspicious for cellulitis. No findings of osteomyelitis. 3. Small non fragmented osteochondral lesion along the medial  talar dome. 4. Mild peroneus longus and tibialis posterior tenosynovitis. 5. Thickening of the medial band of the plantar fascia focally, possibly T2 fasciitis or plantar fibromatosis. 6. Anterior talofibular ligament insufficiency. 7. Attenuated tibiospring component of the deltoid ligament, query prior injury.   Medical Consultants:  Ortho - Dr. Sharol Given  Other Consultants:  Wound care team  IAnti-Infectives:   Vancomycin 1/18 --> 1/22 Zosyn 1/18 --> 1/22 Doxycycline 1/22 --> Keflex 1/22 -->  Discharge Exam: Filed Vitals:   09/03/14 1010  BP: 126/65  Pulse:   Temp:   Resp:    Filed Vitals:   09/02/14 1436 09/02/14 2211 09/03/14 0632 09/03/14 1010  BP: 105/52 141/65 147/66 126/65  Pulse: 84 92 76   Temp: 98.1 F (36.7 C) 98.6 F (37 C) 97.5 F (36.4 C)   TempSrc: Oral Oral Oral   Resp: _0 Height:      Weight:   130.7 kg (288 lb 2.3 oz)   SpO2: 92% 96% 97%     General: Pt is alert, follows commands appropriately, not in acute distress Cardiovascular: Regular rate and rhythm, S1/S2 +, no rubs, no gallops Respiratory: Clear to auscultation bilaterally, no wheezing, no crackles, no rhonchi Abdominal: Soft, non tender, non distended, bowel sounds +, no guarding Extremities: left ankle wound 3 x 3 cm with small amount of pink drainage, cellulitis improving, less erythema and edema   Discharge Instructions     Medication List    STOP taking these medications        glyBURIDE 5 MG tablet  Commonly known as:  DIABETA      TAKE these medications        acetaminophen 500 MG tablet  Commonly known as:  TYLENOL  Take 1,500 mg by mouth every 6 (six) hours as needed for moderate pain.     apixaban 5 MG Tabs tablet  Commonly known as:  ELIQUIS  Take 1 tablet (5 mg total) by mouth 2 (two) times daily.     blood glucose meter kit and supplies Kit  Dispense based on patient and insurance preference. Use up to four times daily as directed. (FOR  ICD-9 250.00, 250.01).     cephALEXin 500 MG capsule  Commonly known as:  KEFLEX  Take 1 capsule (500 mg total) by mouth every 8 (eight) hours.     collagenase ointment  Commonly known as:  SANTYL  Apply 1 application topically daily.     diltiazem 360 MG 24 hr capsule  Commonly known as:  CARDIZEM CD  Take 1 capsule (360 mg total) by mouth daily.     doxycycline 100 MG tablet  Commonly known as:  VIBRA-TABS  Take 1 tablet (100 mg total) by mouth every 12 (twelve) hours.     escitalopram 10 MG tablet  Commonly known as:  LEXAPRO  Take 10 mg by mouth daily.     febuxostat 40 MG tablet  Commonly known as:  ULORIC  Take 40 mg by mouth daily.     furosemide 40 MG tablet  Commonly known as:  LASIX  Take 1 tablet (40 mg  total) by mouth daily.     HYDROcodone-acetaminophen 5-325 MG per tablet  Commonly known as:  NORCO/VICODIN  Take 1-2 tablets by mouth every 4 (four) hours as needed for moderate pain.     indomethacin 50 MG capsule  Commonly known as:  INDOCIN  Take 50 mg by mouth 3 (three) times daily as needed (for gout flare ups).     insulin aspart 100 UNIT/ML injection  Commonly known as:  novoLOG  Inject 0-9 Units into the skin 3 (three) times daily with meals.     insulin glargine 100 UNIT/ML injection  Commonly known as:  LANTUS  Inject 0.2 mLs (20 Units total) into the skin at bedtime.     metFORMIN 1000 MG tablet  Commonly known as:  GLUCOPHAGE  Take 1,000 mg by mouth 2 (two) times daily with a meal.     pantoprazole 40 MG tablet  Commonly known as:  PROTONIX  Take 1 tablet (40 mg total) by mouth daily.     potassium chloride SA 20 MEQ tablet  Commonly known as:  K-DUR,KLOR-CON  TAKE 1 TABLET BY MOUTH DAILY     simvastatin 20 MG tablet  Commonly known as:  ZOCOR  Take 20 mg by mouth daily.     tamsulosin 0.4 MG Caps capsule  Commonly known as:  FLOMAX  Take 1 capsule (0.4 mg total) by mouth daily after breakfast.           Follow-up  Information    Follow up with DUDA,MARCUS V, MD In 2 weeks.   Specialty:  Orthopedic Surgery   Contact information:   [place] [address] [phone]       Follow up with Henrine Screws, MD.   Specialty:  Internal Medicine   Contact information:   [address] [place] [address] [phone]       Follow up with [name], MD.   Specialty:  Internal Medicine   Why:  As needed, call my cell [phone]   Contact information:   [address] [place] [address] [phone]        The results of significant diagnostics from this hospitalization (including imaging, microbiology, ancillary and laboratory) are listed below for reference.     Microbiology: Recent Results (from the past 240 hour(s))  Culture, blood (routine x 2)     Status: None (Preliminary result)   Collection Time: 08/28/14  3:54 AM  Result Value Ref Range Status   Specimen Description BLOOD L FOREARM  Final   Special Requests BOTTLES DRAWN AEROBIC AND ANAEROBIC 2.5ML  Final   Culture   Final           BLOOD CULTURE RECEIVED NO GROWTH TO DATE CULTURE WILL BE HELD FOR 5 DAYS BEFORE ISSUING A FINAL NEGATIVE REPORT Note: Culture results may be compromised due to an inadequate volume of blood received in culture bottles. Performed at [facility]    Report Status PENDING  Incomplete  Culture, blood (routine x 2)     Status: None (Preliminary result)   Collection Time: 08/28/14  3:54 AM  Result Value Ref Range Status   Specimen Description BLOOD R HAND  Final   Special Requests BOTTLES DRAWN AEROBIC AND ANAEROBIC 4CC  Final   Culture   Final           BLOOD CULTURE RECEIVED NO GROWTH TO DATE CULTURE WILL BE HELD FOR 5 DAYS BEFORE ISSUING A FINAL  NEGATIVE REPORT Performed at [facility]    Report Status PENDING  Incomplete     Labs: Basic Metabolic Panel:  Recent Labs Lab 08/28/14 0251 08/29/14 0453  08/30/14 0419 08/31/14 0447 09/01/14 0505  NA 135 140 136 134* 138  K 4.1 3.9 4.0 3.5 3.7  CL 101 105 104 97 101  CO2 _0 GLUCOSE 420* 194* 226* 242* 225*  BUN 22 24* _1 CREATININE 1.24 1.33 1.30 1.26 1.22  CALCIUM 8.9 8.9 8.6 8.1* 8.6   Liver Function Tests:  Recent Labs Lab 08/28/14 0251 08/29/14 0453  AST 16 14  ALT 14 12  ALKPHOS 54 46  BILITOT 0.8 0.8  PROT 7.7 7.3  ALBUMIN 3.4* 3.1*   CBC:  Recent Labs Lab 08/28/14 0251 08/29/14 0453 08/30/14 0419 08/31/14 0447 09/01/14 0505  WBC 14.6* 12.8* 11.4* 10.0 8.1  NEUTROABS 12.5* 9.8*  --   --   --   HGB 10.1* 9.4* 9.1* 9.0* 9.2*  HCT 31.7* 31.2* 31.1* 30.0* 30.6*  MCV 81.5 83.9 84.1 83.1 83.4  PLT 209 201 252 219 252   CBG:  Recent Labs Lab 09/02/14 0732 09/02/14 1131 09/02/14 1700 09/02/14 2208 09/03/14 0902  GLUCAP 180* 233* 228* 209* 213*   SIGNED: Time coordinating discharge: Over 30 minutes  [name], MD  [facility] 09/03/2014, 10:19 AM Pager [phone]  If 7PM-7AM, please contact night-coverage [email or web address] Password [email or web address]

## 2014-09-03 NOTE — Progress Notes (Signed)
CARE MANAGEMENT NOTE 09/03/2014  Patient:  Oscar [name], Oscar [name]   Account Number:  [id number]  Date Initiated:  08/28/2014  Documentation initiated by:  Karl Bales  Subjective/Objective Assessment:   pt admitted with cellulitis     Action/Plan:   from home   Anticipated DC Date:  09/03/2014   Anticipated DC Plan:  [place]  CM consult      [facility] Choice  HOME HEALTH   Choice offered to / List presented to:  C-1 Patient   DME arranged  [place] Moselle      DME agency  [place] arranged  HH-1 RN  [place]      [place]   Status of service:   Medicare Important Message given?  NO (If response is "NO", the following Medicare IM given date fields will be blank) Date Medicare IM given:   Medicare IM given by:   Date Additional Medicare IM given:   Additional Medicare IM given by:    Discharge Disposition:  [place]  Per UR Regulation:    If discussed at Long Length of Stay Meetings, dates discussed:    Comments:  09/03/2014 1200 NCM spoke to pt and gave permission to speak to wife. Wife states she has used [facility] in the past. Requesting [facility] for [facility]. Contacted [facility] DME rep for RW for home. Contacted [facility] HH rep for [facility] for possible dc today. Jonnie Finner RN CCM Case Mgmt phone [phone]

## 2014-09-03 NOTE — Discharge Instructions (Signed)
Apply Santyl ointment to left heel ulcer daily plus dry dressing. Wash leg with soap and water prior to dressing change daily.  Where PR AFO 24 hours a day.  Elevate foot level with the heart.   Cellulitis Cellulitis is an infection of the skin and the tissue beneath it. The infected area is usually red and tender. Cellulitis occurs most often in the arms and lower legs.  CAUSES  Cellulitis is caused by bacteria that enter the skin through cracks or cuts in the skin. The most common types of bacteria that cause cellulitis are staphylococci and streptococci. SIGNS AND SYMPTOMS   Redness and warmth.  Swelling.  Tenderness or pain.  Fever. DIAGNOSIS  Your health care provider can usually determine what is wrong based on a physical exam. Blood tests may also be done. TREATMENT  Treatment usually involves taking an antibiotic medicine. HOME CARE INSTRUCTIONS   Take your antibiotic medicine as directed by your health care provider. Finish the antibiotic even if you start to feel better.  Keep the infected arm or leg elevated to reduce swelling.  Apply a warm cloth to the affected area up to 4 times per day to relieve pain.  Take medicines only as directed by your health care provider.  Keep all follow-up visits as directed by your health care provider. SEEK MEDICAL CARE IF:   You notice red streaks coming from the infected area.  Your red area gets larger or turns dark in color.  Your bone or joint underneath the infected area becomes painful after the skin has healed.  Your infection returns in the same area or another area.  You notice a swollen bump in the infected area.  You develop new symptoms.  You have a fever. SEEK IMMEDIATE MEDICAL CARE IF:   You feel very sleepy.  You develop vomiting or diarrhea.  You have a general ill feeling (malaise) with muscle aches and pains. MAKE SURE YOU:   Understand these instructions.  Will watch your condition.  Will  get help right away if you are not doing well or get worse. Document Released: 05/07/2005 Document Revised: 12/12/2013 Document Reviewed: 10/13/2011 [facility] Patient Information 2015 [place], [place]. This information is not intended to replace advice given to you by your health care provider. Make sure you discuss any questions you have with your health care provider.

## 2014-09-04 DIAGNOSIS — E1165 Type 2 diabetes mellitus with hyperglycemia: Secondary | ICD-10-CM | POA: Diagnosis not present

## 2014-09-04 DIAGNOSIS — L03116 Cellulitis of left lower limb: Secondary | ICD-10-CM | POA: Diagnosis not present

## 2014-09-04 DIAGNOSIS — Z4800 Encounter for change or removal of nonsurgical wound dressing: Secondary | ICD-10-CM | POA: Diagnosis not present

## 2014-09-04 DIAGNOSIS — S90522D Blister (nonthermal), left ankle, subsequent encounter: Secondary | ICD-10-CM | POA: Diagnosis not present

## 2014-09-04 DIAGNOSIS — Z794 Long term (current) use of insulin: Secondary | ICD-10-CM | POA: Diagnosis not present

## 2014-09-08 DIAGNOSIS — L03116 Cellulitis of left lower limb: Secondary | ICD-10-CM | POA: Diagnosis not present

## 2014-09-08 DIAGNOSIS — Z4800 Encounter for change or removal of nonsurgical wound dressing: Secondary | ICD-10-CM | POA: Diagnosis not present

## 2014-09-08 DIAGNOSIS — S90522D Blister (nonthermal), left ankle, subsequent encounter: Secondary | ICD-10-CM | POA: Diagnosis not present

## 2014-09-08 DIAGNOSIS — E1165 Type 2 diabetes mellitus with hyperglycemia: Secondary | ICD-10-CM | POA: Diagnosis not present

## 2014-09-08 DIAGNOSIS — Z794 Long term (current) use of insulin: Secondary | ICD-10-CM | POA: Diagnosis not present

## 2014-09-12 ENCOUNTER — Other Ambulatory Visit (HOSPITAL_COMMUNITY): Payer: Self-pay | Admitting: Orthopedic Surgery

## 2014-09-13 DIAGNOSIS — Z4800 Encounter for change or removal of nonsurgical wound dressing: Secondary | ICD-10-CM | POA: Diagnosis not present

## 2014-09-13 DIAGNOSIS — S90522D Blister (nonthermal), left ankle, subsequent encounter: Secondary | ICD-10-CM | POA: Diagnosis not present

## 2014-09-13 DIAGNOSIS — E1165 Type 2 diabetes mellitus with hyperglycemia: Secondary | ICD-10-CM | POA: Diagnosis not present

## 2014-09-13 DIAGNOSIS — Z794 Long term (current) use of insulin: Secondary | ICD-10-CM | POA: Diagnosis not present

## 2014-09-13 DIAGNOSIS — L03116 Cellulitis of left lower limb: Secondary | ICD-10-CM | POA: Diagnosis not present

## 2014-09-14 ENCOUNTER — Encounter (HOSPITAL_COMMUNITY): Payer: Self-pay | Admitting: *Deleted

## 2014-09-14 MED ORDER — DEXTROSE 5 % IV SOLN
3.0000 g | INTRAVENOUS | Status: AC
  Administered 2014-09-15: 3 g via INTRAVENOUS
  Filled 2014-09-14: qty 3000

## 2014-09-14 NOTE — Progress Notes (Signed)
Spoke with pt's wife for pre-op call. Pt was in room with her answering some questions as needed.

## 2014-09-14 NOTE — Progress Notes (Signed)
Called Dr. Jess Barters office to verify that pt was not supposed to stop his Eliquis. Spoke with Baird Lyons,  she asked Dr. Sharol Given and he said pt was not supposed to stop Eliquis.

## 2014-09-15 ENCOUNTER — Inpatient Hospital Stay (HOSPITAL_COMMUNITY): Payer: Medicare Other | Admitting: Certified Registered"

## 2014-09-15 ENCOUNTER — Encounter (HOSPITAL_COMMUNITY): Payer: Self-pay | Admitting: Certified Registered"

## 2014-09-15 ENCOUNTER — Encounter (HOSPITAL_COMMUNITY)
Admission: RE | Disposition: A | Discharge status: Home Health | Payer: Self-pay | Source: Ambulatory Visit | Attending: Orthopedic Surgery

## 2014-09-15 ENCOUNTER — Inpatient Hospital Stay (HOSPITAL_COMMUNITY)
Admission: RE | Admit: 2014-09-15 | Discharge: 2014-09-18 | DRG: 982 | Disposition: A | Discharge status: Home Health | Payer: Medicare Other | Source: Ambulatory Visit | Attending: Orthopedic Surgery | Admitting: Orthopedic Surgery

## 2014-09-15 DIAGNOSIS — E11621 Type 2 diabetes mellitus with foot ulcer: Secondary | ICD-10-CM | POA: Diagnosis not present

## 2014-09-15 DIAGNOSIS — K219 Gastro-esophageal reflux disease without esophagitis: Secondary | ICD-10-CM | POA: Diagnosis present

## 2014-09-15 DIAGNOSIS — I739 Peripheral vascular disease, unspecified: Secondary | ICD-10-CM | POA: Diagnosis present

## 2014-09-15 DIAGNOSIS — G629 Polyneuropathy, unspecified: Secondary | ICD-10-CM | POA: Diagnosis present

## 2014-09-15 DIAGNOSIS — L89629 Pressure ulcer of left heel, unspecified stage: Secondary | ICD-10-CM | POA: Diagnosis present

## 2014-09-15 DIAGNOSIS — Z91041 Radiographic dye allergy status: Secondary | ICD-10-CM

## 2014-09-15 DIAGNOSIS — Z6841 Body Mass Index (BMI) 40.0 and over, adult: Secondary | ICD-10-CM | POA: Diagnosis not present

## 2014-09-15 DIAGNOSIS — K449 Diaphragmatic hernia without obstruction or gangrene: Secondary | ICD-10-CM | POA: Diagnosis present

## 2014-09-15 DIAGNOSIS — I10 Essential (primary) hypertension: Secondary | ICD-10-CM | POA: Diagnosis present

## 2014-09-15 DIAGNOSIS — L97409 Non-pressure chronic ulcer of unspecified heel and midfoot with unspecified severity: Secondary | ICD-10-CM

## 2014-09-15 DIAGNOSIS — I96 Gangrene, not elsewhere classified: Secondary | ICD-10-CM | POA: Diagnosis not present

## 2014-09-15 HISTORY — DX: Personal history of other infectious and parasitic diseases: Z86.19

## 2014-09-15 HISTORY — DX: Other specified postprocedural states: R11.2

## 2014-09-15 HISTORY — DX: Other specified postprocedural states: Z98.890

## 2014-09-15 HISTORY — DX: Cerebral infarction, unspecified: I63.9

## 2014-09-15 HISTORY — DX: Benign prostatic hyperplasia without lower urinary tract symptoms: N40.0

## 2014-09-15 HISTORY — PX: I&D EXTREMITY: SHX5045

## 2014-09-15 LAB — COMPREHENSIVE METABOLIC PANEL
ALBUMIN: 3.6 g/dL (ref 3.5–5.2)
ALK PHOS: 40 U/L (ref 39–117)
ALT: 14 U/L (ref 0–53)
AST: 23 U/L (ref 0–37)
Anion gap: 10 (ref 5–15)
BILIRUBIN TOTAL: 0.7 mg/dL (ref 0.3–1.2)
BUN: 24 mg/dL — ABNORMAL HIGH (ref 6–23)
CO2: 27 mmol/L (ref 19–32)
CREATININE: 1.34 mg/dL (ref 0.50–1.35)
Calcium: 9.8 mg/dL (ref 8.4–10.5)
Chloride: 105 mmol/L (ref 96–112)
GFR calc Af Amer: 61 mL/min — ABNORMAL LOW (ref >90)
GFR, EST NON AFRICAN AMERICAN: 52 mL/min — AB (ref >90)
Glucose, Bld: 86 mg/dL (ref 70–99)
Potassium: 4.1 mmol/L (ref 3.5–5.1)
Sodium: 142 mmol/L (ref 135–145)
TOTAL PROTEIN: 8.4 g/dL — AB (ref 6.0–8.3)

## 2014-09-15 LAB — CBC
HEMATOCRIT: 33.7 % — AB (ref 39.0–52.0)
Hemoglobin: 10.4 g/dL — ABNORMAL LOW (ref 13.0–17.0)
MCH: 24.5 pg — ABNORMAL LOW (ref 26.0–34.0)
MCHC: 30.9 g/dL (ref 30.0–36.0)
MCV: 79.5 fL (ref 78.0–100.0)
Platelets: 301 10*3/uL (ref 150–400)
RBC: 4.24 MIL/uL (ref 4.22–5.81)
RDW: 15.3 % (ref 11.5–15.5)
WBC: 8.6 10*3/uL (ref 4.0–10.5)

## 2014-09-15 LAB — GLUCOSE, CAPILLARY
GLUCOSE-CAPILLARY: 79 mg/dL (ref 70–99)
GLUCOSE-CAPILLARY: 139 mg/dL — AB (ref 70–99)
Glucose-Capillary: 158 mg/dL — ABNORMAL HIGH (ref 70–99)

## 2014-09-15 LAB — PROTIME-INR
INR: 1.48 (ref 0.00–1.49)
Prothrombin Time: 18.1 seconds — ABNORMAL HIGH (ref 11.6–15.2)

## 2014-09-15 LAB — APTT: aPTT: 37 seconds (ref 24–37)

## 2014-09-15 SURGERY — IRRIGATION AND DEBRIDEMENT EXTREMITY
Anesthesia: General | Laterality: Left

## 2014-09-15 MED ORDER — HYDROMORPHONE HCL 1 MG/ML IJ SOLN
0.2500 mg | INTRAMUSCULAR | Status: DC | PRN
  Administered 2014-09-15: 0.5 mg via INTRAVENOUS

## 2014-09-15 MED ORDER — ESCITALOPRAM OXALATE 10 MG PO TABS
10.0000 mg | ORAL_TABLET | Freq: Every day | ORAL | Status: DC
  Administered 2014-09-15 – 2014-09-17 (×3): 10 mg via ORAL
  Filled 2014-09-15 (×4): qty 1

## 2014-09-15 MED ORDER — ONDANSETRON HCL 4 MG/2ML IJ SOLN: 4.0000 mg | Freq: Four times a day (QID) | INTRAMUSCULAR | Status: DC | PRN

## 2014-09-15 MED ORDER — HYDROMORPHONE HCL 1 MG/ML IJ SOLN
INTRAMUSCULAR | Status: AC
  Filled 2014-09-15: qty 1

## 2014-09-15 MED ORDER — INSULIN ASPART 100 UNIT/ML ~~LOC~~ SOLN
0.0000 [IU] | Freq: Three times a day (TID) | SUBCUTANEOUS | Status: DC
  Administered 2014-09-15: 2 [IU] via SUBCUTANEOUS

## 2014-09-15 MED ORDER — OXYCODONE-ACETAMINOPHEN 5-325 MG PO TABS
1.0000 | ORAL_TABLET | ORAL | Status: DC | PRN
  Administered 2014-09-15: 1 via ORAL
  Administered 2014-09-15 – 2014-09-18 (×10): 2 via ORAL
  Filled 2014-09-15 (×10): qty 2

## 2014-09-15 MED ORDER — METOCLOPRAMIDE HCL 5 MG/ML IJ SOLN
5.0000 mg | Freq: Three times a day (TID) | INTRAMUSCULAR | Status: DC | PRN
Start: 2014-09-15 — End: 2014-09-18

## 2014-09-15 MED ORDER — DEXTROSE 5 % IV SOLN
500.0000 mg | Freq: Four times a day (QID) | INTRAVENOUS | Status: DC | PRN
  Administered 2014-09-15: 500 mg via INTRAVENOUS
  Filled 2014-09-15 (×3): qty 5

## 2014-09-15 MED ORDER — SODIUM CHLORIDE 0.9 % IV SOLN
INTRAVENOUS | Status: DC
  Administered 2014-09-15: 10 mL/h via INTRAVENOUS

## 2014-09-15 MED ORDER — FENTANYL CITRATE 0.05 MG/ML IJ SOLN
INTRAMUSCULAR | Status: AC
  Filled 2014-09-15: qty 5

## 2014-09-15 MED ORDER — CEFAZOLIN SODIUM-DEXTROSE 2-3 GM-% IV SOLR
2.0000 g | Freq: Four times a day (QID) | INTRAVENOUS | Status: AC
  Administered 2014-09-15 – 2014-09-16 (×3): 2 g via INTRAVENOUS
  Filled 2014-09-15 (×4): qty 50

## 2014-09-15 MED ORDER — DOCUSATE SODIUM 100 MG PO CAPS
100.0000 mg | ORAL_CAPSULE | Freq: Two times a day (BID) | ORAL | Status: DC
  Administered 2014-09-15 – 2014-09-18 (×6): 100 mg via ORAL
  Filled 2014-09-15 (×6): qty 1

## 2014-09-15 MED ORDER — PROPOFOL 10 MG/ML IV BOLUS
INTRAVENOUS | Status: AC
  Filled 2014-09-15: qty 20

## 2014-09-15 MED ORDER — OXYCODONE HCL 5 MG PO TABS
5.0000 mg | ORAL_TABLET | Freq: Once | ORAL | Status: AC | PRN
  Administered 2014-09-15: 5 mg via ORAL

## 2014-09-15 MED ORDER — INSULIN GLARGINE 100 UNIT/ML ~~LOC~~ SOLN
20.0000 [IU] | Freq: Every day | SUBCUTANEOUS | Status: DC
  Administered 2014-09-15 – 2014-09-17 (×3): 20 [IU] via SUBCUTANEOUS
  Filled 2014-09-15 (×4): qty 0.2

## 2014-09-15 MED ORDER — LACTATED RINGERS IV SOLN
INTRAVENOUS | Status: DC
  Administered 2014-09-15: 13:00:00 via INTRAVENOUS

## 2014-09-15 MED ORDER — METHOCARBAMOL 500 MG PO TABS
500.0000 mg | ORAL_TABLET | Freq: Four times a day (QID) | ORAL | Status: DC | PRN
Start: 2014-09-15 — End: 2014-09-18
  Administered 2014-09-16 – 2014-09-17 (×3): 500 mg via ORAL
  Filled 2014-09-15 (×5): qty 1

## 2014-09-15 MED ORDER — OXYCODONE HCL 5 MG/5ML PO SOLN
5.0000 mg | Freq: Once | ORAL | Status: AC | PRN
Start: 2014-09-15 — End: 2014-09-15

## 2014-09-15 MED ORDER — APIXABAN 5 MG PO TABS
5.0000 mg | ORAL_TABLET | Freq: Two times a day (BID) | ORAL | Status: DC
  Administered 2014-09-15 – 2014-09-18 (×6): 5 mg via ORAL
  Filled 2014-09-15 (×7): qty 1

## 2014-09-15 MED ORDER — SIMVASTATIN 20 MG PO TABS
20.0000 mg | ORAL_TABLET | Freq: Every day | ORAL | Status: DC
  Administered 2014-09-15 – 2014-09-16 (×2): 20 mg via ORAL
  Filled 2014-09-15 (×2): qty 1

## 2014-09-15 MED ORDER — TAMSULOSIN HCL 0.4 MG PO CAPS
0.4000 mg | ORAL_CAPSULE | Freq: Every day | ORAL | Status: DC
  Administered 2014-09-16 – 2014-09-18 (×3): 0.4 mg via ORAL
  Filled 2014-09-15 (×5): qty 1

## 2014-09-15 MED ORDER — FENTANYL CITRATE 0.05 MG/ML IJ SOLN
INTRAMUSCULAR | Status: DC | PRN
  Administered 2014-09-15 (×4): 25 ug via INTRAVENOUS

## 2014-09-15 MED ORDER — HYDROMORPHONE HCL 1 MG/ML IJ SOLN
0.5000 mg | INTRAMUSCULAR | Status: DC | PRN
  Administered 2014-09-15 – 2014-09-17 (×9): 1 mg via INTRAVENOUS
  Filled 2014-09-15 (×9): qty 1

## 2014-09-15 MED ORDER — OXYCODONE HCL 5 MG PO TABS
ORAL_TABLET | ORAL | Status: AC
  Filled 2014-09-15: qty 1

## 2014-09-15 MED ORDER — OXYCODONE-ACETAMINOPHEN 5-325 MG PO TABS
ORAL_TABLET | ORAL | Status: AC
  Filled 2014-09-15: qty 1

## 2014-09-15 MED ORDER — PANTOPRAZOLE SODIUM 40 MG PO TBEC
40.0000 mg | DELAYED_RELEASE_TABLET | Freq: Every day | ORAL | Status: DC
  Administered 2014-09-16 – 2014-09-18 (×3): 40 mg via ORAL
  Filled 2014-09-15 (×3): qty 1

## 2014-09-15 MED ORDER — FEBUXOSTAT 40 MG PO TABS
40.0000 mg | ORAL_TABLET | Freq: Every day | ORAL | Status: DC
  Administered 2014-09-15 – 2014-09-18 (×4): 40 mg via ORAL
  Filled 2014-09-15 (×5): qty 1

## 2014-09-15 MED ORDER — ONDANSETRON HCL 4 MG PO TABS: 4.0000 mg | ORAL_TABLET | Freq: Four times a day (QID) | ORAL | Status: DC | PRN

## 2014-09-15 MED ORDER — FUROSEMIDE 40 MG PO TABS
40.0000 mg | ORAL_TABLET | Freq: Every day | ORAL | Status: DC
  Administered 2014-09-16 – 2014-09-18 (×3): 40 mg via ORAL
  Filled 2014-09-15 (×4): qty 1

## 2014-09-15 MED ORDER — PROPOFOL 10 MG/ML IV BOLUS
INTRAVENOUS | Status: DC | PRN
  Administered 2014-09-15: 200 mg via INTRAVENOUS

## 2014-09-15 MED ORDER — POTASSIUM CHLORIDE CRYS ER 20 MEQ PO TBCR
20.0000 meq | EXTENDED_RELEASE_TABLET | Freq: Every day | ORAL | Status: DC
  Administered 2014-09-15 – 2014-09-18 (×4): 20 meq via ORAL
  Filled 2014-09-15 (×5): qty 1

## 2014-09-15 MED ORDER — METFORMIN HCL 500 MG PO TABS
1000.0000 mg | ORAL_TABLET | Freq: Two times a day (BID) | ORAL | Status: DC
  Administered 2014-09-15 – 2014-09-18 (×6): 1000 mg via ORAL
  Filled 2014-09-15 (×8): qty 2

## 2014-09-15 MED ORDER — DILTIAZEM HCL ER COATED BEADS 360 MG PO CP24
360.0000 mg | ORAL_CAPSULE | Freq: Every day | ORAL | Status: DC
  Administered 2014-09-16 – 2014-09-18 (×3): 360 mg via ORAL
  Filled 2014-09-15 (×3): qty 1

## 2014-09-15 MED ORDER — ONDANSETRON HCL 4 MG/2ML IJ SOLN
INTRAMUSCULAR | Status: DC | PRN
  Administered 2014-09-15: 4 mg via INTRAVENOUS

## 2014-09-15 MED ORDER — HYDROMORPHONE HCL 1 MG/ML IJ SOLN
0.2500 mg | INTRAMUSCULAR | Status: DC | PRN
  Administered 2014-09-15 (×4): 0.5 mg via INTRAVENOUS

## 2014-09-15 MED ORDER — INSULIN ASPART 100 UNIT/ML ~~LOC~~ SOLN
4.0000 [IU] | Freq: Three times a day (TID) | SUBCUTANEOUS | Status: DC
  Administered 2014-09-15 – 2014-09-17 (×3): 4 [IU] via SUBCUTANEOUS

## 2014-09-15 MED ORDER — METOCLOPRAMIDE HCL 5 MG PO TABS: 5.0000 mg | ORAL_TABLET | Freq: Three times a day (TID) | ORAL | Status: DC | PRN

## 2014-09-15 SURGICAL SUPPLY — 40 items
BLADE SURG 10 STRL SS (BLADE) IMPLANT
BNDG COHESIVE 4X5 TAN STRL (GAUZE/BANDAGES/DRESSINGS) IMPLANT
BNDG COHESIVE 6X5 TAN STRL LF (GAUZE/BANDAGES/DRESSINGS) IMPLANT
CUFF TOURNIQUET SINGLE 18IN (TOURNIQUET CUFF) IMPLANT
DRAPE U-SHAPE 47X51 STRL (DRAPES) ×3 IMPLANT
DRSG ADAPTIC 3X8 NADH LF (GAUZE/BANDAGES/DRESSINGS) ×3 IMPLANT
DRSG MEPITEL 4X7.2 (GAUZE/BANDAGES/DRESSINGS) ×2 IMPLANT
DRSG VAC ATS SM SENSATRAC (GAUZE/BANDAGES/DRESSINGS) ×2 IMPLANT
DURAPREP 26ML APPLICATOR (WOUND CARE) ×3 IMPLANT
ELECT CAUTERY BLADE 6.4 (BLADE) IMPLANT
ELECT REM PT RETURN 9FT ADLT (ELECTROSURGICAL)
ELECTRODE REM PT RTRN 9FT ADLT (ELECTROSURGICAL) IMPLANT
GAUZE SPONGE 4X4 12PLY STRL (GAUZE/BANDAGES/DRESSINGS) ×3 IMPLANT
GLOVE BIOGEL PI IND STRL 9 (GLOVE) ×1 IMPLANT
GLOVE BIOGEL PI INDICATOR 9 (GLOVE) ×2
GLOVE SURG ORTHO 9.0 STRL STRW (GLOVE) ×3 IMPLANT
GOWN STRL REUS W/ TWL XL LVL3 (GOWN DISPOSABLE) ×2 IMPLANT
GOWN STRL REUS W/TWL XL LVL3 (GOWN DISPOSABLE) ×6
GRAFT TISS THERASKIN 2X3 (Tissue) IMPLANT
HANDPIECE INTERPULSE COAX TIP (DISPOSABLE)
KIT BASIN OR (CUSTOM PROCEDURE TRAY) ×3 IMPLANT
KIT ROOM TURNOVER OR (KITS) ×3 IMPLANT
MANIFOLD NEPTUNE II (INSTRUMENTS) ×3 IMPLANT
NS IRRIG 1000ML POUR BTL (IV SOLUTION) ×3 IMPLANT
PACK ORTHO EXTREMITY (CUSTOM PROCEDURE TRAY) ×3 IMPLANT
PAD ARMBOARD 7.5X6 YLW CONV (MISCELLANEOUS) ×6 IMPLANT
PAD NEG PRESSURE SENSATRAC (MISCELLANEOUS) ×2 IMPLANT
PADDING CAST COTTON 6X4 STRL (CAST SUPPLIES) ×3 IMPLANT
SET HNDPC FAN SPRY TIP SCT (DISPOSABLE) IMPLANT
SPONGE LAP 18X18 X RAY DECT (DISPOSABLE) ×3 IMPLANT
STOCKINETTE IMPERVIOUS 9X36 MD (GAUZE/BANDAGES/DRESSINGS) IMPLANT
TISSUE THERASKIN 2X3 (Tissue) ×3 IMPLANT
TOWEL OR 17X24 6PK STRL BLUE (TOWEL DISPOSABLE) ×3 IMPLANT
TOWEL OR 17X26 10 PK STRL BLUE (TOWEL DISPOSABLE) ×3 IMPLANT
TUBE ANAEROBIC SPECIMEN COL (MISCELLANEOUS) IMPLANT
TUBE CONNECTING 12'X1/4 (SUCTIONS) ×1
TUBE CONNECTING 12X1/4 (SUCTIONS) ×2 IMPLANT
UNDERPAD 30X30 INCONTINENT (UNDERPADS AND DIAPERS) ×3 IMPLANT
WATER STERILE IRR 1000ML POUR (IV SOLUTION) ×3 IMPLANT
YANKAUER SUCT BULB TIP NO VENT (SUCTIONS) ×3 IMPLANT

## 2014-09-15 NOTE — Progress Notes (Signed)
Orthopedic Tech Progress Note Patient Details:  CLARANCE BOLLARD 09/28/1945 [id number] Prafo boot applied in PACU. Prafo to be worn 24/7 as ordered. Ortho Devices Type of Ortho Device: Postop shoe/boot, Other (comment) Ortho Device/Splint Location: Prafo L Ortho Device/Splint Interventions: Application   [place] R Thompson 09/15/2014, 3:39 PM

## 2014-09-15 NOTE — Op Note (Signed)
09/15/2014  5:56 PM  PATIENT:  Oscar [name]    PRE-OPERATIVE DIAGNOSIS:  Abscess, Necrotic Achilles Left Foot  POST-OPERATIVE DIAGNOSIS:  Same  PROCEDURE:  Excision Necrotic Left Achilles, Skin Graft, apply Wound VAC  SURGEON:  DUDA,MARCUS V, MD  PHYSICIAN ASSISTANT:None ANESTHESIA:   General  PREOPERATIVE INDICATIONS:  [name] is a  69 y.o. male with a diagnosis of Abscess, Necrotic Achilles Left Foot who failed conservative measures and elected for surgical management.    The risks benefits and alternatives were discussed with the patient preoperatively including but not limited to the risks of infection, bleeding, nerve injury, cardiopulmonary complications, the need for revision surgery, among others, and the patient was willing to proceed.  OPERATIVE IMPLANTS: Theraskin 2 x 3" split-thickness skin graft  OPERATIVE FINDINGS: No deep abscess  OPERATIVE PROCEDURE: Patient was brought to the operating room and underwent a general anesthetic. After adequate levels of anesthesia were obtained patient's left lower extremity was prepped using DuraPrep draped into a sterile field. A timeout was called. The necrotic tissue as well as necrotic Achilles soft tissue and muscle were resected this left a wound which was 2 x 3". This was irrigated with saline. There was good bleeding. The split thickness skin graft was stapled in place. Mepilex dressing was applied and a wound VAC was then applied set to 75 mm of suction. This had a good suction fit. Patient was extubated taken to the PACU in stable condition.

## 2014-09-15 NOTE — Transfer of Care (Signed)
Immediate Anesthesia Transfer of Care Note  Patient: Oscar [name]  Procedure(s) Performed: Procedure(s): Excision Necrotic Left Achilles, Skin Graft, apply Wound VAC (Left)  Patient Location: PACU  Anesthesia Type:General  Level of Consciousness: awake, alert  and oriented  Airway & Oxygen Therapy: Patient Spontanous Breathing  Post-op Assessment: Report given to RN and Post -op Vital signs reviewed and stable  Post vital signs: Reviewed and stable  Last Vitals:  Filed Vitals:   09/15/14 1218  BP: 156/86  Pulse: 83  Temp: 36.6 C  Resp: 18    Complications: No apparent anesthesia complications

## 2014-09-15 NOTE — Anesthesia Preprocedure Evaluation (Signed)
Anesthesia Evaluation  Patient identified by MRN, date of birth, ID band Patient awake    Reviewed: Allergy & Precautions, NPO status , Patient's Chart, lab work & pertinent test results  History of Anesthesia Complications (+) PONV  Airway Mallampati: II   Neck ROM: full    Dental   Pulmonary shortness of breath, sleep apnea ,          Cardiovascular hypertension, + dysrhythmias Atrial Fibrillation     Neuro/Psych TIACVA    GI/Hepatic hiatal hernia, GERD-  ,  Endo/Other  diabetes, Type 2Morbid obesity  Renal/GU      Musculoskeletal  (+) Arthritis -,   Abdominal   Peds  Hematology   Anesthesia Other Findings   Reproductive/Obstetrics                             Anesthesia Physical Anesthesia Plan  ASA: III  Anesthesia Plan: General   Post-op Pain Management:    Induction: Intravenous  Airway Management Planned: Oral ETT  Additional Equipment:   Intra-op Plan:   Post-operative Plan: Extubation in OR  Informed Consent: I have reviewed the patients History and Physical, chart, labs and discussed the procedure including the risks, benefits and alternatives for the proposed anesthesia with the patient or authorized representative who has indicated his/her understanding and acceptance.     Plan Discussed with: CRNA, Anesthesiologist and Surgeon  Anesthesia Plan Comments:         Anesthesia Quick Evaluation

## 2014-09-15 NOTE — H&P (Signed)
Oscar [name] is an 69 y.o. male.   Chief Complaint: Gangrenous ulcer left Achilles HPI: Patient is Oscar [name] peripheral vascular disease diabetes who has Oscar decubitus Achilles ulcer which has failed conservative wound care in the office and presents at this time for further debridement and skin grafting.  Past Medical History  Diagnosis Date  . Diabetes mellitus without complication     improved after diet/exercise  . Hypertension     improved after diet/exercise  . Kidney stones   . GERD (gastroesophageal reflux disease)   . H/O hiatal hernia   . Arthritis     hips  . OSA (obstructive sleep apnea)     wears cpap  . Anal fissure     h/o - no recent complications  . Atrial fibrillation   . Gout   . Stroke 08/2013    short term memory loss (slight)  . Hx of typhoid fever     as Oscar child  . Enlarged prostate   . PONV (postoperative nausea and vomiting)     also difficulty waking up    Past Surgical History  Procedure Laterality Date  . Cholecystectomy    . Knee arthroscopy Bilateral   . Carpel tunnel Bilateral   . Ligament repair    . Kidney stone surgery    . Tonsillectomy    . Eye surgery      laser surgery   . Colonoscopy    . Vasectomy      Family History  Problem Relation Age of Onset  . COPD Father   . Heart attack Sister     died age 36 of MI   Social History:  reports that he has never smoked. He has never used smokeless tobacco. He reports that he drinks alcohol. He reports that he does not use illicit drugs.  Allergies:  Allergies  Allergen Reactions  . Iohexol      Desc: HIVES S/P 13HR.PREMEDS,PT WEIGHS 367LBS,?LOW DOSAGE PREMEDS   . Ivp Dye [Iodinated Diagnostic Agents]     welps     No prescriptions prior to admission    No results found for this or any previous visit (from the past 63 hour(s)). No results found.  Review of Systems  All other systems reviewed and are negative.   There were no vitals taken for this  visit. Physical Exam  On examination patient is Oscar large gangrenous ulcer over the left Achilles tendon. Assessment/Plan Assessment: Gangrenous decubitus left Achilles ulcer with diabetic insensate neuropathy.  Plan: We'll plan for further debridement of the Achilles ulcer placement of skin graft placement of Oscar wound VAC. Risks of wound healing potential amputation were discussed. Patient states he understands and wishes to proceed at this time.  Oscar [name] V 09/15/2014, 6:31 AM

## 2014-09-16 LAB — GLUCOSE, CAPILLARY
GLUCOSE-CAPILLARY: 99 mg/dL (ref 70–99)
GLUCOSE-CAPILLARY: 139 mg/dL — AB (ref 70–99)
Glucose-Capillary: 93 mg/dL (ref 70–99)
Glucose-Capillary: 100 mg/dL — ABNORMAL HIGH (ref 70–99)

## 2014-09-16 MED ORDER — ATORVASTATIN CALCIUM 10 MG PO TABS
10.0000 mg | ORAL_TABLET | Freq: Every day | ORAL | Status: DC
  Administered 2014-09-16 – 2014-09-17 (×2): 10 mg via ORAL
  Filled 2014-09-16 (×3): qty 1

## 2014-09-16 NOTE — Progress Notes (Signed)
Patient ID: Oscar [name], male   DOB: 25-Dec-1945, 69 y.o.   MRN: [id number] Postoperative day 1 status post application of split thickness skin graft to Achilles ulcer and application of wound VAC. Plan to continue wound VAC through Monday.

## 2014-09-16 NOTE — Evaluation (Signed)
Physical Therapy Evaluation Patient Details Name: Oscar [name] MRN: [id number] DOB: 1945-10-01 Today's Date: 09/16/2014   History of Present Illness  Pt admit with left heel ulcer with VAC in place.    Clinical Impression  Pt admitted with above diagnosis. Pt currently with functional limitations due to the deficits listed below (see PT Problem List). Pt ambulated fairly well with RW with good safety.  Should do well at home with wife.   Pt will benefit from skilled PT to increase their independence and safety with mobility to allow discharge to the venue listed below.      Follow Up Recommendations Home health PT;Supervision/Assistance - 24 hour    Equipment Recommendations  None recommended by PT    Recommendations for Other Services       Precautions / Restrictions Precautions Precautions: Fall Precaution Comments: L PF AFO all times Restrictions Weight Bearing Restrictions: Yes LLE Weight Bearing: Touchdown weight bearing      Mobility  Bed Mobility Overal bed mobility: Modified Independent Bed Mobility: Supine to Sit     Supine to sit: Min assist     General bed mobility comments: able to self perform with increased time and use of rail  Transfers Overall transfer level: Needs assistance Equipment used: Rolling walker (2 wheeled) Transfers: Sit to/from [facility] Sit to Stand: Min guard;From elevated surface Stand pivot transfers: Min guard;+2 safety/equipment       General transfer comment: Pt cued to push up from bed however pt pulled up on Rw.  Able to maintain TDWB for a few steps toward the recliner.  Pt with good walker safety overall.  Did not want to get oOB so only agreeable to get to chair today.   Ambulation/Gait Ambulation/Gait assistance: Min guard;Min assist Ambulation Distance (Feet): 5 Feet Assistive device: Rolling walker (2 wheeled) Gait Pattern/deviations: Step-to pattern;Antalgic;Wide base of support Gait velocity:  decreased Gait velocity interpretation: Below normal speed for age/gender General Gait Details: Fairly steady with RW to take a few steps to chair.  TDWB left LE maintained.    Stairs            Wheelchair Mobility    Modified Rankin (Stroke Patients Only)       Balance Overall balance assessment: Needs assistance;History of Falls         Standing balance support: Bilateral upper extremity supported;During functional activity Standing balance-Leahy Scale: Poor Standing balance comment: requires use of Rw for UE support.                              Pertinent Vitals/Pain Pain Assessment: 0-10 Pain Score: 8  Pain Location: L heel Pain Descriptors / Indicators: Aching Pain Intervention(s): Limited activity within patient's tolerance;Monitored during session;Premedicated before session;Repositioned  VSS    Home Living Family/patient expects to be discharged to:: Private residence Living Arrangements: Spouse/significant other;Children Available Help at Discharge: Family;Available 24 hours/day Type of Home: House Home Access: Stairs to enter Entrance Stairs-Rails: Right;Left;Can reach both Entrance Stairs-Number of Steps: 4 Home Layout: Able to live on main level with bedroom/bathroom Home Equipment: Cane - single point;Shower seat;Hand held Tourist information centre manager - 2 wheels (handicapped height toilet)      Prior Function Level of Independence: Independent;Needs assistance   Gait / Transfers Assistance Needed: Ambulated with RW.  ADL's / Homemaking Assistance Needed: has needed more assist in the last few days wwith ADL.  Hand Dominance        Extremity/Trunk Assessment   Upper Extremity Assessment: Defer to OT evaluation           Lower Extremity Assessment: LLE deficits/detail   LLE Deficits / Details: limited DF actively during session observed, pt reporting pain with stretch on Achilles tendon due to blister/wound area, also with  what appears to be plantar surface 1st metarsal head area wound     Communication   Communication: No difficulties  Cognition Arousal/Alertness: Awake/alert Behavior During Therapy: WFL for tasks assessed/performed Overall Cognitive Status: Within Functional Limits for tasks assessed                      General Comments General comments (skin integrity, edema, etc.): VAC in place on left LE.    Exercises        Assessment/Plan    PT Assessment Patient needs continued PT services  PT Diagnosis Difficulty walking;Acute pain   PT Problem List Decreased balance;Decreased mobility;Decreased knowledge of use of DME;Decreased safety awareness;Decreased knowledge of precautions;Pain  PT Treatment Interventions DME instruction;Gait training;Functional mobility training;Therapeutic activities;Therapeutic exercise;Patient/family education;Stair training   PT Goals (Current goals can be found in the Care Plan section) Acute Rehab PT Goals Patient Stated Goal: to decrease pain PT Goal Formulation: With patient Time For Goal Achievement: 09/23/14 Potential to Achieve Goals: Good    Frequency Min 3X/week   Barriers to discharge        Co-evaluation               End of Session Equipment Utilized During Treatment: Gait belt Activity Tolerance: Patient limited by fatigue;Patient limited by pain Patient left: in chair;with call bell/phone within reach;with family/visitor present Nurse Communication: Mobility status         Time: 2330-0762 PT Time Calculation (min) (ACUTE ONLY): 14 min   Charges:   PT Evaluation $Initial PT Evaluation Tier I: 1 Procedure     PT G CodesDenice Paradise 09/23/14, 3:35 PM [facility] [phone] (pager)

## 2014-09-17 LAB — GLUCOSE, CAPILLARY
GLUCOSE-CAPILLARY: 104 mg/dL — AB (ref 70–99)
Glucose-Capillary: 95 mg/dL (ref 70–99)
Glucose-Capillary: 101 mg/dL — ABNORMAL HIGH (ref 70–99)
Glucose-Capillary: 123 mg/dL — ABNORMAL HIGH (ref 70–99)

## 2014-09-17 NOTE — Progress Notes (Signed)
Patient ID: Oscar [name], male   DOB: [birth year], 69 y.o.   MRN: [id number] Postoperative day 2 status post application of skin graft and wound VAC for left heel ulcer. Plan for discharge to home on Monday with removal of the wound VAC.

## 2014-09-17 NOTE — Progress Notes (Signed)
Utilization review completed.  

## 2014-09-18 ENCOUNTER — Encounter (HOSPITAL_COMMUNITY): Payer: Self-pay | Admitting: Orthopedic Surgery

## 2014-09-18 LAB — CBC
HEMATOCRIT: 31.4 % — AB (ref 39.0–52.0)
Hemoglobin: 9.5 g/dL — ABNORMAL LOW (ref 13.0–17.0)
MCH: 24.8 pg — AB (ref 26.0–34.0)
MCHC: 30.3 g/dL (ref 30.0–36.0)
MCV: 82 fL (ref 78.0–100.0)
PLATELETS: 235 10*3/uL (ref 150–400)
RBC: 3.83 MIL/uL — ABNORMAL LOW (ref 4.22–5.81)
RDW: 15.5 % (ref 11.5–15.5)
WBC: 7.4 10*3/uL (ref 4.0–10.5)

## 2014-09-18 LAB — GLUCOSE, CAPILLARY: GLUCOSE-CAPILLARY: 98 mg/dL (ref 70–99)

## 2014-09-18 MED ORDER — HYDROCODONE-ACETAMINOPHEN 5-325 MG PO TABS: 1.0000 | ORAL_TABLET | ORAL | Status: DC | PRN

## 2014-09-18 NOTE — Progress Notes (Signed)
PT Cancellation Note  Patient Details Name: Oscar [name] MRN: [id number] DOB: March 14, 1946   Cancelled Treatment:    Reason Eval/Treat Not Completed: Medical issues which prohibited therapy   Pt had IV removed just prior to arrival. Pt on blood thinners and bleeding profusely from former IV site. RN attending to pt. Discussed pt's mobility status with pt and wife. She is very concerned re: his ability to ambulate and get up 4 steps while maintaining TDWB.   Will return after hand has stopped bleeding and can tolerate nearly full weight-bearing through his hands on walker. RN aware pt must work with PT prior to d/c.   [name] 09/18/2014, 11:18 AM  Pager [phone]

## 2014-09-18 NOTE — Discharge Summary (Signed)
Physician Discharge Summary  Patient ID: Oscar [name] MRN: [id number] DOB/AGE: 1945/10/08 69 y.o.  Admit date: 09/15/2014 Discharge date: 09/18/2014  Admission Diagnoses: Decubitus left heel ulcer secondary to diabetes  Discharge Diagnoses:   Active Problems:   Heel ulcer due to DM   Discharged Condition: stable  Hospital Course: Patient's hospital course was essentially unremarkable. He underwent excision of necrotic Achilles and soft tissue placement of skin graft placement of a wound VAC. And IV antibiotics.  Consults: None  Significant Diagnostic Studies: labs: Routine labs surgery: See operative note  Treatments: surgery: See operative note  Discharge Exam: Blood pressure 105/61, pulse 74, temperature 98.3 F (36.8 C), temperature source Oral, resp. rate 16, height _0  (1.753 m), weight 130 kg (286 lb 9.6 oz), SpO2 97 %. Incision/Wound: skin graft clean and dry healthy and viable. No cellulitis.  Disposition: 06-Home-Health Care Svc  Discharge Instructions    Call MD / Call 911    Complete by:  As directed   If you experience chest pain or shortness of breath, CALL 911 and be transported to the hospital emergency room.  If you develope a fever above 101 F, pus (white drainage) or increased drainage or redness at the wound, or calf pain, call your surgeon's office.     Constipation Prevention    Complete by:  As directed   Drink plenty of fluids.  Prune juice may be helpful.  You may use a stool softener, such as Colace (over the counter) 100 mg twice a day.  Use MiraLax (over the counter) for constipation as needed.     Diet - low sodium heart healthy    Complete by:  As directed      Increase activity slowly as tolerated    Complete by:  As directed             Medication List    STOP taking these medications        cephALEXin 500 MG capsule  Commonly known as:  KEFLEX     collagenase ointment  Commonly known as:  SANTYL     doxycycline 100 MG tablet   Commonly known as:  VIBRA-TABS      TAKE these medications        acetaminophen 500 MG tablet  Commonly known as:  TYLENOL  Take 1,500 mg by mouth every 6 (six) hours as needed for moderate pain.     apixaban 5 MG Tabs tablet  Commonly known as:  ELIQUIS  Take 1 tablet (5 mg total) by mouth 2 (two) times daily.     blood glucose meter kit and supplies Kit  Dispense based on patient and insurance preference. Use up to four times daily as directed. (FOR ICD-9 250.00, 250.01).     diltiazem 360 MG 24 hr capsule  Commonly known as:  CARDIZEM CD  Take 1 capsule (360 mg total) by mouth daily.     escitalopram 10 MG tablet  Commonly known as:  LEXAPRO  Take 10 mg by mouth at bedtime.     febuxostat 40 MG tablet  Commonly known as:  ULORIC  Take 40 mg by mouth daily.     furosemide 40 MG tablet  Commonly known as:  LASIX  TAKE 1 TABLET BY MOUTH DAILY     HYDROcodone-acetaminophen 5-325 MG per tablet  Commonly known as:  NORCO/VICODIN  Take 1 tablet by mouth every 4 (four) hours as needed for moderate pain.     indomethacin 50 MG  capsule  Commonly known as:  INDOCIN  Take 50 mg by mouth 3 (three) times daily as needed (for gout flare ups).     insulin aspart 100 UNIT/ML injection  Commonly known as:  novoLOG  Inject 0-9 Units into the skin 3 (three) times daily with meals.     insulin glargine 100 UNIT/ML injection  Commonly known as:  LANTUS  Inject 0.2 mLs (20 Units total) into the skin at bedtime.     metFORMIN 1000 MG tablet  Commonly known as:  GLUCOPHAGE  Take 1,000 mg by mouth 2 (two) times daily with a meal.     pantoprazole 40 MG tablet  Commonly known as:  PROTONIX  Take 1 tablet (40 mg total) by mouth daily.     potassium chloride SA 20 MEQ tablet  Commonly known as:  K-DUR,KLOR-CON  TAKE 1 TABLET BY MOUTH DAILY     simvastatin 20 MG tablet  Commonly known as:  ZOCOR  Take 20 mg by mouth daily.     tamsulosin 0.4 MG Caps capsule  Commonly known  as:  FLOMAX  Take 1 capsule (0.4 mg total) by mouth daily after breakfast.           Follow-up Information    Follow up with Kharter Brew V, MD In 1 week.   Specialty:  Orthopedic Surgery   Contact information:   [place] [address] [phone]       Signed: Newt Minion 09/18/2014, 6:52 AM

## 2014-09-18 NOTE — Progress Notes (Signed)
Physical Therapy Treatment Patient Details Name: Oscar [name] MRN: [id number] DOB: January 26, 1946 Today's Date: 09/18/2014    History of Present Illness Pt admit with left heel ulcer with VAC in place.      PT Comments    Wife present throughout session and assisted with stair training. Pt demonstrated ability to maintain TDWB on LLE while ambulating and ? on stairs (pt would not allow PT to feel pressure he applied to LLE while on stairs). Both acknowledge no further questions and plan to d/c home later today.    Follow Up Recommendations  Home health PT;Supervision for mobility/OOB     Equipment Recommendations  None recommended by PT    Recommendations for Other Services       Precautions / Restrictions Precautions Precautions: Fall Precaution Comments: L PF AFO all times Restrictions Weight Bearing Restrictions: Yes LLE Weight Bearing: Touchdown weight bearing    Mobility  Bed Mobility Overal bed mobility: Modified Independent Bed Mobility: Supine to Sit     Supine to sit: Min assist;Modified independent (Device/Increase time)     General bed mobility comments: able to self perform with increased time and use of rail  Transfers Overall transfer level: Needs assistance Equipment used: Rolling walker (2 wheeled) Transfers: Sit to/from Stand Sit to Stand: Min guard         General transfer comment: repeated x 3; vc initially for proper use of RW; pt prefers to use one hand on RW and one on surface (wife educated to hold RW stable)  Ambulation/Gait Ambulation/Gait assistance: Min guard Ambulation Distance (Feet): 25 Feet Assistive device: Rolling walker (2 wheeled) Gait Pattern/deviations: Step-to pattern Gait velocity: decreased   General Gait Details: Steady with RW; able to place PT's foot under his LLE and pt maintains TDWB (checked again when fatigued)   Stairs Stairs: Yes Stairs assistance: Min guard Stair Management: Two rails;Step to  pattern;Forwards Number of Stairs: 2 General stair comments: Pt/wife educated on use of one rail and RW open on his Rt. (They both feel the RW will work this way on their steps). Practice steps too narrow for this method and pt used 2 rails to simulate. Pt/wife then taken into stairwell and PT demonstrated technique up/down 4 steps with Lt rail and wife supporting RW on his Rt. Pt deferred practicing on these steps "I'll be too tired to go home" and wife stated she felt comfortable.   Wheelchair Mobility    Modified Rankin (Stroke Patients Only)       Balance                                    Cognition Arousal/Alertness: Awake/alert Behavior During Therapy: WFL for tasks assessed/performed Overall Cognitive Status: Within Functional Limits for tasks assessed                      Exercises      General Comments General comments (skin integrity, edema, etc.): Educated on importance of elevating LLE to manage edema and how edema can delay healing. No bleeding noted from pt's prior IV site in Lt hand.      Pertinent Vitals/Pain Pain Assessment: No/denies pain    Home Living           Entrance Stairs-Rails: Right;Left   Home Equipment: Shower seat;Hand held Tourist information centre manager - 2 wheels      Prior Function  PT Goals (current goals can now be found in the care plan section) Acute Rehab PT Goals Patient Stated Goal: to decrease pain Progress towards PT goals: Progressing toward goals    Frequency       PT Plan Current plan remains appropriate    Co-evaluation             End of Session Equipment Utilized During Treatment:  (pt dressed with waist belt) Activity Tolerance: Patient tolerated treatment well Patient left: in chair;with call bell/phone within reach;with family/visitor present     Time: 1140 (time adjusted to account for 1100-1113 visit)-1223 PT Time Calculation (min) (ACUTE ONLY): 43 min  Charges:  $Gait  Training: 38-52 mins                    G Codes:      Oscar [name] 10/15/2014, 12:36 PM Pager [phone]

## 2014-09-18 NOTE — Care Management Note (Signed)
  Page 1 of 1   09/18/2014     11:38:14 AM CARE MANAGEMENT NOTE 09/18/2014  Patient:  FARREL, GUIMOND   Account Number:  [id number]  Date Initiated:  09/18/2014  Documentation initiated by:  Magdalen Spatz  Subjective/Objective Assessment:     Action/Plan:   Anticipated DC Date:  09/18/2014   Anticipated DC Plan:  [place]         Choice offered to / List presented to:  C-1 Patient        [place] arranged  [place] PT      [place].   Status of service:  Completed, signed off Medicare Important Message given?  YES (If response is "NO", the following Medicare IM given date fields will be blank) Date Medicare IM given:  09/18/2014 Medicare IM given by:  Magdalen Spatz Date Additional Medicare IM given:   Additional Medicare IM given by:    Discharge Disposition:    Per UR Regulation:    If discussed at Long Length of Stay Meetings, dates discussed:    Comments:  Face sheet information confirmed with wife her cell number is [phone]. Magdalen Spatz RN BSN

## 2014-09-18 NOTE — Progress Notes (Signed)
D/c to home with wife. Instructions and meds given and understood. VSS. Breathing regular and unlagored on room air. VSS

## 2014-09-19 DIAGNOSIS — Z794 Long term (current) use of insulin: Secondary | ICD-10-CM | POA: Diagnosis not present

## 2014-09-19 DIAGNOSIS — Z4800 Encounter for change or removal of nonsurgical wound dressing: Secondary | ICD-10-CM | POA: Diagnosis not present

## 2014-09-19 DIAGNOSIS — E1165 Type 2 diabetes mellitus with hyperglycemia: Secondary | ICD-10-CM | POA: Diagnosis not present

## 2014-09-19 DIAGNOSIS — L03116 Cellulitis of left lower limb: Secondary | ICD-10-CM | POA: Diagnosis not present

## 2014-09-19 DIAGNOSIS — S90522D Blister (nonthermal), left ankle, subsequent encounter: Secondary | ICD-10-CM | POA: Diagnosis not present

## 2014-09-19 LAB — GLUCOSE, CAPILLARY: Glucose-Capillary: 130 mg/dL — ABNORMAL HIGH (ref 70–99)

## 2014-09-19 NOTE — Anesthesia Postprocedure Evaluation (Signed)
Anesthesia Post Note  Patient: Oscar [name]  Procedure(s) Performed: Procedure(s) (LRB): Excision Necrotic Left Achilles, Skin Graft, apply Wound VAC (Left)  Anesthesia type: General  Patient location: PACU  Post pain: Pain level controlled and Adequate analgesia  Post assessment: Post-op Vital signs reviewed, Patient's Cardiovascular Status Stable, Respiratory Function Stable, Patent Airway and Pain level controlled  Last Vitals:  Filed Vitals:   09/18/14 0631  BP: 105/61  Pulse: 74  Temp: 36.8 C  Resp: 16    Post vital signs: Reviewed and stable  Level of consciousness: awake, alert  and oriented  Complications: No apparent anesthesia complications

## 2014-09-22 DIAGNOSIS — S90522D Blister (nonthermal), left ankle, subsequent encounter: Secondary | ICD-10-CM | POA: Diagnosis not present

## 2014-09-22 DIAGNOSIS — E1165 Type 2 diabetes mellitus with hyperglycemia: Secondary | ICD-10-CM | POA: Diagnosis not present

## 2014-09-22 DIAGNOSIS — Z794 Long term (current) use of insulin: Secondary | ICD-10-CM | POA: Diagnosis not present

## 2014-09-22 DIAGNOSIS — L03116 Cellulitis of left lower limb: Secondary | ICD-10-CM | POA: Diagnosis not present

## 2014-09-22 DIAGNOSIS — Z4800 Encounter for change or removal of nonsurgical wound dressing: Secondary | ICD-10-CM | POA: Diagnosis not present

## 2014-09-26 ENCOUNTER — Ambulatory Visit: Payer: BC Managed Care – PPO | Admitting: Internal Medicine

## 2014-10-23 ENCOUNTER — Other Ambulatory Visit: Payer: Self-pay

## 2014-10-24 ENCOUNTER — Ambulatory Visit (INDEPENDENT_AMBULATORY_CARE_PROVIDER_SITE_OTHER): Payer: BLUE CROSS/BLUE SHIELD | Admitting: Internal Medicine

## 2014-10-24 ENCOUNTER — Other Ambulatory Visit: Payer: Self-pay

## 2014-10-24 ENCOUNTER — Encounter: Payer: Self-pay | Admitting: Internal Medicine

## 2014-10-24 VITALS — BP 136/64 | HR 76 | Ht 69.0 in | Wt 279.6 lb

## 2014-10-24 DIAGNOSIS — G4730 Sleep apnea, unspecified: Secondary | ICD-10-CM

## 2014-10-24 DIAGNOSIS — I10 Essential (primary) hypertension: Secondary | ICD-10-CM

## 2014-10-24 DIAGNOSIS — I4891 Unspecified atrial fibrillation: Secondary | ICD-10-CM

## 2014-10-24 DIAGNOSIS — I5032 Chronic diastolic (congestive) heart failure: Secondary | ICD-10-CM

## 2014-10-24 NOTE — Assessment & Plan Note (Signed)
His blood pressure is reasonably well controlled. We discussed the importance of a low sodium diet, and weight loss. He will continue his current meds.

## 2014-10-24 NOTE — Assessment & Plan Note (Signed)
His ventricular rate is under better control. He will continue his current meds.

## 2014-10-24 NOTE — Patient Instructions (Signed)
Your physician wants you to follow-up in: 12 months with Dr Knox Saliva will receive a reminder letter in the mail two months in advance. If you don't receive a letter, please call our office to schedule the follow-up appointment.  You have been referred to Dr. Gwenette Greet for CPAP follow up

## 2014-10-24 NOTE — Progress Notes (Signed)
HPI Oscar [name] returns today for followup. He is a very pleasant 69 year old man with morbid obesity and hypertension and diabetes.  He has had problems with cellulitis in his right foot and has undergone surgical debridement. He is now undergoing wound therapy.  He denies chest pain or syncope. No palpitations. He has mild dyspnea with exertion. Overall he feels better. His wife is doing the cooking and he has actually lost weight.  Allergies  Allergen Reactions  . Iohexol      Desc: HIVES S/P 13HR.PREMEDS, ?LOW DOSAGE PREMEDS   . Ivp Dye [Iodinated Diagnostic Agents]     welps      Current Outpatient Prescriptions  Medication Sig Dispense Refill  . acetaminophen (TYLENOL) 500 MG tablet Take 1,500 mg by mouth every 6 (six) hours as needed for moderate pain.    Marland Kitchen apixaban (ELIQUIS) 5 MG TABS tablet Take 1 tablet (5 mg total) by mouth 2 (two) times daily. 60 tablet 1  . blood glucose meter kit and supplies KIT Dispense based on patient and insurance preference. Use up to four times daily as directed. (FOR ICD-9 250.00, 250.01). 1 each 0  . diltiazem (CARDIZEM CD) 360 MG 24 hr capsule Take 1 capsule (360 mg total) by mouth daily. 90 capsule 0  . escitalopram (LEXAPRO) 10 MG tablet Take 10 mg by mouth at bedtime.     . febuxostat (ULORIC) 40 MG tablet Take 40 mg by mouth daily.    . furosemide (LASIX) 40 MG tablet TAKE 1 TABLET BY MOUTH DAILY 90 tablet 0  . HYDROcodone-acetaminophen (NORCO/VICODIN) 5-325 MG per tablet Take 1 tablet by mouth every 4 (four) hours as needed for moderate pain. 40 tablet 0  . indomethacin (INDOCIN) 50 MG capsule Take 50 mg by mouth 3 (three) times daily as needed (for gout flare ups).     . insulin aspart (NOVOLOG) 100 UNIT/ML injection Inject 0-9 Units into the skin 3 (three) times daily as needed for high blood sugar.    . insulin glargine (LANTUS) 100 UNIT/ML injection Inject 0.2 mLs (20 Units total) into the skin at bedtime. 10 mL 11  . metFORMIN (GLUCOPHAGE)  1000 MG tablet Take 1,000 mg by mouth 2 (two) times daily with a meal.    . pantoprazole (PROTONIX) 40 MG tablet Take 1 tablet (40 mg total) by mouth daily. 30 tablet 1  . potassium chloride SA (K-DUR,KLOR-CON) 20 MEQ tablet TAKE 1 TABLET BY MOUTH DAILY 90 tablet 0  . simvastatin (ZOCOR) 20 MG tablet Take 20 mg by mouth daily.     . tamsulosin (FLOMAX) 0.4 MG CAPS capsule Take 1 capsule (0.4 mg total) by mouth daily after breakfast. 30 capsule 0   No current facility-administered medications for this visit.     Past Medical History  Diagnosis Date  . Diabetes mellitus without complication     improved after diet/exercise  . Hypertension     improved after diet/exercise  . Kidney stones   . GERD (gastroesophageal reflux disease)   . H/O hiatal hernia   . Arthritis     hips  . OSA (obstructive sleep apnea)     wears cpap  . Anal fissure     h/o - no recent complications  . Atrial fibrillation   . Gout   . Stroke 08/2013    short term memory loss (slight)  . Hx of typhoid fever     as a child  . Enlarged prostate   . PONV (postoperative nausea and  vomiting)     also difficulty waking up    ROS:   All systems reviewed and negative except as noted in the HPI.   Past Surgical History  Procedure Laterality Date  . Cholecystectomy    . Knee arthroscopy Bilateral   . Carpel tunnel Bilateral   . Ligament repair    . Kidney stone surgery    . Tonsillectomy    . Eye surgery      laser surgery   . Colonoscopy    . Vasectomy    . I&d extremity Left 09/15/2014    Procedure: Excision Necrotic Left Achilles, Skin Graft, apply Wound VAC;  Surgeon: Newt Minion, MD;  Location: [facility];  Service: Orthopedics;  Laterality: Left;     Family History  Problem Relation Age of Onset  . COPD Father   . Heart attack Sister     died age 69 of MI     History   Social History  . Marital Status: Married    Spouse Name: N/A  . Number of Children: N/A  . Years of Education: N/A    Occupational History  . Not on file.   Social History Main Topics  . Smoking status: Never Smoker   . Smokeless tobacco: Never Used  . Alcohol Use: Yes     Comment: rare - 1-2x /yr  . Drug Use: No  . Sexual Activity: Not on file   Other Topics Concern  . Not on file   Social History Narrative     BP 136/64 mmHg  Pulse 76  Ht $R'5\' 9"'nP$  (1.753 m)  Wt 279 lb 9.6 oz (126.826 kg)  BMI 41.27 kg/m2  Physical Exam:  Morbidly obese appearing 69 year old man, NAD HEENT: Unremarkable Neck:  7 cm JVD, no thyromegally Back:  No CVA tenderness Lungs:  Clear with no wheezes, rales, or rhonchi. HEART:  IRegular rate rhythm, no murmurs, no rubs, no clicks Abd:  soft, obese, positive bowel sounds, no organomegally, no rebound, no guarding Ext:  2 plus pulses,  Trace peripheral edema, no cyanosis, no clubbing Skin:  No rashes no nodules Neuro:  CN II through XII intact, motor grossly intact  EKG - atrial fibrillation with a controlled VR.   Assess/Plan:

## 2014-10-24 NOTE — Assessment & Plan Note (Signed)
The importance of weight loss has been reviewed. He states he will try and do better with his diet.

## 2014-10-24 NOTE — Assessment & Plan Note (Signed)
His heart failure symptoms are class 2. He will reduce his sodium intake and attempt to lose weight. He will continue his current medical therapy.

## 2014-11-05 ENCOUNTER — Other Ambulatory Visit: Payer: Self-pay | Admitting: Internal Medicine

## 2014-11-21 ENCOUNTER — Encounter (INDEPENDENT_AMBULATORY_CARE_PROVIDER_SITE_OTHER): Payer: Medicare Other | Admitting: Ophthalmology

## 2014-11-21 DIAGNOSIS — I10 Essential (primary) hypertension: Secondary | ICD-10-CM

## 2014-11-21 DIAGNOSIS — E11319 Type 2 diabetes mellitus with unspecified diabetic retinopathy without macular edema: Secondary | ICD-10-CM

## 2014-11-21 DIAGNOSIS — E11339 Type 2 diabetes mellitus with moderate nonproliferative diabetic retinopathy without macular edema: Secondary | ICD-10-CM

## 2014-11-21 DIAGNOSIS — H43813 Vitreous degeneration, bilateral: Secondary | ICD-10-CM | POA: Diagnosis not present

## 2014-11-21 DIAGNOSIS — H35033 Hypertensive retinopathy, bilateral: Secondary | ICD-10-CM

## 2014-11-21 DIAGNOSIS — E11329 Type 2 diabetes mellitus with mild nonproliferative diabetic retinopathy without macular edema: Secondary | ICD-10-CM | POA: Diagnosis not present

## 2014-12-01 ENCOUNTER — Other Ambulatory Visit: Payer: Self-pay | Admitting: Internal Medicine

## 2014-12-18 ENCOUNTER — Ambulatory Visit (INDEPENDENT_AMBULATORY_CARE_PROVIDER_SITE_OTHER): Payer: Medicare Other | Admitting: Pulmonary Disease

## 2014-12-18 ENCOUNTER — Encounter: Payer: Self-pay | Admitting: Pulmonary Disease

## 2014-12-18 VITALS — BP 122/76 | HR 80 | Temp 97.0°F | Ht 69.0 in | Wt 290.4 lb

## 2014-12-18 DIAGNOSIS — G4733 Obstructive sleep apnea (adult) (pediatric): Secondary | ICD-10-CM

## 2014-12-18 NOTE — Assessment & Plan Note (Signed)
The patient has a history of severe obstructive sleep apnea, and has done exceptionally well on C Pap since his diagnosis. He has a very aged C Pap machine that is having issues, and will need to have a replacement device.  I will order him a new C Pap machine and use the auto setting to optimize his pressure. I've also encouraged him to work aggressively on weight loss.

## 2014-12-18 NOTE — Patient Instructions (Signed)
Will order you a new cpap machine on the auto setting Work on weight loss followup with Dr. Halford Chessman in one year, but call if having issues.

## 2014-12-18 NOTE — Progress Notes (Signed)
Subjective:    Patient ID: Oscar [name], male    DOB: 03/15/1946, 69 y.o.   MRN: [id number]  HPI The patient is a 69 year old male who I've been asked to see for management of obstructive sleep apnea. He was diagnosed with sleep apnea in 1994, and has been on C Pap religiously since that time. He has had a very good response to treatment, and is currently satisfied with his sleep and daytime alertness. He uses nasal pillows, but does not use his heated humidifier. He sleeps well at night and feels rested the following morning. He tells me that his machine is over 26 years old and is starting to have issues, and he will obviously need a replacement. The patient states his weight is down 25 pounds over the last 2 years, and his Epworth score today is only 3   Sleep Questionnaire What time do you typically go to bed?( Between what hours) 10-11p 10-11p at 1547 on 12/18/14 by Inge Rise, CMA How long does it take you to fall asleep? 10-15 min 10-15 min at 1547 on 12/18/14 by Inge Rise, CMA How many times during the night do you wake up? 1 1 at 1547 on 12/18/14 by Inge Rise, CMA What time do you get out of bed to start your day? 0830 0830 at 1547 on 12/18/14 by Inge Rise, CMA Do you drive or operate heavy machinery in your occupation? No No at 1547 on 12/18/14 by Inge Rise, CMA How much has your weight changed (up or down) over the past two years? (In pounds) 25 lb (11.34 kg) 25 lb (11.34 kg) at 1547 on 12/18/14 by Inge Rise, CMA Have you ever had a sleep study before? Yes Yes at 1547 on 12/18/14 by Inge Rise, CMA If yes, [facility]47 on 12/18/14 by Inge Rise, CMA If yes, date of study? 1994 1994 at 1547 on 12/18/14 by Inge Rise, CMA Do you currently use CPAP? Yes Yes at 1547 on 12/18/14 by Inge Rise, CMA If so, what pressure? not sure not sure at 1547 on 12/18/14 by Inge Rise, CMA Do you wear oxygen at any  time? No No at 1547 on 12/18/14 by Inge Rise, CMA   Review of Systems  Constitutional: Negative for fever and unexpected weight change.  HENT: Negative for congestion, dental problem, ear pain, nosebleeds, postnasal drip, rhinorrhea, sinus pressure, sneezing, sore throat and trouble swallowing.   Eyes: Negative for redness and itching.  Respiratory: Positive for cough and shortness of breath. Negative for chest tightness and wheezing.   Cardiovascular: Negative for palpitations.  Gastrointestinal: Negative for nausea and vomiting.  Genitourinary: Negative for dysuria.  Musculoskeletal: Negative for joint swelling.  Skin: Negative for rash.  Neurological: Negative for headaches.  Hematological: Does not bruise/bleed easily.  Psychiatric/Behavioral: Negative for dysphoric mood. The patient is not nervous/anxious.        Objective:   Physical Exam Constitutional:  Obese male, no acute distress  HENT:  Nares patent without discharge  Oropharynx without exudate, palate and uvula are thick and elongated  Eyes:  Perrla, eomi, no scleral icterus  Neck:  No JVD, no TMG  Cardiovascular:  Normal rate, regular rhythm, no rubs or gallops.  No murmurs        Intact distal pulses  Pulmonary :  Normal breath sounds, no stridor or respiratory distress   No rales, rhonchi, or wheezing  Abdominal:  Soft, nondistended, bowel sounds present.  No tenderness noted.   Musculoskeletal:  mild lower extremity edema noted.  Lymph Nodes:  No cervical lymphadenopathy noted  Skin:  No cyanosis noted  Neurologic:  Alert, appropriate, moves all 4 extremities without obvious deficit.         Assessment & Plan:

## 2014-12-19 DIAGNOSIS — E1142 Type 2 diabetes mellitus with diabetic polyneuropathy: Secondary | ICD-10-CM | POA: Diagnosis not present

## 2014-12-19 DIAGNOSIS — M1009 Idiopathic gout, multiple sites: Secondary | ICD-10-CM | POA: Diagnosis not present

## 2014-12-19 DIAGNOSIS — L89523 Pressure ulcer of left ankle, stage 3: Secondary | ICD-10-CM | POA: Diagnosis not present

## 2015-01-02 DIAGNOSIS — L89523 Pressure ulcer of left ankle, stage 3: Secondary | ICD-10-CM | POA: Diagnosis not present

## 2015-01-02 DIAGNOSIS — M1009 Idiopathic gout, multiple sites: Secondary | ICD-10-CM | POA: Diagnosis not present

## 2015-01-11 ENCOUNTER — Other Ambulatory Visit: Payer: Self-pay | Admitting: Pulmonary Disease

## 2015-01-11 DIAGNOSIS — G4733 Obstructive sleep apnea (adult) (pediatric): Secondary | ICD-10-CM

## 2015-01-17 ENCOUNTER — Ambulatory Visit (INDEPENDENT_AMBULATORY_CARE_PROVIDER_SITE_OTHER): Payer: BC Managed Care – PPO | Admitting: Ophthalmology

## 2015-01-26 ENCOUNTER — Other Ambulatory Visit: Payer: Self-pay | Admitting: Pulmonary Disease

## 2015-01-26 DIAGNOSIS — G4733 Obstructive sleep apnea (adult) (pediatric): Secondary | ICD-10-CM

## 2015-01-30 DIAGNOSIS — R05 Cough: Secondary | ICD-10-CM | POA: Diagnosis not present

## 2015-01-30 DIAGNOSIS — Z6841 Body Mass Index (BMI) 40.0 and over, adult: Secondary | ICD-10-CM | POA: Diagnosis not present

## 2015-01-30 DIAGNOSIS — E1142 Type 2 diabetes mellitus with diabetic polyneuropathy: Secondary | ICD-10-CM | POA: Diagnosis not present

## 2015-01-30 DIAGNOSIS — E08621 Diabetes mellitus due to underlying condition with foot ulcer: Secondary | ICD-10-CM | POA: Diagnosis not present

## 2015-01-30 DIAGNOSIS — M1009 Idiopathic gout, multiple sites: Secondary | ICD-10-CM | POA: Diagnosis not present

## 2015-01-30 DIAGNOSIS — E782 Mixed hyperlipidemia: Secondary | ICD-10-CM | POA: Diagnosis not present

## 2015-01-30 DIAGNOSIS — D81819 Biotin-dependent carboxylase deficiency, unspecified: Secondary | ICD-10-CM | POA: Diagnosis not present

## 2015-01-30 DIAGNOSIS — L89523 Pressure ulcer of left ankle, stage 3: Secondary | ICD-10-CM | POA: Diagnosis not present

## 2015-01-30 DIAGNOSIS — Z794 Long term (current) use of insulin: Secondary | ICD-10-CM | POA: Diagnosis not present

## 2015-01-30 DIAGNOSIS — M109 Gout, unspecified: Secondary | ICD-10-CM | POA: Diagnosis not present

## 2015-01-30 DIAGNOSIS — E0840 Diabetes mellitus due to underlying condition with diabetic neuropathy, unspecified: Secondary | ICD-10-CM | POA: Diagnosis not present

## 2015-01-30 DIAGNOSIS — K219 Gastro-esophageal reflux disease without esophagitis: Secondary | ICD-10-CM | POA: Diagnosis not present

## 2015-01-30 DIAGNOSIS — E119 Type 2 diabetes mellitus without complications: Secondary | ICD-10-CM | POA: Diagnosis not present

## 2015-01-30 DIAGNOSIS — E09351 Drug or chemical induced diabetes mellitus with proliferative diabetic retinopathy with macular edema: Secondary | ICD-10-CM | POA: Diagnosis not present

## 2015-02-01 DIAGNOSIS — G4733 Obstructive sleep apnea (adult) (pediatric): Secondary | ICD-10-CM | POA: Diagnosis not present

## 2015-02-07 ENCOUNTER — Telehealth: Payer: Self-pay | Admitting: Pulmonary Disease

## 2015-02-07 DIAGNOSIS — G4733 Obstructive sleep apnea (adult) (pediatric): Secondary | ICD-10-CM | POA: Insufficient documentation

## 2015-02-07 NOTE — Telephone Encounter (Signed)
HST 02/01/15 >> AHI 17.3, SaO2 low 77%.  Will have my nurse inform pt that sleep study shows moderate sleep apnea.  Options are 1) CPAP now, or 2) ROV first.  If he is okay with CPAP, then please send order for auto CPAP range 5 to 15 cm H2O with heated humidity and mask of choice.  Have download sent 1 month after starting CPAP, and ROV 2 months after starting CPAP.

## 2015-02-08 ENCOUNTER — Other Ambulatory Visit: Payer: Self-pay | Admitting: *Deleted

## 2015-02-08 ENCOUNTER — Encounter: Payer: Self-pay | Admitting: Pulmonary Disease

## 2015-02-08 DIAGNOSIS — G4733 Obstructive sleep apnea (adult) (pediatric): Secondary | ICD-10-CM

## 2015-02-08 NOTE — Telephone Encounter (Signed)
Called spoke with pt. Aware of results. He reports he already is on CPAP. He is wanting order placed to get new machine since current one does not work currently. Order placed as stated below. Recall appt placed in system. Nothing further needed

## 2015-02-22 ENCOUNTER — Other Ambulatory Visit: Payer: Self-pay | Admitting: Internal Medicine

## 2015-02-23 ENCOUNTER — Other Ambulatory Visit: Payer: Self-pay

## 2015-02-23 MED ORDER — APIXABAN 5 MG PO TABS: 5.0000 mg | ORAL_TABLET | Freq: Two times a day (BID) | ORAL | Status: DC

## 2015-03-06 DIAGNOSIS — E1142 Type 2 diabetes mellitus with diabetic polyneuropathy: Secondary | ICD-10-CM | POA: Diagnosis not present

## 2015-03-06 DIAGNOSIS — M1009 Idiopathic gout, multiple sites: Secondary | ICD-10-CM | POA: Diagnosis not present

## 2015-03-06 DIAGNOSIS — L89523 Pressure ulcer of left ankle, stage 3: Secondary | ICD-10-CM | POA: Diagnosis not present

## 2015-03-22 ENCOUNTER — Other Ambulatory Visit: Payer: Self-pay | Admitting: Internal Medicine

## 2015-04-22 ENCOUNTER — Other Ambulatory Visit: Payer: Self-pay | Admitting: Internal Medicine

## 2015-05-18 ENCOUNTER — Other Ambulatory Visit: Payer: Self-pay | Admitting: Internal Medicine

## 2015-05-22 ENCOUNTER — Other Ambulatory Visit: Payer: Self-pay | Admitting: Internal Medicine

## 2015-05-22 ENCOUNTER — Ambulatory Visit (INDEPENDENT_AMBULATORY_CARE_PROVIDER_SITE_OTHER): Payer: BLUE CROSS/BLUE SHIELD | Admitting: Ophthalmology

## 2015-05-29 ENCOUNTER — Ambulatory Visit (INDEPENDENT_AMBULATORY_CARE_PROVIDER_SITE_OTHER): Payer: BLUE CROSS/BLUE SHIELD | Admitting: Ophthalmology

## 2015-05-31 ENCOUNTER — Telehealth: Payer: Self-pay | Admitting: Internal Medicine

## 2015-05-31 NOTE — Telephone Encounter (Signed)
Pt c/o Shortness Of Breath: STAT if SOB developed within the last 24 hours or pt is noticeably SOB on the phone  1. Are you currently SOB (can you hear that pt is SOB on the phone)? No  2. How long have you been experiencing SOB? Few weeks or longer  3. Are you SOB when sitting or when up moving around? Moving around  4. Are you currently experiencing any other symptoms? No

## 2015-05-31 NOTE — Telephone Encounter (Signed)
Pt. Call to make an appointment with Dr.Taylor. Pt said that he has been having SOB with mild activity for the last few weeks, and he thinks he is having some fluid buildup in his body.  Pt would like to see Dr. Lovena Le only. Pt wants to wait for Dr. Tanna Furry for appointment. Pt is aware that I will send this message to Lorenda Hatchet MD's scheduler.

## 2015-06-04 ENCOUNTER — Encounter: Payer: Self-pay | Admitting: Nurse Practitioner

## 2015-06-04 ENCOUNTER — Ambulatory Visit (INDEPENDENT_AMBULATORY_CARE_PROVIDER_SITE_OTHER): Payer: Medicare Other | Admitting: Nurse Practitioner

## 2015-06-04 VITALS — BP 116/60 | HR 75 | Ht 69.0 in | Wt 296.0 lb

## 2015-06-04 DIAGNOSIS — I5032 Chronic diastolic (congestive) heart failure: Secondary | ICD-10-CM

## 2015-06-04 DIAGNOSIS — I10 Essential (primary) hypertension: Secondary | ICD-10-CM | POA: Diagnosis not present

## 2015-06-04 DIAGNOSIS — R0602 Shortness of breath: Secondary | ICD-10-CM | POA: Diagnosis not present

## 2015-06-04 LAB — CBC
HEMATOCRIT: 29.3 % — AB (ref 39.0–52.0)
Hemoglobin: 9 g/dL — ABNORMAL LOW (ref 13.0–17.0)
MCH: 22.2 pg — ABNORMAL LOW (ref 26.0–34.0)
MCHC: 30.7 g/dL (ref 30.0–36.0)
MCV: 72.3 fL — ABNORMAL LOW (ref 78.0–100.0)
MPV: 8.8 fL (ref 8.6–12.4)
PLATELETS: 221 10*3/uL (ref 150–400)
RBC: 4.05 MIL/uL — ABNORMAL LOW (ref 4.22–5.81)
RDW: 17.6 % — AB (ref 11.5–15.5)
WBC: 7.4 10*3/uL (ref 4.0–10.5)

## 2015-06-04 LAB — TSH: TSH: 3.093 u[IU]/mL (ref 0.350–4.500)

## 2015-06-04 LAB — BASIC METABOLIC PANEL
BUN: 33 mg/dL — ABNORMAL HIGH (ref 7–25)
CHLORIDE: 102 mmol/L (ref 98–110)
CO2: 28 mmol/L (ref 20–31)
Calcium: 9.3 mg/dL (ref 8.6–10.3)
Creat: 1.57 mg/dL — ABNORMAL HIGH (ref 0.70–1.18)
Glucose, Bld: 152 mg/dL — ABNORMAL HIGH (ref 65–99)
POTASSIUM: 4.3 mmol/L (ref 3.5–5.3)
Sodium: 141 mmol/L (ref 135–146)

## 2015-06-04 LAB — BRAIN NATRIURETIC PEPTIDE: Brain Natriuretic Peptide: 113.8 pg/mL — ABNORMAL HIGH (ref 0.0–100.0)

## 2015-06-04 NOTE — Patient Instructions (Signed)
Medication Instructions: Your physician recommends that you continue on your current medications as directed. Please refer to the Current Medication list given to you today.  Labwork: CBCB BMET TSH BNP    Testing/Procedures: Your physician has requested that you have an echocardiogram. Echocardiography is a painless test that uses sound waves to create images of your heart. It provides your doctor with information about the size and shape of your heart and how well your heart's chambers and valves are working. This procedure takes approximately one hour. There are no restrictions for this procedure.     Follow-Up: WITH DR Lovena Le 10:15 AM SLOT ON 06/18/15 PER AMBER SEILER      Any Other Special Instructions Will Be Listed Below (If Applicable).

## 2015-06-04 NOTE — Progress Notes (Signed)
Electrophysiology Office Note Date: 06/04/2015  ID:  Oscar [name], DOB 08/22/1945, MRN [id number]  PCP: Henrine Screws, MD Electrophysiologist: Lovena Le  CC: increased shortness of breath  Oscar [name] is a 69 y.o. male seen today for Dr Lovena Le. He presents today after a 10-14 day history of increased shortness of breath.  He is particularly short of breath with exertion even on flat ground. He is not having chest pain or orthopnea. He doubled his Lasix for the last few days which has helped with shortness of breath and LE edema but he is still not at baseline. He does not weigh himself daily.   He denies chest pain, palpitations, PND, orthopnea, nausea, vomiting, dizziness, syncope, edema.  He has been compliant with low salt diet but admits to eating more than normal.   Last echo 2014 demonstrated EF 55-60%, mild MR, biatrial enlargement.   Past Medical History  Diagnosis Date  . Diabetes mellitus without complication ([place])     improved after diet/exercise  . Hypertension     improved after diet/exercise  . Kidney stones   . GERD (gastroesophageal reflux disease)   . H/O hiatal hernia   . Arthritis     hips  . OSA (obstructive sleep apnea)     wears cpap  . Anal fissure     h/o - no recent complications  . Atrial fibrillation ([place])   . Gout   . Stroke [facility]) 08/2013    short term memory loss (slight)  . Hx of typhoid fever     as a child  . Enlarged prostate   . PONV (postoperative nausea and vomiting)     also difficulty waking up   Past Surgical History  Procedure Laterality Date  . Cholecystectomy    . Knee arthroscopy Bilateral   . Carpel tunnel Bilateral   . Ligament repair    . Kidney stone surgery    . Tonsillectomy    . Eye surgery      laser surgery   . Colonoscopy    . Vasectomy    . I&d extremity Left 09/15/2014    Procedure: Excision Necrotic Left Achilles, Skin Graft, apply Wound VAC;  Surgeon: Newt Minion, MD;  Location: [facility];  Service:  Orthopedics;  Laterality: Left;    Current Outpatient Prescriptions  Medication Sig Dispense Refill  . acetaminophen (TYLENOL) 500 MG tablet Take 1,500 mg by mouth every 6 (six) hours as needed for moderate pain.    Marland Kitchen allopurinol (ZYLOPRIM) 300 MG tablet Take 300 mg by mouth 3 (three) times daily as needed. (GOUT)  12  . blood glucose meter kit and supplies KIT Dispense based on patient and insurance preference. Use up to four times daily as directed. (FOR ICD-9 250.00, 250.01). 1 each 0  . diltiazem (CARDIZEM CD) 360 MG 24 hr capsule Take 1 capsule (360 mg total) by mouth daily. 90 capsule 0  . ELIQUIS 5 MG TABS tablet TAKE 1 TABLET(5 MG) BY MOUTH TWICE DAILY 180 tablet 1  . escitalopram (LEXAPRO) 10 MG tablet Take 10 mg by mouth at bedtime.     . Febuxostat (ULORIC) 80 MG TABS Take 80 mg by mouth daily.    . furosemide (LASIX) 40 MG tablet TAKE 1 TABLET BY MOUTH ONCE DAILY 90 tablet 2  . glyBURIDE (DIABETA) 5 MG tablet Take 5 mg by mouth 2 (two) times daily with a meal.    . indomethacin (INDOCIN) 50 MG capsule Take 50 mg  by mouth 3 (three) times daily as needed (for gout flare ups).     . insulin aspart (NOVOLOG) 100 UNIT/ML injection Inject 0-9 Units into the skin 3 (three) times daily as needed for high blood sugar.    Marland Kitchen LEVEMIR 100 UNIT/ML injection Inject 20 Units into the skin at bedtime.  5  . metFORMIN (GLUCOPHAGE) 1000 MG tablet Take 1,000 mg by mouth 2 (two) times daily with a meal.    . OVER THE COUNTER MEDICATION Take 25 mg by mouth daily as needed (allergies). Med Name: DIPHENHYDRAMINE    . pantoprazole (PROTONIX) 40 MG tablet Take 1 tablet (40 mg total) by mouth daily. 30 tablet 1  . potassium chloride SA (K-DUR,KLOR-CON) 20 MEQ tablet TAKE 1 TABLET BY MOUTH ONCE DAILY 90 tablet 3  . simvastatin (ZOCOR) 20 MG tablet Take one (1) tablet (20 mg total) by mouth on Mondays, Wednesdays, and Fridays.    . tamsulosin (FLOMAX) 0.4 MG CAPS capsule Take 1 capsule (0.4 mg total) by mouth  daily after breakfast. 30 capsule 0   No current facility-administered medications for this visit.    Allergies:   Iohexol and Ivp dye   Social History: Social History   Social History  . Marital Status: Married    Spouse Name: N/A  . Number of Children: Y  . Years of Education: N/A   Occupational History  . retired    Social History Main Topics  . Smoking status: Never Smoker   . Smokeless tobacco: Never Used  . Alcohol Use: 0.0 oz/week    0 Standard drinks or equivalent per week     Comment: rare - 1-2x /yr  . Drug Use: No  . Sexual Activity: Not on file   Other Topics Concern  . Not on file   Social History Narrative    Family History: Family History  Problem Relation Age of Onset  . COPD Father   . Heart attack Sister     died age 70 of MI    Review of Systems: All other systems reviewed and are otherwise negative except as noted above.   Physical Exam: VS:  BP 116/60 mmHg  Pulse 75  Ht 5' 9" (1.753 m)  Wt 296 lb (134.265 kg)  BMI 43.69 kg/m2  SpO2 95% , BMI Body mass index is 43.69 kg/(m^2). Wt Readings from Last 3 Encounters:  06/04/15 296 lb (134.265 kg)  12/18/14 290 lb 6.4 oz (131.725 kg)  10/24/14 279 lb 9.6 oz (126.826 kg)    GEN- The patient is elderly and obese appearing, alert and oriented x 3 today.   HEENT: normocephalic, atraumatic; sclera clear, conjunctiva pink; hearing intact; oropharynx clear; neck supple  Lungs- Clear to ausculation bilaterally, normal work of breathing.  No wheezes, rales, rhonchi Heart- Irregular rate and rhythm, distant heart sounds GI- obese, non-tender, non-distended, bowel sounds present  Extremities- no clubbing, cyanosis, 1+ BLE edema (L>R); DP/PT/radial pulses 1+ bilaterally MS- no significant deformity or atrophy Skin- warm and dry, no rash or lesion  Psych- euthymic mood, full affect Neuro- strength and sensation are intact   EKG:  EKG is ordered today. The ekg ordered today shows atrial  fibrillation, rate 75, poor R wave progression  Recent Labs: 08/28/2014: TSH 2.570 09/15/2014: ALT 14; BUN 24*; Creatinine, Ser 1.34; Potassium 4.1; Sodium 142 09/18/2014: Hemoglobin 9.5*; Platelets 235    Other studies Reviewed: Additional studies/ records that were reviewed today include: Dr Tanna Furry office notes, last echo  Assessment and Plan:  1.  Shortness of breath/chronic diastolic heart failure Likely multifactoral and related to obesity as well as diastolic dysfunction CBC, BMET, TSH, BNP today Will determine diuretic dosing after labs back Update echo  2.  Permanent atrial fibrillation V rates controlled Continue Diltiazem Continue Eliquis for CHADS2VASC of at least 5  3.  HTN Stable No change required today  4.  Morbid obesity Body mass index is 43.69 kg/(m^2). Weight loss encouraged today Activity has been limited 2/2 gangrene removed on left foot  Current medicines are reviewed at length with the patient today.   The patient does not have concerns regarding his medicines.  The following changes were made today:  none  Labs/ tests ordered today include:  Orders Placed This Encounter  Procedures  . CBC  . Basic Metabolic Panel (BMET)  . B Nat Peptide  . TSH  . EKG 12-Lead  . Echocardiogram     Disposition:   Follow up with Dr Lovena Le or myself in 2 weeks    Signed, Chanetta Marshall, NP 06/04/2015 10:57 AM   [address] [place] [address] [phone] (office) [phone] (fax)

## 2015-06-05 ENCOUNTER — Telehealth: Payer: Self-pay | Admitting: Internal Medicine

## 2015-06-05 ENCOUNTER — Other Ambulatory Visit: Payer: Self-pay | Admitting: Internal Medicine

## 2015-06-05 ENCOUNTER — Telehealth: Payer: Self-pay | Admitting: *Deleted

## 2015-06-05 NOTE — Telephone Encounter (Signed)
ashley @ [facility] states that they did not have the refill on file, i verbalized 2 refills for the pt, last OV 05/2015. will call and inform pt.

## 2015-06-05 NOTE — Telephone Encounter (Signed)
spoke to Oscar [name], he verbalized understanding and thanks for calling in his refills.

## 2015-06-05 NOTE — Telephone Encounter (Signed)
°  STAT if patient is at the pharmacy , call can be transferred to refill team.   1. Which medications need to be refilled? Eliquis 5 mg  2. Which pharmacy/location is medication to be sent to? [facility]  3. Do they need a 30 day or 90 day supply? 37 Day   Office stating that pt called [name] and it was suppose to be sent to pharmacy, pharmacy states they never received refill. Please call back and advise.

## 2015-06-06 ENCOUNTER — Telehealth: Payer: Self-pay | Admitting: Internal Medicine

## 2015-06-06 NOTE — Telephone Encounter (Signed)
New message ° ° ° ° ° ° °Calling to get lab results °

## 2015-06-07 DIAGNOSIS — L89523 Pressure ulcer of left ankle, stage 3: Secondary | ICD-10-CM | POA: Diagnosis not present

## 2015-06-07 DIAGNOSIS — E1142 Type 2 diabetes mellitus with diabetic polyneuropathy: Secondary | ICD-10-CM | POA: Diagnosis not present

## 2015-06-07 DIAGNOSIS — M1009 Idiopathic gout, multiple sites: Secondary | ICD-10-CM | POA: Diagnosis not present

## 2015-06-08 ENCOUNTER — Telehealth: Payer: Self-pay | Admitting: *Deleted

## 2015-06-11 ENCOUNTER — Ambulatory Visit: Payer: Medicare Other | Admitting: Internal Medicine

## 2015-06-11 ENCOUNTER — Telehealth: Payer: Self-pay | Admitting: *Deleted

## 2015-06-12 ENCOUNTER — Other Ambulatory Visit (HOSPITAL_COMMUNITY): Payer: Medicare Other

## 2015-06-12 ENCOUNTER — Ambulatory Visit (HOSPITAL_COMMUNITY): Payer: Medicare Other | Attending: Cardiovascular Disease

## 2015-06-12 ENCOUNTER — Other Ambulatory Visit: Payer: Self-pay

## 2015-06-12 DIAGNOSIS — E119 Type 2 diabetes mellitus without complications: Secondary | ICD-10-CM | POA: Diagnosis not present

## 2015-06-12 DIAGNOSIS — I517 Cardiomegaly: Secondary | ICD-10-CM | POA: Diagnosis not present

## 2015-06-12 DIAGNOSIS — I351 Nonrheumatic aortic (valve) insufficiency: Secondary | ICD-10-CM | POA: Insufficient documentation

## 2015-06-12 DIAGNOSIS — R0602 Shortness of breath: Secondary | ICD-10-CM | POA: Diagnosis not present

## 2015-06-12 DIAGNOSIS — I10 Essential (primary) hypertension: Secondary | ICD-10-CM | POA: Diagnosis not present

## 2015-06-12 DIAGNOSIS — I340 Nonrheumatic mitral (valve) insufficiency: Secondary | ICD-10-CM | POA: Diagnosis not present

## 2015-06-14 ENCOUNTER — Ambulatory Visit (INDEPENDENT_AMBULATORY_CARE_PROVIDER_SITE_OTHER): Payer: Medicare Other | Admitting: Ophthalmology

## 2015-06-14 DIAGNOSIS — H35033 Hypertensive retinopathy, bilateral: Secondary | ICD-10-CM | POA: Diagnosis not present

## 2015-06-14 DIAGNOSIS — I10 Essential (primary) hypertension: Secondary | ICD-10-CM

## 2015-06-14 DIAGNOSIS — E11319 Type 2 diabetes mellitus with unspecified diabetic retinopathy without macular edema: Secondary | ICD-10-CM | POA: Diagnosis not present

## 2015-06-14 DIAGNOSIS — E113313 Type 2 diabetes mellitus with moderate nonproliferative diabetic retinopathy with macular edema, bilateral: Secondary | ICD-10-CM | POA: Diagnosis not present

## 2015-06-14 DIAGNOSIS — H43813 Vitreous degeneration, bilateral: Secondary | ICD-10-CM | POA: Diagnosis not present

## 2015-06-18 ENCOUNTER — Telehealth: Payer: Self-pay | Admitting: *Deleted

## 2015-06-18 ENCOUNTER — Encounter: Payer: Self-pay | Admitting: Internal Medicine

## 2015-06-18 ENCOUNTER — Ambulatory Visit (INDEPENDENT_AMBULATORY_CARE_PROVIDER_SITE_OTHER): Payer: Medicare Other | Admitting: Internal Medicine

## 2015-06-18 VITALS — BP 132/70 | HR 100 | Ht 69.0 in | Wt 294.0 lb

## 2015-06-18 DIAGNOSIS — N182 Chronic kidney disease, stage 2 (mild): Secondary | ICD-10-CM | POA: Diagnosis not present

## 2015-06-18 MED ORDER — FUROSEMIDE 40 MG PO TABS: ORAL_TABLET | ORAL | Status: DC

## 2015-06-18 NOTE — Assessment & Plan Note (Signed)
His symptoms are class 2. I have asked the patient to make a concerted effort to lose weight. He is strongly encouraged to reduce his sodium intake.

## 2015-06-18 NOTE — Assessment & Plan Note (Signed)
I asked the patient to reduce his dose of lasix, taking 80 mg daily 3 times a week and 40 mg daily the other days.

## 2015-06-18 NOTE — Patient Instructions (Signed)
Medication Instructions:  Your physician has recommended you make the following change in your medication: 1) CHANGE Lasix to -- take 2 tablets on Monday, Wednesday, and Fridays.  Take 1 tablet on Tuesday, Thursday, Saturday, & Sunday. You may take an extra tablet daily as needed.  Labwork: None ordered  Testing/Procedures: None  ordered  Follow-Up: Your physician wants you to follow-up in: 6 months with Dr. Lovena Le.  You will receive a reminder letter in the mail two months in advance. If you don't receive a letter, please call our office to schedule the follow-up appointment.   Any Other Special Instructions Will Be Listed Below (If Applicable).  If you need a refill on your cardiac medications before your next appointment, please call your pharmacy.  Thank you for choosing [facility]!!

## 2015-06-18 NOTE — Assessment & Plan Note (Signed)
His blood pressure is controlled. Less food and salt and continue his current meds.

## 2015-06-18 NOTE — Assessment & Plan Note (Signed)
He has been asymptomatic. His rates are reasonably controlled. No change in meds.

## 2015-06-18 NOTE — Progress Notes (Signed)
HPI Mr. Ta returns today for followup. He is a very pleasant 69 year old man with morbid obesity and hypertension and diabetes.  He has had problems with volume overload.  He denies chest pain or syncope. No palpitations. He has mild dyspnea with exertion. Overall he feels better. His wife quit doing the cooking and he has gained 20 lbs in the past 6 months. He admits to dietary indiscretion.  Allergies  Allergen Reactions  . Iohexol      Desc: HIVES S/P 13HR.PREMEDS, ?LOW DOSAGE PREMEDS   . Ivp Dye [Iodinated Diagnostic Agents]     welps      Current Outpatient Prescriptions  Medication Sig Dispense Refill  . acetaminophen (TYLENOL) 500 MG tablet Take 1,500 mg by mouth every 6 (six) hours as needed for moderate pain.    Marland Kitchen allopurinol (ZYLOPRIM) 300 MG tablet Take 300 mg by mouth 3 (three) times daily as needed. (GOUT)  12  . blood glucose meter kit and supplies KIT Dispense based on patient and insurance preference. Use up to four times daily as directed. (FOR ICD-9 250.00, 250.01). 1 each 0  . diltiazem (CARDIZEM CD) 360 MG 24 hr capsule Take 1 capsule (360 mg total) by mouth daily. 90 capsule 0  . ELIQUIS 5 MG TABS tablet TAKE 1 TABLET(5 MG) BY MOUTH TWICE DAILY 180 tablet 1  . escitalopram (LEXAPRO) 10 MG tablet Take 10 mg by mouth at bedtime.     . Febuxostat (ULORIC) 80 MG TABS Take 80 mg by mouth daily.    . furosemide (LASIX) 40 MG tablet TAKE 1 TABLET BY MOUTH ONCE DAILY 90 tablet 2  . glyBURIDE (DIABETA) 5 MG tablet Take 5 mg by mouth 2 (two) times daily with a meal.    . indomethacin (INDOCIN) 50 MG capsule Take 50 mg by mouth 3 (three) times daily as needed (for gout flare ups).     . insulin aspart (NOVOLOG) 100 UNIT/ML injection Inject 0-9 Units into the skin 3 (three) times daily as needed for high blood sugar.    Marland Kitchen LEVEMIR 100 UNIT/ML injection Inject 20 Units into the skin at bedtime.  5  . metFORMIN (GLUCOPHAGE) 1000 MG tablet Take 1,000 mg by mouth 2 (two) times  daily with a meal.    . nitroGLYCERIN (NITRODUR - DOSED IN MG/24 HR) 0.2 mg/hr patch Place 0.2 mg onto the skin as needed (chest pain).   3  . OVER THE COUNTER MEDICATION Take 25 mg by mouth daily as needed (allergies). Med Name: DIPHENHYDRAMINE    . pantoprazole (PROTONIX) 40 MG tablet Take 1 tablet (40 mg total) by mouth daily. 30 tablet 1  . potassium chloride SA (K-DUR,KLOR-CON) 20 MEQ tablet TAKE 1 TABLET BY MOUTH ONCE DAILY 90 tablet 3  . simvastatin (ZOCOR) 20 MG tablet Take one (1) tablet (20 mg total) by mouth on Mondays, Wednesdays, and Fridays.    . tamsulosin (FLOMAX) 0.4 MG CAPS capsule Take 1 capsule (0.4 mg total) by mouth daily after breakfast. 30 capsule 0   No current facility-administered medications for this visit.     Past Medical History  Diagnosis Date  . Diabetes mellitus without complication ([place])     improved after diet/exercise  . Hypertension     improved after diet/exercise  . Kidney stones   . GERD (gastroesophageal reflux disease)   . H/O hiatal hernia   . Arthritis     hips  . OSA (obstructive sleep apnea)     wears cpap  .  Anal fissure     h/o - no recent complications  . Atrial fibrillation ([place])   . Gout   . Stroke [facility]) 08/2013    short term memory loss (slight)  . Hx of typhoid fever     as a child  . Enlarged prostate   . PONV (postoperative nausea and vomiting)     also difficulty waking up    ROS:   All systems reviewed and negative except as noted in the HPI.   Past Surgical History  Procedure Laterality Date  . Cholecystectomy    . Knee arthroscopy Bilateral   . Carpel tunnel Bilateral   . Ligament repair    . Kidney stone surgery    . Tonsillectomy    . Eye surgery      laser surgery   . Colonoscopy    . Vasectomy    . I&d extremity Left 09/15/2014    Procedure: Excision Necrotic Left Achilles, Skin Graft, apply Wound VAC;  Surgeon: Newt Minion, MD;  Location: [facility];  Service: Orthopedics;  Laterality: Left;      Family History  Problem Relation Age of Onset  . COPD Father   . Heart attack Sister     died age 12 of MI     Social History   Social History  . Marital Status: Married    Spouse Name: N/A  . Number of Children: Y  . Years of Education: N/A   Occupational History  . retired    Social History Main Topics  . Smoking status: Never Smoker   . Smokeless tobacco: Never Used  . Alcohol Use: 0.0 oz/week    0 Standard drinks or equivalent per week     Comment: rare - 1-2x /yr  . Drug Use: No  . Sexual Activity: Not on file   Other Topics Concern  . Not on file   Social History Narrative     BP 132/70 mmHg  Pulse 100  Ht _0  (1.753 m)  Wt 294 lb (133.358 kg)  BMI 43.40 kg/m2  SpO2 92%  Physical Exam:  Morbidly obese appearing 70 year old man, NAD HEENT: Unremarkable Neck:  7 cm JVD, no thyromegally Back:  No CVA tenderness Lungs:  Clear with no wheezes, rales, or rhonchi. HEART:  IRegular rate rhythm, no murmurs, no rubs, no clicks Abd:  soft, obese, positive bowel sounds, no organomegally, no rebound, no guarding Ext:  2 plus pulses,  Trace peripheral edema, no cyanosis, no clubbing Skin:  No rashes no nodules Neuro:  CN II through XII intact, motor grossly intact    Assess/Plan:

## 2015-07-03 DIAGNOSIS — E782 Mixed hyperlipidemia: Secondary | ICD-10-CM | POA: Diagnosis not present

## 2015-07-03 DIAGNOSIS — Z125 Encounter for screening for malignant neoplasm of prostate: Secondary | ICD-10-CM | POA: Diagnosis not present

## 2015-07-03 DIAGNOSIS — I509 Heart failure, unspecified: Secondary | ICD-10-CM | POA: Diagnosis not present

## 2015-07-03 DIAGNOSIS — E0840 Diabetes mellitus due to underlying condition with diabetic neuropathy, unspecified: Secondary | ICD-10-CM | POA: Diagnosis not present

## 2015-07-03 DIAGNOSIS — Z7984 Long term (current) use of oral hypoglycemic drugs: Secondary | ICD-10-CM | POA: Diagnosis not present

## 2015-07-03 DIAGNOSIS — Z79899 Other long term (current) drug therapy: Secondary | ICD-10-CM | POA: Diagnosis not present

## 2015-07-03 DIAGNOSIS — M109 Gout, unspecified: Secondary | ICD-10-CM | POA: Diagnosis not present

## 2015-07-03 DIAGNOSIS — E08621 Diabetes mellitus due to underlying condition with foot ulcer: Secondary | ICD-10-CM | POA: Diagnosis not present

## 2015-07-03 DIAGNOSIS — Z0001 Encounter for general adult medical examination with abnormal findings: Secondary | ICD-10-CM | POA: Diagnosis not present

## 2015-07-03 DIAGNOSIS — Z6841 Body Mass Index (BMI) 40.0 and over, adult: Secondary | ICD-10-CM | POA: Diagnosis not present

## 2015-07-03 DIAGNOSIS — E559 Vitamin D deficiency, unspecified: Secondary | ICD-10-CM | POA: Diagnosis not present

## 2015-07-12 DIAGNOSIS — E1142 Type 2 diabetes mellitus with diabetic polyneuropathy: Secondary | ICD-10-CM | POA: Diagnosis not present

## 2015-07-12 DIAGNOSIS — M1009 Idiopathic gout, multiple sites: Secondary | ICD-10-CM | POA: Diagnosis not present

## 2015-07-12 DIAGNOSIS — L89523 Pressure ulcer of left ankle, stage 3: Secondary | ICD-10-CM | POA: Diagnosis not present

## 2015-07-18 DIAGNOSIS — D649 Anemia, unspecified: Secondary | ICD-10-CM | POA: Diagnosis not present

## 2015-08-15 NOTE — Telephone Encounter (Signed)
LMOVM TO CONTACT OFFICE BACK FOR RESULTS on home phone also called on cell but mailbox filled couldnt leave message

## 2015-08-15 NOTE — Telephone Encounter (Signed)
lmovm again to call back for lab results and reccomendations

## 2015-08-15 NOTE — Telephone Encounter (Signed)
LMOVM TO CALL BACK OFFICE FOR RESULTS HOME AND CELL # VM BOX FILLED

## 2015-08-16 DIAGNOSIS — L89523 Pressure ulcer of left ankle, stage 3: Secondary | ICD-10-CM | POA: Diagnosis not present

## 2015-08-16 DIAGNOSIS — M1009 Idiopathic gout, multiple sites: Secondary | ICD-10-CM | POA: Diagnosis not present

## 2015-08-16 DIAGNOSIS — E1142 Type 2 diabetes mellitus with diabetic polyneuropathy: Secondary | ICD-10-CM | POA: Diagnosis not present

## 2015-09-04 DIAGNOSIS — G4733 Obstructive sleep apnea (adult) (pediatric): Secondary | ICD-10-CM | POA: Diagnosis not present

## 2015-09-05 ENCOUNTER — Ambulatory Visit (INDEPENDENT_AMBULATORY_CARE_PROVIDER_SITE_OTHER): Payer: Medicare Other | Admitting: Internal Medicine

## 2015-09-05 ENCOUNTER — Encounter: Payer: Self-pay | Admitting: Internal Medicine

## 2015-09-05 VITALS — BP 140/78 | HR 93 | Ht 69.0 in | Wt 294.8 lb

## 2015-09-05 DIAGNOSIS — I10 Essential (primary) hypertension: Secondary | ICD-10-CM | POA: Diagnosis not present

## 2015-09-05 DIAGNOSIS — I5032 Chronic diastolic (congestive) heart failure: Secondary | ICD-10-CM

## 2015-09-05 DIAGNOSIS — I480 Paroxysmal atrial fibrillation: Secondary | ICD-10-CM

## 2015-09-05 NOTE — Patient Instructions (Signed)
Medication Instructions:  Your physician recommends that you continue on your current medications as directed. Please refer to the Current Medication list given to you today.  Labwork: None ordered  Testing/Procedures: Your physician has recommended that you have a cardiopulmonary stress test (CPX). CPX testing is a non-invasive measurement of heart and lung function. It replaces a traditional treadmill stress test. This type of test provides a tremendous amount of information that relates not only to your present condition but also for future outcomes. This test combines measurements of you ventilation, respiratory gas exchange in the lungs, electrocardiogram (EKG), blood pressure and physical response before, during, and following an exercise protocol.  Follow-Up: Your physician recommends that you schedule a follow-up appointment after CPX testing with Dr. Haroldine Laws.  If you need a refill on your cardiac medications before your next appointment, please call your pharmacy.  Thank you for choosing [facility]!!

## 2015-09-05 NOTE — Assessment & Plan Note (Signed)
His symptoms appear to be worse. I have asked him to undergo cardiopulmonary stress testing and I will refer him to the CHF clinic. He will likely need repeat left and right heart cath but will first review the results of his stress test and CHF clinic evaluation.

## 2015-09-05 NOTE — Assessment & Plan Note (Signed)
His blood pressure is controlled. Will follow. He is encouraged to lose weight.

## 2015-09-05 NOTE — Assessment & Plan Note (Signed)
We discussed the importance of weight loss. Will follow.

## 2015-09-05 NOTE — Progress Notes (Signed)
HPI [name] returns today for followup. He is a very pleasant 70 year old man with morbid obesity and hypertension and diabetes.  He has had problems with volume overload.  He denies chest pain or syncope. No palpitations. He has mild dyspnea with exertion.  He admits to dietary indiscretion.  He thinks his dyspnea is worse. He denies excess sodium intake. Allergies  Allergen Reactions  . Iohexol      Desc: HIVES S/P 13HR.PREMEDS, ?LOW DOSAGE PREMEDS   . Ivp Dye [Iodinated Diagnostic Agents]     welps      Current Outpatient Prescriptions  Medication Sig Dispense Refill  . acetaminophen (TYLENOL) 500 MG tablet Take 1,500 mg by mouth every 6 (six) hours as needed for moderate pain.    Marland Kitchen allopurinol (ZYLOPRIM) 300 MG tablet Take 300 mg by mouth 3 (three) times daily as needed. (GOUT)  12  . blood glucose meter kit and supplies KIT Dispense based on patient and insurance preference. Use up to four times daily as directed. (FOR ICD-9 250.00, 250.01). 1 each 0  . diltiazem (CARDIZEM CD) 360 MG 24 hr capsule Take 1 capsule (360 mg total) by mouth daily. 90 capsule 0  . ELIQUIS 5 MG TABS tablet TAKE 1 TABLET(5 MG) BY MOUTH TWICE DAILY 180 tablet 1  . escitalopram (LEXAPRO) 10 MG tablet Take 10 mg by mouth at bedtime.     . Febuxostat (ULORIC) 80 MG TABS Take 80 mg by mouth daily.    . furosemide (LASIX) 40 MG tablet Take 2 tablets (80 mg total) by mouth Monday, Wednesday, & Friday.  Take 1 tablet (40 mg total) by mouth on the other days. 120 tablet 2  . glyBURIDE (DIABETA) 5 MG tablet Take 5 mg by mouth 2 (two) times daily with a meal.    . indomethacin (INDOCIN) 50 MG capsule Take 50 mg by mouth 3 (three) times daily as needed (for gout flare ups).     . insulin aspart (NOVOLOG) 100 UNIT/ML injection Inject 0-9 Units into the skin 3 (three) times daily as needed for high blood sugar.    Marland Kitchen LEVEMIR 100 UNIT/ML injection Inject 20 Units into the skin at bedtime.  5  . metFORMIN (GLUCOPHAGE)  1000 MG tablet Take 1,000 mg by mouth 2 (two) times daily with a meal.    . OVER THE COUNTER MEDICATION Take 25 mg by mouth daily as needed (allergies). Med Name: DIPHENHYDRAMINE    . pantoprazole (PROTONIX) 40 MG tablet Take 1 tablet (40 mg total) by mouth daily. 30 tablet 1  . potassium chloride SA (K-DUR,KLOR-CON) 20 MEQ tablet TAKE 1 TABLET BY MOUTH ONCE DAILY 90 tablet 3  . simvastatin (ZOCOR) 20 MG tablet Take one (1) tablet (20 mg total) by mouth on Mondays, Wednesdays, and Fridays.    . tamsulosin (FLOMAX) 0.4 MG CAPS capsule Take 1 capsule (0.4 mg total) by mouth daily after breakfast. 30 capsule 0   No current facility-administered medications for this visit.     Past Medical History  Diagnosis Date  . Diabetes mellitus without complication ([place])     improved after diet/exercise  . Hypertension     improved after diet/exercise  . Kidney stones   . GERD (gastroesophageal reflux disease)   . H/O hiatal hernia   . Arthritis     hips  . OSA (obstructive sleep apnea)     wears cpap  . Anal fissure     h/o - no recent complications  . Atrial fibrillation (  HCC)   . Gout   . Stroke [facility]) 08/2013    short term memory loss (slight)  . Hx of typhoid fever     as a child  . Enlarged prostate   . PONV (postoperative nausea and vomiting)     also difficulty waking up    ROS:   All systems reviewed and negative except as noted in the HPI.   Past Surgical History  Procedure Laterality Date  . Cholecystectomy    . Knee arthroscopy Bilateral   . Carpel tunnel Bilateral   . Ligament repair    . Kidney stone surgery    . Tonsillectomy    . Eye surgery      laser surgery   . Colonoscopy    . Vasectomy    . I&d extremity Left 09/15/2014    Procedure: Excision Necrotic Left Achilles, Skin Graft, apply Wound VAC;  Surgeon: Newt Minion, MD;  Location: [place];  Service: Orthopedics;  Laterality: Left;     Family History  Problem Relation Age of Onset  . COPD Father   .  Heart attack Sister     died age 32 of MI     Social History   Social History  . Marital Status: Married    Spouse Name: N/A  . Number of Children: Y  . Years of Education: N/A   Occupational History  . retired    Social History Main Topics  . Smoking status: Never Smoker   . Smokeless tobacco: Never Used  . Alcohol Use: 0.0 oz/week    0 Standard drinks or equivalent per week     Comment: rare - 1-2x /yr  . Drug Use: No  . Sexual Activity: Not on file   Other Topics Concern  . Not on file   Social History Narrative     BP 140/78 mmHg  Pulse 93  Ht '5\' 9"'$  (1.753 m)  Wt 294 lb 12.8 oz (133.72 kg)  BMI 43.51 kg/m2  Physical Exam:  Morbidly obese appearing 70 year old man, NAD HEENT: Unremarkable Neck:  7 cm JVD, no thyromegally Back:  No CVA tenderness Lungs:  Clear with no wheezes, rales, or rhonchi. HEART:  IRegular rate rhythm, no murmurs, no rubs, no clicks Abd:  soft, obese, positive bowel sounds, no organomegally, no rebound, no guarding Ext:  2 plus pulses,  Trace peripheral edema, no cyanosis, no clubbing Skin:  No rashes no nodules Neuro:  CN II through XII intact, motor grossly intact  ECG - atrial fib with a controlled VR  Assess/Plan:

## 2015-09-05 NOTE — Assessment & Plan Note (Signed)
hIs resting ventricular rate appears controlled. Will follow.

## 2015-09-14 DIAGNOSIS — Z85828 Personal history of other malignant neoplasm of skin: Secondary | ICD-10-CM | POA: Diagnosis not present

## 2015-09-14 DIAGNOSIS — L111 Transient acantholytic dermatosis [Grover]: Secondary | ICD-10-CM | POA: Diagnosis not present

## 2015-09-14 DIAGNOSIS — L821 Other seborrheic keratosis: Secondary | ICD-10-CM | POA: Diagnosis not present

## 2015-09-19 ENCOUNTER — Ambulatory Visit (HOSPITAL_COMMUNITY): Payer: Medicare Other | Attending: Internal Medicine

## 2015-09-19 DIAGNOSIS — I5032 Chronic diastolic (congestive) heart failure: Secondary | ICD-10-CM | POA: Diagnosis not present

## 2015-09-20 ENCOUNTER — Other Ambulatory Visit (HOSPITAL_COMMUNITY): Payer: Self-pay | Admitting: *Deleted

## 2015-09-20 DIAGNOSIS — I5032 Chronic diastolic (congestive) heart failure: Secondary | ICD-10-CM | POA: Diagnosis not present

## 2015-09-20 DIAGNOSIS — I480 Paroxysmal atrial fibrillation: Secondary | ICD-10-CM

## 2015-09-20 DIAGNOSIS — D509 Iron deficiency anemia, unspecified: Secondary | ICD-10-CM | POA: Diagnosis not present

## 2015-09-25 ENCOUNTER — Ambulatory Visit (HOSPITAL_COMMUNITY)
Admission: RE | Admit: 2015-09-25 | Discharge: 2015-09-25 | Disposition: A | Discharge status: Home / Self Care | Payer: Medicare Other | Source: Ambulatory Visit | Attending: Internal Medicine | Admitting: Internal Medicine

## 2015-09-25 ENCOUNTER — Encounter (HOSPITAL_COMMUNITY): Payer: Self-pay | Admitting: Internal Medicine

## 2015-09-25 VITALS — BP 134/72 | HR 86 | Wt 296.5 lb

## 2015-09-25 DIAGNOSIS — Z79899 Other long term (current) drug therapy: Secondary | ICD-10-CM | POA: Diagnosis not present

## 2015-09-25 DIAGNOSIS — G4733 Obstructive sleep apnea (adult) (pediatric): Secondary | ICD-10-CM | POA: Insufficient documentation

## 2015-09-25 DIAGNOSIS — K219 Gastro-esophageal reflux disease without esophagitis: Secondary | ICD-10-CM | POA: Diagnosis not present

## 2015-09-25 DIAGNOSIS — Z825 Family history of asthma and other chronic lower respiratory diseases: Secondary | ICD-10-CM | POA: Diagnosis not present

## 2015-09-25 DIAGNOSIS — Z8673 Personal history of transient ischemic attack (TIA), and cerebral infarction without residual deficits: Secondary | ICD-10-CM | POA: Diagnosis not present

## 2015-09-25 DIAGNOSIS — Z8249 Family history of ischemic heart disease and other diseases of the circulatory system: Secondary | ICD-10-CM | POA: Diagnosis not present

## 2015-09-25 DIAGNOSIS — I110 Hypertensive heart disease with heart failure: Secondary | ICD-10-CM | POA: Diagnosis not present

## 2015-09-25 DIAGNOSIS — N182 Chronic kidney disease, stage 2 (mild): Secondary | ICD-10-CM | POA: Diagnosis not present

## 2015-09-25 DIAGNOSIS — Z7902 Long term (current) use of antithrombotics/antiplatelets: Secondary | ICD-10-CM | POA: Insufficient documentation

## 2015-09-25 DIAGNOSIS — Z91041 Radiographic dye allergy status: Secondary | ICD-10-CM | POA: Insufficient documentation

## 2015-09-25 DIAGNOSIS — I5033 Acute on chronic diastolic (congestive) heart failure: Secondary | ICD-10-CM | POA: Diagnosis not present

## 2015-09-25 DIAGNOSIS — Z8631 Personal history of diabetic foot ulcer: Secondary | ICD-10-CM | POA: Diagnosis not present

## 2015-09-25 DIAGNOSIS — I2510 Atherosclerotic heart disease of native coronary artery without angina pectoris: Secondary | ICD-10-CM | POA: Insufficient documentation

## 2015-09-25 DIAGNOSIS — R0609 Other forms of dyspnea: Secondary | ICD-10-CM | POA: Insufficient documentation

## 2015-09-25 DIAGNOSIS — I482 Chronic atrial fibrillation, unspecified: Secondary | ICD-10-CM

## 2015-09-25 DIAGNOSIS — E119 Type 2 diabetes mellitus without complications: Secondary | ICD-10-CM | POA: Insufficient documentation

## 2015-09-25 DIAGNOSIS — Z794 Long term (current) use of insulin: Secondary | ICD-10-CM | POA: Diagnosis not present

## 2015-09-25 MED ORDER — FUROSEMIDE 80 MG PO TABS: 80.0000 mg | ORAL_TABLET | Freq: Every day | ORAL | Status: DC

## 2015-09-25 MED ORDER — METOLAZONE 2.5 MG PO TABS: 2.5000 mg | ORAL_TABLET | Freq: Every day | ORAL | Status: DC

## 2015-09-25 NOTE — Progress Notes (Signed)
Patient ID: Oscar [name], male   DOB: 1946-01-03, 70 y.o.   MRN: [id number]    ADVANCED HF CLINIC CONSULT NOTE  Referring Physician: Lovena Le   HPI:  Oscar [name] is a 70 y.o. male with h/o morbid obesity, DM2, HTN, permanent AF, OSA on CPAP and diastolic HF. Referred by Dr. Lovena Le for further evaluation of worsening dyspnea.   Developed PAF about 2 years ago. He has been in AF since that time and has been relatively asymptomatic. Last February had a diabetic foot ulcer on the right heel with gangrene. Was operated on by Dr. Sharol Given and had limited mobility for over 8 months with only bed to chair activity. Over the past 4 months much more SOB. Very fatigued. Can still go to grocery store but gets SOB with any significant exertion. No CP. Has some mild ankle edema. + orthopnea over last week or so. Has gained 15-20 pounds since he had ankle surgery.  Takes lasix 80 mg M/W/F on 40 mg daily on other days. Increases his dose on occasion. Has never had a heart cath. CT chest 4/14 with diffuse coronary calcification (viewed personally)   Last echo 11/16 - Left ventricle: The cavity size was normal. Systolic function was normal. The estimated ejection fraction was in the range of 55% to 60%. Wall motion was normal; there were no regional wall motion abnormalities. - Aortic valve: There was trivial regurgitation. - Mitral valve: Calcified annulus. There was mild regurgitation. - Left atrium: The atrium was moderately dilated. - Right atrium: The atrium was mildly dilated. - Atrial septum: No defect or patent foramen ovale was identified. - Pulmonary arteries: PA peak pressure: 37 mm Hg (S).  CPX 2/17  FVC 2.11 (53%)    FEV1 1.55 (50%)     FEV1/FVC 74 (95%)     MVV 76 (61%)  Resting HR: 107 Peak HR: 136  (91% age predicted max HR) BP rest: 136/70 BP peak: 146/68 Peak VO2: 10.4 (56% predicted peak VO2) VE/VCO2 slope: 29.8 OUES: 1 Peak RER: 1.14 Ventilatory Threshold: 7.9  (42.5% predicted or measured peak VO2) Peak RR 24 Peak Ventilation: 45.9 VE/MVV: 60% PETCO2 at peak: 36 O2pulse: 10  (59% predicted O2pulse)  Conclusion: Exercise testing with gas exchange demonstrates a severe functional impairment when compared to matched sedentary norms. The limitation appears to be primarily due to the patients severe obesity and related restrictive lung physiology. However, the blunted BP response to exercise and low O2 pulse suggest a concomitant circulatory limitation which is likely related to his diastolic HF and atrial fibrillation.    Review of Systems: [y] = yes, _0  = no   General: Weight gain _1 ; Weight loss _2 ; Anorexia _3 ; Fatigue _4 ; Fever _5 ; Chills _6 ; Weakness _7   Cardiac: Chest pain/pressure _8 ; Resting SOB _9 ; Exertional SOB _10 ; Orthopnea _11 ; Pedal Edema _12 ; Palpitations _13 ; Syncope _14 ; Presyncope _15 ; Paroxysmal nocturnal dyspnea_16   Pulmonary: Cough _17 ; Wheezing_18 ; Hemoptysis_19 ; Sputum _20 ; Snoring _21   GI: Vomiting_22 ; Dysphagia_23 ; Melena_24 ; Hematochezia _25 ; Heartburn_26 ; Abdominal pain _27 ; Constipation _28 ; Diarrhea _29 ; BRBPR _30   GU: Hematuria_31 ; Dysuria _32 ; Nocturia_33   Vascular: Pain in legs with walking _34 ; Pain in feet with lying flat _35 ; Non-healing sores _36 ; Stroke _37 ; TIA _38 ; Slurred speech _39 ;  Neuro: Headaches_40 ; Vertigo_41 ;  Seizures_0 ; Paresthesias_1 ;Blurred vision _2 ; Diplopia _3 ; Vision changes _4   Ortho/Skin: Arthritis _5 ; Joint pain _6 ; Muscle pain _7 ; Joint swelling _8 ; Back Pain _9 ; Rash _10   Psych: Depression_11 ; Anxiety_12   Heme: Bleeding problems _13 ; Clotting disorders _14 ; Anemia _15   Endocrine: Diabetes _16 ; Thyroid dysfunction_17    Past Medical History  Diagnosis Date  . Diabetes mellitus without complication ([place])     improved after diet/exercise  . Hypertension     improved after diet/exercise  . Kidney stones   . GERD (gastroesophageal reflux disease)   . H/O  hiatal hernia   . Arthritis     hips  . OSA (obstructive sleep apnea)     wears cpap  . Anal fissure     h/o - no recent complications  . Atrial fibrillation ([place])   . Gout   . Stroke [facility]) 08/2013    short term memory loss (slight)  . Hx of typhoid fever     as a child  . Enlarged prostate   . PONV (postoperative nausea and vomiting)     also difficulty waking up    Current Outpatient Prescriptions  Medication Sig Dispense Refill  . acetaminophen (TYLENOL) 500 MG tablet Take 1,500 mg by mouth every 6 (six) hours as needed for moderate pain.    Marland Kitchen allopurinol (ZYLOPRIM) 300 MG tablet Take 300 mg by mouth 3 (three) times daily as needed. (GOUT)  12  . blood glucose meter kit and supplies KIT Dispense based on patient and insurance preference. Use up to four times daily as directed. (FOR ICD-9 250.00, 250.01). 1 each 0  . diltiazem (CARDIZEM CD) 360 MG 24 hr capsule Take 1 capsule (360 mg total) by mouth daily. 90 capsule 0  . ELIQUIS 5 MG TABS tablet TAKE 1 TABLET(5 MG) BY MOUTH TWICE DAILY 180 tablet 1  . escitalopram (LEXAPRO) 10 MG tablet Take 10 mg by mouth at bedtime.     . Febuxostat (ULORIC) 80 MG TABS Take 80 mg by mouth daily.    . furosemide (LASIX) 40 MG tablet Take 2 tablets (80 mg total) by mouth Monday, Wednesday, & Friday.  Take 1 tablet (40 mg total) by mouth on the other days. 120 tablet 2  . glyBURIDE (DIABETA) 5 MG tablet Take 5 mg by mouth 2 (two) times daily with a meal.    . indomethacin (INDOCIN) 50 MG capsule Take 50 mg by mouth 3 (three) times daily as needed (for gout flare ups).     . insulin aspart (NOVOLOG) 100 UNIT/ML injection Inject 0-9 Units into the skin 3 (three) times daily as needed for high blood sugar.    Marland Kitchen LEVEMIR 100 UNIT/ML injection Inject 20 Units into the skin at bedtime.  5  . metFORMIN (GLUCOPHAGE) 1000 MG tablet Take 1,000 mg by mouth 2 (two) times daily with a meal.    . OVER THE COUNTER MEDICATION Take 25 mg by mouth daily as needed  (allergies). Med Name: DIPHENHYDRAMINE    . potassium chloride SA (K-DUR,KLOR-CON) 20 MEQ tablet TAKE 1 TABLET BY MOUTH ONCE DAILY 90 tablet 3  . simvastatin (ZOCOR) 20 MG tablet Take one (1) tablet (20 mg total) by mouth on Mondays, Wednesdays, and Fridays.    . tamsulosin (FLOMAX) 0.4 MG CAPS capsule Take 1 capsule (0.4 mg total) by mouth daily after breakfast. 30 capsule 0  . pantoprazole (PROTONIX) 40 MG tablet Take  1 tablet (40 mg total) by mouth daily. 30 tablet 1   No current facility-administered medications for this encounter.    Allergies  Allergen Reactions  . Iohexol      Desc: HIVES S/P 13HR.PREMEDS, ?LOW DOSAGE PREMEDS   . Ivp Dye [Iodinated Diagnostic Agents]     welps       Social History   Social History  . Marital Status: Married    Spouse Name: N/A  . Number of Children: Y  . Years of Education: N/A   Occupational History  . retired    Social History Main Topics  . Smoking status: Never Smoker   . Smokeless tobacco: Never Used  . Alcohol Use: 0.0 oz/week    0 Standard drinks or equivalent per week     Comment: rare - 1-2x /yr  . Drug Use: No  . Sexual Activity: Not on file   Other Topics Concern  . Not on file   Social History Narrative      Family History  Problem Relation Age of Onset  . COPD Father   . Heart attack Sister     died age 42 of MI    Filed Vitals:   09/25/15 1223  BP: 134/72  Pulse: 86  Weight: 296 lb 8 oz (134.492 kg)  SpO2: 96%    PHYSICAL EXAM: General:  Well appearing. No respiratory difficulty HEENT: normal Neck: supple. no JVD. Carotids 2+ bilat; no bruits. No lymphadenopathy or thryomegaly appreciated. Cor: PMI nondisplaced. Irregular rate & rhythm. No rubs, gallops or murmurs. Lungs: clear Abdomen: markedly obese soft, nontender, nondistended. No hepatosplenomegaly. No bruits or masses. Good bowel sounds. Extremities: no cyanosis, clubbing, rash, 2+ edema Neuro: alert & oriented x 3, cranial nerves  grossly intact. moves all 4 extremities w/o difficulty. Affect pleasant.   ASSESSMENT & PLAN: 1. Worsening dyspnea on exertion 2. Acute on chronic diastolic HF 3. Morbid obesity 4. DM2 with recent foot ulcer 5. Diffuse coronary calcifications on CT chest  Agree with results of CPX. Dyspnea is multifactorial. Currently the most acute issue is volume overload. Will increase lasix to 80 daily. Meotalzone 2.5 mg x 2 days. Supp K+.  Secondary issues are severe obesity and deconditioning exacerbated by recent prolonged immobility due to diabetic foot ulcer last year. Have referred  to [facility] program. Also discussed trying to lose weight with [facility]. Once acute issues straightened out (or if getting worse) will eventually R/L cath.  Bensimhon, Daniel,MD 1:06 PM

## 2015-09-25 NOTE — Patient Instructions (Signed)
Take Lasix 80mg  daily.  Take metolazone 2.5 mg for two days with 78meq of potassium.  Start the [employer].  Labs in 1 week   Follow up in  4 weeks.

## 2015-09-26 ENCOUNTER — Other Ambulatory Visit (HOSPITAL_COMMUNITY): Payer: Self-pay | Admitting: *Deleted

## 2015-09-26 DIAGNOSIS — N182 Chronic kidney disease, stage 2 (mild): Secondary | ICD-10-CM

## 2015-09-26 DIAGNOSIS — I5033 Acute on chronic diastolic (congestive) heart failure: Secondary | ICD-10-CM | POA: Insufficient documentation

## 2015-09-26 MED ORDER — FUROSEMIDE 80 MG PO TABS: 80.0000 mg | ORAL_TABLET | Freq: Every day | ORAL | Status: DC

## 2015-10-01 ENCOUNTER — Ambulatory Visit (HOSPITAL_COMMUNITY)
Admission: RE | Admit: 2015-10-01 | Discharge: 2015-10-01 | Disposition: A | Discharge status: Home / Self Care | Payer: Medicare Other | Source: Ambulatory Visit | Attending: Cardiology | Admitting: Cardiology

## 2015-10-01 DIAGNOSIS — D509 Iron deficiency anemia, unspecified: Secondary | ICD-10-CM | POA: Diagnosis not present

## 2015-10-01 DIAGNOSIS — I5033 Acute on chronic diastolic (congestive) heart failure: Secondary | ICD-10-CM | POA: Insufficient documentation

## 2015-10-01 DIAGNOSIS — K219 Gastro-esophageal reflux disease without esophagitis: Secondary | ICD-10-CM | POA: Diagnosis not present

## 2015-10-01 DIAGNOSIS — D81819 Biotin-dependent carboxylase deficiency, unspecified: Secondary | ICD-10-CM | POA: Diagnosis not present

## 2015-10-01 LAB — BASIC METABOLIC PANEL
Anion gap: 13 (ref 5–15)
BUN: 36 mg/dL — AB (ref 6–20)
CHLORIDE: 98 mmol/L — AB (ref 101–111)
CO2: 28 mmol/L (ref 22–32)
CREATININE: 1.69 mg/dL — AB (ref 0.61–1.24)
Calcium: 9.5 mg/dL (ref 8.9–10.3)
GFR calc Af Amer: 46 mL/min — ABNORMAL LOW (ref >60)
GFR calc non Af Amer: 39 mL/min — ABNORMAL LOW (ref >60)
Glucose, Bld: 250 mg/dL — ABNORMAL HIGH (ref 65–99)
Potassium: 4.8 mmol/L (ref 3.5–5.1)
Sodium: 139 mmol/L (ref 135–145)

## 2015-10-05 ENCOUNTER — Telehealth: Payer: Self-pay | Admitting: Internal Medicine

## 2015-10-05 NOTE — Telephone Encounter (Signed)
Spoke with patient and informed him of his CPX results.  He states he follows with Dr Lamonte Sakai for pulmonary.  I have forwarded the test to him with Dr Tanna Furry recommendations

## 2015-10-05 NOTE — Telephone Encounter (Signed)
Returning your call. °

## 2015-10-05 NOTE — Telephone Encounter (Signed)
His stress show that he has a pulmonary limitation (restrictive lung disease and will need pulmonary followup), obesitiy limitation ( he and I have discussed weight loss on many occaisions) and cardiac limitation (deconditioning and hypertension). GT

## 2015-10-23 ENCOUNTER — Encounter (HOSPITAL_COMMUNITY): Payer: Self-pay | Admitting: Internal Medicine

## 2015-10-23 ENCOUNTER — Ambulatory Visit (HOSPITAL_COMMUNITY)
Admission: RE | Admit: 2015-10-23 | Discharge: 2015-10-23 | Disposition: A | Discharge status: Home / Self Care | Payer: Medicare Other | Source: Ambulatory Visit | Attending: Internal Medicine | Admitting: Internal Medicine

## 2015-10-23 VITALS — BP 130/72 | HR 72 | Wt 285.8 lb

## 2015-10-23 DIAGNOSIS — K219 Gastro-esophageal reflux disease without esophagitis: Secondary | ICD-10-CM | POA: Insufficient documentation

## 2015-10-23 DIAGNOSIS — Z8631 Personal history of diabetic foot ulcer: Secondary | ICD-10-CM | POA: Insufficient documentation

## 2015-10-23 DIAGNOSIS — I5032 Chronic diastolic (congestive) heart failure: Secondary | ICD-10-CM | POA: Diagnosis not present

## 2015-10-23 DIAGNOSIS — M109 Gout, unspecified: Secondary | ICD-10-CM | POA: Diagnosis not present

## 2015-10-23 DIAGNOSIS — I2510 Atherosclerotic heart disease of native coronary artery without angina pectoris: Secondary | ICD-10-CM | POA: Insufficient documentation

## 2015-10-23 DIAGNOSIS — Z8249 Family history of ischemic heart disease and other diseases of the circulatory system: Secondary | ICD-10-CM | POA: Insufficient documentation

## 2015-10-23 DIAGNOSIS — Z79899 Other long term (current) drug therapy: Secondary | ICD-10-CM | POA: Insufficient documentation

## 2015-10-23 DIAGNOSIS — Z8673 Personal history of transient ischemic attack (TIA), and cerebral infarction without residual deficits: Secondary | ICD-10-CM | POA: Insufficient documentation

## 2015-10-23 DIAGNOSIS — I110 Hypertensive heart disease with heart failure: Secondary | ICD-10-CM | POA: Insufficient documentation

## 2015-10-23 DIAGNOSIS — Z7901 Long term (current) use of anticoagulants: Secondary | ICD-10-CM | POA: Diagnosis not present

## 2015-10-23 DIAGNOSIS — Z91041 Radiographic dye allergy status: Secondary | ICD-10-CM | POA: Diagnosis not present

## 2015-10-23 DIAGNOSIS — Z825 Family history of asthma and other chronic lower respiratory diseases: Secondary | ICD-10-CM | POA: Insufficient documentation

## 2015-10-23 DIAGNOSIS — I482 Chronic atrial fibrillation, unspecified: Secondary | ICD-10-CM

## 2015-10-23 DIAGNOSIS — G4733 Obstructive sleep apnea (adult) (pediatric): Secondary | ICD-10-CM | POA: Insufficient documentation

## 2015-10-23 DIAGNOSIS — E119 Type 2 diabetes mellitus without complications: Secondary | ICD-10-CM | POA: Insufficient documentation

## 2015-10-23 DIAGNOSIS — R0609 Other forms of dyspnea: Secondary | ICD-10-CM | POA: Diagnosis not present

## 2015-10-23 DIAGNOSIS — Z794 Long term (current) use of insulin: Secondary | ICD-10-CM | POA: Diagnosis not present

## 2015-10-23 LAB — BASIC METABOLIC PANEL
ANION GAP: 14 (ref 5–15)
BUN: 31 mg/dL — ABNORMAL HIGH (ref 6–20)
CALCIUM: 9.2 mg/dL (ref 8.9–10.3)
CHLORIDE: 100 mmol/L — AB (ref 101–111)
CO2: 25 mmol/L (ref 22–32)
Creatinine, Ser: 1.5 mg/dL — ABNORMAL HIGH (ref 0.61–1.24)
GFR calc Af Amer: 53 mL/min — ABNORMAL LOW (ref >60)
GFR calc non Af Amer: 45 mL/min — ABNORMAL LOW (ref >60)
GLUCOSE: 380 mg/dL — AB (ref 65–99)
POTASSIUM: 4.9 mmol/L (ref 3.5–5.1)
Sodium: 139 mmol/L (ref 135–145)

## 2015-10-23 MED ORDER — METOLAZONE 2.5 MG PO TABS: ORAL_TABLET | ORAL | Status: DC

## 2015-10-23 NOTE — Patient Instructions (Signed)
Routine lab work today. Will notify you of abnormal results  Follow up in 2 months with Dr.Bensimhon

## 2015-10-23 NOTE — Progress Notes (Signed)
Patient ID: Oscar [name], male   DOB: 18-May-1946, 70 y.o.   MRN: [id number]    Advanced Heart Failure Clinic Note   Referring Physician: Lovena Le   HPI:  Oscar [name] is a 70 y.o. male with h/o morbid obesity, DM2, HTN, permanent AF, OSA on CPAP and diastolic HF. Referred by Dr. Lovena Le for further evaluation of worsening dyspnea.   Developed PAF about 2 years ago. He has been in AF since that time and has been relatively asymptomatic. February 2016 had a diabetic foot ulcer on the right heel with gangrene. Was operated on by Dr. Sharol Given and had limited mobility for over 8 months with only bed to chair activity.   He presents for HF follow up today.  At last visit had 4 months of worsening SOB and fatigue. Increased lasix to 29m daily. Down 11 lbs since last visit. Feeling better overall. Starts [employer] to starting with HF program on Friday. He says his breathing has improved. Continues to produce lots of urine on lasix.  At last visit was SOB walking to car.  Now can walk grocery store without difficulty. Does still have mild orthopnea. Takes metolazones every Wednesday. Weight at home 274-279.  Studies:  Last echo 11/16 - Left ventricle: The cavity size was normal. Systolic function was normal. The estimated ejection fraction was in the range of 55% to 60%. Wall motion was normal; there were no regional wall motion abnormalities. - Aortic valve: There was trivial regurgitation. - Mitral valve: Calcified annulus. There was mild regurgitation. - Left atrium: The atrium was moderately dilated. - Right atrium: The atrium was mildly dilated. - Atrial septum: No defect or patent foramen ovale was identified. - Pulmonary arteries: PA peak pressure: 37 mm Hg (S).  CPX 2/17  FVC 2.11 (53%)    FEV1 1.55 (50%)     FEV1/FVC 74 (95%)     MVV 76 (61%)  Resting HR: 107 Peak HR: 136  (91% age predicted max HR) BP rest: 136/70 BP peak: 146/68 Peak VO2: 10.4 (56% predicted  peak VO2) VE/VCO2 slope: 29.8 OUES: 1 Peak RER: 1.14 Ventilatory Threshold: 7.9 (42.5% predicted or measured peak VO2) Peak RR 24 Peak Ventilation: 45.9 VE/MVV: 60% PETCO2 at peak: 36 O2pulse: 10  (59% predicted O2pulse)  Conclusion: Exercise testing with gas exchange demonstrates a severe functional impairment when compared to matched sedentary norms. The limitation appears to be primarily due to the patients severe obesity and related restrictive lung physiology. However, the blunted BP response to exercise and low O2 pulse suggest a concomitant circulatory limitation which is likely related to his diastolic HF and atrial fibrillation.    Past Medical History  Diagnosis Date  . Diabetes mellitus without complication ([place]     improved after diet/exercise  . Hypertension     improved after diet/exercise  . Kidney stones   . GERD (gastroesophageal reflux disease)   . H/O hiatal hernia   . Arthritis     hips  . OSA (obstructive sleep apnea)     wears cpap  . Anal fissure     h/o - no recent complications  . Atrial fibrillation ([place]   . Gout   . Stroke [facility]/2013    short term memory loss (slight)  . Hx of typhoid fever     as a child  . Enlarged prostate   . PONV (postoperative nausea and vomiting)     also difficulty waking up    Current Outpatient Prescriptions  Medication Sig Dispense Refill  . allopurinol (ZYLOPRIM) 300 MG tablet Take 300 mg by mouth 3 (three) times daily as needed. (GOUT)  12  . blood glucose meter kit and supplies KIT Dispense based on patient and insurance preference. Use up to four times daily as directed. (FOR ICD-9 250.00, 250.01). 1 each 0  . diltiazem (CARDIZEM CD) 360 MG 24 hr capsule Take 1 capsule (360 mg total) by mouth daily. 90 capsule 0  . ELIQUIS 5 MG TABS tablet TAKE 1 TABLET(5 MG) BY MOUTH TWICE DAILY 180 tablet 1  . escitalopram (LEXAPRO) 10 MG tablet Take 10 mg by mouth at bedtime.     . Febuxostat (ULORIC) 80 MG  TABS Take 80 mg by mouth daily.    . furosemide (LASIX) 80 MG tablet Take 1 tablet (80 mg total) by mouth daily. T 90 tablet 3  . glyBURIDE (DIABETA) 5 MG tablet Take 5 mg by mouth 2 (two) times daily with a meal.    . insulin aspart (NOVOLOG) 100 UNIT/ML injection Inject 0-9 Units into the skin 3 (three) times daily as needed for high blood sugar.    Marland Kitchen LEVEMIR 100 UNIT/ML injection Inject 20 Units into the skin at bedtime.  5  . metFORMIN (GLUCOPHAGE) 1000 MG tablet Take 1,000 mg by mouth 2 (two) times daily with a meal.    . metolazone (ZAROXOLYN) 2.5 MG tablet Take 1 tablet (2.5 mg total) by mouth daily. 10 tablet 3  . pantoprazole (PROTONIX) 40 MG tablet Take 1 tablet (40 mg total) by mouth daily. 30 tablet 1  . potassium chloride SA (K-DUR,KLOR-CON) 20 MEQ tablet TAKE 1 TABLET BY MOUTH ONCE DAILY 90 tablet 3  . simvastatin (ZOCOR) 20 MG tablet Take one (1) tablet (20 mg total) by mouth on Mondays, Wednesdays, and Fridays.    . tamsulosin (FLOMAX) 0.4 MG CAPS capsule Take 1 capsule (0.4 mg total) by mouth daily after breakfast. 30 capsule 0  . acetaminophen (TYLENOL) 500 MG tablet Take 1,500 mg by mouth every 6 (six) hours as needed for moderate pain. Reported on 10/23/2015    . indomethacin (INDOCIN) 50 MG capsule Take 50 mg by mouth 3 (three) times daily as needed (for gout flare ups). Reported on 10/23/2015    . OVER THE COUNTER MEDICATION Take 25 mg by mouth daily as needed (allergies). Reported on 10/23/2015     No current facility-administered medications for this encounter.    Allergies  Allergen Reactions  . Iohexol      Desc: HIVES S/P 13HR.PREMEDS, ?LOW DOSAGE PREMEDS   . Ivp Dye [Iodinated Diagnostic Agents]     welps       Social History   Social History  . Marital Status: Married    Spouse Name: N/A  . Number of Children: Y  . Years of Education: N/A   Occupational History  . retired    Social History Main Topics  . Smoking status: Never Smoker   . Smokeless  tobacco: Never Used  . Alcohol Use: 0.0 oz/week    0 Standard drinks or equivalent per week     Comment: rare - 1-2x /yr  . Drug Use: No  . Sexual Activity: Not on file   Other Topics Concern  . Not on file   Social History Narrative      Family History  Problem Relation Age of Onset  . COPD Father   . Heart attack Sister     died age 32 of MI  Filed Vitals:   10/23/15 1142  BP: 130/72  Pulse: 72  Weight: 285 lb 12 oz (129.615 kg)  SpO2: 97%   Wt Readings from Last 3 Encounters:  10/23/15 285 lb 12 oz (129.615 kg)  09/25/15 296 lb 8 oz (134.492 kg)  09/05/15 294 lb 12.8 oz (133.72 kg)     PHYSICAL EXAM: General:  Well appearing. No respiratory difficulty HEENT: normal Neck: supple. no JVD. Carotids 2+ bilat; no bruits. No thyromegaly or nodule noted. Cor: PMI nondisplaced. Irregular rate & rhythm. No M/G/R Lungs: CTAB, normal effort Abdomen: markedly obese, soft, NT, ND, no HSM. No bruits or masses. +BS  Extremities: no cyanosis, clubbing, rash, trace edema Neuro: alert & oriented x 3, cranial nerves grossly intact. moves all 4 extremities w/o difficulty. Affect pleasant.   ASSESSMENT & PLAN: 1. Dyspnea on exertion 2. Chronic diastolic HF 3. Morbid obesity 4. DM2 with recent foot ulcer 5. Diffuse coronary calcifications on CT chest  Doing much better. Continue Lasix 80 mg daily with metolazone 2.5 mg every Wednesday.  OK to hold or take an extra during the week as needed ( no more than twice weekly)  Encouraged to continue to get involved with [facility] HF program. Reassess in 2 months ref invasive work up [facility])  Encouraged to increase activity as able and work on portion control.   Shirley Friar, PA-C 12:15 PM  Patient seen and examined with Oda Kilts, PA-C. We discussed all aspects of the encounter. I agree with the assessment and plan as stated above.   Dramatic symptom improvement with diuresis. Will continue current regimen.  Reinforced need for daily weights and reviewed use of sliding scale diuretics. Reinforced need for daily weights and reviewed use of sliding scale diuretics.  Encouraged him to be more active and participate in [facility] program. Will defer cath for now but would have low threshold to pursue given diffuse coronary calcium on echo.   Karo Rog,MD 11:27 PM

## 2015-10-28 DIAGNOSIS — I2510 Atherosclerotic heart disease of native coronary artery without angina pectoris: Secondary | ICD-10-CM | POA: Insufficient documentation

## 2015-10-29 ENCOUNTER — Ambulatory Visit (INDEPENDENT_AMBULATORY_CARE_PROVIDER_SITE_OTHER): Payer: Medicare Other | Admitting: Ophthalmology

## 2015-11-04 ENCOUNTER — Other Ambulatory Visit: Payer: Self-pay | Admitting: Internal Medicine

## 2015-11-12 ENCOUNTER — Ambulatory Visit (INDEPENDENT_AMBULATORY_CARE_PROVIDER_SITE_OTHER): Payer: Medicare Other | Admitting: Ophthalmology

## 2015-11-12 DIAGNOSIS — H43813 Vitreous degeneration, bilateral: Secondary | ICD-10-CM

## 2015-11-12 DIAGNOSIS — E11311 Type 2 diabetes mellitus with unspecified diabetic retinopathy with macular edema: Secondary | ICD-10-CM

## 2015-11-12 DIAGNOSIS — E113313 Type 2 diabetes mellitus with moderate nonproliferative diabetic retinopathy with macular edema, bilateral: Secondary | ICD-10-CM

## 2015-11-12 DIAGNOSIS — H35033 Hypertensive retinopathy, bilateral: Secondary | ICD-10-CM | POA: Diagnosis not present

## 2015-11-12 DIAGNOSIS — I10 Essential (primary) hypertension: Secondary | ICD-10-CM

## 2015-12-10 ENCOUNTER — Encounter (INDEPENDENT_AMBULATORY_CARE_PROVIDER_SITE_OTHER): Payer: Medicare Other | Admitting: Ophthalmology

## 2015-12-10 DIAGNOSIS — H43813 Vitreous degeneration, bilateral: Secondary | ICD-10-CM | POA: Diagnosis not present

## 2015-12-10 DIAGNOSIS — H35033 Hypertensive retinopathy, bilateral: Secondary | ICD-10-CM | POA: Diagnosis not present

## 2015-12-10 DIAGNOSIS — E113313 Type 2 diabetes mellitus with moderate nonproliferative diabetic retinopathy with macular edema, bilateral: Secondary | ICD-10-CM

## 2015-12-10 DIAGNOSIS — E11311 Type 2 diabetes mellitus with unspecified diabetic retinopathy with macular edema: Secondary | ICD-10-CM

## 2015-12-10 DIAGNOSIS — I10 Essential (primary) hypertension: Secondary | ICD-10-CM | POA: Diagnosis not present

## 2015-12-20 DIAGNOSIS — H35031 Hypertensive retinopathy, right eye: Secondary | ICD-10-CM | POA: Diagnosis not present

## 2015-12-20 DIAGNOSIS — H35032 Hypertensive retinopathy, left eye: Secondary | ICD-10-CM | POA: Diagnosis not present

## 2015-12-20 DIAGNOSIS — H25041 Posterior subcapsular polar age-related cataract, right eye: Secondary | ICD-10-CM | POA: Diagnosis not present

## 2015-12-20 DIAGNOSIS — E113312 Type 2 diabetes mellitus with moderate nonproliferative diabetic retinopathy with macular edema, left eye: Secondary | ICD-10-CM | POA: Diagnosis not present

## 2015-12-20 DIAGNOSIS — E113311 Type 2 diabetes mellitus with moderate nonproliferative diabetic retinopathy with macular edema, right eye: Secondary | ICD-10-CM | POA: Diagnosis not present

## 2015-12-20 DIAGNOSIS — H25011 Cortical age-related cataract, right eye: Secondary | ICD-10-CM | POA: Diagnosis not present

## 2015-12-20 DIAGNOSIS — H2512 Age-related nuclear cataract, left eye: Secondary | ICD-10-CM | POA: Diagnosis not present

## 2015-12-20 DIAGNOSIS — H2511 Age-related nuclear cataract, right eye: Secondary | ICD-10-CM | POA: Diagnosis not present

## 2015-12-27 ENCOUNTER — Ambulatory Visit (HOSPITAL_COMMUNITY)
Admission: RE | Admit: 2015-12-27 | Discharge: 2015-12-27 | Disposition: A | Discharge status: Home / Self Care | Payer: Medicare Other | Source: Ambulatory Visit | Attending: Internal Medicine | Admitting: Internal Medicine

## 2015-12-27 ENCOUNTER — Encounter (HOSPITAL_COMMUNITY): Payer: Self-pay | Admitting: Internal Medicine

## 2015-12-27 VITALS — BP 134/78 | HR 89 | Wt 289.5 lb

## 2015-12-27 DIAGNOSIS — I2510 Atherosclerotic heart disease of native coronary artery without angina pectoris: Secondary | ICD-10-CM | POA: Insufficient documentation

## 2015-12-27 DIAGNOSIS — Z8249 Family history of ischemic heart disease and other diseases of the circulatory system: Secondary | ICD-10-CM | POA: Insufficient documentation

## 2015-12-27 DIAGNOSIS — E119 Type 2 diabetes mellitus without complications: Secondary | ICD-10-CM | POA: Diagnosis not present

## 2015-12-27 DIAGNOSIS — Z794 Long term (current) use of insulin: Secondary | ICD-10-CM | POA: Diagnosis not present

## 2015-12-27 DIAGNOSIS — Z825 Family history of asthma and other chronic lower respiratory diseases: Secondary | ICD-10-CM | POA: Insufficient documentation

## 2015-12-27 DIAGNOSIS — K219 Gastro-esophageal reflux disease without esophagitis: Secondary | ICD-10-CM | POA: Insufficient documentation

## 2015-12-27 DIAGNOSIS — Z8631 Personal history of diabetic foot ulcer: Secondary | ICD-10-CM | POA: Diagnosis not present

## 2015-12-27 DIAGNOSIS — I482 Chronic atrial fibrillation: Secondary | ICD-10-CM | POA: Insufficient documentation

## 2015-12-27 DIAGNOSIS — M109 Gout, unspecified: Secondary | ICD-10-CM | POA: Diagnosis not present

## 2015-12-27 DIAGNOSIS — Z91041 Radiographic dye allergy status: Secondary | ICD-10-CM | POA: Diagnosis not present

## 2015-12-27 DIAGNOSIS — Z7901 Long term (current) use of anticoagulants: Secondary | ICD-10-CM | POA: Diagnosis not present

## 2015-12-27 DIAGNOSIS — G4733 Obstructive sleep apnea (adult) (pediatric): Secondary | ICD-10-CM | POA: Insufficient documentation

## 2015-12-27 DIAGNOSIS — Z8673 Personal history of transient ischemic attack (TIA), and cerebral infarction without residual deficits: Secondary | ICD-10-CM | POA: Diagnosis not present

## 2015-12-27 DIAGNOSIS — I5032 Chronic diastolic (congestive) heart failure: Secondary | ICD-10-CM

## 2015-12-27 DIAGNOSIS — I110 Hypertensive heart disease with heart failure: Secondary | ICD-10-CM | POA: Insufficient documentation

## 2015-12-27 DIAGNOSIS — Z79899 Other long term (current) drug therapy: Secondary | ICD-10-CM | POA: Diagnosis not present

## 2015-12-27 LAB — BASIC METABOLIC PANEL
Anion gap: 16 — ABNORMAL HIGH (ref 5–15)
BUN: 27 mg/dL — ABNORMAL HIGH (ref 6–20)
CALCIUM: 9.3 mg/dL (ref 8.9–10.3)
CHLORIDE: 95 mmol/L — AB (ref 101–111)
CO2: 27 mmol/L (ref 22–32)
Creatinine, Ser: 1.41 mg/dL — ABNORMAL HIGH (ref 0.61–1.24)
GFR calc Af Amer: 57 mL/min — ABNORMAL LOW (ref >60)
GFR, EST NON AFRICAN AMERICAN: 49 mL/min — AB (ref >60)
GLUCOSE: 374 mg/dL — AB (ref 65–99)
Potassium: 3.6 mmol/L (ref 3.5–5.1)
SODIUM: 138 mmol/L (ref 135–145)

## 2015-12-27 NOTE — Progress Notes (Signed)
Patient ID: Oscar [name], male   DOB: [birth year], 70 y.o.   MRN: [id number]    Advanced Heart Failure Clinic Note   Referring Physician: Lovena Le   HPI:  Oscar [name] is a 70 y.o. male with h/o morbid obesity, DM2, HTN, permanent AF, OSA on CPAP and diastolic HF. Referred by Dr. Lovena Le for further evaluation of worsening dyspnea.   Developed PAF about 2 years ago. He has been in AF since that time and has been relatively asymptomatic. February 2016 had a diabetic foot ulcer on the right heel with gangrene. Was operated on by Dr. Sharol Given and had limited mobility for over 8 months with only bed to chair activity.   He presents for HF follow up today.  Continues to do well. Keeping fluid off. Not weighing every day. Taking lasix 80 daily and metolazone once a week with good effect. Had to take metolazone 2x in a week once. No edema, orthopnea or PND. Mild DOE. Going to [employer] occasionally but "not often enough."    Studies:  Last echo 11/16 - Left ventricle: The cavity size was normal. Systolic function was normal. The estimated ejection fraction was in the range of 55% to 60%. Wall motion was normal; there were no regional wall motion abnormalities. - Aortic valve: There was trivial regurgitation. - Mitral valve: Calcified annulus. There was mild regurgitation. - Left atrium: The atrium was moderately dilated. - Right atrium: The atrium was mildly dilated. - Atrial septum: No defect or patent foramen ovale was identified. - Pulmonary arteries: PA peak pressure: 37 mm Hg (S).  CPX 2/17  FVC 2.11 (53%)    FEV1 1.55 (50%)     FEV1/FVC 74 (95%)     MVV 76 (61%)  Resting HR: 107 Peak HR: 136  (91% age predicted max HR) BP rest: 136/70 BP peak: 146/68 Peak VO2: 10.4 (56% predicted peak VO2) VE/VCO2 slope: 29.8 OUES: 1 Peak RER: 1.14 Ventilatory Threshold: 7.9 (42.5% predicted or measured peak VO2) Peak RR 24 Peak Ventilation: 45.9 VE/MVV: 60% PETCO2 at peak:  36 O2pulse: 10  (59% predicted O2pulse)  Conclusion: Exercise testing with gas exchange demonstrates a severe functional impairment when compared to matched sedentary norms. The limitation appears to be primarily due to the patients severe obesity and related restrictive lung physiology. However, the blunted BP response to exercise and low O2 pulse suggest a concomitant circulatory limitation which is likely related to his diastolic HF and atrial fibrillation.    Past Medical History  Diagnosis Date  . Diabetes mellitus without complication ([place])     improved after diet/exercise  . Hypertension     improved after diet/exercise  . Kidney stones   . GERD (gastroesophageal reflux disease)   . H/O hiatal hernia   . Arthritis     hips  . OSA (obstructive sleep apnea)     wears cpap  . Anal fissure     h/o - no recent complications  . Atrial fibrillation ([place])   . Gout   . Stroke [facility]) 08/2013    short term memory loss (slight)  . Hx of typhoid fever     as a child  . Enlarged prostate   . PONV (postoperative nausea and vomiting)     also difficulty waking up    Current Outpatient Prescriptions  Medication Sig Dispense Refill  . acetaminophen (TYLENOL) 500 MG tablet Take 1,500 mg by mouth every 6 (six) hours as needed for moderate pain. Reported on 10/23/2015    .  blood glucose meter kit and supplies KIT Dispense based on patient and insurance preference. Use up to four times daily as directed. (FOR ICD-9 250.00, 250.01). 1 each 0  . diltiazem (CARDIZEM CD) 360 MG 24 hr capsule Take 1 capsule (360 mg total) by mouth daily. 90 capsule 0  . ELIQUIS 5 MG TABS tablet TAKE 1 TABLET(5 MG) BY MOUTH TWICE DAILY 180 tablet 1  . escitalopram (LEXAPRO) 10 MG tablet Take 10 mg by mouth at bedtime.     . Febuxostat (ULORIC) 80 MG TABS Take 80 mg by mouth daily.    . furosemide (LASIX) 80 MG tablet Take 1 tablet (80 mg total) by mouth daily. T 90 tablet 3  . glyBURIDE (DIABETA) 5 MG  tablet Take 5 mg by mouth 2 (two) times daily with a meal.    . insulin aspart (NOVOLOG) 100 UNIT/ML injection Inject 0-9 Units into the skin 3 (three) times daily as needed for high blood sugar.    Marland Kitchen LEVEMIR 100 UNIT/ML injection Inject 20 Units into the skin at bedtime.  5  . metFORMIN (GLUCOPHAGE) 1000 MG tablet Take 1,000 mg by mouth 2 (two) times daily with a meal.    . metolazone (ZAROXOLYN) 2.5 MG tablet Take 1 tablet every Wednesday. 10 tablet 3  . OVER THE COUNTER MEDICATION Take 25 mg by mouth daily as needed (allergies). Reported on 10/23/2015    . pantoprazole (PROTONIX) 40 MG tablet Take 1 tablet (40 mg total) by mouth daily. 30 tablet 1  . potassium chloride SA (K-DUR,KLOR-CON) 20 MEQ tablet TAKE 1 TABLET BY MOUTH EVERY DAY 90 tablet 3  . simvastatin (ZOCOR) 20 MG tablet Take one (1) tablet (20 mg total) by mouth on Mondays, Wednesdays, and Fridays.    . tamsulosin (FLOMAX) 0.4 MG CAPS capsule Take 1 capsule (0.4 mg total) by mouth daily after breakfast. 30 capsule 0  . allopurinol (ZYLOPRIM) 300 MG tablet Take 300 mg by mouth 3 (three) times daily as needed. Reported on 12/27/2015  12  . indomethacin (INDOCIN) 50 MG capsule Take 50 mg by mouth 3 (three) times daily as needed (for gout flare ups). Reported on 12/27/2015     No current facility-administered medications for this encounter.    Allergies  Allergen Reactions  . Iohexol      Desc: HIVES S/P 13HR.PREMEDS, ?LOW DOSAGE PREMEDS   . Ivp Dye [Iodinated Diagnostic Agents]     welps       Social History   Social History  . Marital Status: Married    Spouse Name: N/A  . Number of Children: Y  . Years of Education: N/A   Occupational History  . retired    Social History Main Topics  . Smoking status: Never Smoker   . Smokeless tobacco: Never Used  . Alcohol Use: 0.0 oz/week    0 Standard drinks or equivalent per week     Comment: rare - 1-2x /yr  . Drug Use: No  . Sexual Activity: Not on file   Other Topics  Concern  . Not on file   Social History Narrative      Family History  Problem Relation Age of Onset  . COPD Father   . Heart attack Sister     died age 41 of MI    Filed Vitals:   12/27/15 1128  BP: 134/78  Pulse: 89  Weight: 289 lb 8 oz (131.316 kg)  SpO2: 97%   Wt Readings from Last 3 Encounters:  12/27/15 289 lb 8 oz (131.316 kg)  10/23/15 285 lb 12 oz (129.615 kg)  09/25/15 296 lb 8 oz (134.492 kg)     PHYSICAL EXAM: General:  Well appearing. No respiratory difficulty HEENT: normal Neck: supple. no JVD. Carotids 2+ bilat; no bruits. No thyromegaly or nodule noted. Cor: PMI nondisplaced. Irregular rate & rhythm. No M/G/R Lungs: CTAB, normal effort Abdomen: obese, soft, NT, ND, no HSM. No bruits or masses. +BS  Extremities: no cyanosis, clubbing, rash, trace edema Neuro: alert & oriented x 3, cranial nerves grossly intact. moves all 4 extremities w/o difficulty. Affect pleasant.   ASSESSMENT & PLAN: 1. Chronic diastolic HF 2. Morbid obesity 3. DM2 with recent foot ulcer 4. Diffuse coronary calcifications on CT chest  Doing very well. Volume status stable. Reinforced need for daily weights and reviewed use of sliding scale diuretics.  Encouraged him to be more active and participate in [facility] program more. Will defer cath for now but would have low threshold to pursue given diffuse coronary calcium on CT.   Kikuye Korenek,MD 11:53 AM

## 2015-12-27 NOTE — Addendum Note (Signed)
Encounter addended by: Harvie Junior, CMA on: 12/27/2015 12:19 PM<BR>     Documentation filed: Dx Association, Patient Instructions Section, Orders

## 2015-12-27 NOTE — Patient Instructions (Signed)
Routine lab work today. Will notify you of abnormal results  Follow up with Dr.Bensimhon in 4 months.  

## 2016-01-14 ENCOUNTER — Encounter (INDEPENDENT_AMBULATORY_CARE_PROVIDER_SITE_OTHER): Payer: Medicare Other | Admitting: Ophthalmology

## 2016-01-14 DIAGNOSIS — I10 Essential (primary) hypertension: Secondary | ICD-10-CM | POA: Diagnosis not present

## 2016-01-14 DIAGNOSIS — E11311 Type 2 diabetes mellitus with unspecified diabetic retinopathy with macular edema: Secondary | ICD-10-CM | POA: Diagnosis not present

## 2016-01-14 DIAGNOSIS — E113391 Type 2 diabetes mellitus with moderate nonproliferative diabetic retinopathy without macular edema, right eye: Secondary | ICD-10-CM

## 2016-01-14 DIAGNOSIS — H43813 Vitreous degeneration, bilateral: Secondary | ICD-10-CM | POA: Diagnosis not present

## 2016-01-14 DIAGNOSIS — H2513 Age-related nuclear cataract, bilateral: Secondary | ICD-10-CM | POA: Diagnosis not present

## 2016-01-14 DIAGNOSIS — E113312 Type 2 diabetes mellitus with moderate nonproliferative diabetic retinopathy with macular edema, left eye: Secondary | ICD-10-CM

## 2016-01-14 DIAGNOSIS — H35033 Hypertensive retinopathy, bilateral: Secondary | ICD-10-CM | POA: Diagnosis not present

## 2016-01-28 ENCOUNTER — Other Ambulatory Visit (HOSPITAL_COMMUNITY): Payer: Self-pay | Admitting: *Deleted

## 2016-01-28 DIAGNOSIS — N182 Chronic kidney disease, stage 2 (mild): Secondary | ICD-10-CM

## 2016-01-28 MED ORDER — FUROSEMIDE 80 MG PO TABS: 80.0000 mg | ORAL_TABLET | Freq: Every day | ORAL | Status: AC

## 2016-01-29 DIAGNOSIS — H2511 Age-related nuclear cataract, right eye: Secondary | ICD-10-CM | POA: Diagnosis not present

## 2016-02-28 ENCOUNTER — Encounter (INDEPENDENT_AMBULATORY_CARE_PROVIDER_SITE_OTHER): Payer: Medicare Other | Admitting: Ophthalmology

## 2016-03-07 ENCOUNTER — Other Ambulatory Visit: Payer: Self-pay | Admitting: Internal Medicine

## 2016-04-02 ENCOUNTER — Encounter (INDEPENDENT_AMBULATORY_CARE_PROVIDER_SITE_OTHER): Payer: Medicare Other | Admitting: Ophthalmology

## 2016-04-02 DIAGNOSIS — H43813 Vitreous degeneration, bilateral: Secondary | ICD-10-CM

## 2016-04-02 DIAGNOSIS — H2512 Age-related nuclear cataract, left eye: Secondary | ICD-10-CM | POA: Diagnosis not present

## 2016-04-02 DIAGNOSIS — I10 Essential (primary) hypertension: Secondary | ICD-10-CM | POA: Diagnosis not present

## 2016-04-02 DIAGNOSIS — H35033 Hypertensive retinopathy, bilateral: Secondary | ICD-10-CM | POA: Diagnosis not present

## 2016-04-02 DIAGNOSIS — E113391 Type 2 diabetes mellitus with moderate nonproliferative diabetic retinopathy without macular edema, right eye: Secondary | ICD-10-CM | POA: Diagnosis not present

## 2016-04-02 DIAGNOSIS — E11311 Type 2 diabetes mellitus with unspecified diabetic retinopathy with macular edema: Secondary | ICD-10-CM

## 2016-04-02 DIAGNOSIS — E113312 Type 2 diabetes mellitus with moderate nonproliferative diabetic retinopathy with macular edema, left eye: Secondary | ICD-10-CM | POA: Diagnosis not present

## 2016-04-09 ENCOUNTER — Other Ambulatory Visit (HOSPITAL_COMMUNITY): Payer: Self-pay | Admitting: *Deleted

## 2016-04-09 MED ORDER — POTASSIUM CHLORIDE CRYS ER 20 MEQ PO TBCR: 20.0000 meq | EXTENDED_RELEASE_TABLET | Freq: Every day | ORAL | 3 refills | Status: DC

## 2016-04-10 ENCOUNTER — Other Ambulatory Visit (HOSPITAL_COMMUNITY): Payer: Self-pay | Admitting: Cardiology

## 2016-04-10 NOTE — Telephone Encounter (Signed)
Patients wife called to request refill on potassium Wife states he runs out early because script is written for once daily, states they were told at last OV to increase K to BID,  Ok to refill with BID instructions  Labs done 12/27/15 K 3.6 Cr 1.41

## 2016-04-11 MED ORDER — POTASSIUM CHLORIDE CRYS ER 20 MEQ PO TBCR: 20.0000 meq | EXTENDED_RELEASE_TABLET | Freq: Every day | ORAL | 3 refills | Status: DC

## 2016-04-28 DIAGNOSIS — H2512 Age-related nuclear cataract, left eye: Secondary | ICD-10-CM | POA: Diagnosis not present

## 2016-04-28 DIAGNOSIS — H31013 Macula scars of posterior pole (postinflammatory) (post-traumatic), bilateral: Secondary | ICD-10-CM | POA: Diagnosis not present

## 2016-04-28 DIAGNOSIS — H35031 Hypertensive retinopathy, right eye: Secondary | ICD-10-CM | POA: Diagnosis not present

## 2016-04-30 ENCOUNTER — Encounter (INDEPENDENT_AMBULATORY_CARE_PROVIDER_SITE_OTHER): Payer: Medicare Other | Admitting: Ophthalmology

## 2016-04-30 DIAGNOSIS — H43813 Vitreous degeneration, bilateral: Secondary | ICD-10-CM

## 2016-04-30 DIAGNOSIS — E113391 Type 2 diabetes mellitus with moderate nonproliferative diabetic retinopathy without macular edema, right eye: Secondary | ICD-10-CM

## 2016-04-30 DIAGNOSIS — E113312 Type 2 diabetes mellitus with moderate nonproliferative diabetic retinopathy with macular edema, left eye: Secondary | ICD-10-CM

## 2016-04-30 DIAGNOSIS — I10 Essential (primary) hypertension: Secondary | ICD-10-CM | POA: Diagnosis not present

## 2016-04-30 DIAGNOSIS — E11311 Type 2 diabetes mellitus with unspecified diabetic retinopathy with macular edema: Secondary | ICD-10-CM

## 2016-04-30 DIAGNOSIS — H35033 Hypertensive retinopathy, bilateral: Secondary | ICD-10-CM | POA: Diagnosis not present

## 2016-05-08 DIAGNOSIS — L570 Actinic keratosis: Secondary | ICD-10-CM | POA: Diagnosis not present

## 2016-05-08 DIAGNOSIS — L300 Nummular dermatitis: Secondary | ICD-10-CM | POA: Diagnosis not present

## 2016-05-08 DIAGNOSIS — D225 Melanocytic nevi of trunk: Secondary | ICD-10-CM | POA: Diagnosis not present

## 2016-05-08 DIAGNOSIS — L814 Other melanin hyperpigmentation: Secondary | ICD-10-CM | POA: Diagnosis not present

## 2016-05-08 DIAGNOSIS — L821 Other seborrheic keratosis: Secondary | ICD-10-CM | POA: Diagnosis not present

## 2016-05-08 DIAGNOSIS — D2271 Melanocytic nevi of right lower limb, including hip: Secondary | ICD-10-CM | POA: Diagnosis not present

## 2016-05-08 DIAGNOSIS — C44612 Basal cell carcinoma of skin of right upper limb, including shoulder: Secondary | ICD-10-CM | POA: Diagnosis not present

## 2016-05-08 DIAGNOSIS — Z85828 Personal history of other malignant neoplasm of skin: Secondary | ICD-10-CM | POA: Diagnosis not present

## 2016-05-08 DIAGNOSIS — D2272 Melanocytic nevi of left lower limb, including hip: Secondary | ICD-10-CM | POA: Diagnosis not present

## 2016-05-13 DIAGNOSIS — H25012 Cortical age-related cataract, left eye: Secondary | ICD-10-CM | POA: Diagnosis not present

## 2016-05-13 DIAGNOSIS — H5202 Hypermetropia, left eye: Secondary | ICD-10-CM | POA: Diagnosis not present

## 2016-05-13 DIAGNOSIS — H16149 Punctate keratitis, unspecified eye: Secondary | ICD-10-CM | POA: Diagnosis not present

## 2016-05-13 DIAGNOSIS — H52223 Regular astigmatism, bilateral: Secondary | ICD-10-CM | POA: Diagnosis not present

## 2016-05-13 DIAGNOSIS — H25812 Combined forms of age-related cataract, left eye: Secondary | ICD-10-CM | POA: Diagnosis not present

## 2016-05-13 DIAGNOSIS — H2512 Age-related nuclear cataract, left eye: Secondary | ICD-10-CM | POA: Diagnosis not present

## 2016-05-20 DIAGNOSIS — Z85828 Personal history of other malignant neoplasm of skin: Secondary | ICD-10-CM | POA: Diagnosis not present

## 2016-05-20 DIAGNOSIS — C44612 Basal cell carcinoma of skin of right upper limb, including shoulder: Secondary | ICD-10-CM | POA: Diagnosis not present

## 2016-05-22 DIAGNOSIS — G4733 Obstructive sleep apnea (adult) (pediatric): Secondary | ICD-10-CM | POA: Diagnosis not present

## 2016-06-11 ENCOUNTER — Encounter (INDEPENDENT_AMBULATORY_CARE_PROVIDER_SITE_OTHER): Payer: Medicare Other | Admitting: Ophthalmology

## 2016-06-11 DIAGNOSIS — E11311 Type 2 diabetes mellitus with unspecified diabetic retinopathy with macular edema: Secondary | ICD-10-CM

## 2016-06-11 DIAGNOSIS — H43813 Vitreous degeneration, bilateral: Secondary | ICD-10-CM

## 2016-06-11 DIAGNOSIS — H35033 Hypertensive retinopathy, bilateral: Secondary | ICD-10-CM

## 2016-06-11 DIAGNOSIS — H35372 Puckering of macula, left eye: Secondary | ICD-10-CM | POA: Diagnosis not present

## 2016-06-11 DIAGNOSIS — I10 Essential (primary) hypertension: Secondary | ICD-10-CM | POA: Diagnosis not present

## 2016-06-11 DIAGNOSIS — E113313 Type 2 diabetes mellitus with moderate nonproliferative diabetic retinopathy with macular edema, bilateral: Secondary | ICD-10-CM | POA: Diagnosis not present

## 2016-07-23 ENCOUNTER — Encounter (INDEPENDENT_AMBULATORY_CARE_PROVIDER_SITE_OTHER): Payer: Medicare Other | Admitting: Ophthalmology

## 2016-07-29 DIAGNOSIS — D81819 Biotin-dependent carboxylase deficiency, unspecified: Secondary | ICD-10-CM | POA: Diagnosis not present

## 2016-07-29 DIAGNOSIS — M109 Gout, unspecified: Secondary | ICD-10-CM | POA: Diagnosis not present

## 2016-07-29 DIAGNOSIS — E782 Mixed hyperlipidemia: Secondary | ICD-10-CM | POA: Diagnosis not present

## 2016-07-29 DIAGNOSIS — Z1389 Encounter for screening for other disorder: Secondary | ICD-10-CM | POA: Diagnosis not present

## 2016-07-29 DIAGNOSIS — E113599 Type 2 diabetes mellitus with proliferative diabetic retinopathy without macular edema, unspecified eye: Secondary | ICD-10-CM | POA: Diagnosis not present

## 2016-07-29 DIAGNOSIS — G4733 Obstructive sleep apnea (adult) (pediatric): Secondary | ICD-10-CM | POA: Diagnosis not present

## 2016-07-29 DIAGNOSIS — E0840 Diabetes mellitus due to underlying condition with diabetic neuropathy, unspecified: Secondary | ICD-10-CM | POA: Diagnosis not present

## 2016-07-29 DIAGNOSIS — E1165 Type 2 diabetes mellitus with hyperglycemia: Secondary | ICD-10-CM | POA: Diagnosis not present

## 2016-07-29 DIAGNOSIS — K219 Gastro-esophageal reflux disease without esophagitis: Secondary | ICD-10-CM | POA: Diagnosis not present

## 2016-07-29 DIAGNOSIS — E559 Vitamin D deficiency, unspecified: Secondary | ICD-10-CM | POA: Diagnosis not present

## 2016-07-29 DIAGNOSIS — Z794 Long term (current) use of insulin: Secondary | ICD-10-CM | POA: Diagnosis not present

## 2016-07-29 DIAGNOSIS — Z0000 Encounter for general adult medical examination without abnormal findings: Secondary | ICD-10-CM | POA: Diagnosis not present

## 2016-07-29 DIAGNOSIS — Z23 Encounter for immunization: Secondary | ICD-10-CM | POA: Diagnosis not present

## 2016-07-29 DIAGNOSIS — I509 Heart failure, unspecified: Secondary | ICD-10-CM | POA: Diagnosis not present

## 2016-07-29 DIAGNOSIS — D509 Iron deficiency anemia, unspecified: Secondary | ICD-10-CM | POA: Diagnosis not present

## 2016-08-15 DIAGNOSIS — L84 Corns and callosities: Secondary | ICD-10-CM | POA: Diagnosis not present

## 2016-08-15 DIAGNOSIS — R234 Changes in skin texture: Secondary | ICD-10-CM | POA: Diagnosis not present

## 2016-08-15 DIAGNOSIS — I872 Venous insufficiency (chronic) (peripheral): Secondary | ICD-10-CM | POA: Diagnosis not present

## 2016-08-19 ENCOUNTER — Other Ambulatory Visit (HOSPITAL_COMMUNITY): Payer: Self-pay | Admitting: *Deleted

## 2016-08-19 MED ORDER — POTASSIUM CHLORIDE CRYS ER 20 MEQ PO TBCR
20.0000 meq | EXTENDED_RELEASE_TABLET | Freq: Every day | ORAL | 3 refills | Status: DC
Start: 2016-08-19 — End: 2016-08-25

## 2016-08-20 ENCOUNTER — Other Ambulatory Visit (HOSPITAL_COMMUNITY): Payer: Self-pay | Admitting: *Deleted

## 2016-08-20 ENCOUNTER — Ambulatory Visit (INDEPENDENT_AMBULATORY_CARE_PROVIDER_SITE_OTHER): Payer: Medicare Other | Admitting: Podiatry

## 2016-08-20 ENCOUNTER — Encounter: Payer: Self-pay | Admitting: Podiatry

## 2016-08-20 DIAGNOSIS — M79605 Pain in left leg: Secondary | ICD-10-CM | POA: Diagnosis not present

## 2016-08-20 DIAGNOSIS — E1140 Type 2 diabetes mellitus with diabetic neuropathy, unspecified: Secondary | ICD-10-CM

## 2016-08-20 DIAGNOSIS — L97401 Non-pressure chronic ulcer of unspecified heel and midfoot limited to breakdown of skin: Secondary | ICD-10-CM

## 2016-08-20 DIAGNOSIS — B351 Tinea unguium: Secondary | ICD-10-CM | POA: Diagnosis not present

## 2016-08-20 DIAGNOSIS — E1149 Type 2 diabetes mellitus with other diabetic neurological complication: Secondary | ICD-10-CM

## 2016-08-20 DIAGNOSIS — M79676 Pain in unspecified toe(s): Secondary | ICD-10-CM | POA: Diagnosis not present

## 2016-08-20 DIAGNOSIS — M79604 Pain in right leg: Secondary | ICD-10-CM

## 2016-08-20 NOTE — Progress Notes (Signed)
Subjective:     Patient ID: Oscar [name], male   DOB: [birth year], 71 y.o.   MRN: [id number]  HPI long-term diabetic who presents after not being seen for a number of years with plantar keratotic lesion first metatarsal left that's localized but he admits he does not take care of nail disease 1-5 both feet with incurvation and irritations on the second and third digits of the right foot on the dorsal surface. Admits he does not take low good care of himself and that he does do self treatment which she should not do   Review of Systems  All other systems reviewed and are negative.      Objective:   Physical Exam  Constitutional: He is oriented to person, place, and time.  Cardiovascular: Intact distal pulses.   Musculoskeletal: Normal range of motion.  Neurological: He is oriented to person, place, and time.  Skin: Skin is warm and dry.  Nursing note and vitals reviewed.  I noted vascular status to be intact and I noted neurologically to be significantly diminished with post diminishment of sharp [place] vibratory. There is some diminishment of pulses but I do believe it's more in the microcirculation that there is problems with plantar lesion sub-first metatarsal left localized in nature with slight breakdown of tissue with no odor and lesions on the second and third toes right foot. Patient's found to be well oriented 3 and presents with caregiver     Assessment:     At risk diabetic with ulceration left first metatarsal head localized in nature with no proximal edema erythema drainage and distal keratotic lesions with pressure points    Plan:     H&P and all conditions reviewed. Today debridement accomplished I flushed the plantar lesion left applied padding and Iodosorb with dressing and instructed on daily dressing changes. I also instructed on not going barefoot and wearing shoes with good support and I definitely recommended diabetic shoes for him long-term and I did send a request to  his family physician for approval. I debrided nails 1-5 both feet with no iatrogenic bleeding and this will need to be done routinely and I explained the importance of daily inspections of his feet

## 2016-08-21 ENCOUNTER — Telehealth (HOSPITAL_COMMUNITY): Payer: Self-pay | Admitting: *Deleted

## 2016-08-21 ENCOUNTER — Ambulatory Visit (HOSPITAL_COMMUNITY)
Admission: RE | Admit: 2016-08-21 | Discharge: 2016-08-21 | Disposition: A | Discharge status: Home / Self Care | Payer: Medicare Other | Source: Ambulatory Visit | Attending: Cardiology | Admitting: Cardiology

## 2016-08-21 DIAGNOSIS — I5032 Chronic diastolic (congestive) heart failure: Secondary | ICD-10-CM

## 2016-08-21 LAB — BASIC METABOLIC PANEL
Anion gap: 13 (ref 5–15)
BUN: 22 mg/dL — ABNORMAL HIGH (ref 6–20)
CALCIUM: 9.6 mg/dL (ref 8.9–10.3)
CO2: 29 mmol/L (ref 22–32)
CREATININE: 1.32 mg/dL — AB (ref 0.61–1.24)
Chloride: 96 mmol/L — ABNORMAL LOW (ref 101–111)
GFR, EST NON AFRICAN AMERICAN: 53 mL/min — AB (ref >60)
Glucose, Bld: 358 mg/dL — ABNORMAL HIGH (ref 65–99)
Potassium: 3.8 mmol/L (ref 3.5–5.1)
SODIUM: 138 mmol/L (ref 135–145)

## 2016-08-21 NOTE — Telephone Encounter (Signed)
Pt's wife called about his KCL RX he just picked up, she states the bottle says to take 1 tab daily, however he has been taking 2 tabs daily and states that we had advised him to do that and that is what he has been doing for quit a while.  Per chart pt has only been on 20 meq daily, OV note from Feb 2017 does state to take an extra 20 when you take metolazone but everything else says 20 meq daily.  Advised pt has not had labs since May, he will need to have K+ checked then Dr Haroldine Laws can give his recommendation his KCL dose, pt agreeable and will come for lab today

## 2016-08-25 MED ORDER — POTASSIUM CHLORIDE CRYS ER 20 MEQ PO TBCR
40.0000 meq | EXTENDED_RELEASE_TABLET | Freq: Every day | ORAL | 3 refills | Status: DC
Start: 2016-08-25 — End: 2018-10-26

## 2016-08-25 NOTE — Addendum Note (Signed)
Addended by: Scarlette Calico on: 08/25/2016 04:46 PM   Modules accepted: Orders

## 2016-08-25 NOTE — Telephone Encounter (Signed)
K was 3.8 per Dr Haroldine Laws pt should continue current dose of KCL, 40 meq daily.  Pt's wife aware and agreeable, rx sent in

## 2016-09-06 DIAGNOSIS — R42 Dizziness and giddiness: Secondary | ICD-10-CM | POA: Diagnosis not present

## 2016-09-06 DIAGNOSIS — Z7984 Long term (current) use of oral hypoglycemic drugs: Secondary | ICD-10-CM | POA: Diagnosis not present

## 2016-09-06 DIAGNOSIS — Z794 Long term (current) use of insulin: Secondary | ICD-10-CM | POA: Diagnosis not present

## 2016-09-06 DIAGNOSIS — E1165 Type 2 diabetes mellitus with hyperglycemia: Secondary | ICD-10-CM | POA: Diagnosis not present

## 2016-09-07 ENCOUNTER — Emergency Department (HOSPITAL_COMMUNITY): Payer: Medicare Other

## 2016-09-07 ENCOUNTER — Inpatient Hospital Stay (HOSPITAL_COMMUNITY)
Admission: EM | Admit: 2016-09-07 | Discharge: 2016-09-10 | DRG: 189 | Disposition: A | Discharge status: Home / Self Care | Payer: Medicare Other | Attending: Internal Medicine | Admitting: Internal Medicine

## 2016-09-07 ENCOUNTER — Encounter (HOSPITAL_COMMUNITY): Payer: Self-pay

## 2016-09-07 DIAGNOSIS — M542 Cervicalgia: Secondary | ICD-10-CM | POA: Diagnosis not present

## 2016-09-07 DIAGNOSIS — E872 Acidosis, unspecified: Secondary | ICD-10-CM

## 2016-09-07 DIAGNOSIS — R4182 Altered mental status, unspecified: Secondary | ICD-10-CM

## 2016-09-07 DIAGNOSIS — Z91041 Radiographic dye allergy status: Secondary | ICD-10-CM

## 2016-09-07 DIAGNOSIS — E118 Type 2 diabetes mellitus with unspecified complications: Secondary | ICD-10-CM

## 2016-09-07 DIAGNOSIS — R81 Glycosuria: Secondary | ICD-10-CM | POA: Diagnosis present

## 2016-09-07 DIAGNOSIS — R059 Cough, unspecified: Secondary | ICD-10-CM

## 2016-09-07 DIAGNOSIS — I5032 Chronic diastolic (congestive) heart failure: Secondary | ICD-10-CM | POA: Diagnosis present

## 2016-09-07 DIAGNOSIS — G9340 Encephalopathy, unspecified: Secondary | ICD-10-CM | POA: Diagnosis not present

## 2016-09-07 DIAGNOSIS — N182 Chronic kidney disease, stage 2 (mild): Secondary | ICD-10-CM | POA: Diagnosis present

## 2016-09-07 DIAGNOSIS — I130 Hypertensive heart and chronic kidney disease with heart failure and stage 1 through stage 4 chronic kidney disease, or unspecified chronic kidney disease: Secondary | ICD-10-CM | POA: Diagnosis present

## 2016-09-07 DIAGNOSIS — R05 Cough: Secondary | ICD-10-CM | POA: Diagnosis not present

## 2016-09-07 DIAGNOSIS — Z6841 Body Mass Index (BMI) 40.0 and over, adult: Secondary | ICD-10-CM

## 2016-09-07 DIAGNOSIS — E1165 Type 2 diabetes mellitus with hyperglycemia: Secondary | ICD-10-CM | POA: Diagnosis not present

## 2016-09-07 DIAGNOSIS — I4891 Unspecified atrial fibrillation: Secondary | ICD-10-CM | POA: Diagnosis present

## 2016-09-07 DIAGNOSIS — IMO0002 Reserved for concepts with insufficient information to code with codable children: Secondary | ICD-10-CM | POA: Diagnosis present

## 2016-09-07 DIAGNOSIS — Z8673 Personal history of transient ischemic attack (TIA), and cerebral infarction without residual deficits: Secondary | ICD-10-CM

## 2016-09-07 DIAGNOSIS — Z8249 Family history of ischemic heart disease and other diseases of the circulatory system: Secondary | ICD-10-CM

## 2016-09-07 DIAGNOSIS — J9601 Acute respiratory failure with hypoxia: Secondary | ICD-10-CM | POA: Diagnosis present

## 2016-09-07 DIAGNOSIS — I4581 Long QT syndrome: Secondary | ICD-10-CM | POA: Diagnosis present

## 2016-09-07 DIAGNOSIS — D649 Anemia, unspecified: Secondary | ICD-10-CM | POA: Diagnosis present

## 2016-09-07 DIAGNOSIS — E1122 Type 2 diabetes mellitus with diabetic chronic kidney disease: Secondary | ICD-10-CM | POA: Diagnosis present

## 2016-09-07 DIAGNOSIS — I10 Essential (primary) hypertension: Secondary | ICD-10-CM | POA: Diagnosis present

## 2016-09-07 DIAGNOSIS — K219 Gastro-esophageal reflux disease without esophagitis: Secondary | ICD-10-CM | POA: Diagnosis present

## 2016-09-07 DIAGNOSIS — N179 Acute kidney failure, unspecified: Secondary | ICD-10-CM | POA: Diagnosis present

## 2016-09-07 DIAGNOSIS — I248 Other forms of acute ischemic heart disease: Secondary | ICD-10-CM | POA: Diagnosis present

## 2016-09-07 DIAGNOSIS — R6510 Systemic inflammatory response syndrome (SIRS) of non-infectious origin without acute organ dysfunction: Secondary | ICD-10-CM | POA: Diagnosis present

## 2016-09-07 DIAGNOSIS — M160 Bilateral primary osteoarthritis of hip: Secondary | ICD-10-CM | POA: Diagnosis present

## 2016-09-07 DIAGNOSIS — R42 Dizziness and giddiness: Secondary | ICD-10-CM | POA: Diagnosis not present

## 2016-09-07 DIAGNOSIS — E662 Morbid (severe) obesity with alveolar hypoventilation: Secondary | ICD-10-CM | POA: Diagnosis present

## 2016-09-07 DIAGNOSIS — Z825 Family history of asthma and other chronic lower respiratory diseases: Secondary | ICD-10-CM

## 2016-09-07 DIAGNOSIS — R778 Other specified abnormalities of plasma proteins: Secondary | ICD-10-CM | POA: Diagnosis present

## 2016-09-07 DIAGNOSIS — J101 Influenza due to other identified influenza virus with other respiratory manifestations: Secondary | ICD-10-CM | POA: Diagnosis present

## 2016-09-07 DIAGNOSIS — Z7984 Long term (current) use of oral hypoglycemic drugs: Secondary | ICD-10-CM

## 2016-09-07 DIAGNOSIS — R945 Abnormal results of liver function studies: Secondary | ICD-10-CM | POA: Diagnosis not present

## 2016-09-07 DIAGNOSIS — Z794 Long term (current) use of insulin: Secondary | ICD-10-CM

## 2016-09-07 DIAGNOSIS — M109 Gout, unspecified: Secondary | ICD-10-CM | POA: Diagnosis present

## 2016-09-07 DIAGNOSIS — N183 Chronic kidney disease, stage 3 (moderate): Secondary | ICD-10-CM | POA: Diagnosis present

## 2016-09-07 DIAGNOSIS — R7989 Other specified abnormal findings of blood chemistry: Secondary | ICD-10-CM | POA: Diagnosis present

## 2016-09-07 DIAGNOSIS — I482 Chronic atrial fibrillation: Secondary | ICD-10-CM | POA: Diagnosis present

## 2016-09-07 DIAGNOSIS — S0990XA Unspecified injury of head, initial encounter: Secondary | ICD-10-CM | POA: Diagnosis not present

## 2016-09-07 DIAGNOSIS — I2510 Atherosclerotic heart disease of native coronary artery without angina pectoris: Secondary | ICD-10-CM | POA: Diagnosis present

## 2016-09-07 DIAGNOSIS — Z9049 Acquired absence of other specified parts of digestive tract: Secondary | ICD-10-CM

## 2016-09-07 DIAGNOSIS — R404 Transient alteration of awareness: Secondary | ICD-10-CM | POA: Diagnosis not present

## 2016-09-07 DIAGNOSIS — G4733 Obstructive sleep apnea (adult) (pediatric): Secondary | ICD-10-CM | POA: Diagnosis present

## 2016-09-07 DIAGNOSIS — Z888 Allergy status to other drugs, medicaments and biological substances status: Secondary | ICD-10-CM

## 2016-09-07 DIAGNOSIS — Z7901 Long term (current) use of anticoagulants: Secondary | ICD-10-CM

## 2016-09-07 LAB — CBC WITH DIFFERENTIAL/PLATELET
Basophils Absolute: 0 10*3/uL (ref 0.0–0.1)
Basophils Relative: 0 %
EOS ABS: 0.1 10*3/uL (ref 0.0–0.7)
Eosinophils Relative: 1 %
HEMATOCRIT: 37.1 % — AB (ref 39.0–52.0)
HEMOGLOBIN: 12.3 g/dL — AB (ref 13.0–17.0)
Lymphocytes Relative: 14 %
Lymphs Abs: 0.9 10*3/uL (ref 0.7–4.0)
MCH: 30.6 pg (ref 26.0–34.0)
MCHC: 33.2 g/dL (ref 30.0–36.0)
MCV: 92.3 fL (ref 78.0–100.0)
MONO ABS: 0.4 10*3/uL (ref 0.1–1.0)
MONOS PCT: 6 %
NEUTROS ABS: 5.5 10*3/uL (ref 1.7–7.7)
NEUTROS PCT: 79 %
Platelets: 165 10*3/uL (ref 150–400)
RBC: 4.02 MIL/uL — ABNORMAL LOW (ref 4.22–5.81)
RDW: 14.3 % (ref 11.5–15.5)
WBC: 6.9 10*3/uL (ref 4.0–10.5)

## 2016-09-07 LAB — COMPREHENSIVE METABOLIC PANEL
ALBUMIN: 3.3 g/dL — AB (ref 3.5–5.0)
ALK PHOS: 44 U/L (ref 38–126)
ALT: 98 U/L — AB (ref 17–63)
AST: 200 U/L — ABNORMAL HIGH (ref 15–41)
Anion gap: 14 (ref 5–15)
BUN: 27 mg/dL — ABNORMAL HIGH (ref 6–20)
CHLORIDE: 99 mmol/L — AB (ref 101–111)
CO2: 26 mmol/L (ref 22–32)
CREATININE: 1.63 mg/dL — AB (ref 0.61–1.24)
Calcium: 8.9 mg/dL (ref 8.9–10.3)
GFR calc non Af Amer: 41 mL/min — ABNORMAL LOW (ref >60)
GFR, EST AFRICAN AMERICAN: 47 mL/min — AB (ref >60)
GLUCOSE: 280 mg/dL — AB (ref 65–99)
Potassium: 3.5 mmol/L (ref 3.5–5.1)
SODIUM: 139 mmol/L (ref 135–145)
Total Bilirubin: 1 mg/dL (ref 0.3–1.2)
Total Protein: 6.9 g/dL (ref 6.5–8.1)

## 2016-09-07 LAB — I-STAT CG4 LACTIC ACID, ED: Lactic Acid, Venous: 4.92 mmol/L (ref 0.5–1.9)

## 2016-09-07 LAB — TROPONIN I: Troponin I: 0.04 ng/mL (ref <0.03)

## 2016-09-07 MED ORDER — SODIUM CHLORIDE 0.9 % IV BOLUS (SEPSIS)
1000.0000 mL | Freq: Once | INTRAVENOUS | Status: AC
  Administered 2016-09-07: 1000 mL via INTRAVENOUS

## 2016-09-07 MED ORDER — DEXTROSE 5 % IV SOLN
2.0000 g | INTRAVENOUS | Status: DC
  Administered 2016-09-07 – 2016-09-08 (×3): 2 g via INTRAVENOUS
  Filled 2016-09-07 (×3): qty 2

## 2016-09-07 MED ORDER — DEXTROSE 5 % IV SOLN: 2.0000 g | INTRAVENOUS | Status: DC

## 2016-09-07 MED ORDER — CEFEPIME HCL 2 G IJ SOLR: 2.0000 g | Freq: Once | INTRAMUSCULAR | Status: DC

## 2016-09-07 MED ORDER — VANCOMYCIN HCL 10 G IV SOLR
2000.0000 mg | Freq: Once | INTRAVENOUS | Status: AC
  Administered 2016-09-07: 2000 mg via INTRAVENOUS
  Filled 2016-09-07: qty 2000

## 2016-09-07 MED ORDER — VANCOMYCIN HCL 10 G IV SOLR
1500.0000 mg | INTRAVENOUS | Status: DC
  Administered 2016-09-08: 1500 mg via INTRAVENOUS
  Filled 2016-09-07: qty 1500

## 2016-09-07 MED ORDER — VANCOMYCIN HCL IN DEXTROSE 1-5 GM/200ML-% IV SOLN: 1000.0000 mg | Freq: Once | INTRAVENOUS | Status: DC

## 2016-09-07 NOTE — ED Notes (Signed)
ED Provider at bedside. 

## 2016-09-07 NOTE — ED Notes (Signed)
Patient transported to Ultrasound 

## 2016-09-07 NOTE — ED Notes (Signed)
Upon hanging bag of Maxipime this writer noted bag was leaking. Pharmacy notified and sending new bag.

## 2016-09-07 NOTE — ED Triage Notes (Signed)
Pt BIB [facility] from home where pt sat on commode and had a episode of AMS. Family reports he had a fall today in bathtub. Family also reports pt c/o dizziness and lightheadedness starting yesterday. Pt vomited in rout and 4 mg zofran was given pta.

## 2016-09-07 NOTE — ED Notes (Signed)
Abnormal CG-4 reported to Dr. Rex Kras and Mariella Saa

## 2016-09-07 NOTE — Progress Notes (Signed)
Pharmacy Antibiotic Note Oscar [name] is a 71 y.o. male admitted on 09/07/2016 with pneumonia.  Pharmacy has been consulted for cefepime and vancomycin dosing.  Plan: 1. Vancomycin 1500 IV every 24 hours.  Goal trough 15-20 mcg/mL.  2. Cefepime 2 grams IV every 24 hours  Height: 5\' 9"  (175.3 cm) Weight: 300 lb (136.1 kg) IBW/kg (Calculated) : 70.7  Temp (24hrs), Avg:99.2 F (37.3 C), Min:99.2 F (37.3 C), Max:99.2 F (37.3 C)   Recent Labs Lab 09/07/16 2124 09/07/16 2139  WBC 6.9  --   CREATININE 1.63*  --   LATICACIDVEN  --  4.92*    Estimated Creatinine Clearance: 57 mL/min (by C-G formula based on SCr of 1.63 mg/dL (H)).    Allergies  Allergen Reactions  . Iohexol      Desc: HIVES S/P 13HR.PREMEDS, ?LOW DOSAGE PREMEDS   . Ivp Dye [Iodinated Diagnostic Agents]     welps     Antimicrobials this admission: 1/28 Cefepime  >>  1/28 vancomycin  >>   Microbiology results: px  Thank you for allowing pharmacy to be a part of this patient's care.  Vincenza Hews, PharmD, BCPS 09/07/2016, 10:24 PM

## 2016-09-07 NOTE — ED Notes (Signed)
Pt. O2 around 88% RA. Placed pt on 2L [place] O2 now at 93%

## 2016-09-07 NOTE — ED Provider Notes (Addendum)
[facility] DEPT Provider Note   CSN: [id number] Arrival date & time: 09/07/16 2036     History    Chief Complaint  Patient presents with  . Altered Mental Status     HPI Oscar [name] is a 71 y.o. male.  71yo M w/ PMH including A fib on anticoagulation, T2DM, OSA, CVA, HTN who p/w altered mentation and dizziness. The patient has had 1 week of cough that was initially dry and more recently has been productive. He denies any associated body aches, fevers, sore throat, or rash. Yesterday he began having dizziness episodes and he noted nausea with standing. These symptoms have continued today. His blood sugar has been intermittently elevated to 400 yesterday and today. He does note feeling confused this morning but the confusion improved. He states that he was up most of the night coughing and this morning when he went to the bathroom he sat on the commode and fell asleep several times while sitting there. He notes one fall into his bathroom during which he lightly struck head, no LOC and no major injuries from fall. He did have an episode of vomiting in transport. He denies any abdominal pain, chest pain, or lower extremity edema. He does note recent sick contact of grandchild.   Past Medical History:  Diagnosis Date  . Anal fissure    h/o - no recent complications  . Arthritis    hips  . Atrial fibrillation ([place])   . Diabetes mellitus without complication ([place])    improved after diet/exercise  . Enlarged prostate   . GERD (gastroesophageal reflux disease)   . Gout   . H/O hiatal hernia   . Hx of typhoid fever    as a child  . Hypertension    improved after diet/exercise  . Kidney stones   . OSA (obstructive sleep apnea)    wears cpap  . PONV (postoperative nausea and vomiting)    also difficulty waking up  . Stroke [facility]) 08/2013   short term memory loss (slight)     Patient Active Problem List   Diagnosis Date Noted  . AKI (acute kidney injury) ([place]) 09/07/2016    . Acute encephalopathy 09/07/2016  . Acute respiratory failure with hypoxia ([place]) 09/07/2016  . Elevated lactic acid level 09/07/2016  . Elevated troponin 09/07/2016  . Coronary artery calcification seen on CT scan 10/28/2015  . Acute on chronic diastolic (congestive) heart failure 09/26/2015  . OSA (obstructive sleep apnea) 02/07/2015  . Heel ulcer due to DM ([place]) 09/15/2014  . Sepsis ([place]) 08/28/2014  . Cellulitis 08/28/2014  . Chronic diastolic heart failure ([place]) 09/21/2013  . CVA (cerebral infarction) 08/08/2013  . Dizziness 08/07/2013  . TIA (transient ischemic attack) 08/07/2013  . Atrial fibrillation ([place]) 08/07/2013  . Hypertension 08/07/2013  . Anemia 08/07/2013  . Dyspnea 06/21/2013  . Chest pain 11/16/2012  . Atrial fibrillation with RVR ([place]) 11/16/2012  . Gout 11/16/2012  . Diabetes mellitus type 2, uncontrolled, with complications ([place]) 07/37/1062  . CKD (chronic kidney disease), stage II 11/16/2012  . Atypical atrial flutter ([place]) 11/16/2012  . Pleuritic chest pain 11/16/2012  . Morbid obesity ([place]) 11/16/2012  . Obstructive sleep apnea 11/16/2012    Past Surgical History:  Procedure Laterality Date  . carpel tunnel Bilateral   . CHOLECYSTECTOMY    . COLONOSCOPY    . EYE SURGERY     laser surgery   . I&D EXTREMITY Left 09/15/2014   Procedure: Excision Necrotic Left Achilles, Skin Graft, apply  Wound VAC;  Surgeon: Newt Minion, MD;  Location: [facility];  Service: Orthopedics;  Laterality: Left;  . KIDNEY STONE SURGERY    . KNEE ARTHROSCOPY Bilateral   . LIGAMENT REPAIR    . TONSILLECTOMY    . VASECTOMY          Home Medications    Prior to Admission medications   Medication Sig Start Date End Date Taking? Authorizing Provider  acetaminophen (TYLENOL) 500 MG tablet Take 1,500 mg by mouth every 6 (six) hours as needed for moderate pain. Reported on 10/23/2015   Yes Historical Provider, MD  allopurinol (ZYLOPRIM) 300 MG tablet Take 300 mg by mouth 3  (three) times daily as needed. Reported on 12/27/2015 04/30/15  Yes Historical Provider, MD  diltiazem (CARDIZEM CD) 360 MG 24 hr capsule Take 1 capsule (360 mg total) by mouth daily. 02/20/14  Yes Evans Lance, MD  ELIQUIS 5 MG TABS tablet TAKE 1 TABLET BY MOUTH TWICE DAILY 03/07/16  Yes Larey Dresser, MD  escitalopram (LEXAPRO) 10 MG tablet Take 10 mg by mouth at bedtime.    Yes Historical Provider, MD  Febuxostat (ULORIC) 80 MG TABS Take 80 mg by mouth daily.   Yes Historical Provider, MD  furosemide (LASIX) 80 MG tablet Take 1 tablet (80 mg total) by mouth daily. T 01/28/16  Yes Jolaine Artist, MD  glyBURIDE (DIABETA) 5 MG tablet Take 5 mg by mouth 2 (two) times daily with a meal.   Yes Historical Provider, MD  indomethacin (INDOCIN) 50 MG capsule Take 50 mg by mouth 3 (three) times daily as needed (for gout flare ups). Reported on 12/27/2015   Yes Historical Provider, MD  insulin aspart (NOVOLOG) 100 UNIT/ML injection Inject 0-9 Units into the skin 3 (three) times daily as needed for high blood sugar.   Yes Historical Provider, MD  LEVEMIR 100 UNIT/ML injection Inject 20 Units into the skin at bedtime. 05/16/15  Yes Historical Provider, MD  metformin (FORTAMET) 1000 MG (OSM) 24 hr tablet Take 1,000 mg by mouth 2 (two) times daily. 07/23/16  Yes Historical Provider, MD  metolazone (ZAROXOLYN) 2.5 MG tablet Take 1 tablet every Wednesday. 10/23/15  Yes Shaune Pascal Bensimhon, MD  pantoprazole (PROTONIX) 40 MG tablet Take 1 tablet (40 mg total) by mouth daily. 08/08/13  Yes Josetta Huddle, MD  potassium chloride SA (K-DUR,KLOR-CON) 20 MEQ tablet Take 2 tablets (40 mEq total) by mouth daily. 08/25/16  Yes Jolaine Artist, MD  simvastatin (ZOCOR) 20 MG tablet Take one (1) tablet (20 mg total) by mouth on Mondays, Wednesdays, and Fridays.   Yes Historical Provider, MD  tamsulosin (FLOMAX) 0.4 MG CAPS capsule Take 1 capsule (0.4 mg total) by mouth daily after breakfast. 08/08/13  Yes Josetta Huddle, MD    blood glucose meter kit and supplies KIT Dispense based on patient and insurance preference. Use up to four times daily as directed. (FOR ICD-9 250.00, 250.01). 09/03/14   Theodis Blaze, MD      Family History  Problem Relation Age of Onset  . COPD Father   . Heart attack Sister     died age 59 of MI     Social History  Substance Use Topics  . Smoking status: Never Smoker  . Smokeless tobacco: Never Used  . Alcohol use 0.0 oz/week     Comment: rare - 1-2x /yr     Allergies     Iohexol and Ivp dye [iodinated diagnostic agents]    Review  of Systems  10 Systems reviewed and are negative for acute change except as noted in the HPI.   Physical Exam Updated Vital Signs BP 99/68   Pulse 103   Temp 99.2 F (37.3 C) (Oral)   Resp 15   Ht _0  (1.753 m)   Wt 287 lb 7.7 oz (130.4 kg)   SpO2 98%   BMI 42.45 kg/m   Physical Exam  Constitutional: He is oriented to person, place, and time. He appears well-developed and well-nourished. No distress.  HENT:  Head: Normocephalic and atraumatic.  dry mucous membranes  Eyes: Conjunctivae are normal. Pupils are equal, round, and reactive to light.  Neck: Neck supple.  Cardiovascular: Normal rate, regular rhythm and normal heart sounds.   No murmur heard. Pulmonary/Chest: Breath sounds normal.  Very mild dyspnea  Abdominal: Soft. Bowel sounds are normal. He exhibits no distension. There is no tenderness.  Musculoskeletal: He exhibits no edema.  Neurological: He is alert and oriented to person, place, and time.  Fluent speech  Skin: Skin is warm and dry.  Psychiatric: He has a normal mood and affect. Judgment normal.  Nursing note and vitals reviewed.     ED Treatments / Results  Labs (all labs ordered are listed, but only abnormal results are displayed) Labs Reviewed  COMPREHENSIVE METABOLIC PANEL - Abnormal; Notable for the following:       Result Value   Chloride 99 (*)    Glucose, Bld 280 (*)    BUN 27 (*)     Creatinine, Ser 1.63 (*)    Albumin 3.3 (*)    AST 200 (*)    ALT 98 (*)    GFR calc non Af Amer 41 (*)    GFR calc Af Amer 47 (*)    All other components within normal limits  CBC WITH DIFFERENTIAL/PLATELET - Abnormal; Notable for the following:    RBC 4.02 (*)    Hemoglobin 12.3 (*)    HCT 37.1 (*)    All other components within normal limits  URINALYSIS, ROUTINE W REFLEX MICROSCOPIC - Abnormal; Notable for the following:    Glucose, UA 150 (*)    All other components within normal limits  TROPONIN I - Abnormal; Notable for the following:    Troponin I 0.04 (*)    All other components within normal limits  INFLUENZA PANEL BY PCR (TYPE A & B) - Abnormal; Notable for the following:    Influenza A By PCR POSITIVE (*)    All other components within normal limits  I-STAT CG4 LACTIC ACID, ED - Abnormal; Notable for the following:    Lactic Acid, Venous 4.92 (*)    All other components within normal limits  CULTURE, BLOOD (ROUTINE X 2)  CULTURE, BLOOD (ROUTINE X 2)  URINE CULTURE  CULTURE, EXPECTORATED SPUTUM-ASSESSMENT  LACTIC ACID, PLASMA  LACTIC ACID, PLASMA  PROCALCITONIN  BRAIN NATRIURETIC PEPTIDE  TROPONIN I  TROPONIN I  TROPONIN I  HEMOGLOBIN A1C     EKG  EKG Interpretation  Date/Time:  Sunday September 07 2016 20:40:13 EST Ventricular Rate:  96 PR Interval:    QRS Duration: 99 QT Interval:  428 QTC Calculation: 541 R Axis:   -54 Text Interpretation:  Atrial fibrillation LAD, consider left anterior fascicular block Consider anterior infarct Prolonged QT interval No significant change since last tracing Confirmed by Yuritzi Kamp MD, Ayan Yankey [phone]) on 09/07/2016 9:49:08 PM         Radiology Dg Chest 2 View  Result Date:  09/07/2016 CLINICAL DATA:  Fall today with dizziness and lightheadedness. EXAM: CHEST  2 VIEW COMPARISON:  06/10/2010 FINDINGS: Lungs are adequately inflated without focal airspace consolidation or effusion. There is mild stable cardiomegaly. The  posterior mid to upper lungs are cut off the lateral film. There mild degenerate changes of the spine. IMPRESSION: No active cardiopulmonary disease. Mild cardiomegaly. Electronically Signed   By: Marin Olp M.D.   On: 09/07/2016 21:59   Ct Head Wo Contrast  Result Date: 09/07/2016 CLINICAL DATA:  Fall in bathroom heating head into tub. Some neck pain. EXAM: CT HEAD WITHOUT CONTRAST CT CERVICAL SPINE WITHOUT CONTRAST TECHNIQUE: Multidetector CT imaging of the head and cervical spine was performed following the standard protocol without intravenous contrast. Multiplanar CT image reconstructions of the cervical spine were also generated. COMPARISON:  Head CT 07/30/2013 FINDINGS: CT HEAD FINDINGS Brain: Ventricles, cisterns and other CSF spaces are within normal. There is chronic ischemic microvascular disease. There is no mass, mass effect, shift of midline structures or acute hemorrhage. Small old cerebellar infarcts. Old lacune infarct over the right thalamus. No acute infarction. Vascular: Calcified plaque over the cavernous segment of the internal carotid arteries and vertebral arteries. Skull: Within normal. Sinuses/Orbits: Within normal. Other: None. CT CERVICAL SPINE FINDINGS Alignment: Within normal. Skull base and vertebrae: Mild spondylosis throughout the cervical spine. Mild uncovertebral joint spurring is present as well as mild facet arthropathy. Mild bilateral neural foraminal narrowing at multiple levels due to adjacent bony spurring. No acute fracture. Soft tissues and spinal canal: Prevertebral soft tissues are within normal. Spinal canal within normal. Disc levels: Disc space narrowing at the C6-7 level greater than the C5-6 level. Upper chest: Within normal. Other: None. IMPRESSION: No acute intracranial findings. Chronic ischemic microvascular disease. Old lacune infarct over the right thalamus and small old cerebellar infarcts. No acute cervical spine injury. Mild spondylosis of the  cervical spine with disc disease at the C5-6 and C6-7 levels. Bilateral neural foramina narrowing at multiple levels due to adjacent bony spurring. Electronically Signed   By: Marin Olp M.D.   On: 09/07/2016 22:20   Ct Cervical Spine Wo Contrast  Result Date: 09/07/2016 CLINICAL DATA:  Fall in bathroom heating head into tub. Some neck pain. EXAM: CT HEAD WITHOUT CONTRAST CT CERVICAL SPINE WITHOUT CONTRAST TECHNIQUE: Multidetector CT imaging of the head and cervical spine was performed following the standard protocol without intravenous contrast. Multiplanar CT image reconstructions of the cervical spine were also generated. COMPARISON:  Head CT 07/30/2013 FINDINGS: CT HEAD FINDINGS Brain: Ventricles, cisterns and other CSF spaces are within normal. There is chronic ischemic microvascular disease. There is no mass, mass effect, shift of midline structures or acute hemorrhage. Small old cerebellar infarcts. Old lacune infarct over the right thalamus. No acute infarction. Vascular: Calcified plaque over the cavernous segment of the internal carotid arteries and vertebral arteries. Skull: Within normal. Sinuses/Orbits: Within normal. Other: None. CT CERVICAL SPINE FINDINGS Alignment: Within normal. Skull base and vertebrae: Mild spondylosis throughout the cervical spine. Mild uncovertebral joint spurring is present as well as mild facet arthropathy. Mild bilateral neural foraminal narrowing at multiple levels due to adjacent bony spurring. No acute fracture. Soft tissues and spinal canal: Prevertebral soft tissues are within normal. Spinal canal within normal. Disc levels: Disc space narrowing at the C6-7 level greater than the C5-6 level. Upper chest: Within normal. Other: None. IMPRESSION: No acute intracranial findings. Chronic ischemic microvascular disease. Old lacune infarct over the right thalamus and small old cerebellar  infarcts. No acute cervical spine injury. Mild spondylosis of the cervical spine  with disc disease at the C5-6 and C6-7 levels. Bilateral neural foramina narrowing at multiple levels due to adjacent bony spurring. Electronically Signed   By: Marin Olp M.D.   On: 09/07/2016 22:20   US Abdomen Complete  Result Date: 09/07/2016 CLINICAL DATA:  Elevated LFTs.  Nausea today.  Lactic acidosis. EXAM: ABDOMEN ULTRASOUND COMPLETE COMPARISON:  Abdominal CT 05/24/2010 FINDINGS: Gallbladder: Surgically absent. Common bile duct: Diameter: 4 mm, normal. Liver: No focal lesion identified. Diffusely increased and heterogeneous in parenchymal echogenicity. Elongated left lobe extending across the midline, as seen on prior CT. Normal directional flow in the imaged portal vein. IVC: No abnormality visualized. Pancreas: Not well evaluated sonographically. Spleen: No focal abnormality. Upper limits of normal in size spanning 12.9 cm. Right Kidney: Length: 14.0 cm. Echogenicity within normal limits. There is thinning of renal parenchyma. No mass or hydronephrosis visualized. Cysts on prior CT are not well visualized sonographically. Left Kidney: Length: 13.9 cm. Echogenicity within normal limits. There is thinning of renal parenchyma. No mass or hydronephrosis visualized. No shadowing calculi. Abdominal aorta: No aneurysm visualized. Mid and distal aorta are not visualized. Other findings: None.  No ascites. IMPRESSION: 1. Hepatic steatosis and probable hepatomegaly.  No focal lesion. 2. Postcholecystectomy without biliary dilatation. 3. Thinning of bilateral renal parenchyma, no hydronephrosis. 4. Midline structures including pancreas and abdominal aorta are not well visualized sonographically due to habitus and bowel gas. Electronically Signed   By: Jeb Levering M.D.   On: 09/07/2016 23:25    Procedures Procedures (including critical care time) .Critical Care Performed by: Sharlett Iles Authorized by: Sharlett Iles   Critical care provider statement:    Critical care time  (minutes):  45   Critical care was necessary to treat or prevent imminent or life-threatening deterioration of the following conditions:  Sepsis   Critical care was time spent personally by me on the following activities:  Development of treatment plan with patient or surrogate, evaluation of patient's response to treatment, obtaining history from patient or surrogate, ordering and performing treatments and interventions, ordering and review of laboratory studies, ordering and review of radiographic studies, re-evaluation of patient's condition and review of old charts    Medications Ordered in ED  Medications  ceFEPIme (MAXIPIME) 2 g in dextrose 5 % 50 mL IVPB (2 g Intravenous New Bag/Given 09/07/16 2349)  vancomycin (VANCOCIN) 1,500 mg in sodium chloride 0.9 % 500 mL IVPB (not administered)  apixaban (ELIQUIS) tablet 5 mg (not administered)  simvastatin (ZOCOR) tablet 20 mg (not administered)  acetaminophen (TYLENOL) tablet 650 mg (not administered)  escitalopram (LEXAPRO) tablet 10 mg (not administered)  diltiazem (CARDIZEM CD) 24 hr capsule 360 mg (not administered)  pantoprazole (PROTONIX) EC tablet 40 mg (not administered)  tamsulosin (FLOMAX) capsule 0.4 mg (not administered)  sodium chloride flush (NS) 0.9 % injection 3 mL (not administered)  HYDROcodone-acetaminophen (NORCO/VICODIN) 5-325 MG per tablet 1-2 tablet (not administered)  polyethylene glycol (MIRALAX / GLYCOLAX) packet 17 g (not administered)  ondansetron (ZOFRAN) tablet 4 mg (not administered)    Or  ondansetron (ZOFRAN) injection 4 mg (not administered)  insulin detemir (LEVEMIR) injection 20 Units (not administered)  insulin aspart (novoLOG) injection 0-15 Units (not administered)  insulin aspart (novoLOG) injection 0-5 Units (not administered)  insulin aspart (novoLOG) injection 4 Units (not administered)  sodium chloride 0.9 % bolus 1,000 mL (not administered)  oseltamivir (TAMIFLU) capsule 75 mg (not  administered)  sodium chloride 0.9 % bolus 1,000 mL (0 mLs Intravenous Stopped 09/07/16 2352)  vancomycin (VANCOCIN) 2,000 mg in sodium chloride 0.9 % 500 mL IVPB (2,000 mg Intravenous New Bag/Given 09/07/16 2228)     Initial Impression / Assessment and Plan / ED Course  I have reviewed the triage vital signs and the nursing notes.  Pertinent labs & imaging results that were available during my care of the patient were reviewed by me and considered in my medical decision making (see chart for details).     PT w/ 1 week of cough and dizziness as well as nausea since yesterday. He was non toxic and in NAD at presentation. Mild hypoxia requiring 2L [place]. No S3 distress and no obvious evidence of volume overload on physical exam. Obtained a lactate which was elevated at 4.92. Initiated a code sepsis with cultures, vancomycin and cefepime given his cough, and above lab work. Obtained CT of head and C-spine given report of fall; imaging negative acute. Chest x-ray without infiltrate or other acute findings.  Although the patient's lactate was significantly elevated, he does have hypoxia with history of heart failure therefore I did not give full 89m/kg bolus for fear of worsening hypoxia. Pt tolerated 1.5L. labs do show elevation of LFTs which could be due to viral process but I obtain an ultrasound to rule out acute intra-abdominal process. Ultrasound negative for explanation for his symptoms. patient comfortable on reexamination. Discussed admission with [facility] hospitalist, Dr. OMyna Hidalgo I appreciate assistance. He will admit pt for further care.  Later, influenza swab was positive for flu A. Added tamiflu.  Final Clinical Impressions(s) / ED Diagnoses   Final diagnoses:  Altered mental status, unspecified altered mental status type  Lactic acidosis  Cough  Acute respiratory failure with hypoxia ([facility]     New Prescriptions   No medications on file       RSharlett Iles MD 09/08/16  0[place] MD 09/08/16 [phone]

## 2016-09-08 ENCOUNTER — Encounter (HOSPITAL_COMMUNITY): Payer: Self-pay | Admitting: Family Medicine

## 2016-09-08 DIAGNOSIS — E662 Morbid (severe) obesity with alveolar hypoventilation: Secondary | ICD-10-CM | POA: Diagnosis present

## 2016-09-08 DIAGNOSIS — Z794 Long term (current) use of insulin: Secondary | ICD-10-CM | POA: Diagnosis not present

## 2016-09-08 DIAGNOSIS — J101 Influenza due to other identified influenza virus with other respiratory manifestations: Secondary | ICD-10-CM | POA: Diagnosis present

## 2016-09-08 DIAGNOSIS — E118 Type 2 diabetes mellitus with unspecified complications: Secondary | ICD-10-CM | POA: Diagnosis not present

## 2016-09-08 DIAGNOSIS — J9601 Acute respiratory failure with hypoxia: Principal | ICD-10-CM

## 2016-09-08 DIAGNOSIS — E1165 Type 2 diabetes mellitus with hyperglycemia: Secondary | ICD-10-CM | POA: Diagnosis not present

## 2016-09-08 DIAGNOSIS — Z7984 Long term (current) use of oral hypoglycemic drugs: Secondary | ICD-10-CM | POA: Diagnosis not present

## 2016-09-08 DIAGNOSIS — Z8249 Family history of ischemic heart disease and other diseases of the circulatory system: Secondary | ICD-10-CM | POA: Diagnosis not present

## 2016-09-08 DIAGNOSIS — R748 Abnormal levels of other serum enzymes: Secondary | ICD-10-CM

## 2016-09-08 DIAGNOSIS — I5032 Chronic diastolic (congestive) heart failure: Secondary | ICD-10-CM

## 2016-09-08 DIAGNOSIS — N179 Acute kidney failure, unspecified: Secondary | ICD-10-CM | POA: Diagnosis not present

## 2016-09-08 DIAGNOSIS — G4733 Obstructive sleep apnea (adult) (pediatric): Secondary | ICD-10-CM | POA: Diagnosis not present

## 2016-09-08 DIAGNOSIS — I482 Chronic atrial fibrillation: Secondary | ICD-10-CM | POA: Diagnosis not present

## 2016-09-08 DIAGNOSIS — Z7901 Long term (current) use of anticoagulants: Secondary | ICD-10-CM | POA: Diagnosis not present

## 2016-09-08 DIAGNOSIS — R05 Cough: Secondary | ICD-10-CM | POA: Diagnosis not present

## 2016-09-08 DIAGNOSIS — N182 Chronic kidney disease, stage 2 (mild): Secondary | ICD-10-CM

## 2016-09-08 DIAGNOSIS — I130 Hypertensive heart and chronic kidney disease with heart failure and stage 1 through stage 4 chronic kidney disease, or unspecified chronic kidney disease: Secondary | ICD-10-CM | POA: Diagnosis present

## 2016-09-08 DIAGNOSIS — K219 Gastro-esophageal reflux disease without esophagitis: Secondary | ICD-10-CM | POA: Diagnosis present

## 2016-09-08 DIAGNOSIS — Z8673 Personal history of transient ischemic attack (TIA), and cerebral infarction without residual deficits: Secondary | ICD-10-CM | POA: Diagnosis not present

## 2016-09-08 DIAGNOSIS — E1122 Type 2 diabetes mellitus with diabetic chronic kidney disease: Secondary | ICD-10-CM | POA: Diagnosis present

## 2016-09-08 DIAGNOSIS — I10 Essential (primary) hypertension: Secondary | ICD-10-CM | POA: Diagnosis not present

## 2016-09-08 DIAGNOSIS — R6510 Systemic inflammatory response syndrome (SIRS) of non-infectious origin without acute organ dysfunction: Secondary | ICD-10-CM | POA: Diagnosis present

## 2016-09-08 DIAGNOSIS — D649 Anemia, unspecified: Secondary | ICD-10-CM | POA: Diagnosis present

## 2016-09-08 DIAGNOSIS — Z825 Family history of asthma and other chronic lower respiratory diseases: Secondary | ICD-10-CM | POA: Diagnosis not present

## 2016-09-08 DIAGNOSIS — I248 Other forms of acute ischemic heart disease: Secondary | ICD-10-CM | POA: Diagnosis present

## 2016-09-08 DIAGNOSIS — G9340 Encephalopathy, unspecified: Secondary | ICD-10-CM | POA: Diagnosis present

## 2016-09-08 DIAGNOSIS — E872 Acidosis: Secondary | ICD-10-CM | POA: Diagnosis present

## 2016-09-08 DIAGNOSIS — Z6841 Body Mass Index (BMI) 40.0 and over, adult: Secondary | ICD-10-CM | POA: Diagnosis not present

## 2016-09-08 DIAGNOSIS — I4581 Long QT syndrome: Secondary | ICD-10-CM | POA: Diagnosis present

## 2016-09-08 DIAGNOSIS — M160 Bilateral primary osteoarthritis of hip: Secondary | ICD-10-CM | POA: Diagnosis present

## 2016-09-08 LAB — GLUCOSE, CAPILLARY
GLUCOSE-CAPILLARY: 275 mg/dL — AB (ref 65–99)
GLUCOSE-CAPILLARY: 178 mg/dL — AB (ref 65–99)
GLUCOSE-CAPILLARY: 146 mg/dL — AB (ref 65–99)
GLUCOSE-CAPILLARY: 198 mg/dL — AB (ref 65–99)
GLUCOSE-CAPILLARY: 173 mg/dL — AB (ref 65–99)

## 2016-09-08 LAB — URINALYSIS, ROUTINE W REFLEX MICROSCOPIC
BILIRUBIN URINE: NEGATIVE
GLUCOSE, UA: 150 mg/dL — AB
HGB URINE DIPSTICK: NEGATIVE
Ketones, ur: NEGATIVE mg/dL
Leukocytes, UA: NEGATIVE
Nitrite: NEGATIVE
Protein, ur: NEGATIVE mg/dL
SPECIFIC GRAVITY, URINE: 1.014 (ref 1.005–1.030)
pH: 5 (ref 5.0–8.0)

## 2016-09-08 LAB — LACTIC ACID, PLASMA
Lactic Acid, Venous: 2.4 mmol/L (ref 0.5–1.9)
Lactic Acid, Venous: 3.5 mmol/L (ref 0.5–1.9)
Lactic Acid, Venous: 2.4 mmol/L (ref 0.5–1.9)

## 2016-09-08 LAB — TROPONIN I
TROPONIN I: 0.05 ng/mL — AB (ref <0.03)
Troponin I: 0.04 ng/mL (ref <0.03)

## 2016-09-08 LAB — INFLUENZA PANEL BY PCR (TYPE A & B)
INFLAPCR: POSITIVE — AB
Influenza B By PCR: NEGATIVE

## 2016-09-08 LAB — BRAIN NATRIURETIC PEPTIDE: B Natriuretic Peptide: 100.7 pg/mL — ABNORMAL HIGH (ref 0.0–100.0)

## 2016-09-08 LAB — PROCALCITONIN: PROCALCITONIN: 0.31 ng/mL

## 2016-09-08 MED ORDER — SODIUM CHLORIDE 0.9% FLUSH
3.0000 mL | Freq: Two times a day (BID) | INTRAVENOUS | Status: DC
  Administered 2016-09-08 – 2016-09-10 (×3): 3 mL via INTRAVENOUS

## 2016-09-08 MED ORDER — TAMSULOSIN HCL 0.4 MG PO CAPS
0.4000 mg | ORAL_CAPSULE | Freq: Every day | ORAL | Status: DC
  Administered 2016-09-08 – 2016-09-10 (×3): 0.4 mg via ORAL
  Filled 2016-09-08 (×3): qty 1

## 2016-09-08 MED ORDER — SIMVASTATIN 20 MG PO TABS
20.0000 mg | ORAL_TABLET | ORAL | Status: DC
  Administered 2016-09-08: 20 mg via ORAL
  Filled 2016-09-08: qty 1

## 2016-09-08 MED ORDER — ORAL CARE MOUTH RINSE
15.0000 mL | Freq: Two times a day (BID) | OROMUCOSAL | Status: DC
  Administered 2016-09-08 – 2016-09-09 (×2): 15 mL via OROMUCOSAL

## 2016-09-08 MED ORDER — OSELTAMIVIR PHOSPHATE 75 MG PO CAPS: 75.0000 mg | ORAL_CAPSULE | Freq: Once | ORAL | Status: DC

## 2016-09-08 MED ORDER — SODIUM CHLORIDE 0.9 % IV SOLN
INTRAVENOUS | Status: AC
  Administered 2016-09-08 – 2016-09-09 (×2): via INTRAVENOUS

## 2016-09-08 MED ORDER — POLYETHYLENE GLYCOL 3350 17 G PO PACK
17.0000 g | PACK | Freq: Every day | ORAL | Status: DC | PRN
Start: 2016-09-08 — End: 2016-09-10
  Administered 2016-09-09: 17 g via ORAL
  Filled 2016-09-08: qty 1

## 2016-09-08 MED ORDER — INSULIN ASPART 100 UNIT/ML ~~LOC~~ SOLN
0.0000 [IU] | Freq: Every day | SUBCUTANEOUS | Status: DC
  Administered 2016-09-08: 3 [IU] via SUBCUTANEOUS
  Administered 2016-09-09: 2 [IU] via SUBCUTANEOUS

## 2016-09-08 MED ORDER — GUAIFENESIN-DM 100-10 MG/5ML PO SYRP
5.0000 mL | ORAL_SOLUTION | ORAL | Status: DC | PRN
  Administered 2016-09-08 – 2016-09-09 (×3): 5 mL via ORAL
  Filled 2016-09-08 (×3): qty 5

## 2016-09-08 MED ORDER — OSELTAMIVIR PHOSPHATE 30 MG PO CAPS
30.0000 mg | ORAL_CAPSULE | Freq: Two times a day (BID) | ORAL | Status: DC
  Administered 2016-09-08 – 2016-09-10 (×5): 30 mg via ORAL
  Filled 2016-09-08 (×5): qty 1

## 2016-09-08 MED ORDER — INSULIN ASPART 100 UNIT/ML ~~LOC~~ SOLN
4.0000 [IU] | Freq: Three times a day (TID) | SUBCUTANEOUS | Status: DC
  Administered 2016-09-08 – 2016-09-10 (×5): 4 [IU] via SUBCUTANEOUS

## 2016-09-08 MED ORDER — PANTOPRAZOLE SODIUM 40 MG PO TBEC
40.0000 mg | DELAYED_RELEASE_TABLET | Freq: Every day | ORAL | Status: DC
  Administered 2016-09-08 – 2016-09-10 (×3): 40 mg via ORAL
  Filled 2016-09-08 (×3): qty 1

## 2016-09-08 MED ORDER — INSULIN ASPART 100 UNIT/ML ~~LOC~~ SOLN
0.0000 [IU] | Freq: Three times a day (TID) | SUBCUTANEOUS | Status: DC
  Administered 2016-09-08: 2 [IU] via SUBCUTANEOUS
  Administered 2016-09-08 – 2016-09-10 (×6): 3 [IU] via SUBCUTANEOUS

## 2016-09-08 MED ORDER — DILTIAZEM HCL ER COATED BEADS 180 MG PO CP24
360.0000 mg | ORAL_CAPSULE | Freq: Every day | ORAL | Status: DC
  Administered 2016-09-08 – 2016-09-10 (×3): 360 mg via ORAL
  Filled 2016-09-08 (×3): qty 2

## 2016-09-08 MED ORDER — INSULIN DETEMIR 100 UNIT/ML ~~LOC~~ SOLN
20.0000 [IU] | Freq: Every day | SUBCUTANEOUS | Status: DC
  Administered 2016-09-08 (×2): 20 [IU] via SUBCUTANEOUS
  Filled 2016-09-08 (×3): qty 0.2

## 2016-09-08 MED ORDER — ONDANSETRON HCL 4 MG/2ML IJ SOLN: 4.0000 mg | Freq: Four times a day (QID) | INTRAMUSCULAR | Status: DC | PRN

## 2016-09-08 MED ORDER — SODIUM CHLORIDE 0.9 % IV BOLUS (SEPSIS)
1000.0000 mL | Freq: Once | INTRAVENOUS | Status: AC
  Administered 2016-09-08: 1000 mL via INTRAVENOUS

## 2016-09-08 MED ORDER — APIXABAN 5 MG PO TABS
5.0000 mg | ORAL_TABLET | Freq: Two times a day (BID) | ORAL | Status: DC
  Administered 2016-09-08 – 2016-09-10 (×6): 5 mg via ORAL
  Filled 2016-09-08 (×6): qty 1

## 2016-09-08 MED ORDER — HYDROCODONE-ACETAMINOPHEN 5-325 MG PO TABS
1.0000 | ORAL_TABLET | ORAL | Status: DC | PRN
  Administered 2016-09-09 (×2): 2 via ORAL
  Filled 2016-09-08 (×2): qty 2

## 2016-09-08 MED ORDER — OSELTAMIVIR PHOSPHATE 30 MG PO CAPS
30.0000 mg | ORAL_CAPSULE | Freq: Once | ORAL | Status: AC
  Administered 2016-09-08: 30 mg via ORAL
  Filled 2016-09-08: qty 1

## 2016-09-08 MED ORDER — ACETAMINOPHEN 325 MG PO TABS
650.0000 mg | ORAL_TABLET | Freq: Four times a day (QID) | ORAL | Status: DC | PRN
  Administered 2016-09-08 – 2016-09-09 (×3): 650 mg via ORAL
  Filled 2016-09-08 (×3): qty 2

## 2016-09-08 MED ORDER — ESCITALOPRAM OXALATE 10 MG PO TABS
10.0000 mg | ORAL_TABLET | Freq: Every day | ORAL | Status: DC
  Administered 2016-09-08 – 2016-09-09 (×2): 10 mg via ORAL
  Filled 2016-09-08 (×2): qty 1

## 2016-09-08 MED ORDER — ONDANSETRON HCL 4 MG PO TABS: 4.0000 mg | ORAL_TABLET | Freq: Four times a day (QID) | ORAL | Status: DC | PRN

## 2016-09-08 NOTE — H&P (Signed)
History and Physical    Oscar [name]:[id number] DOB: 07-09-1946 DOA: 09/07/2016  PCP: Henrine Screws, MD   Patient coming from: Home  Chief Complaint: Cough, lethargy, nausea, vomiting  HPI: Oscar [name] is a 71 y.o. male with medical history significant for atrial fibrillation on Eliquis, chronic diastolic CHF, insulin-dependent diabetes mellitus, hypertension, OSA, and GERD who presents to the emergency department for evaluation of lethargy, cough, and malaise. Patient reports that he had been in his usual state of health until developing a cough several days ago. He had been exposed to a granddaughter with upper respiratory illness around that time. Over the past couple days, the patient has developed profound lethargy and malaise. He has not taken his temperature and denies rigors. He reports that the cough is only occasionally productive of scant thick sputum, but is associated with significant dyspnea. Patient denies any chest pain or palpitations and denies abdominal pain or diarrhea. He notes nausea with an episode of nonbloody nonbilious vomiting en route to the hospital. He has not attempted any interventions for his symptoms prior to coming in. Patient reports suffering a ground-level mechanical fall in his bathroom earlier today, denying any head strike or significant injury with this. He reports falling secondary to profound weakness and lethargy. He denies headache, change in vision or hearing, or focal numbness or weakness.  ED Course: Upon arrival to the ED, patient is found to be afebrile, saturating 87% on room air, mildly tachycardic, and with soft but stable blood pressure. EKG features in atrial fibrillation with left axis deviation and QTc of 541 ms. Chest x-ray is notable for mild cardiomegaly, but no acute cardiopulmonary disease. Chemistry panel reveals a creatinine 1.63, up from an apparent baseline of 1.3. AST is elevated to 200 and ALT to 100. CBC is notable for  stable chronic normocytic anemia with hemoglobin 12.3. Lactic acid is elevated to a value of 4.92 and troponin is borderline elevated at 0.04. Urinalysis is notable only for glucosuria. Influenza swab has been sent and remains pending. Blood and urine cultures were obtained, 1 L normal saline was administered, and the patient was treated with empiric vancomycin and cefepime. Blood pressure is remained soft but stable in the emergency department and the patient is saturating adequately on 3 L/m via nasal cannula. He will be admitted to the telemetry unit for ongoing evaluation and management of acute hypoxic respiratory failure, suspected to be infectious in etiology.  Review of Systems:  All other systems reviewed and apart from HPI, are negative.  Past Medical History:  Diagnosis Date  . Anal fissure    h/o - no recent complications  . Arthritis    hips  . Atrial fibrillation ([place])   . Diabetes mellitus without complication ([place])    improved after diet/exercise  . Enlarged prostate   . GERD (gastroesophageal reflux disease)   . Gout   . H/O hiatal hernia   . Hx of typhoid fever    as a child  . Hypertension    improved after diet/exercise  . Kidney stones   . OSA (obstructive sleep apnea)    wears cpap  . PONV (postoperative nausea and vomiting)    also difficulty waking up  . Stroke [facility]) 08/2013   short term memory loss (slight)    Past Surgical History:  Procedure Laterality Date  . carpel tunnel Bilateral   . CHOLECYSTECTOMY    . COLONOSCOPY    . EYE SURGERY     laser surgery   .  I&D EXTREMITY Left 09/15/2014   Procedure: Excision Necrotic Left Achilles, Skin Graft, apply Wound VAC;  Surgeon: Newt Minion, MD;  Location: [place];  Service: Orthopedics;  Laterality: Left;  . KIDNEY STONE SURGERY    . KNEE ARTHROSCOPY Bilateral   . LIGAMENT REPAIR    . TONSILLECTOMY    . VASECTOMY       reports that he has never smoked. He has never used smokeless tobacco. He reports  that he drinks alcohol. He reports that he does not use drugs.  Allergies  Allergen Reactions  . Iohexol      Desc: HIVES S/P 13HR.PREMEDS, ?LOW DOSAGE PREMEDS   . Ivp Dye [Iodinated Diagnostic Agents]     welps     Family History  Problem Relation Age of Onset  . COPD Father   . Heart attack Sister     died age 13 of MI     Prior to Admission medications   Medication Sig Start Date End Date Taking? Authorizing Provider  acetaminophen (TYLENOL) 500 MG tablet Take 1,500 mg by mouth every 6 (six) hours as needed for moderate pain. Reported on 10/23/2015   Yes Historical Provider, MD  allopurinol (ZYLOPRIM) 300 MG tablet Take 300 mg by mouth 3 (three) times daily as needed. Reported on 12/27/2015 04/30/15  Yes Historical Provider, MD  diltiazem (CARDIZEM CD) 360 MG 24 hr capsule Take 1 capsule (360 mg total) by mouth daily. 02/20/14  Yes Evans Lance, MD  ELIQUIS 5 MG TABS tablet TAKE 1 TABLET BY MOUTH TWICE DAILY 03/07/16  Yes Larey Dresser, MD  escitalopram (LEXAPRO) 10 MG tablet Take 10 mg by mouth at bedtime.    Yes Historical Provider, MD  Febuxostat (ULORIC) 80 MG TABS Take 80 mg by mouth daily.   Yes Historical Provider, MD  furosemide (LASIX) 80 MG tablet Take 1 tablet (80 mg total) by mouth daily. T 01/28/16  Yes Jolaine Artist, MD  glyBURIDE (DIABETA) 5 MG tablet Take 5 mg by mouth 2 (two) times daily with a meal.   Yes Historical Provider, MD  indomethacin (INDOCIN) 50 MG capsule Take 50 mg by mouth 3 (three) times daily as needed (for gout flare ups). Reported on 12/27/2015   Yes Historical Provider, MD  insulin aspart (NOVOLOG) 100 UNIT/ML injection Inject 0-9 Units into the skin 3 (three) times daily as needed for high blood sugar.   Yes Historical Provider, MD  LEVEMIR 100 UNIT/ML injection Inject 20 Units into the skin at bedtime. 05/16/15  Yes Historical Provider, MD  metformin (FORTAMET) 1000 MG (OSM) 24 hr tablet Take 1,000 mg by mouth 2 (two) times daily. 07/23/16   Yes Historical Provider, MD  metolazone (ZAROXOLYN) 2.5 MG tablet Take 1 tablet every Wednesday. 10/23/15  Yes Shaune Pascal Bensimhon, MD  pantoprazole (PROTONIX) 40 MG tablet Take 1 tablet (40 mg total) by mouth daily. 08/08/13  Yes Josetta Huddle, MD  potassium chloride SA (K-DUR,KLOR-CON) 20 MEQ tablet Take 2 tablets (40 mEq total) by mouth daily. 08/25/16  Yes Jolaine Artist, MD  simvastatin (ZOCOR) 20 MG tablet Take one (1) tablet (20 mg total) by mouth on Mondays, Wednesdays, and Fridays.   Yes Historical Provider, MD  tamsulosin (FLOMAX) 0.4 MG CAPS capsule Take 1 capsule (0.4 mg total) by mouth daily after breakfast. 08/08/13  Yes Josetta Huddle, MD  blood glucose meter kit and supplies KIT Dispense based on patient and insurance preference. Use up to four times daily as directed. (FOR  ICD-9 250.00, 250.01). 09/03/14   Theodis Blaze, MD    Physical Exam: Vitals:   09/07/16 2115 09/07/16 2130 09/07/16 2227 09/07/16 2230  BP: 99/68     Pulse: 97 101  103  Resp: _0 Temp:      TempSrc:      SpO2: 91% 93%  98%  Weight:   130.4 kg (287 lb 7.7 oz)   Height:          Constitutional: No acute distress, calm. Appears uncomfortable.  Eyes: PERTLA, lids and conjunctivae normal ENMT: Mucous membranes are moist. Posterior pharynx clear of any exudate or lesions.   Neck: normal, supple, no masses, no thyromegaly Respiratory: Coarse rhonchi at right apex, fine crackles elsewhere. No accessory muscle use. No pallor.  Cardiovascular: Rate ~100 and irregular with grade 3 systolic murmur at apex. No extremity edema. JVP not elevated. Abdomen: No distension, no tenderness, no masses palpated. Bowel sounds normal.  Musculoskeletal: no clubbing / cyanosis. No joint deformity upper and lower extremities. Normal muscle tone.  Skin: no significant rashes, lesions, ulcers. Warm, dry, well-perfused. Neurologic: CN 2-12 grossly intact. Sensation intact, DTR normal. Strength 5/5 in all 4 limbs.    Psychiatric: Normal judgment and insight. Alert and oriented x 3. Normal mood and affect.     Labs on Admission: I have personally reviewed following labs and imaging studies  CBC:  Recent Labs Lab 09/07/16 2124  WBC 6.9  NEUTROABS 5.5  HGB 12.3*  HCT 37.1*  MCV 92.3  PLT 683   Basic Metabolic Panel:  Recent Labs Lab 09/07/16 2124  NA 139  K 3.5  CL 99*  CO2 26  GLUCOSE 280*  BUN 27*  CREATININE 1.63*  CALCIUM 8.9   GFR: Estimated Creatinine Clearance: 55.6 mL/min (by C-G formula based on SCr of 1.63 mg/dL (H)). Liver Function Tests:  Recent Labs Lab 09/07/16 2124  AST 200*  ALT 98*  ALKPHOS 44  BILITOT 1.0  PROT 6.9  ALBUMIN 3.3*   No results for input(s): LIPASE, AMYLASE in the last 168 hours. No results for input(s): AMMONIA in the last 168 hours. Coagulation Profile: No results for input(s): INR, PROTIME in the last 168 hours. Cardiac Enzymes:  Recent Labs Lab 09/07/16 2226  TROPONINI 0.04*   BNP (last 3 results) No results for input(s): PROBNP in the last 8760 hours. HbA1C: No results for input(s): HGBA1C in the last 72 hours. CBG: No results for input(s): GLUCAP in the last 168 hours. Lipid Profile: No results for input(s): CHOL, HDL, LDLCALC, TRIG, CHOLHDL, LDLDIRECT in the last 72 hours. Thyroid Function Tests: No results for input(s): TSH, T4TOTAL, FREET4, T3FREE, THYROIDAB in the last 72 hours. Anemia Panel: No results for input(s): VITAMINB12, FOLATE, FERRITIN, TIBC, IRON, RETICCTPCT in the last 72 hours. Urine analysis:    Component Value Date/Time   COLORURINE YELLOW 09/07/2016 2352   APPEARANCEUR CLEAR 09/07/2016 2352   LABSPEC 1.014 09/07/2016 2352   PHURINE 5.0 09/07/2016 2352   GLUCOSEU 150 (A) 09/07/2016 2352   HGBUR NEGATIVE 09/07/2016 2352   BILIRUBINUR NEGATIVE 09/07/2016 2352   KETONESUR NEGATIVE 09/07/2016 2352   PROTEINUR NEGATIVE 09/07/2016 2352   UROBILINOGEN 0.2 08/07/2013 0201   NITRITE NEGATIVE 09/07/2016  2352   LEUKOCYTESUR NEGATIVE 09/07/2016 2352   Sepsis Labs: _1 (procalcitonin:4,lacticidven:4) )No results found for this or any previous visit (from the past 240 hour(s)).   Radiological Exams on Admission: Dg Chest 2 View  Result Date: 09/07/2016 CLINICAL DATA:  Fall  today with dizziness and lightheadedness. EXAM: CHEST  2 VIEW COMPARISON:  06/10/2010 FINDINGS: Lungs are adequately inflated without focal airspace consolidation or effusion. There is mild stable cardiomegaly. The posterior mid to upper lungs are cut off the lateral film. There mild degenerate changes of the spine. IMPRESSION: No active cardiopulmonary disease. Mild cardiomegaly. Electronically Signed   By: Marin Olp M.D.   On: 09/07/2016 21:59   Ct Head Wo Contrast  Result Date: 09/07/2016 CLINICAL DATA:  Fall in bathroom heating head into tub. Some neck pain. EXAM: CT HEAD WITHOUT CONTRAST CT CERVICAL SPINE WITHOUT CONTRAST TECHNIQUE: Multidetector CT imaging of the head and cervical spine was performed following the standard protocol without intravenous contrast. Multiplanar CT image reconstructions of the cervical spine were also generated. COMPARISON:  Head CT 07/30/2013 FINDINGS: CT HEAD FINDINGS Brain: Ventricles, cisterns and other CSF spaces are within normal. There is chronic ischemic microvascular disease. There is no mass, mass effect, shift of midline structures or acute hemorrhage. Small old cerebellar infarcts. Old lacune infarct over the right thalamus. No acute infarction. Vascular: Calcified plaque over the cavernous segment of the internal carotid arteries and vertebral arteries. Skull: Within normal. Sinuses/Orbits: Within normal. Other: None. CT CERVICAL SPINE FINDINGS Alignment: Within normal. Skull base and vertebrae: Mild spondylosis throughout the cervical spine. Mild uncovertebral joint spurring is present as well as mild facet arthropathy. Mild bilateral neural foraminal narrowing at multiple levels  due to adjacent bony spurring. No acute fracture. Soft tissues and spinal canal: Prevertebral soft tissues are within normal. Spinal canal within normal. Disc levels: Disc space narrowing at the C6-7 level greater than the C5-6 level. Upper chest: Within normal. Other: None. IMPRESSION: No acute intracranial findings. Chronic ischemic microvascular disease. Old lacune infarct over the right thalamus and small old cerebellar infarcts. No acute cervical spine injury. Mild spondylosis of the cervical spine with disc disease at the C5-6 and C6-7 levels. Bilateral neural foramina narrowing at multiple levels due to adjacent bony spurring. Electronically Signed   By: Marin Olp M.D.   On: 09/07/2016 22:20   Ct Cervical Spine Wo Contrast  Result Date: 09/07/2016 CLINICAL DATA:  Fall in bathroom heating head into tub. Some neck pain. EXAM: CT HEAD WITHOUT CONTRAST CT CERVICAL SPINE WITHOUT CONTRAST TECHNIQUE: Multidetector CT imaging of the head and cervical spine was performed following the standard protocol without intravenous contrast. Multiplanar CT image reconstructions of the cervical spine were also generated. COMPARISON:  Head CT 07/30/2013 FINDINGS: CT HEAD FINDINGS Brain: Ventricles, cisterns and other CSF spaces are within normal. There is chronic ischemic microvascular disease. There is no mass, mass effect, shift of midline structures or acute hemorrhage. Small old cerebellar infarcts. Old lacune infarct over the right thalamus. No acute infarction. Vascular: Calcified plaque over the cavernous segment of the internal carotid arteries and vertebral arteries. Skull: Within normal. Sinuses/Orbits: Within normal. Other: None. CT CERVICAL SPINE FINDINGS Alignment: Within normal. Skull base and vertebrae: Mild spondylosis throughout the cervical spine. Mild uncovertebral joint spurring is present as well as mild facet arthropathy. Mild bilateral neural foraminal narrowing at multiple levels due to adjacent  bony spurring. No acute fracture. Soft tissues and spinal canal: Prevertebral soft tissues are within normal. Spinal canal within normal. Disc levels: Disc space narrowing at the C6-7 level greater than the C5-6 level. Upper chest: Within normal. Other: None. IMPRESSION: No acute intracranial findings. Chronic ischemic microvascular disease. Old lacune infarct over the right thalamus and small old cerebellar infarcts. No acute cervical spine  injury. Mild spondylosis of the cervical spine with disc disease at the C5-6 and C6-7 levels. Bilateral neural foramina narrowing at multiple levels due to adjacent bony spurring. Electronically Signed   By: Marin Olp M.D.   On: 09/07/2016 22:20   US Abdomen Complete  Result Date: 09/07/2016 CLINICAL DATA:  Elevated LFTs.  Nausea today.  Lactic acidosis. EXAM: ABDOMEN ULTRASOUND COMPLETE COMPARISON:  Abdominal CT 05/24/2010 FINDINGS: Gallbladder: Surgically absent. Common bile duct: Diameter: 4 mm, normal. Liver: No focal lesion identified. Diffusely increased and heterogeneous in parenchymal echogenicity. Elongated left lobe extending across the midline, as seen on prior CT. Normal directional flow in the imaged portal vein. IVC: No abnormality visualized. Pancreas: Not well evaluated sonographically. Spleen: No focal abnormality. Upper limits of normal in size spanning 12.9 cm. Right Kidney: Length: 14.0 cm. Echogenicity within normal limits. There is thinning of renal parenchyma. No mass or hydronephrosis visualized. Cysts on prior CT are not well visualized sonographically. Left Kidney: Length: 13.9 cm. Echogenicity within normal limits. There is thinning of renal parenchyma. No mass or hydronephrosis visualized. No shadowing calculi. Abdominal aorta: No aneurysm visualized. Mid and distal aorta are not visualized. Other findings: None.  No ascites. IMPRESSION: 1. Hepatic steatosis and probable hepatomegaly.  No focal lesion. 2. Postcholecystectomy without biliary  dilatation. 3. Thinning of bilateral renal parenchyma, no hydronephrosis. 4. Midline structures including pancreas and abdominal aorta are not well visualized sonographically due to habitus and bowel gas. Electronically Signed   By: Jeb Levering M.D.   On: 09/07/2016 23:25    EKG: Independently reviewed. Atrial fibrillation, LAD, QTc 541  Assessment/Plan  1. Influenza A, acute hypoxic respiratory failure  - Pt presents with cough, lethargy, malaise; found to be hypoxic with elevated lactate and AKI - CXR without infiltrate, but rhonchi noted over right apex; influenza A is positive  - Blood cultures are incubating and sputum culture requested  - Pt was treated with empiric vancomycin and Zosyn; plan to continue these for now given sepsis picture with soft BP lactate >4 - Continue Tamiflu BID  - Continue supplemental O2 prn, supportive care with APAP, IVF, anti-emetics   2. AKI superimposed on CKD stage II  - SCr is 1.63 on admission, up from apparent baseline of 1.3  - Likely a prerenal etiology given the sepsis physiology  - Hold diuretics on admission, continue IVF as needed for BP-support  - Repeat chem panel in am   3. CAD, elevated troponin - There are no anginal complaints on admission - Troponin very mildly elevated to 0.04, likely representing demand ischemia in setting of sepsis  - Plan to monitor on telemetry for ischemic changes, trend troponin, continue Zocor  4. Chronic atrial fibrillation  - CHADS-VASc is 5 (age, CAD, HTN, DM, CHF) - Continue Eliquis, diltiazem    5. Chronic diastolic CHF  - Pt appears dry on admission in setting of sepsis  - He was given 1 liter NS in ED and another on admission  - TTE (06/12/15) with EF 55-60%, mild MR  - Managed with Lasix and metolazone at home; these are held on admission as above in setting of AKI and soft BP  - Follow daily wts and I/O's  6. Insulin-dependent DM  - A1c was 9.1% in January 2016  - Managed at home with  Levemir 20 units qHS, Novolog 0-9 TID, glyburide, and metformin; will hold the oral agents  - Check CBG with meals and qHS  - Continue Levemir 20 units qHS, start  4 units Novolog with meals, and a moderate-intensity sliding-scale correctional  7. OSA  - Continue CPAP qHS    DVT prophylaxis: Eliquis  Code Status: Full  Family Communication: Discussed with patient Disposition Plan: Admit to telemetry Consults called: None Admission status: Inpatient    Vianne Bulls, MD [facility] Pager [phone]  If 7PM-7AM, please contact night-coverage [email or web address] Password [facility]  09/08/2016, 12:29 AM

## 2016-09-08 NOTE — Progress Notes (Signed)
Rapid response nurse called about increase in lactic acid.  Stated to repeat lactic acid in 3 hours.  New order placed.  Will continue to monitor.

## 2016-09-08 NOTE — Progress Notes (Signed)
Dr. Aileen Fass paged with patient's vital signs this morning.  Will continue to monitor.

## 2016-09-08 NOTE — Progress Notes (Signed)
Patient temperature 100.3. MD notified. Tylenol 650 mg given. Will continue to monitor.

## 2016-09-08 NOTE — Progress Notes (Signed)
CRITICAL VALUE ALERT  Critical value received:  Lactic Acid 2.4  Date of notification:  09/08/16  Time of notification:  0202  Critical value read back:Yes.    Nurse who received alert:  Waynetta Sandy, RN  MD notified (1st page):  Dr. Myna Hidalgo   Time of first page:  0203  MD notified (2nd page):  Time of second page:  Responding MD:  Dr. Myna Hidalgo  Time MD responded:  [phone]

## 2016-09-08 NOTE — Progress Notes (Signed)
Patient arrived to 3e14 from emergency room.  Patient ambulated with standby assistance to the bed. Patient has no complaints of pain, only a cough which he would like some cough medicine for.  Patient oriented to the room and educated on how to call for help.  Patient advised to call for assistance before getting up since he had a fall at home yesterday.  Bed alarm turned on.  Will continue to monitor.

## 2016-09-08 NOTE — Progress Notes (Signed)
CRITICAL VALUE ALERT  Critical value received:  Lactic acid 2.4   Date of notification:  09/08/16   Time of notification:  0800  Critical value read back: yes   Nurse who received alert:  Anayla Giannetti  MD notified (1st page):  Dr. Aileen Fass  Time of first page:  0900  Notified in person

## 2016-09-08 NOTE — Discharge Instructions (Signed)

## 2016-09-08 NOTE — Progress Notes (Signed)
[facility] PROGRESS NOTE    Progress Note  Oscar [name]  [id number] DOB: 09/12/1945 DOA: 09/07/2016 PCP: Henrine Screws, MD     Brief Narrative:   Oscar [name] is an 71 y.o. male past medical history of chronic atrial fibrillation on Eluquis, chronic diastolic heart failure, insulin-dependent diabetes mellitus with a last A1c 2 years ago of 9.1, who presents to the ED with lethargy, malaise, some family members had a respiratory tract infection, he has felt nauseated and vomited once nonbloody. In the ED he was found to be satting 87% on room air mildly tachycardic with stable blood pressure.  Assessment/Plan:   Acute respiratory failure with hypoxia due to influenza A/SIRS: Influenza A positive started empirically on Tamiflu, chest x-ray did not show any infiltrates. Blood cultures and sputum cultures are pending. Started empirically on admission on IV vancomycin and Zosyn due to an elevated lactate which is improved to 2.4. Now on IV cefepime for possible infectious etiology. We will have a low threshold to DC antibiotics We'll give an additional liter of normal saline he has received 2 L of normal saline ed. Continue IV fluid hydration. Patient had breakfast this morning when she related was not very tasteful.  Chronic renal disease stage III: Baseline creatinine of 1. 3- 1.5. Agree with holding diuretics and antihypertensive medication.  Mild elevation in troponins: He denies any angina symptoms, cardiac biomarkers have been flat. No ischemic changes on EKG, continue Zocor. In the setting of chronic renal disease.  Chronic atrial fibrillation: CHADS-VASc is 5 (age, CAD, HTN, DM, CHF) - Continue Eliquis, diltiazem.  Chronic diastolic CHF  - Pt appears dry on admission in setting of sepsis  - He was given 1 liter NS in ED and another on admission  - TTE (06/12/15) with EF 55-60%, mild MR.  Diabetes mellitus type 2, uncontrolled, with complications  (HCC) Last A1c was 9.1. Hold oral hypoglycemic agents. Continue CBGs before meals and at bedtime. Blood glucose is improving with long-acting insulin plus sliding scale insulin.  Essential Hypertension Continue hold antihypertensive medication can give diltiazem.  OSA (obstructive sleep apnea) Cont c-pap.  Acute encephalopathy Now back to baseline he relates he feels mildly improved compared to yesterday.  DVT prophylaxis: lovenox Family Communication:none Disposition Plan/Barrier to D/C: home after afebrile for 24 hrs Code Status:     Code Status Orders        Start     Ordered   09/08/16 0027  Full code  Continuous     09/08/16 0029    Code Status History    Date Active Date Inactive Code Status Order ID Comments User Context   09/15/2014  5:01 PM 09/18/2014  5:30 PM Full Code [id number]  Newt Minion, MD Inpatient   08/28/2014  5:58 AM 09/03/2014  7:08 PM Full Code [id number]  Berle Mull, MD ED   08/07/2013  3:34 AM 08/08/2013  7:55 PM Full Code [id number]  Theressa Millard, MD Inpatient   11/16/2012  8:53 AM 11/18/2012 12:32 PM Full Code [id number]  Bynum Bellows, MD ED    Advance Directive Documentation   [place] Most Recent Value  Type of Advance Directive  Living will, Healthcare Power of Attorney  Pre-existing out of facility DNR order (yellow form or pink MOST form)  No data  "MOST" Form in Place?  No data        IV Access:    Peripheral IV   Procedures and diagnostic studies:   Dg  Chest 2 View  Result Date: 09/07/2016 CLINICAL DATA:  Fall today with dizziness and lightheadedness. EXAM: CHEST  2 VIEW COMPARISON:  06/10/2010 FINDINGS: Lungs are adequately inflated without focal airspace consolidation or effusion. There is mild stable cardiomegaly. The posterior mid to upper lungs are cut off the lateral film. There mild degenerate changes of the spine. IMPRESSION: No active cardiopulmonary disease. Mild cardiomegaly. Electronically Signed   By: Marin Olp M.D.   On: 09/07/2016 21:59   Ct Head Wo Contrast  Result Date: 09/07/2016 CLINICAL DATA:  Fall in bathroom heating head into tub. Some neck pain. EXAM: CT HEAD WITHOUT CONTRAST CT CERVICAL SPINE WITHOUT CONTRAST TECHNIQUE: Multidetector CT imaging of the head and cervical spine was performed following the standard protocol without intravenous contrast. Multiplanar CT image reconstructions of the cervical spine were also generated. COMPARISON:  Head CT 07/30/2013 FINDINGS: CT HEAD FINDINGS Brain: Ventricles, cisterns and other CSF spaces are within normal. There is chronic ischemic microvascular disease. There is no mass, mass effect, shift of midline structures or acute hemorrhage. Small old cerebellar infarcts. Old lacune infarct over the right thalamus. No acute infarction. Vascular: Calcified plaque over the cavernous segment of the internal carotid arteries and vertebral arteries. Skull: Within normal. Sinuses/Orbits: Within normal. Other: None. CT CERVICAL SPINE FINDINGS Alignment: Within normal. Skull base and vertebrae: Mild spondylosis throughout the cervical spine. Mild uncovertebral joint spurring is present as well as mild facet arthropathy. Mild bilateral neural foraminal narrowing at multiple levels due to adjacent bony spurring. No acute fracture. Soft tissues and spinal canal: Prevertebral soft tissues are within normal. Spinal canal within normal. Disc levels: Disc space narrowing at the C6-7 level greater than the C5-6 level. Upper chest: Within normal. Other: None. IMPRESSION: No acute intracranial findings. Chronic ischemic microvascular disease. Old lacune infarct over the right thalamus and small old cerebellar infarcts. No acute cervical spine injury. Mild spondylosis of the cervical spine with disc disease at the C5-6 and C6-7 levels. Bilateral neural foramina narrowing at multiple levels due to adjacent bony spurring. Electronically Signed   By: Marin Olp M.D.   On: 09/07/2016  22:20   Ct Cervical Spine Wo Contrast  Result Date: 09/07/2016 CLINICAL DATA:  Fall in bathroom heating head into tub. Some neck pain. EXAM: CT HEAD WITHOUT CONTRAST CT CERVICAL SPINE WITHOUT CONTRAST TECHNIQUE: Multidetector CT imaging of the head and cervical spine was performed following the standard protocol without intravenous contrast. Multiplanar CT image reconstructions of the cervical spine were also generated. COMPARISON:  Head CT 07/30/2013 FINDINGS: CT HEAD FINDINGS Brain: Ventricles, cisterns and other CSF spaces are within normal. There is chronic ischemic microvascular disease. There is no mass, mass effect, shift of midline structures or acute hemorrhage. Small old cerebellar infarcts. Old lacune infarct over the right thalamus. No acute infarction. Vascular: Calcified plaque over the cavernous segment of the internal carotid arteries and vertebral arteries. Skull: Within normal. Sinuses/Orbits: Within normal. Other: None. CT CERVICAL SPINE FINDINGS Alignment: Within normal. Skull base and vertebrae: Mild spondylosis throughout the cervical spine. Mild uncovertebral joint spurring is present as well as mild facet arthropathy. Mild bilateral neural foraminal narrowing at multiple levels due to adjacent bony spurring. No acute fracture. Soft tissues and spinal canal: Prevertebral soft tissues are within normal. Spinal canal within normal. Disc levels: Disc space narrowing at the C6-7 level greater than the C5-6 level. Upper chest: Within normal. Other: None. IMPRESSION: No acute intracranial findings. Chronic ischemic microvascular disease. Old lacune infarct over the  right thalamus and small old cerebellar infarcts. No acute cervical spine injury. Mild spondylosis of the cervical spine with disc disease at the C5-6 and C6-7 levels. Bilateral neural foramina narrowing at multiple levels due to adjacent bony spurring. Electronically Signed   By: Marin Olp M.D.   On: 09/07/2016 22:20   US  Abdomen Complete  Result Date: 09/07/2016 CLINICAL DATA:  Elevated LFTs.  Nausea today.  Lactic acidosis. EXAM: ABDOMEN ULTRASOUND COMPLETE COMPARISON:  Abdominal CT 05/24/2010 FINDINGS: Gallbladder: Surgically absent. Common bile duct: Diameter: 4 mm, normal. Liver: No focal lesion identified. Diffusely increased and heterogeneous in parenchymal echogenicity. Elongated left lobe extending across the midline, as seen on prior CT. Normal directional flow in the imaged portal vein. IVC: No abnormality visualized. Pancreas: Not well evaluated sonographically. Spleen: No focal abnormality. Upper limits of normal in size spanning 12.9 cm. Right Kidney: Length: 14.0 cm. Echogenicity within normal limits. There is thinning of renal parenchyma. No mass or hydronephrosis visualized. Cysts on prior CT are not well visualized sonographically. Left Kidney: Length: 13.9 cm. Echogenicity within normal limits. There is thinning of renal parenchyma. No mass or hydronephrosis visualized. No shadowing calculi. Abdominal aorta: No aneurysm visualized. Mid and distal aorta are not visualized. Other findings: None.  No ascites. IMPRESSION: 1. Hepatic steatosis and probable hepatomegaly.  No focal lesion. 2. Postcholecystectomy without biliary dilatation. 3. Thinning of bilateral renal parenchyma, no hydronephrosis. 4. Midline structures including pancreas and abdominal aorta are not well visualized sonographically due to habitus and bowel gas. Electronically Signed   By: Jeb Levering M.D.   On: 09/07/2016 23:25     Medical Consultants:    None.  Anti-Infectives:   Vanco and cefepime.  Subjective:    Oscar [name] he relates he is thirsty feels mildly better than yesterday, did not like this morning but he is hungry.  Objective:    Vitals:   09/08/16 0115 09/08/16 0148 09/08/16 0606 09/08/16 0741  BP: 105/57 119/61 (!) 100/48 (!) 91/51  Pulse: 105 84 (!) 107 (!) 119  Resp: 13 16 20 18   Temp:  98.8 F  (37.1 C) (!) 100.8 F (38.2 C) 98 F (36.7 C)  TempSrc:  Oral Oral Oral  SpO2: 95% 95% 94% 97%  Weight:  132.1 kg (291 lb 4.8 oz)    Height:  5\' 9"  (1.753 m)      Intake/Output Summary (Last 24 hours) at 09/08/16 0950 Last data filed at 09/08/16 0913  Gross per 24 hour  Intake             1680 ml  Output              350 ml  Net             1330 ml   Filed Weights   09/07/16 2047 09/07/16 2227 09/08/16 0148  Weight: 136.1 kg (300 lb) 130.4 kg (287 lb 7.7 oz) 132.1 kg (291 lb 4.8 oz)    Exam: General exam: In no acute distress, morbidly obese Respiratory system: Good air movement and clear to auscultation. Cardiovascular system: S1 & S2 heard, RRR. No JVD. Gastrointestinal system: Abdomen is nondistended, soft and nontender.  Extremities: No pedal edema. Skin: No rashes, lesions or ulcers Psychiatry: Judgement and insight appear normal. Mood & affect appropriate.    Data Reviewed:    Labs: Basic Metabolic Panel:  Recent Labs Lab 09/07/16 2124  NA 139  K 3.5  CL 99*  CO2 26  GLUCOSE 280*  BUN 27*  CREATININE 1.63*  CALCIUM 8.9   GFR Estimated Creatinine Clearance: 56 mL/min (by C-G formula based on SCr of 1.63 mg/dL (H)). Liver Function Tests:  Recent Labs Lab 09/07/16 2124  AST 200*  ALT 98*  ALKPHOS 44  BILITOT 1.0  PROT 6.9  ALBUMIN 3.3*   No results for input(s): LIPASE, AMYLASE in the last 168 hours. No results for input(s): AMMONIA in the last 168 hours. Coagulation profile No results for input(s): INR, PROTIME in the last 168 hours.  CBC:  Recent Labs Lab 09/07/16 2124  WBC 6.9  NEUTROABS 5.5  HGB 12.3*  HCT 37.1*  MCV 92.3  PLT 165   Cardiac Enzymes:  Recent Labs Lab 09/07/16 2226 09/08/16 0107 09/08/16 0657  TROPONINI 0.04* 0.04* 0.05*   BNP (last 3 results) No results for input(s): PROBNP in the last 8760 hours. CBG:  Recent Labs Lab 09/08/16 0144 09/08/16 0558  GLUCAP 275* 178*   D-Dimer: No results for  input(s): DDIMER in the last 72 hours. Hgb A1c: No results for input(s): HGBA1C in the last 72 hours. Lipid Profile: No results for input(s): CHOL, HDL, LDLCALC, TRIG, CHOLHDL, LDLDIRECT in the last 72 hours. Thyroid function studies: No results for input(s): TSH, T4TOTAL, T3FREE, THYROIDAB in the last 72 hours.  Invalid input(s): FREET3 Anemia work up: No results for input(s): VITAMINB12, FOLATE, FERRITIN, TIBC, IRON, RETICCTPCT in the last 72 hours. Sepsis Labs:  Recent Labs Lab 09/07/16 2124 09/07/16 2139 09/08/16 0106 09/08/16 0107 09/08/16 0305 09/08/16 0657  PROCALCITON  --   --   --  0.31  --   --   WBC 6.9  --   --   --   --   --   LATICACIDVEN  --  4.92* 2.4*  --  3.5* 2.4*   Microbiology No results found for this or any previous visit (from the past 240 hour(s)).   Medications:   . apixaban  5 mg Oral BID  . ceFEPime (MAXIPIME) IV  2 g Intravenous Q24H  . diltiazem  360 mg Oral Daily  . escitalopram  10 mg Oral QHS  . insulin aspart  0-15 Units Subcutaneous TID WC  . insulin aspart  0-5 Units Subcutaneous QHS  . insulin aspart  4 Units Subcutaneous TID WC  . insulin detemir  20 Units Subcutaneous QHS  . mouth rinse  15 mL Mouth Rinse BID  . oseltamivir  30 mg Oral BID  . pantoprazole  40 mg Oral Daily  . simvastatin  20 mg Oral Q M,W,F-1800  . sodium chloride flush  3 mL Intravenous Q12H  . tamsulosin  0.4 mg Oral QPC breakfast  . vancomycin  1,500 mg Intravenous Q24H   Continuous Infusions:  Time spent: 25 min   LOS: 0 days   Charlynne Cousins  [facility] Pager [phone]  *Please refer to [email or web address], password [email or web address] to get updated schedule on who will round on this patient, as hospitalists switch teams weekly. If 7PM-7AM, please contact night-coverage at [email or web address], password [email or web address] for any overnight needs.  09/08/2016, 9:50 AM

## 2016-09-08 NOTE — Progress Notes (Signed)
CRITICAL VALUE ALERT  Critical value received:  Lactic acid 3.5  Date of notification:  09/08/16  Time of notification:  [id number]  Critical value read back:Yes.    Nurse who received alert:  Waynetta Sandy, RN  MD notified (1st page):  Dr. Ara Kussmaul  Time of first page:  [phone]  MD notified (2nd page):  Time of second page:  Responding MD:  Dr. Ara Kussmaul  Time MD responded:  0500

## 2016-09-08 NOTE — Progress Notes (Signed)
Patient has home CPAP unit at bedside. Patient is able to place self on and off when ready.

## 2016-09-09 LAB — GLUCOSE, CAPILLARY
GLUCOSE-CAPILLARY: 189 mg/dL — AB (ref 65–99)
Glucose-Capillary: 151 mg/dL — ABNORMAL HIGH (ref 65–99)
Glucose-Capillary: 199 mg/dL — ABNORMAL HIGH (ref 65–99)
Glucose-Capillary: 214 mg/dL — ABNORMAL HIGH (ref 65–99)

## 2016-09-09 LAB — BASIC METABOLIC PANEL
Anion gap: 10 (ref 5–15)
BUN: 18 mg/dL (ref 6–20)
CALCIUM: 8.2 mg/dL — AB (ref 8.9–10.3)
CO2: 24 mmol/L (ref 22–32)
CREATININE: 1.14 mg/dL (ref 0.61–1.24)
Chloride: 104 mmol/L (ref 101–111)
GFR calc non Af Amer: >60 mL/min (ref >60)
GLUCOSE: 233 mg/dL — AB (ref 65–99)
Potassium: 3.6 mmol/L (ref 3.5–5.1)
Sodium: 138 mmol/L (ref 135–145)

## 2016-09-09 LAB — URINE CULTURE: Culture: NO GROWTH

## 2016-09-09 LAB — HEMOGLOBIN A1C
HEMOGLOBIN A1C: 10.1 % — AB (ref 4.8–5.6)
MEAN PLASMA GLUCOSE: 243 mg/dL

## 2016-09-09 MED ORDER — METFORMIN HCL ER 500 MG PO TB24
1000.0000 mg | ORAL_TABLET | Freq: Two times a day (BID) | ORAL | Status: DC
  Administered 2016-09-09 – 2016-09-10 (×3): 1000 mg via ORAL
  Filled 2016-09-09 (×3): qty 2

## 2016-09-09 MED ORDER — METOLAZONE 2.5 MG PO TABS
2.5000 mg | ORAL_TABLET | ORAL | Status: DC
  Administered 2016-09-10: 2.5 mg via ORAL
  Filled 2016-09-09: qty 1

## 2016-09-09 MED ORDER — FUROSEMIDE 80 MG PO TABS
80.0000 mg | ORAL_TABLET | Freq: Every day | ORAL | Status: DC
  Administered 2016-09-09 – 2016-09-10 (×2): 80 mg via ORAL
  Filled 2016-09-09 (×2): qty 1

## 2016-09-09 MED ORDER — POTASSIUM CHLORIDE CRYS ER 20 MEQ PO TBCR
40.0000 meq | EXTENDED_RELEASE_TABLET | Freq: Every day | ORAL | Status: DC
  Administered 2016-09-09 – 2016-09-10 (×2): 40 meq via ORAL
  Filled 2016-09-09 (×2): qty 2

## 2016-09-09 MED ORDER — METFORMIN HCL ER 500 MG PO TB24: 1000.0000 mg | ORAL_TABLET | Freq: Two times a day (BID) | ORAL | Status: DC

## 2016-09-09 MED ORDER — INSULIN DETEMIR 100 UNIT/ML ~~LOC~~ SOLN
20.0000 [IU] | Freq: Every day | SUBCUTANEOUS | Status: DC
  Administered 2016-09-09: 20 [IU] via SUBCUTANEOUS
  Filled 2016-09-09: qty 0.2

## 2016-09-09 NOTE — Progress Notes (Signed)
Inpatient Diabetes Program Recommendations  AACE/ADA: New Consensus Statement on Inpatient Glycemic Control (2015)  Target Ranges:  Prepandial:   less than 140 mg/dL      Peak postprandial:   less than 180 mg/dL (1-2 hours)      Critically ill patients:  140 - 180 mg/dL   Lab Results  Component Value Date   GLUCAP 189 (H) 09/09/2016   HGBA1C 10.1 (H) 09/08/2016    Review of Glycemic Control  Spoke with pt and daughter @ bedside about A1C 10.1 (243) results and explained what an A1C is, basic pathophysiology of DM Type 2, basic home care, basic diabetes diet nutrition principles, importance of checking CBGs and maintaining good CBG control to prevent long-term and short-term complications. Reviewed signs and symptoms of hyperglycemia and hypoglycemia and how to treat hypoglycemia at home. Also reviewed blood sugar goals at home.  Patient's daughter is a [employer] scientist and interested in assisting patient to decrease his A1c. Patient has not been taking his Novolog in timing with meals and agrees to start taking ac meals.   Thank you, Nani Gasser. Collin Hendley, RN, MSN, CDE Inpatient Glycemic Control Team Team Pager [phone] (8am-5pm) 09/09/2016 1:13 PM

## 2016-09-09 NOTE — Progress Notes (Signed)
[facility] PROGRESS NOTE    Progress Note  SUE MCALEXANDER  HRC:[id number] DOB: August 29, 1945 DOA: 09/07/2016 PCP: Henrine Screws, MD     Brief Narrative:   Oscar [name] is an 71 y.o. male past medical history of chronic atrial fibrillation on Eluquis, chronic diastolic heart failure, insulin-dependent diabetes mellitus with a last A1c 2 years ago of 9.1, who presents to the ED with lethargy, malaise, some family members had a respiratory tract infection, he has felt nauseated and vomited once nonbloody. In the ED he was found to be satting 87% on room air mildly tachycardic with stable blood pressure.  Assessment/Plan:   Acute respiratory failure with hypoxia due to influenza A/SIRS: Influenza A positive started empirically on Tamiflu, chest x-ray did not show any infiltrates. Blood cultures and sputum cultures are pending. Started empirically on admission on IV vancomycin and Zosyn due to an elevated lactate which is improved to 2.4. Now on IV cefepime for possible infectious etiology.  D/c empiric abx an monitor ovenight if no fever can be d/c in am.  Chronic renal disease stage III: Baseline creatinine of 1. 3- 1.5. Agree with holding diuretics and antihypertensive medication. Resume lasix.  Mild elevation in troponins: He denies any angina symptoms, cardiac biomarkers have been flat. No ischemic changes on EKG, continue Zocor. In the setting of chronic renal disease.  Chronic atrial fibrillation: CHADS-VASc is 94 (age, CAD, HTN, DM, CHF) Continue Eliquis, diltiazem.  Chronic diastolic CHF  - Pt appears dry on admission in setting of sepsis  - Resume lasix ans metolazone. - TTE (06/12/15) with EF 55-60%, mild MR.  Diabetes mellitus type 2, uncontrolled, with complications (HCC) Last A1c was 9.1. Resume metformin in am once b-met back. Blood glucose is improving with long-acting insulin plus sliding scale insulin.  Essential Hypertension Continue hold  antihypertensive medication can give diltiazem.  OSA (obstructive sleep apnea) Cont c-pap.  Acute encephalopathy Now back to baseline.  DVT prophylaxis: lovenox Family Communication:none Disposition Plan/Barrier to D/C: home after afebrile for 24 hrs Code Status:     Code Status Orders        Start     Ordered   09/08/16 0027  Full code  Continuous     09/08/16 0029    Code Status History    Date Active Date Inactive Code Status Order ID Comments User Context   09/15/2014  5:01 PM 09/18/2014  5:30 PM Full Code [id number]  Newt Minion, MD Inpatient   08/28/2014  5:58 AM 09/03/2014  7:08 PM Full Code [id number]  Berle Mull, MD ED   08/07/2013  3:34 AM 08/08/2013  7:55 PM Full Code [id number]  Theressa Millard, MD Inpatient   11/16/2012  8:53 AM 11/18/2012 12:32 PM Full Code [id number]  Bynum Bellows, MD ED    Advance Directive Documentation   [place] Most Recent Value  Type of Advance Directive  Living will, Healthcare Power of Attorney  Pre-existing out of facility DNR order (yellow form or pink MOST form)  No data  "MOST" Form in Place?  No data        IV Access:    Peripheral IV   Procedures and diagnostic studies:   Dg Chest 2 View  Result Date: 09/07/2016 CLINICAL DATA:  Fall today with dizziness and lightheadedness. EXAM: CHEST  2 VIEW COMPARISON:  06/10/2010 FINDINGS: Lungs are adequately inflated without focal airspace consolidation or effusion. There is mild stable cardiomegaly. The posterior mid to upper lungs are cut  off the lateral film. There mild degenerate changes of the spine. IMPRESSION: No active cardiopulmonary disease. Mild cardiomegaly. Electronically Signed   By: Marin Olp M.D.   On: 09/07/2016 21:59   Ct Head Wo Contrast  Result Date: 09/07/2016 CLINICAL DATA:  Fall in bathroom heating head into tub. Some neck pain. EXAM: CT HEAD WITHOUT CONTRAST CT CERVICAL SPINE WITHOUT CONTRAST TECHNIQUE: Multidetector CT imaging of the head and cervical  spine was performed following the standard protocol without intravenous contrast. Multiplanar CT image reconstructions of the cervical spine were also generated. COMPARISON:  Head CT 07/30/2013 FINDINGS: CT HEAD FINDINGS Brain: Ventricles, cisterns and other CSF spaces are within normal. There is chronic ischemic microvascular disease. There is no mass, mass effect, shift of midline structures or acute hemorrhage. Small old cerebellar infarcts. Old lacune infarct over the right thalamus. No acute infarction. Vascular: Calcified plaque over the cavernous segment of the internal carotid arteries and vertebral arteries. Skull: Within normal. Sinuses/Orbits: Within normal. Other: None. CT CERVICAL SPINE FINDINGS Alignment: Within normal. Skull base and vertebrae: Mild spondylosis throughout the cervical spine. Mild uncovertebral joint spurring is present as well as mild facet arthropathy. Mild bilateral neural foraminal narrowing at multiple levels due to adjacent bony spurring. No acute fracture. Soft tissues and spinal canal: Prevertebral soft tissues are within normal. Spinal canal within normal. Disc levels: Disc space narrowing at the C6-7 level greater than the C5-6 level. Upper chest: Within normal. Other: None. IMPRESSION: No acute intracranial findings. Chronic ischemic microvascular disease. Old lacune infarct over the right thalamus and small old cerebellar infarcts. No acute cervical spine injury. Mild spondylosis of the cervical spine with disc disease at the C5-6 and C6-7 levels. Bilateral neural foramina narrowing at multiple levels due to adjacent bony spurring. Electronically Signed   By: Marin Olp M.D.   On: 09/07/2016 22:20   Ct Cervical Spine Wo Contrast  Result Date: 09/07/2016 CLINICAL DATA:  Fall in bathroom heating head into tub. Some neck pain. EXAM: CT HEAD WITHOUT CONTRAST CT CERVICAL SPINE WITHOUT CONTRAST TECHNIQUE: Multidetector CT imaging of the head and cervical spine was  performed following the standard protocol without intravenous contrast. Multiplanar CT image reconstructions of the cervical spine were also generated. COMPARISON:  Head CT 07/30/2013 FINDINGS: CT HEAD FINDINGS Brain: Ventricles, cisterns and other CSF spaces are within normal. There is chronic ischemic microvascular disease. There is no mass, mass effect, shift of midline structures or acute hemorrhage. Small old cerebellar infarcts. Old lacune infarct over the right thalamus. No acute infarction. Vascular: Calcified plaque over the cavernous segment of the internal carotid arteries and vertebral arteries. Skull: Within normal. Sinuses/Orbits: Within normal. Other: None. CT CERVICAL SPINE FINDINGS Alignment: Within normal. Skull base and vertebrae: Mild spondylosis throughout the cervical spine. Mild uncovertebral joint spurring is present as well as mild facet arthropathy. Mild bilateral neural foraminal narrowing at multiple levels due to adjacent bony spurring. No acute fracture. Soft tissues and spinal canal: Prevertebral soft tissues are within normal. Spinal canal within normal. Disc levels: Disc space narrowing at the C6-7 level greater than the C5-6 level. Upper chest: Within normal. Other: None. IMPRESSION: No acute intracranial findings. Chronic ischemic microvascular disease. Old lacune infarct over the right thalamus and small old cerebellar infarcts. No acute cervical spine injury. Mild spondylosis of the cervical spine with disc disease at the C5-6 and C6-7 levels. Bilateral neural foramina narrowing at multiple levels due to adjacent bony spurring. Electronically Signed   By: Marin Olp M.D.  On: 09/07/2016 22:20   US Abdomen Complete  Result Date: 09/07/2016 CLINICAL DATA:  Elevated LFTs.  Nausea today.  Lactic acidosis. EXAM: ABDOMEN ULTRASOUND COMPLETE COMPARISON:  Abdominal CT 05/24/2010 FINDINGS: Gallbladder: Surgically absent. Common bile duct: Diameter: 4 mm, normal. Liver: No focal  lesion identified. Diffusely increased and heterogeneous in parenchymal echogenicity. Elongated left lobe extending across the midline, as seen on prior CT. Normal directional flow in the imaged portal vein. IVC: No abnormality visualized. Pancreas: Not well evaluated sonographically. Spleen: No focal abnormality. Upper limits of normal in size spanning 12.9 cm. Right Kidney: Length: 14.0 cm. Echogenicity within normal limits. There is thinning of renal parenchyma. No mass or hydronephrosis visualized. Cysts on prior CT are not well visualized sonographically. Left Kidney: Length: 13.9 cm. Echogenicity within normal limits. There is thinning of renal parenchyma. No mass or hydronephrosis visualized. No shadowing calculi. Abdominal aorta: No aneurysm visualized. Mid and distal aorta are not visualized. Other findings: None.  No ascites. IMPRESSION: 1. Hepatic steatosis and probable hepatomegaly.  No focal lesion. 2. Postcholecystectomy without biliary dilatation. 3. Thinning of bilateral renal parenchyma, no hydronephrosis. 4. Midline structures including pancreas and abdominal aorta are not well visualized sonographically due to habitus and bowel gas. Electronically Signed   By: Jeb Levering M.D.   On: 09/07/2016 23:25     Medical Consultants:    None.  Anti-Infectives:   Vanco and cefepime.  Subjective:    Nicki [place] he close to baseline.  Objective:    Vitals:   09/08/16 2255 09/09/16 0028 09/09/16 0400 09/09/16 0430  BP:    (!) 112/58  Pulse:    99  Resp:    20  Temp: 100.2 F (37.9 C) 98.7 F (37.1 C)  98.1 F (36.7 C)  TempSrc: Oral Oral  Oral  SpO2:    96%  Weight:   135.2 kg (298 lb)   Height:        Intake/Output Summary (Last 24 hours) at 09/09/16 1033 Last data filed at 09/09/16 0947  Gross per 24 hour  Intake          1256.25 ml  Output             1250 ml  Net             6.25 ml   Filed Weights   09/07/16 2227 09/08/16 0148 09/09/16 0400  Weight:  130.4 kg (287 lb 7.7 oz) 132.1 kg (291 lb 4.8 oz) 135.2 kg (298 lb)    Exam: General exam: In no acute distress, morbidly obese Respiratory system: Good air movement and clear to auscultation. Cardiovascular system: S1 & S2 heard, RRR. No JVD. Gastrointestinal system: Abdomen is nondistended, soft and nontender.  Extremities: No pedal edema. Skin: No rashes, lesions or ulcers Psychiatry: Judgement and insight appear normal. Mood & affect appropriate.    Data Reviewed:    Labs: Basic Metabolic Panel:  Recent Labs Lab 09/07/16 2124  NA 139  K 3.5  CL 99*  CO2 26  GLUCOSE 280*  BUN 27*  CREATININE 1.63*  CALCIUM 8.9   GFR Estimated Creatinine Clearance: 56.7 mL/min (by C-G formula based on SCr of 1.63 mg/dL (H)). Liver Function Tests:  Recent Labs Lab 09/07/16 2124  AST 200*  ALT 98*  ALKPHOS 44  BILITOT 1.0  PROT 6.9  ALBUMIN 3.3*   No results for input(s): LIPASE, AMYLASE in the last 168 hours. No results for input(s): AMMONIA in the last 168 hours. Coagulation profile  No results for input(s): INR, PROTIME in the last 168 hours.  CBC:  Recent Labs Lab 09/07/16 2124  WBC 6.9  NEUTROABS 5.5  HGB 12.3*  HCT 37.1*  MCV 92.3  PLT 165   Cardiac Enzymes:  Recent Labs Lab 09/07/16 2226 09/08/16 0107 09/08/16 0657  TROPONINI 0.04* 0.04* 0.05*   BNP (last 3 results) No results for input(s): PROBNP in the last 8760 hours. CBG:  Recent Labs Lab 09/08/16 0558 09/08/16 1137 09/08/16 1657 09/08/16 2116 09/09/16 0602  GLUCAP 178* 146* 198* 173* 151*   D-Dimer: No results for input(s): DDIMER in the last 72 hours. Hgb A1c:  Recent Labs  09/08/16 0107  HGBA1C 10.1*   Lipid Profile: No results for input(s): CHOL, HDL, LDLCALC, TRIG, CHOLHDL, LDLDIRECT in the last 72 hours. Thyroid function studies: No results for input(s): TSH, T4TOTAL, T3FREE, THYROIDAB in the last 72 hours.  Invalid input(s): FREET3 Anemia work up: No results for  input(s): VITAMINB12, FOLATE, FERRITIN, TIBC, IRON, RETICCTPCT in the last 72 hours. Sepsis Labs:  Recent Labs Lab 09/07/16 2124 09/07/16 2139 09/08/16 0106 09/08/16 0107 09/08/16 0305 09/08/16 0657  PROCALCITON  --   --   --  0.31  --   --   WBC 6.9  --   --   --   --   --   LATICACIDVEN  --  4.92* 2.4*  --  3.5* 2.4*   Microbiology Recent Results (from the past 240 hour(s))  Blood Culture (routine x 2)     Status: None (Preliminary result)   Collection Time: 09/07/16 10:04 PM  Result Value Ref Range Status   Specimen Description BLOOD RIGHT ARM  Final   Special Requests IN PEDIATRIC BOTTLE 4CC  Final   Culture NO GROWTH < 24 HOURS  Final   Report Status PENDING  Incomplete  Blood Culture (routine x 2)     Status: None (Preliminary result)   Collection Time: 09/07/16 10:08 PM  Result Value Ref Range Status   Specimen Description BLOOD LEFT HAND  Final   Special Requests BOTTLES DRAWN AEROBIC AND ANAEROBIC 5CC EA  Final   Culture NO GROWTH < 24 HOURS  Final   Report Status PENDING  Incomplete  Urine culture     Status: None   Collection Time: 09/07/16 11:52 PM  Result Value Ref Range Status   Specimen Description URINE, CLEAN CATCH  Final   Special Requests NONE  Final   Culture NO GROWTH  Final   Report Status 09/09/2016 FINAL  Final     Medications:   . apixaban  5 mg Oral BID  . ceFEPime (MAXIPIME) IV  2 g Intravenous Q24H  . diltiazem  360 mg Oral Daily  . escitalopram  10 mg Oral QHS  . insulin aspart  0-15 Units Subcutaneous TID WC  . insulin aspart  0-5 Units Subcutaneous QHS  . insulin aspart  4 Units Subcutaneous TID WC  . insulin detemir  20 Units Subcutaneous QHS  . mouth rinse  15 mL Mouth Rinse BID  . oseltamivir  30 mg Oral BID  . pantoprazole  40 mg Oral Daily  . simvastatin  20 mg Oral Q M,W,F-1800  . sodium chloride flush  3 mL Intravenous Q12H  . tamsulosin  0.4 mg Oral QPC breakfast  . vancomycin  1,500 mg Intravenous Q24H   Continuous  Infusions:  Time spent: 25 min   LOS: 1 day   Charlynne Cousins  [facility] Pager [phone]  *  Please refer to [email or web address], password [email or web address] to get updated schedule on who will round on this patient, as hospitalists switch teams weekly. If 7PM-7AM, please contact night-coverage at [email or web address], password [email or web address] for any overnight needs.  09/09/2016, 10:33 AM

## 2016-09-10 DIAGNOSIS — J101 Influenza due to other identified influenza virus with other respiratory manifestations: Secondary | ICD-10-CM

## 2016-09-10 LAB — BASIC METABOLIC PANEL
Anion gap: 6 (ref 5–15)
BUN: 20 mg/dL (ref 6–20)
CALCIUM: 8.1 mg/dL — AB (ref 8.9–10.3)
CO2: 27 mmol/L (ref 22–32)
Chloride: 105 mmol/L (ref 101–111)
Creatinine, Ser: 1.21 mg/dL (ref 0.61–1.24)
GFR calc Af Amer: >60 mL/min (ref >60)
GFR, EST NON AFRICAN AMERICAN: 58 mL/min — AB (ref >60)
GLUCOSE: 171 mg/dL — AB (ref 65–99)
Potassium: 3.5 mmol/L (ref 3.5–5.1)
Sodium: 138 mmol/L (ref 135–145)

## 2016-09-10 LAB — GLUCOSE, CAPILLARY
GLUCOSE-CAPILLARY: 172 mg/dL — AB (ref 65–99)
GLUCOSE-CAPILLARY: 210 mg/dL — AB (ref 65–99)

## 2016-09-10 MED ORDER — OSELTAMIVIR PHOSPHATE 75 MG PO CAPS: 75.0000 mg | ORAL_CAPSULE | Freq: Every day | ORAL | 0 refills | Status: DC

## 2016-09-10 MED ORDER — OSELTAMIVIR PHOSPHATE 30 MG PO CAPS: 30.0000 mg | ORAL_CAPSULE | Freq: Two times a day (BID) | ORAL | 0 refills | Status: DC

## 2016-09-10 MED ORDER — GUAIFENESIN-DM 100-10 MG/5ML PO SYRP: 5.0000 mL | ORAL_SOLUTION | ORAL | 0 refills | Status: DC | PRN

## 2016-09-10 NOTE — Discharge Summary (Signed)
Physician Discharge Summary  Oscar [name]:[id number] DOB: 10-27-1945 DOA: 09/07/2016  PCP: Henrine Screws, MD  Admit date: 09/07/2016 Discharge date: 09/10/2016  Recommendations for Outpatient Follow-up:  1. Pt will need to follow up with PCP in 1-2 weeks post discharge 2. Please obtain BMP to evaluate electrolytes and kidney function 3. Please also check CBC to evaluate Hg and Hct levels 4. Pt to complete therapy with Tamiflu   Discharge Diagnoses:  Principal Problem:   Influenza A Active Problems:   Diabetes mellitus type 2, uncontrolled, with complications ([place])   CKD (chronic kidney disease), stage II   Atrial fibrillation (HCC)   Hypertension   Chronic diastolic heart failure (HCC)   OSA (obstructive sleep apnea)   AKI (acute kidney injury) ([place])   Acute encephalopathy   Acute respiratory failure with hypoxia (HCC)   Elevated lactic acid level   Elevated troponin  Discharge Condition: Stable  Diet recommendation: Heart healthy diet discussed in details    Brief Narrative:   71 y.o. male past medical history of chronic atrial fibrillation on Eluquis, chronic diastolic heart failure, insulin-dependent diabetes mellitus with a last A1c 2 years ago of 9.1, who presents to the ED with lethargy, malaise, some family members had a respiratory tract infection, he has felt nauseated and vomited once nonbloody. In the ED he was found to be satting 87% on room air mildly tachycardic with stable blood pressure.  Assessment/Plan:   Acute respiratory failure with hypoxia due to influenza A/SIRS: Influenza A positive started empirically on Tamiflu, chest x-ray did not show any infiltrates. Started empirically on admission on IV vancomycin and Zosyn due to an elevated lactate D/c empiric abx as pt with further fevers, only continue Tamiflu   Chronic renal disease stage III: Baseline creatinine of 1. 3- 1.5. Continue Lasix per home medical regimen   Mild elevation in  troponins: He denies any angina symptoms, cardiac biomarkers have been flat. No ischemic changes on EKG  Chronic atrial fibrillation: CHADS-VASc is 61 (age, CAD, HTN, DM, CHF) Continue Eliquis, diltiazem.  Chronic diastolic CHF  - Pt appears dry on admission in setting of sepsis  - Resumed lasix ans metolazone. - TTE (06/12/15) with EF 55-60%, mild MR.  Diabetes mellitus type 2, uncontrolled, with complications (HCC) Last A1c was 9.1. Resume home medical regimen  Stopped Glyburide to avoid hypoglycemic events.   Essential Hypertension Continue hold antihypertensive medication can give diltiazem.  OSA (obstructive sleep apnea) Cont c-pap per home medical regimen   Acute encephalopathy Now back to baseline.  DVT prophylaxis: lovenox Family Communication:none Disposition Plan/Barrier to D/C: home  Code Status: Full   Procedures/Studies: Dg Chest 2 View  Result Date: 09/07/2016 CLINICAL DATA:  Fall today with dizziness and lightheadedness. EXAM: CHEST  2 VIEW COMPARISON:  06/10/2010 FINDINGS: Lungs are adequately inflated without focal airspace consolidation or effusion. There is mild stable cardiomegaly. The posterior mid to upper lungs are cut off the lateral film. There mild degenerate changes of the spine. IMPRESSION: No active cardiopulmonary disease. Mild cardiomegaly. Electronically Signed   By: Marin Olp M.D.   On: 09/07/2016 21:59   Ct Head Wo Contrast  Result Date: 09/07/2016 CLINICAL DATA:  Fall in bathroom heating head into tub. Some neck pain. EXAM: CT HEAD WITHOUT CONTRAST CT CERVICAL SPINE WITHOUT CONTRAST TECHNIQUE: Multidetector CT imaging of the head and cervical spine was performed following the standard protocol without intravenous contrast. Multiplanar CT image reconstructions of the cervical spine were also generated. COMPARISON:  Head  CT 07/30/2013 FINDINGS: CT HEAD FINDINGS Brain: Ventricles, cisterns and other CSF spaces are within normal. There is  chronic ischemic microvascular disease. There is no mass, mass effect, shift of midline structures or acute hemorrhage. Small old cerebellar infarcts. Old lacune infarct over the right thalamus. No acute infarction. Vascular: Calcified plaque over the cavernous segment of the internal carotid arteries and vertebral arteries. Skull: Within normal. Sinuses/Orbits: Within normal. Other: None. CT CERVICAL SPINE FINDINGS Alignment: Within normal. Skull base and vertebrae: Mild spondylosis throughout the cervical spine. Mild uncovertebral joint spurring is present as well as mild facet arthropathy. Mild bilateral neural foraminal narrowing at multiple levels due to adjacent bony spurring. No acute fracture. Soft tissues and spinal canal: Prevertebral soft tissues are within normal. Spinal canal within normal. Disc levels: Disc space narrowing at the C6-7 level greater than the C5-6 level. Upper chest: Within normal. Other: None. IMPRESSION: No acute intracranial findings. Chronic ischemic microvascular disease. Old lacune infarct over the right thalamus and small old cerebellar infarcts. No acute cervical spine injury. Mild spondylosis of the cervical spine with disc disease at the C5-6 and C6-7 levels. Bilateral neural foramina narrowing at multiple levels due to adjacent bony spurring. Electronically Signed   By: Elberta Fortis M.D.   On: 09/07/2016 22:20   Ct Cervical Spine Wo Contrast  Result Date: 09/07/2016 CLINICAL DATA:  Fall in bathroom heating head into tub. Some neck pain. EXAM: CT HEAD WITHOUT CONTRAST CT CERVICAL SPINE WITHOUT CONTRAST TECHNIQUE: Multidetector CT imaging of the head and cervical spine was performed following the standard protocol without intravenous contrast. Multiplanar CT image reconstructions of the cervical spine were also generated. COMPARISON:  Head CT 07/30/2013 FINDINGS: CT HEAD FINDINGS Brain: Ventricles, cisterns and other CSF spaces are within normal. There is chronic ischemic  microvascular disease. There is no mass, mass effect, shift of midline structures or acute hemorrhage. Small old cerebellar infarcts. Old lacune infarct over the right thalamus. No acute infarction. Vascular: Calcified plaque over the cavernous segment of the internal carotid arteries and vertebral arteries. Skull: Within normal. Sinuses/Orbits: Within normal. Other: None. CT CERVICAL SPINE FINDINGS Alignment: Within normal. Skull base and vertebrae: Mild spondylosis throughout the cervical spine. Mild uncovertebral joint spurring is present as well as mild facet arthropathy. Mild bilateral neural foraminal narrowing at multiple levels due to adjacent bony spurring. No acute fracture. Soft tissues and spinal canal: Prevertebral soft tissues are within normal. Spinal canal within normal. Disc levels: Disc space narrowing at the C6-7 level greater than the C5-6 level. Upper chest: Within normal. Other: None. IMPRESSION: No acute intracranial findings. Chronic ischemic microvascular disease. Old lacune infarct over the right thalamus and small old cerebellar infarcts. No acute cervical spine injury. Mild spondylosis of the cervical spine with disc disease at the C5-6 and C6-7 levels. Bilateral neural foramina narrowing at multiple levels due to adjacent bony spurring. Electronically Signed   By: Elberta Fortis M.D.   On: 09/07/2016 22:20   US Abdomen Complete  Result Date: 09/07/2016 CLINICAL DATA:  Elevated LFTs.  Nausea today.  Lactic acidosis. EXAM: ABDOMEN ULTRASOUND COMPLETE COMPARISON:  Abdominal CT 05/24/2010 FINDINGS: Gallbladder: Surgically absent. Common bile duct: Diameter: 4 mm, normal. Liver: No focal lesion identified. Diffusely increased and heterogeneous in parenchymal echogenicity. Elongated left lobe extending across the midline, as seen on prior CT. Normal directional flow in the imaged portal vein. IVC: No abnormality visualized. Pancreas: Not well evaluated sonographically. Spleen: No focal  abnormality. Upper limits of normal in size spanning  12.9 cm. Right Kidney: Length: 14.0 cm. Echogenicity within normal limits. There is thinning of renal parenchyma. No mass or hydronephrosis visualized. Cysts on prior CT are not well visualized sonographically. Left Kidney: Length: 13.9 cm. Echogenicity within normal limits. There is thinning of renal parenchyma. No mass or hydronephrosis visualized. No shadowing calculi. Abdominal aorta: No aneurysm visualized. Mid and distal aorta are not visualized. Other findings: None.  No ascites. IMPRESSION: 1. Hepatic steatosis and probable hepatomegaly.  No focal lesion. 2. Postcholecystectomy without biliary dilatation. 3. Thinning of bilateral renal parenchyma, no hydronephrosis. 4. Midline structures including pancreas and abdominal aorta are not well visualized sonographically due to habitus and bowel gas. Electronically Signed   By: Jeb Levering M.D.   On: 09/07/2016 23:25     Discharge Exam: Vitals:   09/10/16 0032 09/10/16 0634  BP: 119/70 115/73  Pulse: 78 83  Resp: 18 18  Temp: 98.2 F (36.8 C) 98.2 F (36.8 C)   Vitals:   09/09/16 1124 09/09/16 1944 09/10/16 0032 09/10/16 0634  BP: 127/80 (!) 118/92 119/70 115/73  Pulse: 86 86 78 83  Resp: '18 18 18 18  '$ Temp: 97.6 F (36.4 C) 98.1 F (36.7 C) 98.2 F (36.8 C) 98.2 F (36.8 C)  TempSrc: Oral Oral Oral Oral  SpO2: 96% 100% 98% 96%  Weight:    135.7 kg (299 lb 3.2 oz)  Height:        General: Pt is alert, follows commands appropriately, not in acute distress Cardiovascular: Regular rate and rhythm, S1/S2 +, no rubs, no gallops Respiratory: Clear to auscultation bilaterally, no wheezing, no crackles, no rhonchi Abdominal: Soft, non tender, non distended, bowel sounds +, no guarding  Discharge Instructions  Discharge Instructions    Diet - low sodium heart healthy    Complete by:  As directed    Increase activity slowly    Complete by:  As directed      Allergies as of  09/10/2016      Reactions   Iohexol     Desc: HIVES S/P 13HR.PREMEDS, ?LOW DOSAGE PREMEDS   Ivp Dye [iodinated Diagnostic Agents]    welps      Medication List    STOP taking these medications   glyBURIDE 5 MG tablet Commonly known as:  DIABETA     TAKE these medications   acetaminophen 500 MG tablet Commonly known as:  TYLENOL Take 1,500 mg by mouth every 6 (six) hours as needed for moderate pain. Reported on 10/23/2015   allopurinol 300 MG tablet Commonly known as:  ZYLOPRIM Take 300 mg by mouth 3 (three) times daily as needed. Reported on 12/27/2015   blood glucose meter kit and supplies Kit Dispense based on patient and insurance preference. Use up to four times daily as directed. (FOR ICD-9 250.00, 250.01).   diltiazem 360 MG 24 hr capsule Commonly known as:  CARDIZEM CD Take 1 capsule (360 mg total) by mouth daily.   ELIQUIS 5 MG Tabs tablet Generic drug:  apixaban TAKE 1 TABLET BY MOUTH TWICE DAILY   escitalopram 10 MG tablet Commonly known as:  LEXAPRO Take 10 mg by mouth at bedtime.   furosemide 80 MG tablet Commonly known as:  LASIX Take 1 tablet (80 mg total) by mouth daily. T   guaiFENesin-dextromethorphan 100-10 MG/5ML syrup Commonly known as:  ROBITUSSIN DM Take 5 mLs by mouth every 4 (four) hours as needed for cough.   indomethacin 50 MG capsule Commonly known as:  INDOCIN Take 50 mg  by mouth 3 (three) times daily as needed (for gout flare ups). Reported on 12/27/2015   insulin aspart 100 UNIT/ML injection Commonly known as:  novoLOG Inject 0-9 Units into the skin 3 (three) times daily as needed for high blood sugar.   LEVEMIR 100 UNIT/ML injection Generic drug:  insulin detemir Inject 20 Units into the skin at bedtime.   metformin 1000 MG (OSM) 24 hr tablet Commonly known as:  FORTAMET Take 1,000 mg by mouth 2 (two) times daily.   metolazone 2.5 MG tablet Commonly known as:  ZAROXOLYN Take 1 tablet every Wednesday.   oseltamivir 30 MG  capsule Commonly known as:  TAMIFLU Take 1 capsule (30 mg total) by mouth 2 (two) times daily.   pantoprazole 40 MG tablet Commonly known as:  PROTONIX Take 1 tablet (40 mg total) by mouth daily.   potassium chloride SA 20 MEQ tablet Commonly known as:  K-DUR,KLOR-CON Take 2 tablets (40 mEq total) by mouth daily.   simvastatin 20 MG tablet Commonly known as:  ZOCOR Take one (1) tablet (20 mg total) by mouth on Mondays, Wednesdays, and Fridays.   tamsulosin 0.4 MG Caps capsule Commonly known as:  FLOMAX Take 1 capsule (0.4 mg total) by mouth daily after breakfast.   ULORIC 80 MG Tabs Generic drug:  Febuxostat Take 80 mg by mouth daily.      Follow-up Information    GATES,ROBERT NEVILL, MD Follow up.   Specialty:  Internal Medicine Contact information: [address]  [place] [address] [phone]        [name], MD Follow up.   Specialty:  Internal Medicine Why:  call my cell phone [phone] with questions please  Contact information: [address] [place] [address] [phone]          The results of significant diagnostics from this hospitalization (including imaging, microbiology, ancillary and laboratory) are listed below for reference.    Microbiology: Recent Results (from the past 240 hour(s))  Blood Culture (routine x 2)     Status: None (Preliminary result)   Collection Time: 09/07/16 10:04 PM  Result Value Ref Range Status   Specimen Description BLOOD RIGHT ARM  Final   Special Requests IN PEDIATRIC BOTTLE 4CC  Final   Culture NO GROWTH 2 DAYS  Final   Report Status PENDING  Incomplete  Blood Culture (routine x 2)     Status: None (Preliminary result)   Collection Time: 09/07/16 10:08 PM  Result Value Ref Range Status   Specimen Description BLOOD LEFT HAND  Final   Special Requests BOTTLES DRAWN AEROBIC AND ANAEROBIC 5CC EA  Final   Culture NO GROWTH 2 DAYS  Final   Report Status PENDING   Incomplete  Urine culture     Status: None   Collection Time: 09/07/16 11:52 PM  Result Value Ref Range Status   Specimen Description URINE, CLEAN CATCH  Final   Special Requests NONE  Final   Culture NO GROWTH  Final   Report Status 09/09/2016 FINAL  Final     Labs: Basic Metabolic Panel:  Recent Labs Lab 09/07/16 2124 09/09/16 1249 09/10/16 0458  NA 139 138 138  K 3.5 3.6 3.5  CL 99* 104 105  CO2 '26 24 27  '$ GLUCOSE 280* 233* 171*  BUN 27* 18 20  CREATININE 1.63* 1.14 1.21  CALCIUM 8.9 8.2* 8.1*   Liver Function Tests:  Recent Labs Lab 09/07/16 2124  [name] 200*  ALT 98*  ALKPHOS 44  BILITOT 1.0  PROT 6.9  ALBUMIN 3.3*   CBC:  Recent Labs Lab 09/07/16 2124  WBC 6.9  NEUTROABS 5.5  HGB 12.3*  HCT 37.1*  MCV 92.3  PLT 165   Cardiac Enzymes:  Recent Labs Lab 09/07/16 2226 09/08/16 0107 09/08/16 0657  TROPONINI 0.04* 0.04* 0.05*   BNP: BNP (last 3 results)  Recent Labs  09/07/16 2124  BNP 100.7*   CBG:  Recent Labs Lab 09/09/16 0602 09/09/16 1123 09/09/16 1703 09/09/16 2049 09/10/16 0602  GLUCAP 151* 189* 199* 214* 172*   SIGNED: Time coordinating discharge: 30 minutes  MAGICK-MYERS, ISKRA, MD  [facility] 09/10/2016, 10:34 AM Pager [phone]  If 7PM-7AM, please contact night-coverage [email or web address] Password [email or web address]

## 2016-09-10 NOTE — Progress Notes (Signed)
Patient alert and oriented, denies pain. Iv d/c.d/c instruction explain and given to the patient, patient verbalized understanding, all questions answered. G/c pt home per order

## 2016-09-10 NOTE — Evaluation (Signed)
Physical Therapy Evaluation Patient Details Name: Oscar [name] MRN: [id number] DOB: 05-15-1946 Today's Date: 09/10/2016   History of Present Illness  Oscar [name] is an 71 y.o. male past medical history of chronic atrial fibrillation on Eluquis, chronic diastolic heart failure, insulin-dependent diabetes mellitus with a last A1c 2 years ago of 9.1, who presents to the ED with lethargy, malaise, some family members had a respiratory tract infection, he has felt nauseated and vomited once nonbloody. In the ED he was found to be satting 87% on room air mildly tachycardic with stable blood pressure. Pt tested positive for influenza.  Clinical Impression  Pt moving well with only complaint being L charley horse in thigh.  Educated on managing stairs with pain level. o2 sat 100% on room air at beginning of gait and decreased to 93%.  No skilled PT needs identified and will sign off.    Follow Up Recommendations No PT follow up    Equipment Recommendations  None recommended by PT    Recommendations for Other Services       Precautions / Restrictions Precautions Precautions: None Restrictions Weight Bearing Restrictions: No      Mobility  Bed Mobility               General bed mobility comments: up in chair upon arrival  Transfers Overall transfer level: Modified independent                  Ambulation/Gait Ambulation/Gait assistance: Modified independent (Device/Increase time) Ambulation Distance (Feet): 150 Feet Assistive device: None Gait Pattern/deviations: Step-through pattern     General Gait Details: o2 100% at beginning of gait and went down to 93% as gait progressed  Stairs Stairs: Yes Stairs assistance: Supervision Stair Management: Two rails Number of Stairs: 3 General stair comments: Pt c/o L lateral thigh "charley horse"  Wheelchair Mobility    Modified Rankin (Stroke Patients Only)       Balance Overall balance assessment: History of  Falls                                           Pertinent Vitals/Pain Pain Assessment: 0-10 Pain Location: L thigh "Charley horse" Pain Descriptors / Indicators: Grimacing Pain Intervention(s): Limited activity within patient's tolerance;Heat applied;Monitored during session    [place] expects to be discharged to:: Private residence Living Arrangements: Spouse/significant other Available Help at Discharge: Family;Available 24 hours/day Type of Home: House Home Access: Stairs to enter Entrance Stairs-Rails: Psychiatric nurse of Steps: 4 Home Layout: Two level;Able to live on main level with bedroom/bathroom Home Equipment: Gilford Rile - 2 wheels      Prior Function Level of Independence: Independent               Hand Dominance        Extremity/Trunk Assessment   Upper Extremity Assessment Upper Extremity Assessment: Overall WFL for tasks assessed    Lower Extremity Assessment Lower Extremity Assessment: Overall WFL for tasks assessed    Cervical / Trunk Assessment Cervical / Trunk Assessment: Normal  Communication   Communication: No difficulties  Cognition Arousal/Alertness: Awake/alert Behavior During Therapy: WFL for tasks assessed/performed Overall Cognitive Status: Within Functional Limits for tasks assessed                      General Comments General comments (skin integrity, edema, etc.): Pt  reports falling over the weekend in bathroom, he attributes this to being sick.    Exercises     Assessment/Plan    PT Assessment Patent does not need any further PT services  PT Problem List            PT Treatment Interventions      PT Goals (Current goals can be found in the Care Plan section)  Acute Rehab PT Goals Patient Stated Goal: go home PT Goal Formulation: All assessment and education complete, DC therapy    Frequency     Barriers to discharge        Co-evaluation                End of Session   Activity Tolerance: Patient tolerated treatment well Patient left: in chair;with call bell/phone within reach;with nursing/sitter in room Nurse Communication: Mobility status         Time: [id number] PT Time Calculation (min) (ACUTE ONLY): 18 min   Charges:   PT Evaluation $PT Eval Low Complexity: 1 Procedure     PT G Codes:        Pier Laux LUBECK 09/10/2016, 11:10 AM

## 2016-09-12 LAB — CULTURE, BLOOD (ROUTINE X 2)
CULTURE: NO GROWTH
Culture: NO GROWTH

## 2016-09-18 DIAGNOSIS — R0902 Hypoxemia: Secondary | ICD-10-CM | POA: Diagnosis not present

## 2016-09-18 DIAGNOSIS — R6883 Chills (without fever): Secondary | ICD-10-CM | POA: Diagnosis not present

## 2016-09-18 DIAGNOSIS — E1165 Type 2 diabetes mellitus with hyperglycemia: Secondary | ICD-10-CM | POA: Diagnosis not present

## 2016-09-18 DIAGNOSIS — Z7984 Long term (current) use of oral hypoglycemic drugs: Secondary | ICD-10-CM | POA: Diagnosis not present

## 2016-09-18 DIAGNOSIS — J111 Influenza due to unidentified influenza virus with other respiratory manifestations: Secondary | ICD-10-CM | POA: Diagnosis not present

## 2016-10-07 ENCOUNTER — Encounter (INDEPENDENT_AMBULATORY_CARE_PROVIDER_SITE_OTHER): Payer: Self-pay | Admitting: Orthopedic Surgery

## 2016-10-07 ENCOUNTER — Ambulatory Visit (INDEPENDENT_AMBULATORY_CARE_PROVIDER_SITE_OTHER): Payer: Medicare Other | Admitting: Orthopedic Surgery

## 2016-10-07 DIAGNOSIS — L97411 Non-pressure chronic ulcer of right heel and midfoot limited to breakdown of skin: Secondary | ICD-10-CM | POA: Diagnosis not present

## 2016-10-07 MED ORDER — MUPIROCIN 2 % EX OINT: 1.0000 "application " | TOPICAL_OINTMENT | Freq: Two times a day (BID) | CUTANEOUS | 3 refills | Status: DC

## 2016-10-07 NOTE — Progress Notes (Signed)
Office Visit Note   Patient: Oscar [name]           Date of Birth: Jan 01, 1946           MRN: [id number] Visit Date: 10/07/2016              Requested by: Josetta Huddle, MD [address] [place], [place] [address] PCP: Henrine Screws, MD  Chief Complaint  Patient presents with  . Left Foot - Wound Check    HPI: Patient is a 71 y.o male who presents today for left heel ulceration. He has noticed a skin crack at posterior aspect of left heel for approximately 1-2 months. He has been managing this at home, cleaning daily, and applying neosporin dressing. He is status post left excision necrotic achilles skin graft from 09/15/2014, and is worrisome of possible infection. He recently had the flu and did have fever and chills at that time and ended up being hospitalized approximately 2-3 weeks ago. Maxcine Ham, RT    Assessment & Plan: Visit Diagnoses:  1. Non-pressure chronic ulcer of right heel and midfoot limited to breakdown of skin ([place])     Plan: Patient was given instructions to get a pair of the knee-high 15-20 mm of compression compression stockings he is to wear this around the clock. A prescription for Bactroban will be called in from to apply to the wound daily.  Follow-Up Instructions: Return in about 3 weeks (around 10/28/2016).   Ortho Exam On examination patient is alert oriented no adenopathy well-dressed normal affect normal respiratory effort he does have an antalgic gait. He is a good dorsalis pedis and posterior tibial pulse in the right there is significant venous stasis insufficiency with pitting edema up to the tibial tubercle. There is brawny skin color changes but no venous ulcers. He has dry cracked skin on the plantar aspect of his foot and a dry crack ulcer on the posterior aspect of the heel that goes down to healthy granulation tissue this is 3 mm wide and 15 mm in length and 1 mm deep. There is no surrounding cellulitis no drainage no  odor no signs of localized cellulitis or abscess.  Imaging: No results found.  Orders:  No orders of the defined types were placed in this encounter.  Meds ordered this encounter  Medications  . mupirocin ointment (BACTROBAN) 2 %    Sig: Apply 1 application topically 2 (two) times daily. Apply to the affected area 2 times a day    Dispense:  22 g    Refill:  3     Procedures: No procedures performed  Clinical Data: No additional findings.  Subjective: Review of Systems  Objective: Vital Signs: There were no vitals taken for this visit.  Specialty Comments:  No specialty comments available.  PMFS History: Patient Active Problem List   Diagnosis Date Noted  . Non-pressure chronic ulcer of right heel and midfoot limited to breakdown of skin ([place]) 10/07/2016  . Influenza A   . AKI (acute kidney injury) ([place]) 09/07/2016  . Acute encephalopathy 09/07/2016  . Acute respiratory failure with hypoxia ([place]) 09/07/2016  . Elevated lactic acid level 09/07/2016  . Elevated troponin 09/07/2016  . Coronary artery calcification seen on CT scan 10/28/2015  . Acute on chronic diastolic (congestive) heart failure 09/26/2015  . OSA (obstructive sleep apnea) 02/07/2015  . Heel ulcer due to DM ([place]) 09/15/2014  . Sepsis ([place]) 08/28/2014  . Cellulitis 08/28/2014  . Chronic diastolic  heart failure ([place]) 09/21/2013  . CVA (cerebral infarction) 08/08/2013  . Dizziness 08/07/2013  . TIA (transient ischemic attack) 08/07/2013  . Atrial fibrillation ([place]) 08/07/2013  . Hypertension 08/07/2013  . Anemia 08/07/2013  . Dyspnea 06/21/2013  . Chest pain 11/16/2012  . Atrial fibrillation with RVR ([place]) 11/16/2012  . Gout 11/16/2012  . Diabetes mellitus type 2, uncontrolled, with complications ([place]) [id number]  . CKD (chronic kidney disease), stage II 11/16/2012  . Atypical atrial flutter ([place]) 11/16/2012  . Pleuritic chest pain 11/16/2012  . Morbid obesity ([place]) 11/16/2012  . Obstructive  sleep apnea 11/16/2012   Past Medical History:  Diagnosis Date  . Anal fissure    h/o - no recent complications  . Arthritis    hips  . Atrial fibrillation ([place])   . Diabetes mellitus without complication ([place])    improved after diet/exercise  . Enlarged prostate   . GERD (gastroesophageal reflux disease)   . Gout   . H/O hiatal hernia   . Hx of typhoid fever    as a child  . Hypertension    improved after diet/exercise  . Kidney stones   . OSA (obstructive sleep apnea)    wears cpap  . PONV (postoperative nausea and vomiting)    also difficulty waking up  . Stroke [facility]) 08/2013   short term memory loss (slight)    Family History  Problem Relation Age of Onset  . COPD Father   . Heart attack Sister     died age 30 of MI    Past Surgical History:  Procedure Laterality Date  . carpel tunnel Bilateral   . CHOLECYSTECTOMY    . COLONOSCOPY    . EYE SURGERY     laser surgery   . I&D EXTREMITY Left 09/15/2014   Procedure: Excision Necrotic Left Achilles, Skin Graft, apply Wound VAC;  Surgeon: Newt Minion, MD;  Location: [facility];  Service: Orthopedics;  Laterality: Left;  . KIDNEY STONE SURGERY    . KNEE ARTHROSCOPY Bilateral   . LIGAMENT REPAIR    . TONSILLECTOMY    . VASECTOMY     Social History   Occupational History  . retired    Social History Main Topics  . Smoking status: Never Smoker  . Smokeless tobacco: Never Used  . Alcohol use 0.0 oz/week     Comment: rare - 1-2x /yr  . Drug use: No  . Sexual activity: Not on file

## 2016-10-20 ENCOUNTER — Other Ambulatory Visit (HOSPITAL_COMMUNITY): Payer: Self-pay | Admitting: Cardiology

## 2016-10-20 MED ORDER — APIXABAN 5 MG PO TABS: 5.0000 mg | ORAL_TABLET | Freq: Two times a day (BID) | ORAL | 3 refills | Status: DC

## 2016-10-30 ENCOUNTER — Ambulatory Visit (INDEPENDENT_AMBULATORY_CARE_PROVIDER_SITE_OTHER): Payer: Medicare Other | Admitting: Orthopedic Surgery

## 2016-10-30 ENCOUNTER — Encounter (INDEPENDENT_AMBULATORY_CARE_PROVIDER_SITE_OTHER): Payer: Self-pay | Admitting: Orthopedic Surgery

## 2016-10-30 DIAGNOSIS — L97411 Non-pressure chronic ulcer of right heel and midfoot limited to breakdown of skin: Secondary | ICD-10-CM | POA: Diagnosis not present

## 2016-10-30 NOTE — Progress Notes (Signed)
Office Visit Note   Patient: Oscar [name]           Date of Birth: [birth year]           MRN: [id number] Visit Date: 10/30/2016              Requested by: Josetta Huddle, MD [address] [place], [place] [address] PCP: Henrine Screws, MD  Chief Complaint  Patient presents with  . Left Foot - Follow-up    3 wk rov, left heel ulcer    HPI: Patient states he is been doing Bactroban dressing changes he states the ulcer is getting better he is asymptomatic denies any drainage denies cellulitis. He states that his medial right heel ulcer is slowly improving.  Assessment & Plan: Visit Diagnoses:  1. Non-pressure chronic ulcer of right heel and midfoot limited to breakdown of skin ([place])     Plan: Continue with the medical compression socks continue with Bactroban over the wound.  Follow-Up Instructions: Return if symptoms worsen or fail to improve.   Ortho Exam  Patient is alert, oriented, no adenopathy, well-dressed, normal affect, normal respiratory effort. Patient does have an antalgic gait. Examination he has a good pulse there is no redness or cellulitis in his right foot the ulcer over the medial aspect of the right heel after informed consent was treated created with a 10 blade knife without complications. This does not probe to bone or tendon the wound is 2 mm x 15 mm and 1 mm deep.  Imaging: No results found.  Labs: Lab Results  Component Value Date   HGBA1C 10.1 (H) 09/08/2016   HGBA1C 9.1 (H) 08/28/2014   HGBA1C 10.1 (H) 08/07/2013   ESRSEDRATE 75 (H) 08/28/2014   ESRSEDRATE 42 (H) 11/17/2012   CRP 12.9 (H) 08/28/2014   LABURIC 3.0 (L) 08/31/2014   REPTSTATUS 09/09/2016 FINAL 09/07/2016   CULT NO GROWTH 09/07/2016    Orders:  No orders of the defined types were placed in this encounter.  No orders of the defined types were placed in this encounter.    Procedures: No procedures performed  Clinical Data: No additional  findings.  ROS: Review of Systems  Constitutional: Negative for chills and fever.  All other systems reviewed and are negative.   Objective: Vital Signs: There were no vitals taken for this visit.  Specialty Comments:  No specialty comments available.  PMFS History: Patient Active Problem List   Diagnosis Date Noted  . Non-pressure chronic ulcer of right heel and midfoot limited to breakdown of skin ([place]) 10/07/2016  . Influenza A   . AKI (acute kidney injury) ([place]) 09/07/2016  . Acute encephalopathy 09/07/2016  . Acute respiratory failure with hypoxia ([place]) 09/07/2016  . Elevated lactic acid level 09/07/2016  . Elevated troponin 09/07/2016  . Coronary artery calcification seen on CT scan 10/28/2015  . Acute on chronic diastolic (congestive) heart failure 09/26/2015  . OSA (obstructive sleep apnea) 02/07/2015  . Heel ulcer due to DM ([place]) 09/15/2014  . Sepsis ([place]) 08/28/2014  . Cellulitis 08/28/2014  . Chronic diastolic heart failure ([place]) 09/21/2013  . CVA (cerebral infarction) 08/08/2013  . Dizziness 08/07/2013  . TIA (transient ischemic attack) 08/07/2013  . Atrial fibrillation ([place]) 08/07/2013  . Hypertension 08/07/2013  . Anemia 08/07/2013  . Dyspnea 06/21/2013  . Chest pain 11/16/2012  . Atrial fibrillation with RVR ([place]) 11/16/2012  . Gout 11/16/2012  . Diabetes mellitus type 2, uncontrolled, with complications ([place]) 43/15/4008  . CKD (  chronic kidney disease), stage II 11/16/2012  . Atypical atrial flutter ([place]) 11/16/2012  . Pleuritic chest pain 11/16/2012  . Morbid obesity ([place]) 11/16/2012  . Obstructive sleep apnea 11/16/2012   Past Medical History:  Diagnosis Date  . Anal fissure    h/o - no recent complications  . Arthritis    hips  . Atrial fibrillation ([place])   . Diabetes mellitus without complication ([place])    improved after diet/exercise  . Enlarged prostate   . GERD (gastroesophageal reflux disease)   . Gout   . H/O hiatal hernia   . Hx of  typhoid fever    as a child  . Hypertension    improved after diet/exercise  . Kidney stones   . OSA (obstructive sleep apnea)    wears cpap  . PONV (postoperative nausea and vomiting)    also difficulty waking up  . Stroke [facility]) 08/2013   short term memory loss (slight)    Family History  Problem Relation Age of Onset  . COPD Father   . Heart attack Sister     died age 50 of MI    Past Surgical History:  Procedure Laterality Date  . carpel tunnel Bilateral   . CHOLECYSTECTOMY    . COLONOSCOPY    . EYE SURGERY     laser surgery   . I&D EXTREMITY Left 09/15/2014   Procedure: Excision Necrotic Left Achilles, Skin Graft, apply Wound VAC;  Surgeon: Newt Minion, MD;  Location: [facility];  Service: Orthopedics;  Laterality: Left;  . KIDNEY STONE SURGERY    . KNEE ARTHROSCOPY Bilateral   . LIGAMENT REPAIR    . TONSILLECTOMY    . VASECTOMY     Social History   Occupational History  . retired    Social History Main Topics  . Smoking status: Never Smoker  . Smokeless tobacco: Never Used  . Alcohol use 0.0 oz/week     Comment: rare - 1-2x /yr  . Drug use: No  . Sexual activity: Not on file

## 2016-11-07 ENCOUNTER — Other Ambulatory Visit (HOSPITAL_COMMUNITY): Payer: Self-pay | Admitting: Cardiology

## 2016-11-07 MED ORDER — METOLAZONE 2.5 MG PO TABS
ORAL_TABLET | ORAL | 3 refills | Status: DC
Start: 2016-11-07 — End: 2021-12-23

## 2016-11-19 ENCOUNTER — Ambulatory Visit (INDEPENDENT_AMBULATORY_CARE_PROVIDER_SITE_OTHER): Payer: Medicare Other | Admitting: Podiatry

## 2016-11-19 ENCOUNTER — Encounter: Payer: Self-pay | Admitting: Podiatry

## 2016-11-19 DIAGNOSIS — E1140 Type 2 diabetes mellitus with diabetic neuropathy, unspecified: Secondary | ICD-10-CM

## 2016-11-19 DIAGNOSIS — L97401 Non-pressure chronic ulcer of unspecified heel and midfoot limited to breakdown of skin: Secondary | ICD-10-CM

## 2016-11-19 DIAGNOSIS — E1149 Type 2 diabetes mellitus with other diabetic neurological complication: Secondary | ICD-10-CM

## 2016-11-20 NOTE — Progress Notes (Signed)
Subjective:     Patient ID: Oscar [name], male   DOB: [birth year], 71 y.o.   MRN: [id number]  HPI patient presents with chronic lesion sub-first metatarsal left that he admits he should've come in earlier and wants diabetic shoes due to ulceration and long-term diabetes but has not been able to get them up to this point   Review of Systems     Objective:   Physical Exam Significant diminishment of neurological sensation both sharp dull and vibratory with patient found to have a breakdown of tissue sub-first metatarsal left that measures approximately 1.5 cm x 1.5 cm that is superficial with no deep subcutaneous exposure noted    Assessment:     Ulceration left first metatarsal localized in nature with no proximal edema erythema or drainage noted with cracking of the right heel that is healed at the current time    Plan:     H&P condition reviewed and recommended debridement of the ulcer with application of Iodosorb padding and reduced weightbearing. Due to his ulceration and foot structure diabetic shoes are important for him as he is at risk and patient will be approved for these through family physician and then have them made

## 2016-12-03 DIAGNOSIS — E1165 Type 2 diabetes mellitus with hyperglycemia: Secondary | ICD-10-CM | POA: Diagnosis not present

## 2016-12-03 DIAGNOSIS — Z7984 Long term (current) use of oral hypoglycemic drugs: Secondary | ICD-10-CM | POA: Diagnosis not present

## 2016-12-03 DIAGNOSIS — E08621 Diabetes mellitus due to underlying condition with foot ulcer: Secondary | ICD-10-CM | POA: Diagnosis not present

## 2016-12-03 DIAGNOSIS — E559 Vitamin D deficiency, unspecified: Secondary | ICD-10-CM | POA: Diagnosis not present

## 2016-12-03 DIAGNOSIS — M109 Gout, unspecified: Secondary | ICD-10-CM | POA: Diagnosis not present

## 2016-12-03 DIAGNOSIS — G4733 Obstructive sleep apnea (adult) (pediatric): Secondary | ICD-10-CM | POA: Diagnosis not present

## 2016-12-03 DIAGNOSIS — Z6841 Body Mass Index (BMI) 40.0 and over, adult: Secondary | ICD-10-CM | POA: Diagnosis not present

## 2016-12-03 DIAGNOSIS — E0840 Diabetes mellitus due to underlying condition with diabetic neuropathy, unspecified: Secondary | ICD-10-CM | POA: Diagnosis not present

## 2016-12-03 DIAGNOSIS — E782 Mixed hyperlipidemia: Secondary | ICD-10-CM | POA: Diagnosis not present

## 2016-12-03 DIAGNOSIS — R0902 Hypoxemia: Secondary | ICD-10-CM | POA: Diagnosis not present

## 2016-12-03 DIAGNOSIS — I872 Venous insufficiency (chronic) (peripheral): Secondary | ICD-10-CM | POA: Diagnosis not present

## 2017-01-14 ENCOUNTER — Ambulatory Visit (INDEPENDENT_AMBULATORY_CARE_PROVIDER_SITE_OTHER): Payer: Medicare Other | Admitting: Podiatry

## 2017-01-14 ENCOUNTER — Encounter: Payer: Self-pay | Admitting: Podiatry

## 2017-01-14 VITALS — Resp 16

## 2017-01-14 DIAGNOSIS — B351 Tinea unguium: Secondary | ICD-10-CM

## 2017-01-14 DIAGNOSIS — M79605 Pain in left leg: Secondary | ICD-10-CM

## 2017-01-14 DIAGNOSIS — M79604 Pain in right leg: Secondary | ICD-10-CM

## 2017-01-14 DIAGNOSIS — M79676 Pain in unspecified toe(s): Secondary | ICD-10-CM | POA: Diagnosis not present

## 2017-01-14 DIAGNOSIS — L97401 Non-pressure chronic ulcer of unspecified heel and midfoot limited to breakdown of skin: Secondary | ICD-10-CM

## 2017-01-14 DIAGNOSIS — E1140 Type 2 diabetes mellitus with diabetic neuropathy, unspecified: Secondary | ICD-10-CM

## 2017-01-14 DIAGNOSIS — E1149 Type 2 diabetes mellitus with other diabetic neurological complication: Secondary | ICD-10-CM

## 2017-01-14 NOTE — Progress Notes (Signed)
Subjective:    Patient ID: Oscar [name], male   DOB: 71 y.o.   MRN: [id number]   HPI patient presents stating that it still gets tender underneath here and I should be getting diabetic shoes as I am not good control ago neurological disease and chronic area. Also complains of nailbeds 1-5 both feet    ROS      Objective:  Physical Exam vascular status mildly diminished but intact with patient noted to have diminished sharp [place] vibratory and DTR reflexes. Patient's found to have chronic keratotic lesion on the plantar aspect of the left first metatarsal that is mildly broken down and localized with no subcutaneous exposure and is noted to have nail disease 1-5 both feet with incurvation of the beds and pain     Assessment:    Chronic lesion formation plantar left consistent with possible ulceration along with mycotic nail infection with pain 1-5 both feet     Plan:    H&P and at risk condition with chronic ulceration left discussed with patient. Today I used in sterile sharp instrumentation I debrided the lesion left I flushed it and I packed it and it was bleeding quite a bit and applied sterile dressing. I then debrided nailbeds 1-5 both feet and we will get permission for diabetic shoes for this patient

## 2017-01-27 ENCOUNTER — Ambulatory Visit (HOSPITAL_COMMUNITY)
Admission: RE | Admit: 2017-01-27 | Discharge: 2017-01-27 | Disposition: A | Discharge status: Home / Self Care | Payer: Medicare Other | Source: Ambulatory Visit | Attending: Internal Medicine | Admitting: Internal Medicine

## 2017-01-27 ENCOUNTER — Encounter (HOSPITAL_COMMUNITY): Payer: Self-pay | Admitting: Internal Medicine

## 2017-01-27 VITALS — BP 130/76 | HR 73 | Wt 293.8 lb

## 2017-01-27 DIAGNOSIS — I110 Hypertensive heart disease with heart failure: Secondary | ICD-10-CM | POA: Insufficient documentation

## 2017-01-27 DIAGNOSIS — Z6841 Body Mass Index (BMI) 40.0 and over, adult: Secondary | ICD-10-CM | POA: Diagnosis not present

## 2017-01-27 DIAGNOSIS — I5032 Chronic diastolic (congestive) heart failure: Secondary | ICD-10-CM

## 2017-01-27 DIAGNOSIS — G4733 Obstructive sleep apnea (adult) (pediatric): Secondary | ICD-10-CM | POA: Diagnosis not present

## 2017-01-27 DIAGNOSIS — I5030 Unspecified diastolic (congestive) heart failure: Secondary | ICD-10-CM | POA: Insufficient documentation

## 2017-01-27 DIAGNOSIS — I482 Chronic atrial fibrillation: Secondary | ICD-10-CM | POA: Insufficient documentation

## 2017-01-27 DIAGNOSIS — E11621 Type 2 diabetes mellitus with foot ulcer: Secondary | ICD-10-CM | POA: Insufficient documentation

## 2017-01-27 DIAGNOSIS — I2584 Coronary atherosclerosis due to calcified coronary lesion: Secondary | ICD-10-CM | POA: Diagnosis not present

## 2017-01-27 NOTE — Patient Instructions (Signed)
Follow up in 6 Months.  We will contact you to schedule your appointment.

## 2017-01-27 NOTE — Progress Notes (Signed)
Dictation #1 [id number]  [id number] Patient ID: Oscar [name], male   DOB: 1945/12/17, 71 y.o.   MRN: [id number]    Advanced Heart Failure Clinic Note   Referring Physician: Lovena Le   HPI: Oscar [name] is a 71 y.o. male with h/o morbid obesity, DM2, HTN, permanent AF, OSA on CPAP and diastolic HF. Referred by Dr. Lovena Le for further evaluation of worsening dyspnea.   Developed PAF about 2 years ago. He has been in AF since that time and has been relatively asymptomatic. February 2016 had a diabetic foot ulcer on the right heel with gangrene. Was operated on by Dr. Sharol Given and had limited mobility for over 8 months with only bed to chair activity.   Today he returns for HF follow up. Overall feeling ok. Denies SOB/PND/Orthopnea. Not weighing at home. He is not exercising. Only walking when he goes shopping. Taking all medications.   Studies: Last echo 11/16 - Left ventricle: The cavity size was normal. Systolic function was normal. The estimated ejection fraction was in the range of 55% to 60%. Wall motion was normal; there were no regional wall motion abnormalities. - Aortic valve: There was trivial regurgitation. - Mitral valve: Calcified annulus. There was mild regurgitation. - Left atrium: The atrium was moderately dilated. - Right atrium: The atrium was mildly dilated. - Atrial septum: No defect or patent foramen ovale was identified. - Pulmonary arteries: PA peak pressure: 37 mm Hg (S).  CPX 2/17  FVC 2.11 (53%)    FEV1 1.55 (50%)     FEV1/FVC 74 (95%)     MVV 76 (61%)  Resting HR: 107 Peak HR: 136  (91% age predicted max HR) BP rest: 136/70 BP peak: 146/68 Peak VO2: 10.4 (56% predicted peak VO2) VE/VCO2 slope: 29.8 OUES: 1 Peak RER: 1.14 Ventilatory Threshold: 7.9 (42.5% predicted or measured peak VO2) Peak RR 24 Peak Ventilation: 45.9 VE/MVV: 60% PETCO2 at peak: 36 O2pulse: 10  (59% predicted O2pulse)  Conclusion: Exercise testing with  gas exchange demonstrates a severe functional impairment when compared to matched sedentary norms. The limitation appears to be primarily due to the patients severe obesity and related restrictive lung physiology. However, the blunted BP response to exercise and low O2 pulse suggest a concomitant circulatory limitation which is likely related to his diastolic HF and atrial fibrillation.    Past Medical History:  Diagnosis Date  . Anal fissure    h/o - no recent complications  . Arthritis    hips  . Atrial fibrillation ([place])   . Diabetes mellitus without complication ([place])    improved after diet/exercise  . Enlarged prostate   . GERD (gastroesophageal reflux disease)   . Gout   . H/O hiatal hernia   . Hx of typhoid fever    as a child  . Hypertension    improved after diet/exercise  . Kidney stones   . OSA (obstructive sleep apnea)    wears cpap  . PONV (postoperative nausea and vomiting)    also difficulty waking up  . Stroke [facility]) 08/2013   short term memory loss (slight)    Current Outpatient Prescriptions  Medication Sig Dispense Refill  . acetaminophen (TYLENOL) 500 MG tablet Take 1,500 mg by mouth every 6 (six) hours as needed for moderate pain. Reported on 10/23/2015    . allopurinol (ZYLOPRIM) 300 MG tablet Take 300 mg by mouth 3 (three) times daily as needed. Reported on 12/27/2015  12  . apixaban (ELIQUIS) 5 MG TABS tablet Take  1 tablet (5 mg total) by mouth 2 (two) times daily. 180 tablet 3  . blood glucose meter kit and supplies KIT Dispense based on patient and insurance preference. Use up to four times daily as directed. (FOR ICD-9 250.00, 250.01). 1 each 0  . diltiazem (CARDIZEM CD) 360 MG 24 hr capsule Take 1 capsule (360 mg total) by mouth daily. 90 capsule 0  . escitalopram (LEXAPRO) 10 MG tablet Take 10 mg by mouth at bedtime.     . Febuxostat (ULORIC) 80 MG TABS Take 80 mg by mouth daily.    . furosemide (LASIX) 80 MG tablet Take 1 tablet (80 mg total) by  mouth daily. T 90 tablet 3  . glyBURIDE (DIABETA) 5 MG tablet     . guaiFENesin-dextromethorphan (ROBITUSSIN DM) 100-10 MG/5ML syrup Take 5 mLs by mouth every 4 (four) hours as needed for cough. 118 mL 0  . indomethacin (INDOCIN) 50 MG capsule Take 50 mg by mouth 3 (three) times daily as needed (for gout flare ups). Reported on 12/27/2015    . insulin aspart (NOVOLOG) 100 UNIT/ML injection Inject 0-9 Units into the skin 3 (three) times daily as needed for high blood sugar.    Marland Kitchen LEVEMIR 100 UNIT/ML injection Inject 20 Units into the skin at bedtime.  5  . metformin (FORTAMET) 1000 MG (OSM) 24 hr tablet Take 1,000 mg by mouth 2 (two) times daily.  2  . metolazone (ZAROXOLYN) 2.5 MG tablet Take 1 tablet every Wednesday. 10 tablet 3  . mupirocin ointment (BACTROBAN) 2 % Apply 1 application topically 2 (two) times daily. Apply to the affected area 2 times a day 22 g 3  . pantoprazole (PROTONIX) 40 MG tablet Take 1 tablet (40 mg total) by mouth daily. 30 tablet 1  . potassium chloride SA (K-DUR,KLOR-CON) 20 MEQ tablet Take 2 tablets (40 mEq total) by mouth daily. 180 tablet 3  . simvastatin (ZOCOR) 20 MG tablet Take one (1) tablet (20 mg total) by mouth on Mondays, Wednesdays, and Fridays.    . tamsulosin (FLOMAX) 0.4 MG CAPS capsule Take 1 capsule (0.4 mg total) by mouth daily after breakfast. 30 capsule 0   No current facility-administered medications for this encounter.     Allergies  Allergen Reactions  . Iohexol      Desc: HIVES S/P 13HR.PREMEDS, ?LOW DOSAGE PREMEDS   . Ivp Dye [Iodinated Diagnostic Agents]     welps       Social History   Social History  . Marital status: Married    Spouse name: N/A  . Number of children: Y  . Years of education: N/A   Occupational History  . retired    Social History Main Topics  . Smoking status: Never Smoker  . Smokeless tobacco: Never Used  . Alcohol use 0.0 oz/week     Comment: rare - 1-2x /yr  . Drug use: No  . Sexual activity: Not  on file   Other Topics Concern  . Not on file   Social History Narrative  . No narrative on file      Family History  Problem Relation Age of Onset  . COPD Father   . Heart attack Sister        died age 52 of MI    Vitals:   01/27/17 0918  BP: 130/76  Pulse: 73  SpO2: 99%  Weight: 293 lb 12 oz (133.2 kg)   Wt Readings from Last 3 Encounters:  01/27/17 293 lb 12 oz (  133.2 kg)  09/10/16 299 lb 3.2 oz (135.7 kg)  12/27/15 289 lb 8 oz (131.3 kg)     PHYSICAL EXAM: General:  Well appearing. No resp difficulty HEENT: normal Neck: supple. no JVD. Carotids 2+ bilat; no bruits. No lymphadenopathy or thryomegaly appreciated. Cor: PMI nondisplaced. Regular rate & rhythm. No rubs, gallops or murmurs. Lungs: clear Abdomen: obese, soft, nontender, nondistended. No hepatosplenomegaly. No bruits or masses. Good bowel sounds. Extremities: no cyanosis, clubbing, rash, edema Neuro: alert & orientedx3, cranial nerves grossly intact. moves all 4 extremities w/o difficulty. Affect pleasant   ASSESSMENT & PLAN: 1. Chronic diastolic HF Volume status stable. Continue current diuretics regimen.  2. Morbid obesity-  Body mass index is 43.38 kg/m. Discussed weight loss 3. DM2 with recent foot ulcer 4. Diffuse coronary calcifications on CT chest  Follow up in 6 months.    Rochelle Larue Ninfa Meeker, NP-C  9:34 AM

## 2017-01-28 DIAGNOSIS — E782 Mixed hyperlipidemia: Secondary | ICD-10-CM | POA: Diagnosis not present

## 2017-01-28 DIAGNOSIS — E1165 Type 2 diabetes mellitus with hyperglycemia: Secondary | ICD-10-CM | POA: Diagnosis not present

## 2017-01-28 DIAGNOSIS — E08621 Diabetes mellitus due to underlying condition with foot ulcer: Secondary | ICD-10-CM | POA: Diagnosis not present

## 2017-01-28 DIAGNOSIS — E0840 Diabetes mellitus due to underlying condition with diabetic neuropathy, unspecified: Secondary | ICD-10-CM | POA: Diagnosis not present

## 2017-01-28 DIAGNOSIS — Z7984 Long term (current) use of oral hypoglycemic drugs: Secondary | ICD-10-CM | POA: Diagnosis not present

## 2017-01-28 DIAGNOSIS — Z794 Long term (current) use of insulin: Secondary | ICD-10-CM | POA: Diagnosis not present

## 2017-01-28 DIAGNOSIS — Z6841 Body Mass Index (BMI) 40.0 and over, adult: Secondary | ICD-10-CM | POA: Diagnosis not present

## 2017-01-28 DIAGNOSIS — I872 Venous insufficiency (chronic) (peripheral): Secondary | ICD-10-CM | POA: Diagnosis not present

## 2017-01-28 DIAGNOSIS — G4733 Obstructive sleep apnea (adult) (pediatric): Secondary | ICD-10-CM | POA: Diagnosis not present

## 2017-01-28 DIAGNOSIS — M109 Gout, unspecified: Secondary | ICD-10-CM | POA: Diagnosis not present

## 2017-01-28 DIAGNOSIS — E559 Vitamin D deficiency, unspecified: Secondary | ICD-10-CM | POA: Diagnosis not present

## 2017-01-28 DIAGNOSIS — R0902 Hypoxemia: Secondary | ICD-10-CM | POA: Diagnosis not present

## 2017-03-04 DIAGNOSIS — Z9111 Patient's noncompliance with dietary regimen: Secondary | ICD-10-CM | POA: Diagnosis not present

## 2017-03-04 DIAGNOSIS — Z6841 Body Mass Index (BMI) 40.0 and over, adult: Secondary | ICD-10-CM | POA: Diagnosis not present

## 2017-03-04 DIAGNOSIS — E1165 Type 2 diabetes mellitus with hyperglycemia: Secondary | ICD-10-CM | POA: Diagnosis not present

## 2017-03-20 ENCOUNTER — Ambulatory Visit (INDEPENDENT_AMBULATORY_CARE_PROVIDER_SITE_OTHER): Payer: Medicare Other

## 2017-03-20 ENCOUNTER — Encounter (INDEPENDENT_AMBULATORY_CARE_PROVIDER_SITE_OTHER): Payer: Self-pay | Admitting: Orthopedic Surgery

## 2017-03-20 ENCOUNTER — Ambulatory Visit (INDEPENDENT_AMBULATORY_CARE_PROVIDER_SITE_OTHER): Payer: Medicare Other | Admitting: Orthopedic Surgery

## 2017-03-20 DIAGNOSIS — L97521 Non-pressure chronic ulcer of other part of left foot limited to breakdown of skin: Secondary | ICD-10-CM

## 2017-03-20 NOTE — Progress Notes (Signed)
Office Visit Note   Patient: Oscar [name]           Date of Birth: Jun 01, 1946           MRN: [id number] Visit Date: 03/20/2017              Requested by: Josetta Huddle, MD [address] [place], [place] [address] PCP: Josetta Huddle, MD  Chief Complaint  Patient presents with  . Left Foot - Open Wound      HPI: Patient is a 71 year old gentleman who states he's been having increasing ulceration and bleeding from the first metatarsal head left foot. Patient states that this started after he was debrided by podiatry. Patient complains of increasing pain and ulceration.  Assessment & Plan: Visit Diagnoses:  1. Chronic ulcer of great toe of left foot, limited to breakdown of skin ([place])     Plan: Ulcer debridement of skin and soft tissue a felt relieving pad was placed to unload the first metatarsal head. He will continue with dressing changes follow-up in the office in 1 week for reevaluation may need to add additional padding to unload the first metatarsal head.  Follow-Up Instructions: Return in about 1 week (around 03/27/2017).   Ortho Exam  Patient is alert, oriented, no adenopathy, well-dressed, normal affect, normal respiratory effort. Examination he has an antalgic gait he does have a palpable dorsalis pedis and posterior tibial pulse despite the calcified vessels. There is no ischemic changes in his foot he has fixed clawing of the second toe. He has a plantarflexed first ray with a large ulcer beneath the first metatarsal head. After informed consent a 10 blade knife was used to debride the skin and soft tissue back to healthy viable granulation tissue this was touched with silver nitrate the ulcer is 3 cm in diameter and 3 mm deep this does not probe to bone or tendon.  Imaging: Xr Foot Complete Left  Result Date: 03/20/2017 Three-view radiographs of the left foot shows calcification of the dorsalis pedis and posterior tibial vessel and this extends into the  forefoot. Patient does not have any destructive bony changes no signs of osteomyelitis.  No images are attached to the encounter.  Labs: Lab Results  Component Value Date   HGBA1C 10.1 (H) 09/08/2016   HGBA1C 9.1 (H) 08/28/2014   HGBA1C 10.1 (H) 08/07/2013   ESRSEDRATE 75 (H) 08/28/2014   ESRSEDRATE 42 (H) 11/17/2012   CRP 12.9 (H) 08/28/2014   LABURIC 3.0 (L) 08/31/2014   REPTSTATUS 09/09/2016 FINAL 09/07/2016   CULT NO GROWTH 09/07/2016    Orders:  Orders Placed This Encounter  Procedures  . XR Foot Complete Left   No orders of the defined types were placed in this encounter.    Procedures: No procedures performed  Clinical Data: No additional findings.  ROS:  All other systems negative, except as noted in the HPI. Review of Systems  Objective: Vital Signs: There were no vitals taken for this visit.  Specialty Comments:  No specialty comments available.  PMFS History: Patient Active Problem List   Diagnosis Date Noted  . Non-pressure chronic ulcer of right heel and midfoot limited to breakdown of skin ([place]) 10/07/2016  . Influenza A   . AKI (acute kidney injury) ([place]) 09/07/2016  . Acute encephalopathy 09/07/2016  . Acute respiratory failure with hypoxia ([place]) 09/07/2016  . Elevated lactic acid level 09/07/2016  . Elevated troponin 09/07/2016  . Coronary artery calcification seen on CT scan 10/28/2015  .  Acute on chronic diastolic (congestive) heart failure ([place]) 09/26/2015  . OSA (obstructive sleep apnea) 02/07/2015  . Heel ulcer due to DM ([place]) 09/15/2014  . Sepsis ([place]) 08/28/2014  . Cellulitis 08/28/2014  . Chronic diastolic heart failure ([place]) 09/21/2013  . CVA (cerebral infarction) 08/08/2013  . Dizziness 08/07/2013  . TIA (transient ischemic attack) 08/07/2013  . Atrial fibrillation ([place]) 08/07/2013  . Hypertension 08/07/2013  . Anemia 08/07/2013  . Dyspnea 06/21/2013  . Chest pain 11/16/2012  . Atrial fibrillation with RVR ([place]) 11/16/2012   . Gout 11/16/2012  . Diabetes mellitus type 2, uncontrolled, with complications ([place]) 62/83/6629  . CKD (chronic kidney disease), stage II 11/16/2012  . Atypical atrial flutter ([place]) 11/16/2012  . Pleuritic chest pain 11/16/2012  . Morbid obesity ([place]) 11/16/2012  . Obstructive sleep apnea 11/16/2012   Past Medical History:  Diagnosis Date  . Anal fissure    h/o - no recent complications  . Arthritis    hips  . Atrial fibrillation ([place])   . Diabetes mellitus without complication ([place])    improved after diet/exercise  . Enlarged prostate   . GERD (gastroesophageal reflux disease)   . Gout   . H/O hiatal hernia   . Hx of typhoid fever    as a child  . Hypertension    improved after diet/exercise  . Kidney stones   . OSA (obstructive sleep apnea)    wears cpap  . PONV (postoperative nausea and vomiting)    also difficulty waking up  . Stroke [facility]) 08/2013   short term memory loss (slight)    Family History  Problem Relation Age of Onset  . COPD Father   . Heart attack Sister        died age 40 of MI    Past Surgical History:  Procedure Laterality Date  . carpel tunnel Bilateral   . CHOLECYSTECTOMY    . COLONOSCOPY    . EYE SURGERY     laser surgery   . I&D EXTREMITY Left 09/15/2014   Procedure: Excision Necrotic Left Achilles, Skin Graft, apply Wound VAC;  Surgeon: Newt Minion, MD;  Location: [place] [facility];  Service: Orthopedics;  Laterality: Left;  . KIDNEY STONE SURGERY    . KNEE ARTHROSCOPY Bilateral   . LIGAMENT REPAIR    . TONSILLECTOMY    . VASECTOMY     Social History   Occupational History  . retired    Social History Main Topics  . Smoking status: Never Smoker  . Smokeless tobacco: Never Used  . Alcohol use 0.0 oz/week     Comment: rare - 1-2x /yr  . Drug use: No  . Sexual activity: Not on file

## 2017-03-27 ENCOUNTER — Ambulatory Visit (INDEPENDENT_AMBULATORY_CARE_PROVIDER_SITE_OTHER): Payer: Medicare Other | Admitting: Orthopedic Surgery

## 2017-04-09 ENCOUNTER — Ambulatory Visit (INDEPENDENT_AMBULATORY_CARE_PROVIDER_SITE_OTHER): Payer: Medicare Other | Admitting: Orthopedic Surgery

## 2017-04-09 ENCOUNTER — Encounter (INDEPENDENT_AMBULATORY_CARE_PROVIDER_SITE_OTHER): Payer: Self-pay | Admitting: Orthopedic Surgery

## 2017-04-09 ENCOUNTER — Ambulatory Visit: Payer: Medicare Other | Admitting: Podiatry

## 2017-04-09 DIAGNOSIS — L02612 Cutaneous abscess of left foot: Secondary | ICD-10-CM | POA: Insufficient documentation

## 2017-04-09 DIAGNOSIS — L97521 Non-pressure chronic ulcer of other part of left foot limited to breakdown of skin: Secondary | ICD-10-CM | POA: Diagnosis not present

## 2017-04-09 MED ORDER — DOXYCYCLINE HYCLATE 100 MG PO TABS: 100.0000 mg | ORAL_TABLET | Freq: Two times a day (BID) | ORAL | 0 refills | Status: DC

## 2017-04-09 NOTE — Progress Notes (Signed)
Office Visit Note   Patient: Oscar [name]           Date of Birth: 1945-09-29           MRN: [id number] Visit Date: 04/09/2017              Requested by: Josetta Huddle, MD [address] [place], [place] [address] PCP: Josetta Huddle, MD  Chief Complaint  Patient presents with  . Left Foot - Wound Check    1st metatarsal head ulcer      HPI: Patient is seen for evaluation for his left foot. Patient states that he is had a two-week history of chills in the afternoon with a temperature going up to 100. Patient states his blood sugar is actually decreased from being in the 400s to now in the 200s.  Assessment & Plan: Visit Diagnoses:  1. Chronic ulcer of great toe of left foot, limited to breakdown of skin ([place])   2. Cutaneous abscess of left foot     Plan: Start doxycycline Dial soap cleansing daily strict nonweightbearing on his forefoot. Patient states that he does have multiple postoperative shoes and does have a Darco shoe at home.  Follow-Up Instructions: Return in about 1 week (around 04/16/2017).   Ortho Exam  Patient is alert, oriented, no adenopathy, well-dressed, normal affect, normal respiratory effort. Examination patient has venous stasis swelling in both legs again the importance of wearing the medical compression stockings was discussed or no ulcers there is brawny skin color changes and pitting edema. Examination does have a good dorsalis pedis pulse of the left foot. He has a large calloused blister on the first metatarsal head. After informed consent a 10 blade knife was used to debride the skin and soft tissue and decompress the abscess. The abscess was 3 cm in diameter 1 cm deep. This was cleansed debrided and silver nitrate was used for hemostasis. 2 x 2's and a compressive wrap was applied.  Imaging: No results found. No images are attached to the encounter.  Labs: Lab Results  Component Value Date   HGBA1C 10.1 (H) 09/08/2016   HGBA1C 9.1  (H) 08/28/2014   HGBA1C 10.1 (H) 08/07/2013   ESRSEDRATE 75 (H) 08/28/2014   ESRSEDRATE 42 (H) 11/17/2012   CRP 12.9 (H) 08/28/2014   LABURIC 3.0 (L) 08/31/2014   REPTSTATUS 09/09/2016 FINAL 09/07/2016   CULT NO GROWTH 09/07/2016    Orders:  No orders of the defined types were placed in this encounter.  No orders of the defined types were placed in this encounter.    Procedures: No procedures performed  Clinical Data: No additional findings.  ROS:  All other systems negative, except as noted in the HPI. Review of Systems  Objective: Vital Signs: There were no vitals taken for this visit.  Specialty Comments:  No specialty comments available.  PMFS History: Patient Active Problem List   Diagnosis Date Noted  . Chronic ulcer of great toe of left foot, limited to breakdown of skin ([place]) 04/09/2017  . Cutaneous abscess of left foot 04/09/2017  . Non-pressure chronic ulcer of right heel and midfoot limited to breakdown of skin ([place]) 10/07/2016  . Influenza A   . AKI (acute kidney injury) ([place]) 09/07/2016  . Acute encephalopathy 09/07/2016  . Acute respiratory failure with hypoxia ([place]) 09/07/2016  . Elevated lactic acid level 09/07/2016  . Elevated troponin 09/07/2016  . Coronary artery calcification seen on CT scan 10/28/2015  . Acute on chronic  diastolic (congestive) heart failure ([place]) 09/26/2015  . OSA (obstructive sleep apnea) 02/07/2015  . Heel ulcer due to DM ([place]) 09/15/2014  . Sepsis ([place]) 08/28/2014  . Cellulitis 08/28/2014  . Chronic diastolic heart failure ([place]) 09/21/2013  . CVA (cerebral infarction) 08/08/2013  . Dizziness 08/07/2013  . TIA (transient ischemic attack) 08/07/2013  . Atrial fibrillation ([place]) 08/07/2013  . Hypertension 08/07/2013  . Anemia 08/07/2013  . Dyspnea 06/21/2013  . Chest pain 11/16/2012  . Atrial fibrillation with RVR ([place]) 11/16/2012  . Gout 11/16/2012  . Diabetes mellitus type 2, uncontrolled, with complications ([place])  42/35/3614  . CKD (chronic kidney disease), stage II 11/16/2012  . Atypical atrial flutter ([place]) 11/16/2012  . Pleuritic chest pain 11/16/2012  . Morbid obesity ([place]) 11/16/2012  . Obstructive sleep apnea 11/16/2012   Past Medical History:  Diagnosis Date  . Anal fissure    h/o - no recent complications  . Arthritis    hips  . Atrial fibrillation ([place])   . Diabetes mellitus without complication ([place])    improved after diet/exercise  . Enlarged prostate   . GERD (gastroesophageal reflux disease)   . Gout   . H/O hiatal hernia   . Hx of typhoid fever    as a child  . Hypertension    improved after diet/exercise  . Kidney stones   . OSA (obstructive sleep apnea)    wears cpap  . PONV (postoperative nausea and vomiting)    also difficulty waking up  . Stroke [facility]) 08/2013   short term memory loss (slight)    Family History  Problem Relation Age of Onset  . COPD Father   . Heart attack Sister        died age 42 of MI    Past Surgical History:  Procedure Laterality Date  . carpel tunnel Bilateral   . CHOLECYSTECTOMY    . COLONOSCOPY    . EYE SURGERY     laser surgery   . I&D EXTREMITY Left 09/15/2014   Procedure: Excision Necrotic Left Achilles, Skin Graft, apply Wound VAC;  Surgeon: Newt Minion, MD;  Location: [facility];  Service: Orthopedics;  Laterality: Left;  . KIDNEY STONE SURGERY    . KNEE ARTHROSCOPY Bilateral   . LIGAMENT REPAIR    . TONSILLECTOMY    . VASECTOMY     Social History   Occupational History  . retired    Social History Main Topics  . Smoking status: Never Smoker  . Smokeless tobacco: Never Used  . Alcohol use 0.0 oz/week     Comment: rare - 1-2x /yr  . Drug use: No  . Sexual activity: Not on file

## 2017-04-14 ENCOUNTER — Encounter: Payer: Self-pay | Admitting: Neurology

## 2017-04-14 ENCOUNTER — Ambulatory Visit (INDEPENDENT_AMBULATORY_CARE_PROVIDER_SITE_OTHER): Payer: Medicare Other | Admitting: Neurology

## 2017-04-14 VITALS — BP 138/66 | HR 90 | Ht 69.0 in | Wt 303.5 lb

## 2017-04-14 DIAGNOSIS — R413 Other amnesia: Secondary | ICD-10-CM

## 2017-04-14 DIAGNOSIS — E538 Deficiency of other specified B group vitamins: Secondary | ICD-10-CM | POA: Diagnosis not present

## 2017-04-14 HISTORY — DX: Other amnesia: R41.3

## 2017-04-14 MED ORDER — ALPRAZOLAM 0.5 MG PO TABS: ORAL_TABLET | ORAL | 0 refills | Status: DC

## 2017-04-14 NOTE — Progress Notes (Signed)
Reason for visit: Memory disturbance  Referring physician: Dr. Josepha Pigg is a 71 y.o. male  History of present illness:  [name] is a 71 year old right-handed white male with a history of diabetes, hypertension, atrial fibrillation, and cerebrovascular disease. The patient indicates that he had a stroke event 3 or 4 years ago that was associated with decline in memory at the time of the stroke episode. The patient got better some from the stroke with cognitive functioning but within the last 6 months his family has noted a decline in his memory. He has noted some problems with word finding and short-term memory. He is still operating a motor vehicle, he has no problems with this. His wife does the finances and always has. The patient is able to keep up with medications and appointments fairly well. He does have sleep apnea, he denies any significant daytime drowsiness. He does have some problems with dizziness with standing at times, he has some mild gait instability, he reports no falls. He reports no significant numbness or weakness of the extremities and he denies headache but he does have occasional neck pain. A CT scan of the brain done in January 2018 showed mild to moderate small vessel ischemic changes, no acute changes were seen. The patient is sent to this office for evaluation of the memory problems. The patient has had some irritability, his Lexapro dose was increased from 5 mg daily to 10 mg daily within the last 3 or 4 weeks.  Past Medical History:  Diagnosis Date  . Anal fissure    h/o - no recent complications  . Arthritis    hips  . Atrial fibrillation ([place])   . Diabetes mellitus without complication ([place])    improved after diet/exercise  . Enlarged prostate   . GERD (gastroesophageal reflux disease)   . Gout   . H/O hiatal hernia   . Hx of typhoid fever    as a child  . Hypertension    improved after diet/exercise  . Kidney stones   . OSA  (obstructive sleep apnea)    wears cpap  . PONV (postoperative nausea and vomiting)    also difficulty waking up  . Stroke [facility]) 08/2013   short term memory loss (slight)    Past Surgical History:  Procedure Laterality Date  . carpel tunnel Bilateral   . CHOLECYSTECTOMY    . COLONOSCOPY    . EYE SURGERY     laser surgery   . I&D EXTREMITY Left 09/15/2014   Procedure: Excision Necrotic Left Achilles, Skin Graft, apply Wound VAC;  Surgeon: Newt Minion, MD;  Location: [place];  Service: Orthopedics;  Laterality: Left;  . KIDNEY STONE SURGERY    . KNEE ARTHROSCOPY Bilateral   . LIGAMENT REPAIR    . TONSILLECTOMY    . VASECTOMY      Family History  Problem Relation Age of Onset  . COPD Father   . Heart attack Sister        died age 73 of MI    Social history:  reports that he has never smoked. He has never used smokeless tobacco. He reports that he drinks alcohol. He reports that he does not use drugs.  Medications:  Prior to Admission medications   Medication Sig Start Date End Date Taking? Authorizing Provider  acetaminophen (TYLENOL) 500 MG tablet Take 1,500 mg by mouth every 6 (six) hours as needed for moderate pain. Reported on 10/23/2015   Yes  [provider]  allopurinol (ZYLOPRIM) 300 MG tablet Take 300 mg by mouth 3 (three) times daily as needed. Reported on 12/27/2015 04/30/15  Yes [provider]  apixaban (ELIQUIS) 5 MG TABS tablet Take 1 tablet (5 mg total) by mouth 2 (two) times daily. 10/20/16  Yes Bensimhon, Shaune Pascal, MD  B-D INS SYRINGE 0.5CC/30GX1/2" 30G X 1/2" 0.5 ML MISC USE THREE TIMES A DAY DX-E11.65 INJECTION 04/01/17  Yes [provider]  blood glucose meter kit and supplies KIT Dispense based on patient and insurance preference. Use up to four times daily as directed. (FOR ICD-9 250.00, 250.01). 09/03/14  Yes Theodis Blaze, MD  diltiazem (CARDIZEM CD) 360 MG 24 hr capsule Take 1 capsule (360 mg total) by mouth daily. 02/20/14  Yes Evans Lance, MD  doxycycline (VIBRA-TABS) 100 MG tablet Take 1 tablet (100 mg total) by mouth 2 (two) times daily. 04/09/17  Yes Newt Minion, MD  escitalopram (LEXAPRO) 10 MG tablet Take 10 mg by mouth at bedtime.    Yes [provider]  Febuxostat (ULORIC) 80 MG TABS Take 80 mg by mouth daily.   Yes [provider]  furosemide (LASIX) 80 MG tablet Take 1 tablet (80 mg total) by mouth daily. T 01/28/16  Yes Bensimhon, Shaune Pascal, MD  glyBURIDE (DIABETA) 5 MG tablet  10/06/16  Yes [provider]  guaiFENesin-dextromethorphan (ROBITUSSIN DM) 100-10 MG/5ML syrup Take 5 mLs by mouth every 4 (four) hours as needed for cough. 09/10/16  Yes Theodis Blaze, MD  indomethacin (INDOCIN) 50 MG capsule Take 50 mg by mouth 3 (three) times daily as needed (for gout flare ups). Reported on 12/27/2015   Yes [provider]  insulin aspart (NOVOLOG) 100 UNIT/ML injection Inject 0-9 Units into the skin 3 (three) times daily as needed for high blood sugar.   Yes [provider]  LEVEMIR 100 UNIT/ML injection Inject 20 Units into the skin at bedtime. 05/16/15  Yes [provider]  metformin (FORTAMET) 1000 MG (OSM) 24 hr tablet Take 1,000 mg by mouth 2 (two) times daily. 07/23/16  Yes [provider]  metFORMIN (GLUCOPHAGE-XR) 500 MG 24 hr tablet TAKE 1 TABLET TWICE A DAY WITH MEALS ORALLY 90 DAYS 03/01/17  Yes [provider]  metolazone (ZAROXOLYN) 2.5 MG tablet Take 1 tablet every Wednesday. 11/07/16  Yes Bensimhon, Shaune Pascal, MD  mupirocin ointment (BACTROBAN) 2 % Apply 1 application topically 2 (two) times daily. Apply to the affected area 2 times a day 10/07/16  Yes Newt Minion, MD  pantoprazole (PROTONIX) 40 MG tablet Take 1 tablet (40 mg total) by mouth daily. 08/08/13  Yes Josetta Huddle, MD  potassium chloride SA (K-DUR,KLOR-CON) 20 MEQ tablet Take 2 tablets (40 mEq total) by mouth daily. 08/25/16  Yes Bensimhon, Shaune Pascal, MD  simvastatin (ZOCOR) 20  MG tablet Take one (1) tablet (20 mg total) by mouth on Mondays, Wednesdays, and Fridays.   Yes [provider]  tamsulosin (FLOMAX) 0.4 MG CAPS capsule Take 1 capsule (0.4 mg total) by mouth daily after breakfast. 08/08/13  Yes Josetta Huddle, MD      Allergies  Allergen Reactions  . Iohexol      Desc: HIVES S/P 13HR.PREMEDS, ?LOW DOSAGE PREMEDS   . Ivp Dye [Iodinated Diagnostic Agents]     welps     ROS:  Out of a complete 14 system review of symptoms, the patient complains only of the following symptoms, and all other reviewed  systems are negative.  Hearing loss Itching Loss of vision Shortness of breath Urination problems Memory loss, dizziness Decreased energy  Blood pressure 138/66, pulse 90, height _0  (1.753 m), weight (!) 303 lb 8 oz (137.7 kg).  Physical Exam  General: The patient is alert and cooperative at the time of the examination. The patient is markedly obese.  Eyes: Pupils are equal, round, and reactive to light. Discs are flat bilaterally.  Neck: The neck is supple, no carotid bruits are noted.  Respiratory: The respiratory examination is clear.  Cardiovascular: The cardiovascular examination reveals a regular rate and rhythm, no obvious murmurs or rubs are noted.  Skin: Extremities are with 2+ edema below the knees, the left foot is in a healing shoe and is wrapped.  Neurologic Exam  Mental status: The patient is alert and oriented x 3 at the time of the examination. The Mini-Mental Status Examination done today shows a total score 27/30.  Cranial nerves: Facial symmetry is present. There is good sensation of the face to pinprick and soft touch bilaterally. The strength of the facial muscles and the muscles to head turning and shoulder shrug are normal bilaterally. Speech is well enunciated, no aphasia or dysarthria is noted. Extraocular movements are full. Visual fields are full. The tongue is midline, and the patient has symmetric  elevation of the soft palate. No obvious hearing deficits are noted.  Motor: The motor testing reveals 5 over 5 strength of all 4 extremities. Good symmetric motor tone is noted throughout.  Sensory: Sensory testing is intact to pinprick, soft touch, vibration sensation, and position sense on all 4 extremities, with exception of a stocking pattern pinprick sensory deficit up to the knees bilaterally, mild decrease in position sense in the right foot. No evidence of extinction is noted.  Coordination: Cerebellar testing reveals good finger-nose-finger and heel-to-shin bilaterally.  Gait and station: Gait is normal. Tandem gait is unsteady. Romberg is negative. No drift is seen.  Reflexes: Deep tendon reflexes are symmetric, but are depressed bilaterally. Toes are downgoing bilaterally.   CT head and cervical 09/07/16:  IMPRESSION: No acute intracranial findings.  Chronic ischemic microvascular disease. Old lacune infarct over the right thalamus and small old cerebellar infarcts.  No acute cervical spine injury.  Mild spondylosis of the cervical spine with disc disease at the C5-6 and C6-7 levels. Bilateral neural foramina narrowing at multiple levels due to adjacent bony spurring.  * CT scan images were reviewed online. I agree with the written report.   Assessment/Plan:  1. Reported memory disturbance  2. Cerebrovascular disease  3. Atrial fibrillation  4. Diabetes  5. Hypertension  The patient does have risk factors for cerebrovascular disease. The patient has had some decline in memory over the last 6 months. We will check MRI of the brain. Blood work will be done today. He has had some irritability, the Lexapro can be increased to 20 mg daily dosing. He will follow-up in about 6 months, we will consider a medication for memory in the future.  Jill Alexanders MD 04/14/2017 10:39 AM  [facility] [address] [place],   [address]  Phone [phone] Fax [phone]

## 2017-04-14 NOTE — Patient Instructions (Signed)
   We will get blood work today and get MRI of the brain. 

## 2017-04-15 LAB — VITAMIN B12: Vitamin B-12: 448 pg/mL (ref 232–1245)

## 2017-04-15 LAB — RPR: RPR Ser Ql: NONREACTIVE

## 2017-04-16 ENCOUNTER — Encounter (INDEPENDENT_AMBULATORY_CARE_PROVIDER_SITE_OTHER): Payer: Self-pay | Admitting: Orthopedic Surgery

## 2017-04-16 ENCOUNTER — Ambulatory Visit (INDEPENDENT_AMBULATORY_CARE_PROVIDER_SITE_OTHER): Payer: Medicare Other | Admitting: Orthopedic Surgery

## 2017-04-16 DIAGNOSIS — L97521 Non-pressure chronic ulcer of other part of left foot limited to breakdown of skin: Secondary | ICD-10-CM

## 2017-04-16 NOTE — Progress Notes (Signed)
Office Visit Note   Patient: Oscar [name]           Date of Birth: Apr 21, 1946           MRN: [id number] Visit Date: 04/16/2017              Requested by: Josetta Huddle, MD [address] [place], [place] [address] PCP: Josetta Huddle, MD  Chief Complaint  Patient presents with  . Left Foot - Wound Check      HPI:  patient is a 71 year old gentleman who developed an abscess infection from an ulcer beneath the first metatarsal head left foot. Patient started on doxycycline one week ago he is currently wearing  Medical compression stockings and does have a felt relieving donut beneath the first metatarsal head to unload pressure.  Assessment & Plan: Visit Diagnoses:  1. Chronic ulcer of great toe of left foot, limited to breakdown of skin (HCC)     Plan:  Continue with the medical compression sock continue with the orthotics.  Follow-Up Instructions: Return in about 3 weeks (around 05/07/2017).   Ortho Exam  Patient is alert, oriented, no adenopathy, well-dressed, normal affect, normal respiratory effort. Patient has an antalgic gait.  examination patient's foot shows remarkable improvement. The large ulcer is almost completely healed this is 7 mm in diameter 0.1 mm deep with no cellulitis no drainage no odor no signs of infection.  Imaging: No results found. No images are attached to the encounter.  Labs: Lab Results  Component Value Date   HGBA1C 10.1 (H) 09/08/2016   HGBA1C 9.1 (H) 08/28/2014   HGBA1C 10.1 (H) 08/07/2013   ESRSEDRATE 75 (H) 08/28/2014   ESRSEDRATE 42 (H) 11/17/2012   CRP 12.9 (H) 08/28/2014   LABURIC 3.0 (L) 08/31/2014   REPTSTATUS 09/09/2016 FINAL 09/07/2016   CULT NO GROWTH 09/07/2016    Orders:  No orders of the defined types were placed in this encounter.  No orders of the defined types were placed in this encounter.    Procedures: No procedures performed  Clinical Data: No additional findings.  ROS:  All other  systems negative, except as noted in the HPI. Review of Systems  Objective: Vital Signs: There were no vitals taken for this visit.  Specialty Comments:  No specialty comments available.  PMFS History: Patient Active Problem List   Diagnosis Date Noted  . Memory change 04/14/2017  . Chronic ulcer of great toe of left foot, limited to breakdown of skin ([place]) 04/09/2017  . Cutaneous abscess of left foot 04/09/2017  . Non-pressure chronic ulcer of right heel and midfoot limited to breakdown of skin ([place]) 10/07/2016  . Influenza A   . AKI (acute kidney injury) ([place]) 09/07/2016  . Acute encephalopathy 09/07/2016  . Acute respiratory failure with hypoxia ([place]) 09/07/2016  . Elevated lactic acid level 09/07/2016  . Elevated troponin 09/07/2016  . Coronary artery calcification seen on CT scan 10/28/2015  . Acute on chronic diastolic (congestive) heart failure ([place]) 09/26/2015  . OSA (obstructive sleep apnea) 02/07/2015  . Heel ulcer due to DM ([place]) 09/15/2014  . Sepsis ([place]) 08/28/2014  . Cellulitis 08/28/2014  . Chronic diastolic heart failure ([place]) 09/21/2013  . CVA (cerebral infarction) 08/08/2013  . Dizziness 08/07/2013  . TIA (transient ischemic attack) 08/07/2013  . Atrial fibrillation ([place]) 08/07/2013  . Hypertension 08/07/2013  . Anemia 08/07/2013  . Dyspnea 06/21/2013  . Chest pain 11/16/2012  . Atrial fibrillation with RVR ([place]) 11/16/2012  . Gout  11/16/2012  . Diabetes mellitus type 2, uncontrolled, with complications ([place]) 11/94/1740  . CKD (chronic kidney disease), stage II 11/16/2012  . Atypical atrial flutter ([place]) 11/16/2012  . Pleuritic chest pain 11/16/2012  . Morbid obesity ([place]) 11/16/2012  . Obstructive sleep apnea 11/16/2012   Past Medical History:  Diagnosis Date  . Anal fissure    h/o - no recent complications  . Arthritis    hips  . Atrial fibrillation ([place])   . Diabetes mellitus without complication ([place])    improved after diet/exercise  .  Enlarged prostate   . GERD (gastroesophageal reflux disease)   . Gout   . H/O hiatal hernia   . Hx of typhoid fever    as a child  . Hypertension    improved after diet/exercise  . Kidney stones   . Memory change 04/14/2017  . OSA (obstructive sleep apnea)    wears cpap  . PONV (postoperative nausea and vomiting)    also difficulty waking up  . Stroke [facility]) 08/2013   short term memory loss (slight)    Family History  Problem Relation Age of Onset  . COPD Father   . Heart attack Sister        died age 15 of MI    Past Surgical History:  Procedure Laterality Date  . carpel tunnel Bilateral   . CHOLECYSTECTOMY    . COLONOSCOPY    . EYE SURGERY     laser surgery   . I&D EXTREMITY Left 09/15/2014   Procedure: Excision Necrotic Left Achilles, Skin Graft, apply Wound VAC;  Surgeon: Newt Minion, MD;  Location: [facility];  Service: Orthopedics;  Laterality: Left;  . KIDNEY STONE SURGERY    . KNEE ARTHROSCOPY Bilateral   . LIGAMENT REPAIR    . TONSILLECTOMY    . VASECTOMY     Social History   Occupational History  . Retired    Social History Main Topics  . Smoking status: Never Smoker  . Smokeless tobacco: Never Used  . Alcohol use 0.0 oz/week     Comment: rare - 1-2x /yr  . Drug use: No  . Sexual activity: Not on file

## 2017-04-28 ENCOUNTER — Ambulatory Visit
Admission: RE | Admit: 2017-04-28 | Discharge: 2017-04-28 | Disposition: A | Discharge status: Home / Self Care | Payer: Medicare Other | Source: Ambulatory Visit | Attending: Neurology | Admitting: Neurology

## 2017-04-28 DIAGNOSIS — R413 Other amnesia: Secondary | ICD-10-CM

## 2017-04-30 ENCOUNTER — Telehealth: Payer: Self-pay | Admitting: Neurology

## 2017-04-30 NOTE — Telephone Encounter (Signed)
i called patient. MRI the brain shows a moderate level small vessel ischemic changes, no acute changes seen. No change from 2014.  If desired, the patient may start a medication for memory, we will follow over time.   MRI brain 04/29/17:  IMPRESSION:  Abnormal MRI brain (without) demonstrating: 1. Moderate perisylvian and mesial temporal atrophy. 2. Mild periventricular and subcortical chronic small vessel ischemic disease. Chronic left cerebellar ischemic infarctions. 3. No acute findings. 4. No significant change from MRI on 08/07/13.

## 2017-05-04 DIAGNOSIS — H5202 Hypermetropia, left eye: Secondary | ICD-10-CM | POA: Diagnosis not present

## 2017-05-04 DIAGNOSIS — E103312 Type 1 diabetes mellitus with moderate nonproliferative diabetic retinopathy with macular edema, left eye: Secondary | ICD-10-CM | POA: Diagnosis not present

## 2017-05-04 DIAGNOSIS — H524 Presbyopia: Secondary | ICD-10-CM | POA: Diagnosis not present

## 2017-05-04 DIAGNOSIS — H5211 Myopia, right eye: Secondary | ICD-10-CM | POA: Diagnosis not present

## 2017-05-04 DIAGNOSIS — H52223 Regular astigmatism, bilateral: Secondary | ICD-10-CM | POA: Diagnosis not present

## 2017-05-06 ENCOUNTER — Other Ambulatory Visit (INDEPENDENT_AMBULATORY_CARE_PROVIDER_SITE_OTHER): Payer: Self-pay | Admitting: Orthopedic Surgery

## 2017-05-08 DIAGNOSIS — D509 Iron deficiency anemia, unspecified: Secondary | ICD-10-CM | POA: Diagnosis not present

## 2017-05-08 DIAGNOSIS — E782 Mixed hyperlipidemia: Secondary | ICD-10-CM | POA: Diagnosis not present

## 2017-05-08 DIAGNOSIS — E1140 Type 2 diabetes mellitus with diabetic neuropathy, unspecified: Secondary | ICD-10-CM | POA: Diagnosis not present

## 2017-05-08 DIAGNOSIS — E11621 Type 2 diabetes mellitus with foot ulcer: Secondary | ICD-10-CM | POA: Diagnosis not present

## 2017-05-08 DIAGNOSIS — I509 Heart failure, unspecified: Secondary | ICD-10-CM | POA: Diagnosis not present

## 2017-05-08 DIAGNOSIS — E113599 Type 2 diabetes mellitus with proliferative diabetic retinopathy without macular edema, unspecified eye: Secondary | ICD-10-CM | POA: Diagnosis not present

## 2017-05-08 DIAGNOSIS — E1165 Type 2 diabetes mellitus with hyperglycemia: Secondary | ICD-10-CM | POA: Diagnosis not present

## 2017-05-08 DIAGNOSIS — I4891 Unspecified atrial fibrillation: Secondary | ICD-10-CM | POA: Diagnosis not present

## 2017-05-08 DIAGNOSIS — I6789 Other cerebrovascular disease: Secondary | ICD-10-CM | POA: Diagnosis not present

## 2017-05-11 ENCOUNTER — Ambulatory Visit (INDEPENDENT_AMBULATORY_CARE_PROVIDER_SITE_OTHER): Payer: Medicare Other | Admitting: Family

## 2017-05-11 ENCOUNTER — Encounter (INDEPENDENT_AMBULATORY_CARE_PROVIDER_SITE_OTHER): Payer: Self-pay | Admitting: Orthopedic Surgery

## 2017-05-11 ENCOUNTER — Other Ambulatory Visit (INDEPENDENT_AMBULATORY_CARE_PROVIDER_SITE_OTHER): Payer: Self-pay | Admitting: Orthopedic Surgery

## 2017-05-11 DIAGNOSIS — L97521 Non-pressure chronic ulcer of other part of left foot limited to breakdown of skin: Secondary | ICD-10-CM

## 2017-05-11 DIAGNOSIS — L02612 Cutaneous abscess of left foot: Secondary | ICD-10-CM

## 2017-05-11 MED ORDER — INDOMETHACIN 50 MG PO CAPS: 50.0000 mg | ORAL_CAPSULE | Freq: Three times a day (TID) | ORAL | 1 refills | Status: AC | PRN

## 2017-05-11 MED ORDER — INDOMETHACIN 50 MG PO CAPS: 50.0000 mg | ORAL_CAPSULE | Freq: Two times a day (BID) | ORAL | 2 refills | Status: DC

## 2017-05-11 MED ORDER — INDOMETHACIN 50 MG PO CAPS: 50.0000 mg | ORAL_CAPSULE | Freq: Three times a day (TID) | ORAL | 3 refills | Status: DC | PRN

## 2017-05-11 NOTE — Addendum Note (Signed)
Addended by: Dondra Prader R on: 05/11/2017 04:27 PM   Modules accepted: Orders

## 2017-05-11 NOTE — Progress Notes (Signed)
Office Visit Note   Patient: Oscar [name]           Date of Birth: 07/29/1946           MRN: [id number] Visit Date: 05/11/2017              Requested by: Josetta Huddle, MD [address] [place], [place] [address] PCP: Josetta Huddle, MD  Chief Complaint  Patient presents with  . Left Foot - Follow-up  . Right Foot - Edema      HPI:  patient is a 71 year old gentleman who developed an abscess infection from an ulcer beneath the first metatarsal head left foot. Seen today in follow up. Patient completed a course of doxycycline. Medical compression stockings and does have a felt relieving donut beneath the first metatarsal head to unload pressure. Is pleased with progress. No drainage. States swelling to his right 2nd toe has improved.   Assessment & Plan: Visit Diagnoses:  1. Chronic ulcer of great toe of left foot, limited to breakdown of skin ([place])   2. Cutaneous abscess of left foot     Plan:  Continue with the medical compression sock continue with the orthotics.  Follow-Up Instructions: Return if symptoms worsen or fail to improve.   Ortho Exam  Patient is alert, oriented, no adenopathy, well-dressed, normal affect, normal respiratory effort. Patient has an antalgic gait. On examination patient's foot shows remarkable improvement. The large ulcer is completely healed. no cellulitis no drainage no odor no signs of infection.  Imaging: No results found. No images are attached to the encounter.  Labs: Lab Results  Component Value Date   HGBA1C 10.1 (H) 09/08/2016   HGBA1C 9.1 (H) 08/28/2014   HGBA1C 10.1 (H) 08/07/2013   ESRSEDRATE 75 (H) 08/28/2014   ESRSEDRATE 42 (H) 11/17/2012   CRP 12.9 (H) 08/28/2014   LABURIC 3.0 (L) 08/31/2014   REPTSTATUS 09/09/2016 FINAL 09/07/2016   CULT NO GROWTH 09/07/2016    Orders:  No orders of the defined types were placed in this encounter.  No orders of the defined types were placed in this encounter.    Procedures: No procedures performed  Clinical Data: No additional findings.  ROS:  All other systems negative, except as noted in the HPI. Review of Systems  Constitutional: Negative for chills and fever.  Cardiovascular: Negative for leg swelling.  Skin: Negative for color change and wound.    Objective: Vital Signs: There were no vitals taken for this visit.  Specialty Comments:  No specialty comments available.  PMFS History: Patient Active Problem List   Diagnosis Date Noted  . Memory change 04/14/2017  . Chronic ulcer of great toe of left foot, limited to breakdown of skin ([place]) 04/09/2017  . Cutaneous abscess of left foot 04/09/2017  . Non-pressure chronic ulcer of right heel and midfoot limited to breakdown of skin ([place]) 10/07/2016  . Influenza A   . AKI (acute kidney injury) ([place]) 09/07/2016  . Acute encephalopathy 09/07/2016  . Acute respiratory failure with hypoxia ([place]) 09/07/2016  . Elevated lactic acid level 09/07/2016  . Elevated troponin 09/07/2016  . Coronary artery calcification seen on CT scan 10/28/2015  . Acute on chronic diastolic (congestive) heart failure ([place]) 09/26/2015  . OSA (obstructive sleep apnea) 02/07/2015  . Sepsis ([place]) 08/28/2014  . Chronic diastolic heart failure ([place]) 09/21/2013  . CVA (cerebral infarction) 08/08/2013  . Dizziness 08/07/2013  . TIA (transient ischemic attack) 08/07/2013  . Atrial fibrillation ([place]) 08/07/2013  .  Hypertension 08/07/2013  . Anemia 08/07/2013  . Dyspnea 06/21/2013  . Chest pain 11/16/2012  . Atrial fibrillation with RVR ([place]) 11/16/2012  . Gout 11/16/2012  . Diabetes mellitus type 2, uncontrolled, with complications ([place]) 23/53/6144  . CKD (chronic kidney disease), stage II 11/16/2012  . Atypical atrial flutter ([place]) 11/16/2012  . Pleuritic chest pain 11/16/2012  . Morbid obesity ([place]) 11/16/2012  . Obstructive sleep apnea 11/16/2012   Past Medical History:  Diagnosis Date  . Anal fissure     h/o - no recent complications  . Arthritis    hips  . Atrial fibrillation ([place])   . Diabetes mellitus without complication ([place])    improved after diet/exercise  . Enlarged prostate   . GERD (gastroesophageal reflux disease)   . Gout   . H/O hiatal hernia   . Hx of typhoid fever    as a child  . Hypertension    improved after diet/exercise  . Kidney stones   . Memory change 04/14/2017  . OSA (obstructive sleep apnea)    wears cpap  . PONV (postoperative nausea and vomiting)    also difficulty waking up  . Stroke [facility]) 08/2013   short term memory loss (slight)    Family History  Problem Relation Age of Onset  . COPD Father   . Heart attack Sister        died age 67 of MI    Past Surgical History:  Procedure Laterality Date  . carpel tunnel Bilateral   . CHOLECYSTECTOMY    . COLONOSCOPY    . EYE SURGERY     laser surgery   . I&D EXTREMITY Left 09/15/2014   Procedure: Excision Necrotic Left Achilles, Skin Graft, apply Wound VAC;  Surgeon: Newt Minion, MD;  Location: [facility];  Service: Orthopedics;  Laterality: Left;  . KIDNEY STONE SURGERY    . KNEE ARTHROSCOPY Bilateral   . LIGAMENT REPAIR    . TONSILLECTOMY    . VASECTOMY     Social History   Occupational History  . Retired    Social History Main Topics  . Smoking status: Never Smoker  . Smokeless tobacco: Never Used  . Alcohol use 0.0 oz/week     Comment: rare - 1-2x /yr  . Drug use: No  . Sexual activity: Not on file

## 2017-05-12 ENCOUNTER — Ambulatory Visit: Payer: Medicare Other | Admitting: Neurology

## 2017-05-13 DIAGNOSIS — L821 Other seborrheic keratosis: Secondary | ICD-10-CM | POA: Diagnosis not present

## 2017-05-13 DIAGNOSIS — Z85828 Personal history of other malignant neoplasm of skin: Secondary | ICD-10-CM | POA: Diagnosis not present

## 2017-05-13 DIAGNOSIS — D225 Melanocytic nevi of trunk: Secondary | ICD-10-CM | POA: Diagnosis not present

## 2017-05-13 DIAGNOSIS — L281 Prurigo nodularis: Secondary | ICD-10-CM | POA: Diagnosis not present

## 2017-05-13 DIAGNOSIS — D1801 Hemangioma of skin and subcutaneous tissue: Secondary | ICD-10-CM | POA: Diagnosis not present

## 2017-05-13 DIAGNOSIS — L570 Actinic keratosis: Secondary | ICD-10-CM | POA: Diagnosis not present

## 2017-05-13 DIAGNOSIS — C44519 Basal cell carcinoma of skin of other part of trunk: Secondary | ICD-10-CM | POA: Diagnosis not present

## 2017-05-13 DIAGNOSIS — D692 Other nonthrombocytopenic purpura: Secondary | ICD-10-CM | POA: Diagnosis not present

## 2017-05-13 DIAGNOSIS — C44511 Basal cell carcinoma of skin of breast: Secondary | ICD-10-CM | POA: Diagnosis not present

## 2017-05-13 DIAGNOSIS — L111 Transient acantholytic dermatosis [Grover]: Secondary | ICD-10-CM | POA: Diagnosis not present

## 2017-05-13 DIAGNOSIS — L814 Other melanin hyperpigmentation: Secondary | ICD-10-CM | POA: Diagnosis not present

## 2017-05-27 DIAGNOSIS — K219 Gastro-esophageal reflux disease without esophagitis: Secondary | ICD-10-CM | POA: Diagnosis not present

## 2017-05-27 DIAGNOSIS — I509 Heart failure, unspecified: Secondary | ICD-10-CM | POA: Diagnosis not present

## 2017-05-27 DIAGNOSIS — Z9111 Patient's noncompliance with dietary regimen: Secondary | ICD-10-CM | POA: Diagnosis not present

## 2017-05-27 DIAGNOSIS — E1165 Type 2 diabetes mellitus with hyperglycemia: Secondary | ICD-10-CM | POA: Diagnosis not present

## 2017-05-27 DIAGNOSIS — E113599 Type 2 diabetes mellitus with proliferative diabetic retinopathy without macular edema, unspecified eye: Secondary | ICD-10-CM | POA: Diagnosis not present

## 2017-05-27 DIAGNOSIS — E559 Vitamin D deficiency, unspecified: Secondary | ICD-10-CM | POA: Diagnosis not present

## 2017-05-27 DIAGNOSIS — E782 Mixed hyperlipidemia: Secondary | ICD-10-CM | POA: Diagnosis not present

## 2017-05-27 DIAGNOSIS — E0840 Diabetes mellitus due to underlying condition with diabetic neuropathy, unspecified: Secondary | ICD-10-CM | POA: Diagnosis not present

## 2017-05-27 DIAGNOSIS — M109 Gout, unspecified: Secondary | ICD-10-CM | POA: Diagnosis not present

## 2017-05-27 DIAGNOSIS — D509 Iron deficiency anemia, unspecified: Secondary | ICD-10-CM | POA: Diagnosis not present

## 2017-05-27 DIAGNOSIS — R413 Other amnesia: Secondary | ICD-10-CM | POA: Diagnosis not present

## 2017-07-23 ENCOUNTER — Encounter (INDEPENDENT_AMBULATORY_CARE_PROVIDER_SITE_OTHER): Payer: Self-pay | Admitting: Family

## 2017-07-23 ENCOUNTER — Ambulatory Visit (INDEPENDENT_AMBULATORY_CARE_PROVIDER_SITE_OTHER): Payer: Medicare Other | Admitting: Family

## 2017-07-23 DIAGNOSIS — L97521 Non-pressure chronic ulcer of other part of left foot limited to breakdown of skin: Secondary | ICD-10-CM | POA: Diagnosis not present

## 2017-07-23 NOTE — Progress Notes (Signed)
Office Visit Note   Patient: Oscar [name]           Date of Birth: Apr 16, 1946           MRN: [id number] Visit Date: 07/23/2017              Requested by: Josetta Huddle, MD [address] [place], [place] [address] PCP: Josetta Huddle, MD  Chief Complaint  Patient presents with  . Left Foot - Wound Check      HPI:  patient is a 71 year old gentleman who developed an abscess infection from an ulcer beneath the first metatarsal head left foot. Patient started on doxycycline. he is currently wearing Medical compression stockings.   Assessment & Plan: Visit Diagnoses:  1. Chronic ulcer of great toe of left foot, limited to breakdown of skin (HCC)     Plan:  Continue Doxycycline. Daily Mupirocin dressings. Offload forefoot on left. Continue with the medical compression sock continue with the orthotics.  Follow-Up Instructions: Return in about 3 weeks (around 08/10/2017).   Ortho Exam  Patient is alert, oriented, no adenopathy, well-dressed, normal affect, normal respiratory effort. Patient has an antalgic gait. On examination patient's foot shows callused ulceration, is debrided of nonviable tissue. Is 25 mm in diameter and 4 mm thick. Central ulceration is 7 mm in diameter and 2 mm deep post debridement. No exudative tissue. no cellulitis no drainage no odor no signs of infection.  Imaging: No results found. No images are attached to the encounter.  Labs: Lab Results  Component Value Date   HGBA1C 10.1 (H) 09/08/2016   HGBA1C 9.1 (H) 08/28/2014   HGBA1C 10.1 (H) 08/07/2013   ESRSEDRATE 75 (H) 08/28/2014   ESRSEDRATE 42 (H) 11/17/2012   CRP 12.9 (H) 08/28/2014   LABURIC 3.0 (L) 08/31/2014   REPTSTATUS 09/09/2016 FINAL 09/07/2016   CULT NO GROWTH 09/07/2016    Orders:  No orders of the defined types were placed in this encounter.  No orders of the defined types were placed in this encounter.    Procedures: No procedures performed  Clinical Data: No  additional findings.  ROS:  All other systems negative, except as noted in the HPI. Review of Systems  Constitutional: Negative for chills and fever.  Cardiovascular: Negative for leg swelling.  Skin: Positive for wound. Negative for color change.    Objective: Vital Signs: There were no vitals taken for this visit.  Specialty Comments:  No specialty comments available.  PMFS History: Patient Active Problem List   Diagnosis Date Noted  . Memory change 04/14/2017  . Chronic ulcer of great toe of left foot, limited to breakdown of skin ([place]) 04/09/2017  . Cutaneous abscess of left foot 04/09/2017  . Non-pressure chronic ulcer of right heel and midfoot limited to breakdown of skin ([place]) 10/07/2016  . Influenza A   . AKI (acute kidney injury) ([place]) 09/07/2016  . Acute encephalopathy 09/07/2016  . Acute respiratory failure with hypoxia ([place]) 09/07/2016  . Elevated lactic acid level 09/07/2016  . Elevated troponin 09/07/2016  . Coronary artery calcification seen on CT scan 10/28/2015  . Acute on chronic diastolic (congestive) heart failure ([place]) 09/26/2015  . OSA (obstructive sleep apnea) 02/07/2015  . Sepsis ([place]) 08/28/2014  . Chronic diastolic heart failure ([place]) 09/21/2013  . CVA (cerebral infarction) 08/08/2013  . Dizziness 08/07/2013  . TIA (transient ischemic attack) 08/07/2013  . Atrial fibrillation ([place]) 08/07/2013  . Hypertension 08/07/2013  . Anemia 08/07/2013  . Dyspnea 06/21/2013  .  Chest pain 11/16/2012  . Atrial fibrillation with RVR ([place]) 11/16/2012  . Gout 11/16/2012  . Diabetes mellitus type 2, uncontrolled, with complications ([place]) 85/09/7739  . CKD (chronic kidney disease), stage II 11/16/2012  . Atypical atrial flutter ([place]) 11/16/2012  . Pleuritic chest pain 11/16/2012  . Morbid obesity ([place]) 11/16/2012  . Obstructive sleep apnea 11/16/2012   Past Medical History:  Diagnosis Date  . Anal fissure    h/o - no recent complications  . Arthritis     hips  . Atrial fibrillation ([place])   . Diabetes mellitus without complication ([place])    improved after diet/exercise  . Enlarged prostate   . GERD (gastroesophageal reflux disease)   . Gout   . H/O hiatal hernia   . Hx of typhoid fever    as a child  . Hypertension    improved after diet/exercise  . Kidney stones   . Memory change 04/14/2017  . OSA (obstructive sleep apnea)    wears cpap  . PONV (postoperative nausea and vomiting)    also difficulty waking up  . Stroke [facility]) 08/2013   short term memory loss (slight)    Family History  Problem Relation Age of Onset  . COPD Father   . Heart attack Sister        died age 64 of MI    Past Surgical History:  Procedure Laterality Date  . carpel tunnel Bilateral   . CHOLECYSTECTOMY    . COLONOSCOPY    . EYE SURGERY     laser surgery   . I&D EXTREMITY Left 09/15/2014   Procedure: Excision Necrotic Left Achilles, Skin Graft, apply Wound VAC;  Surgeon: Newt Minion, MD;  Location: [facility];  Service: Orthopedics;  Laterality: Left;  . KIDNEY STONE SURGERY    . KNEE ARTHROSCOPY Bilateral   . LIGAMENT REPAIR    . TONSILLECTOMY    . VASECTOMY     Social History   Occupational History  . Occupation: Retired  Tobacco Use  . Smoking status: Never Smoker  . Smokeless tobacco: Never Used  Substance and Sexual Activity  . Alcohol use: Yes    Alcohol/week: 0.0 oz    Comment: rare - 1-2x /yr  . Drug use: No  . Sexual activity: Not on file

## 2017-08-14 ENCOUNTER — Encounter (INDEPENDENT_AMBULATORY_CARE_PROVIDER_SITE_OTHER): Payer: Self-pay | Admitting: Family

## 2017-08-14 ENCOUNTER — Ambulatory Visit (INDEPENDENT_AMBULATORY_CARE_PROVIDER_SITE_OTHER): Payer: Medicare Other | Admitting: Family

## 2017-08-14 DIAGNOSIS — L97411 Non-pressure chronic ulcer of right heel and midfoot limited to breakdown of skin: Secondary | ICD-10-CM | POA: Diagnosis not present

## 2017-08-14 NOTE — Progress Notes (Signed)
Office Visit Note   Patient: Oscar [name]           Date of Birth: February 11, 1946           MRN: [id number] Visit Date: 08/14/2017              Requested by: Josetta Huddle, MD [address] [place], [place] [address] PCP: Josetta Huddle, MD  Chief Complaint  Patient presents with  . Left Foot - Follow-up      HPI:  patient is a 72 year old gentleman seen in follow up for debridement of ulcer beneath the first metatarsal head left foot. He is currently wearing Medical compression stockings, Vive.  Assessment & Plan: Visit Diagnoses:  1. Non-pressure chronic ulcer of right heel and midfoot limited to breakdown of skin ([place])     Plan:  Continue Doxycycline. Daily Mupirocin dressings. Offload forefoot on left. Continue with the medical compression sock continue with the orthotics.  Follow-Up Instructions: Return if symptoms worsen or fail to improve.   Ortho Exam  Patient is alert, oriented, no adenopathy, well-dressed, normal affect, normal respiratory effort. Patient has an antalgic gait. On examination patient's foot shows callused ulceration, is debrided of nonviable tissue. Is 25 mm in diameter and 4 mm thick. Central ulceration is 7 mm in diameter and 2 mm deep post debridement. No exudative tissue. no cellulitis no drainage no odor no signs of infection.  Imaging: No results found. No images are attached to the encounter.  Labs: Lab Results  Component Value Date   HGBA1C 10.1 (H) 09/08/2016   HGBA1C 9.1 (H) 08/28/2014   HGBA1C 10.1 (H) 08/07/2013   ESRSEDRATE 75 (H) 08/28/2014   ESRSEDRATE 42 (H) 11/17/2012   CRP 12.9 (H) 08/28/2014   LABURIC 3.0 (L) 08/31/2014   REPTSTATUS 09/09/2016 FINAL 09/07/2016   CULT NO GROWTH 09/07/2016    Orders:  No orders of the defined types were placed in this encounter.  No orders of the defined types were placed in this encounter.    Procedures: No procedures performed  Clinical Data: No additional  findings.  ROS:  All other systems negative, except as noted in the HPI. Review of Systems  Constitutional: Negative for chills and fever.  Cardiovascular: Negative for leg swelling.  Skin: Positive for wound. Negative for color change.    Objective: Vital Signs: There were no vitals taken for this visit.  Specialty Comments:  No specialty comments available.  PMFS History: Patient Active Problem List   Diagnosis Date Noted  . Memory change 04/14/2017  . Chronic ulcer of great toe of left foot, limited to breakdown of skin ([place]) 04/09/2017  . Cutaneous abscess of left foot 04/09/2017  . Non-pressure chronic ulcer of right heel and midfoot limited to breakdown of skin ([place]) 10/07/2016  . Influenza A   . AKI (acute kidney injury) ([place]) 09/07/2016  . Acute encephalopathy 09/07/2016  . Acute respiratory failure with hypoxia ([place]) 09/07/2016  . Elevated lactic acid level 09/07/2016  . Elevated troponin 09/07/2016  . Coronary artery calcification seen on CT scan 10/28/2015  . Acute on chronic diastolic (congestive) heart failure ([place]) 09/26/2015  . OSA (obstructive sleep apnea) 02/07/2015  . Sepsis ([place]) 08/28/2014  . Chronic diastolic heart failure ([place]) 09/21/2013  . CVA (cerebral infarction) 08/08/2013  . Dizziness 08/07/2013  . TIA (transient ischemic attack) 08/07/2013  . Atrial fibrillation ([place]) 08/07/2013  . Hypertension 08/07/2013  . Anemia 08/07/2013  . Dyspnea 06/21/2013  . Chest pain 11/16/2012  .  Atrial fibrillation with RVR ([place]) 11/16/2012  . Gout 11/16/2012  . Diabetes mellitus type 2, uncontrolled, with complications ([place]) 36/62/9476  . CKD (chronic kidney disease), stage II 11/16/2012  . Atypical atrial flutter ([place]) 11/16/2012  . Pleuritic chest pain 11/16/2012  . Morbid obesity ([place]) 11/16/2012  . Obstructive sleep apnea 11/16/2012   Past Medical History:  Diagnosis Date  . Anal fissure    h/o - no recent complications  . Arthritis    hips  .  Atrial fibrillation ([place])   . Diabetes mellitus without complication ([place])    improved after diet/exercise  . Enlarged prostate   . GERD (gastroesophageal reflux disease)   . Gout   . H/O hiatal hernia   . Hx of typhoid fever    as a child  . Hypertension    improved after diet/exercise  . Kidney stones   . Memory change 04/14/2017  . OSA (obstructive sleep apnea)    wears cpap  . PONV (postoperative nausea and vomiting)    also difficulty waking up  . Stroke [facility]) 08/2013   short term memory loss (slight)    Family History  Problem Relation Age of Onset  . COPD Father   . Heart attack Sister        died age 63 of MI    Past Surgical History:  Procedure Laterality Date  . carpel tunnel Bilateral   . CHOLECYSTECTOMY    . COLONOSCOPY    . EYE SURGERY     laser surgery   . I&D EXTREMITY Left 09/15/2014   Procedure: Excision Necrotic Left Achilles, Skin Graft, apply Wound VAC;  Surgeon: Newt Minion, MD;  Location: [facility];  Service: Orthopedics;  Laterality: Left;  . KIDNEY STONE SURGERY    . KNEE ARTHROSCOPY Bilateral   . LIGAMENT REPAIR    . TONSILLECTOMY    . VASECTOMY     Social History   Occupational History  . Occupation: Retired  Tobacco Use  . Smoking status: Never Smoker  . Smokeless tobacco: Never Used  Substance and Sexual Activity  . Alcohol use: Yes    Alcohol/week: 0.0 oz    Comment: rare - 1-2x /yr  . Drug use: No  . Sexual activity: Not on file

## 2017-08-24 IMAGING — DX DG CHEST 2V
2 series · 3 of 3 positions shown · non-contrast
Comparison: 06/10/2010

CLINICAL DATA: Fall today with dizziness and lightheadedness.

EXAM:
CHEST  2 VIEW

[Series 2: chest lat · 0.14mm/px · 2 of 2 slices shown]
[im 1/2]
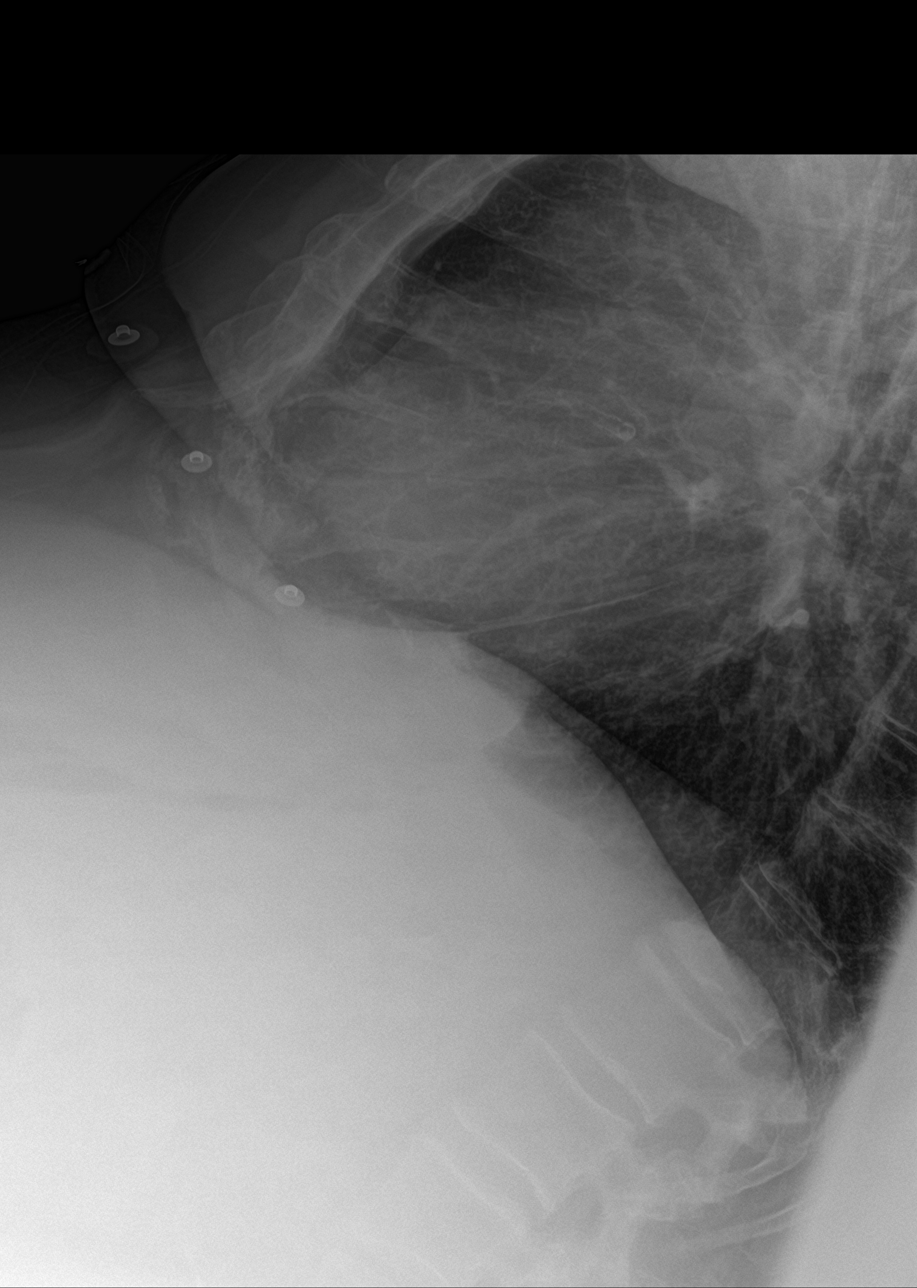
[im 2/2]
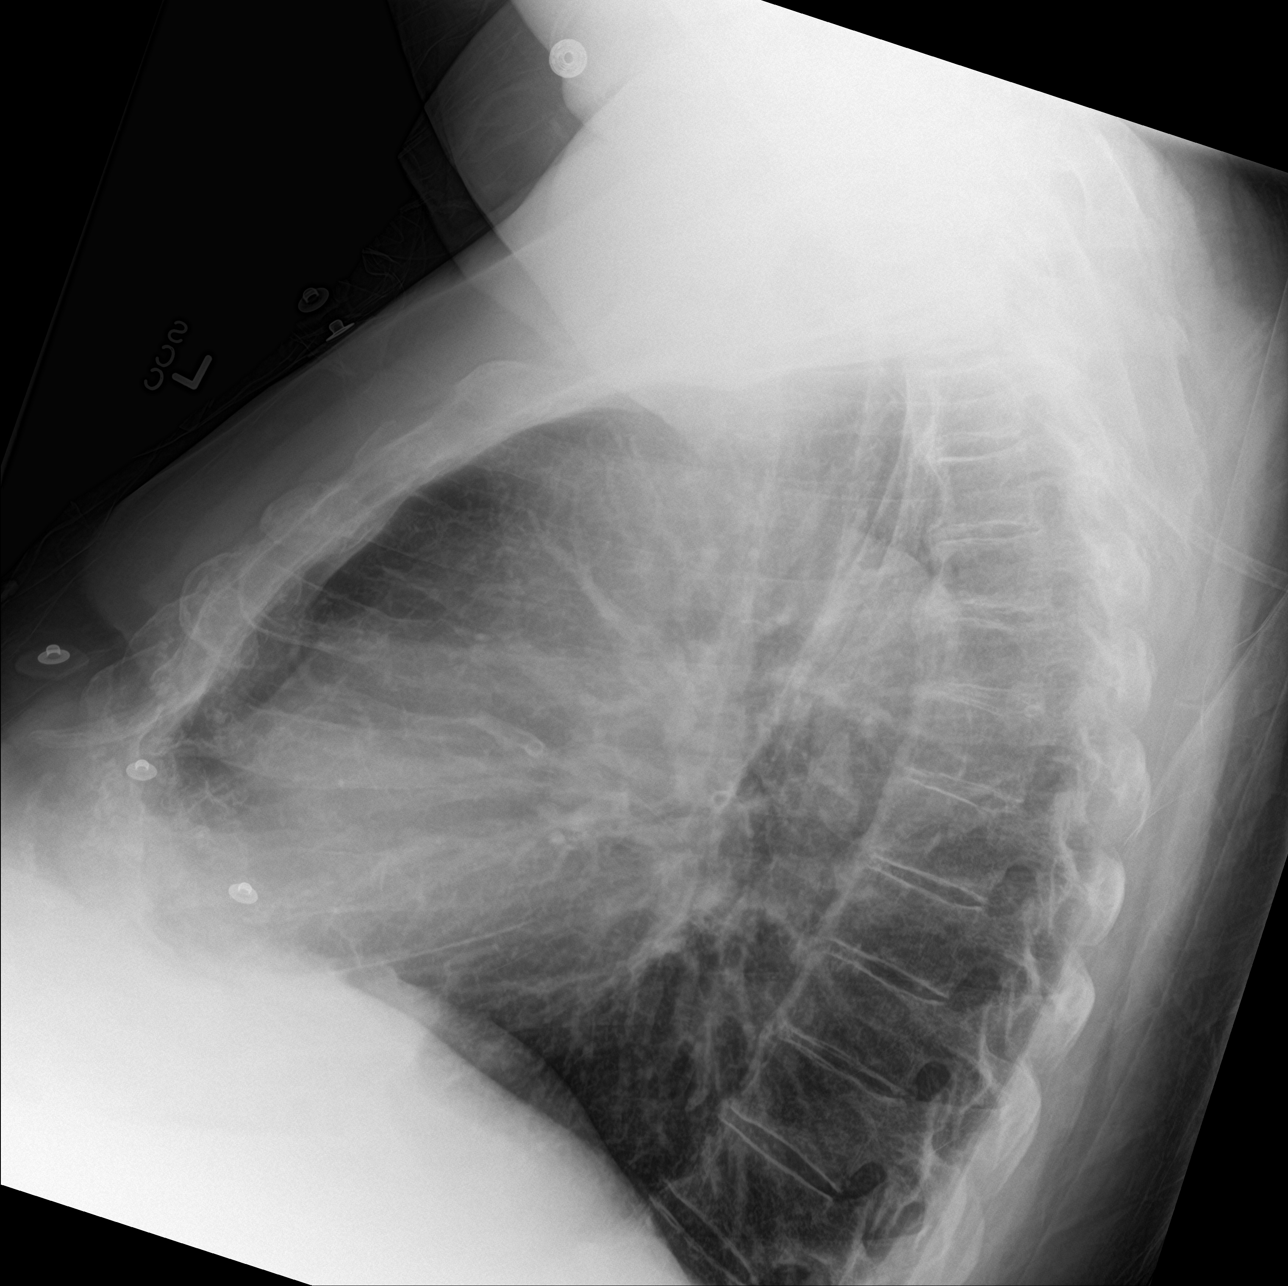

[chest ap]
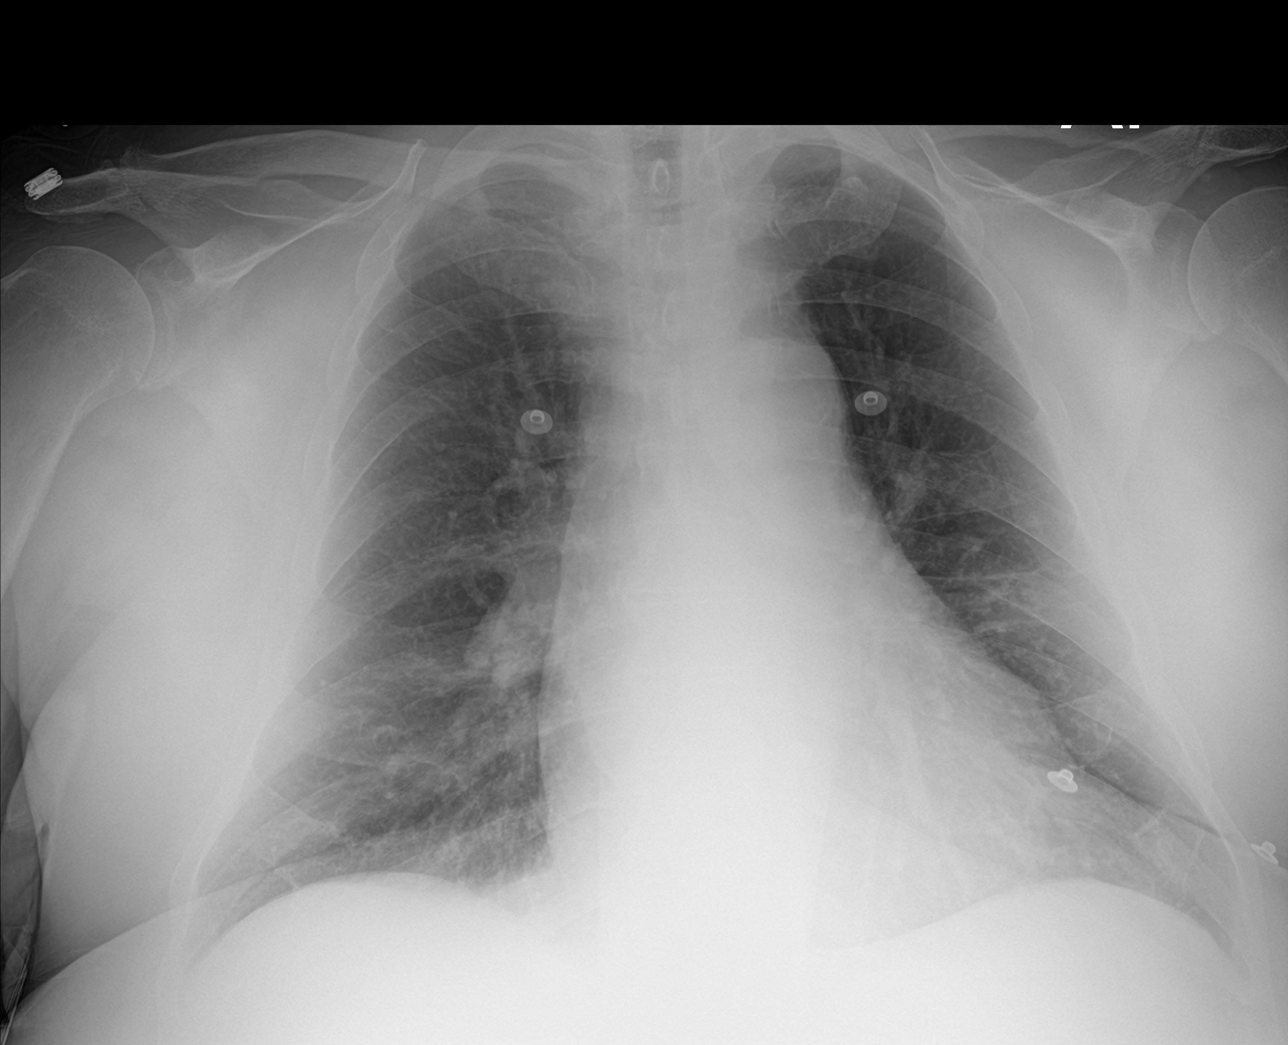

[3 of 3 positions shown; findings below may reference images not displayed]

FINDINGS: Lungs are adequately inflated without focal airspace consolidation
or effusion. There is mild stable cardiomegaly. The posterior mid to
upper lungs are cut off the lateral film. There mild degenerate
changes of the spine.
IMPRESSION: No active cardiopulmonary disease.

Mild cardiomegaly.

## 2017-08-24 IMAGING — CT CT CERVICAL SPINE W/O CM
4 of 8 series · 12 of 33 positions shown, 13 images · non-contrast
Comparison: Head CT 07/30/2013

CLINICAL DATA: Fall in bathroom heating head into tub. Some neck
pain.

EXAM:
CT HEAD WITHOUT CONTRAST
CT CERVICAL SPINE WITHOUT CONTRAST
TECHNIQUE: Multidetector CT imaging of the head and cervical spine was
performed following the standard protocol without intravenous
contrast. Multiplanar CT image reconstructions of the cervical spine
were also generated.

[Series 7: c_spine 2.0 st · axial · 0.31mm/px · z∈[-314,-256]mm · 2 of 89 slices shown, 3 images]
[im 30/89  soft-tissue]
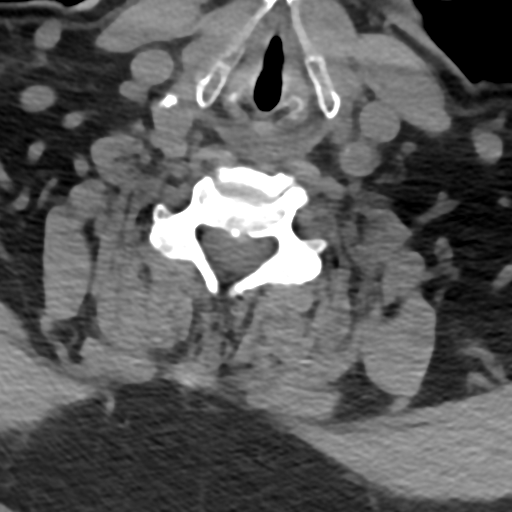
[im 30/89  bone]
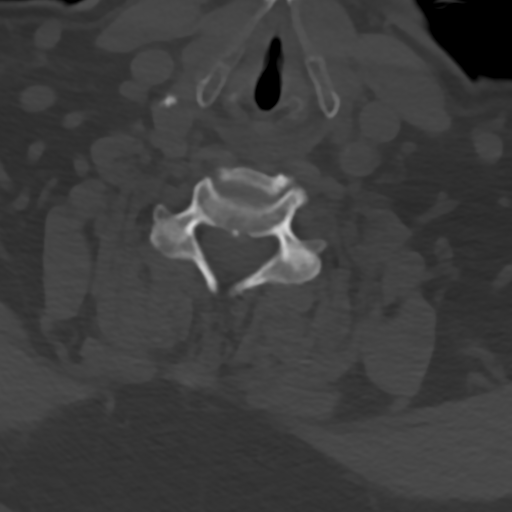
[im 59/89  bone]
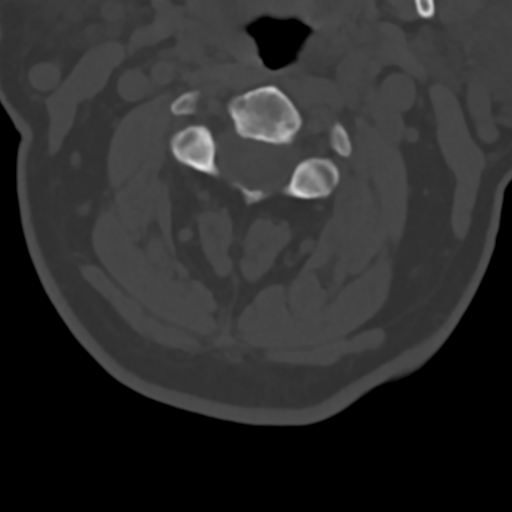

[Series 11: c_spine 2.0 sag bone · sagittal · 0.26mm/px · 5 of 66 slices shown]
[im 11/66  bone]
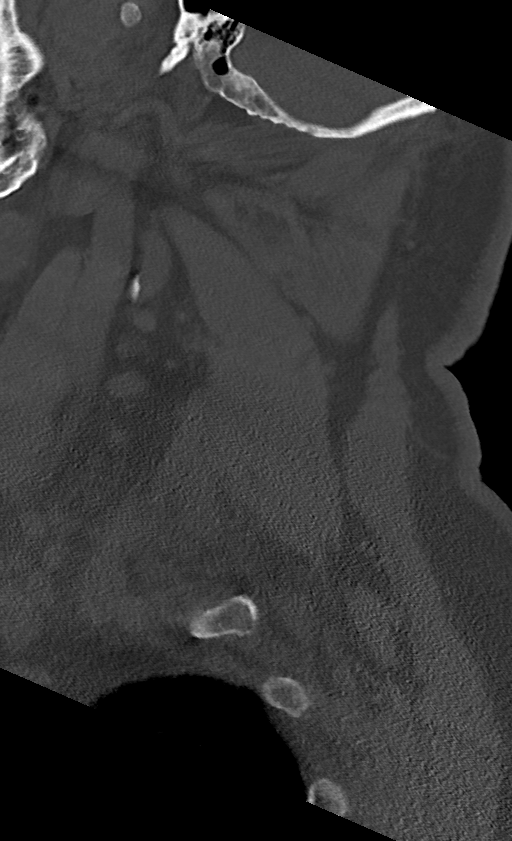
[im 22/66  bone]
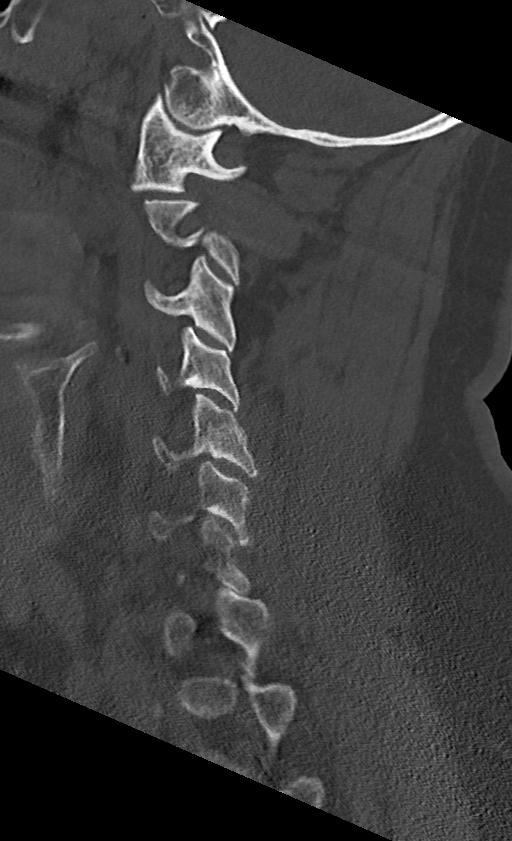
[im 33/66  bone]
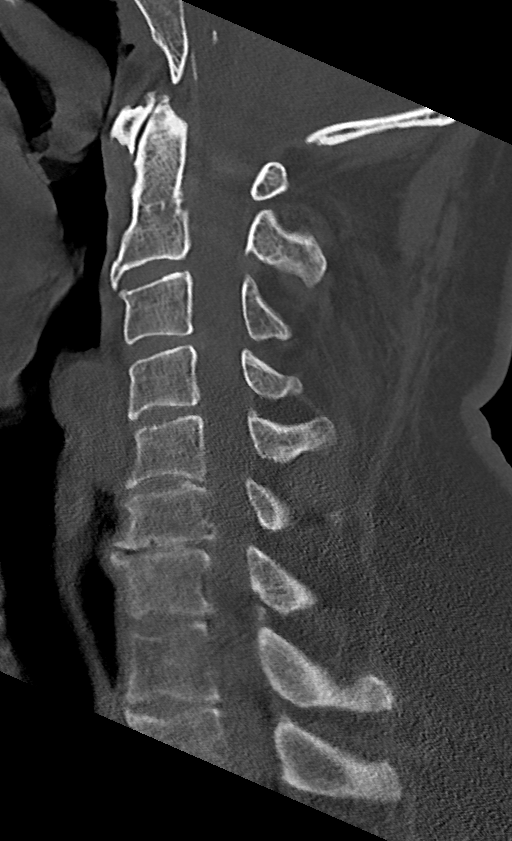
[im 44/66  bone]
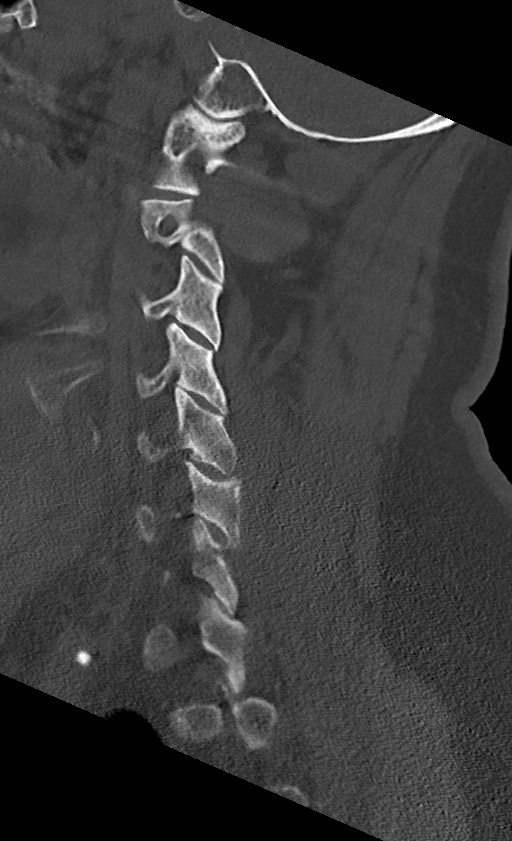
[im 55/66  bone]
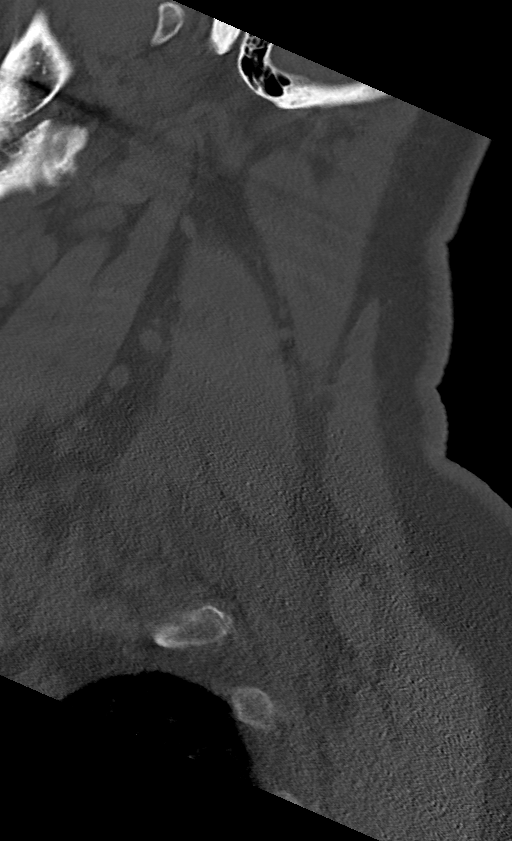

[Series 12: c_spine 2.0 cor bone · coronal · 0.26mm/px · 3 of 68 slices shown]
[im 17/68  bone]
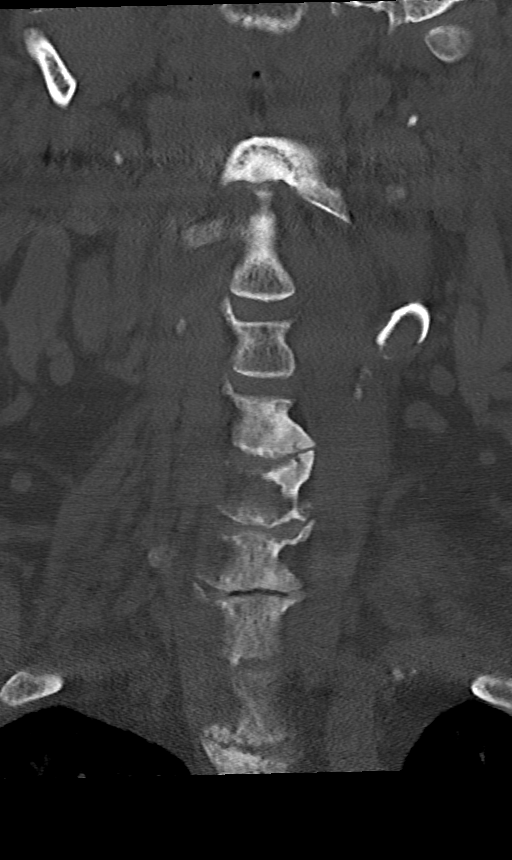
[im 34/68  bone]
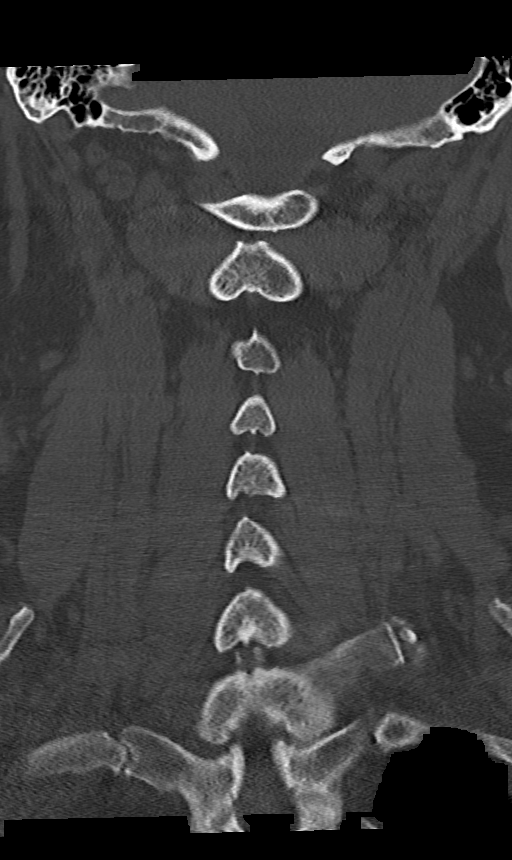
[im 51/68  bone]
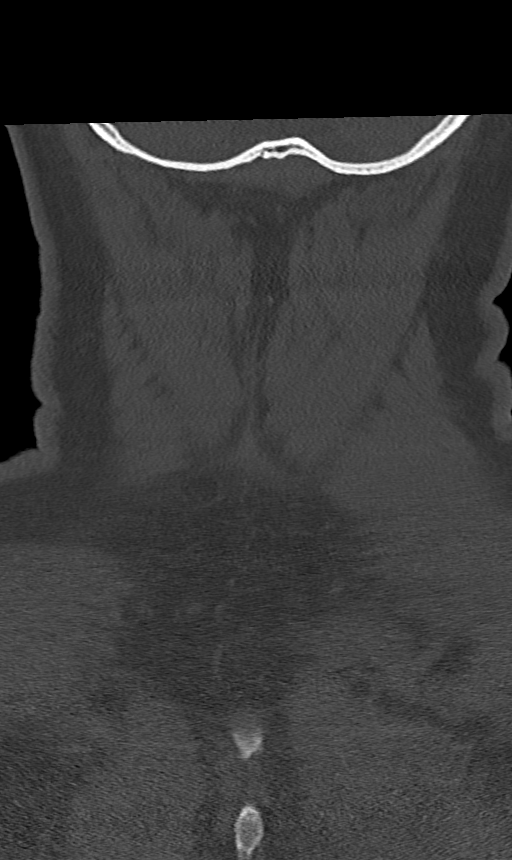

[Series 13: c_spine 2.0 orthogonals · axial · 0.21mm/px · z∈[-331,-278]mm · 2 of 92 slices shown]
[im 31/92  bone]
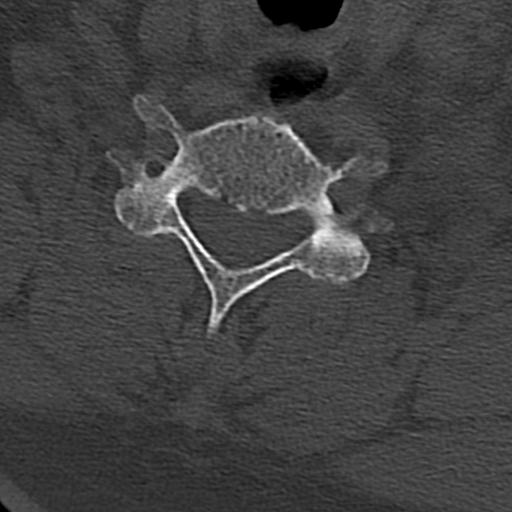
[im 61/92  bone]
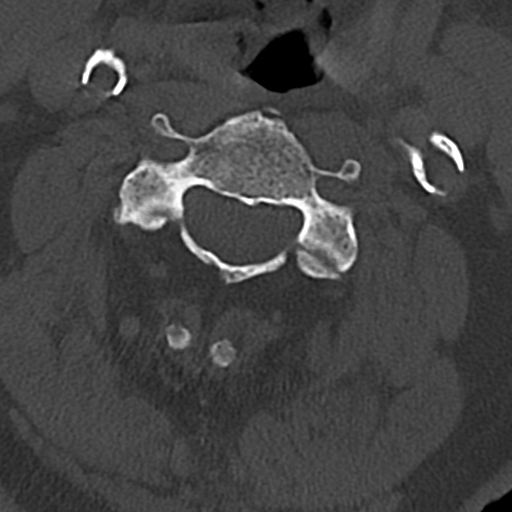

[12 of 33 positions shown; findings below may reference images not displayed]

FINDINGS: CT HEAD FINDINGS

Brain: Ventricles, cisterns and other CSF spaces are within normal.
There is chronic ischemic microvascular disease. There is no mass,
mass effect, shift of midline structures or acute hemorrhage. Small
old cerebellar infarcts. Old lacune infarct over the right thalamus.
No acute infarction.

Vascular: Calcified plaque over the cavernous segment of the
internal carotid arteries and vertebral arteries.

Skull: Within normal.

Sinuses/Orbits: Within normal.

Other: None.

CT CERVICAL SPINE FINDINGS

Alignment: Within normal.

Skull base and vertebrae: Mild spondylosis throughout the cervical
spine. Mild uncovertebral joint spurring is present as well as mild
facet arthropathy. Mild bilateral neural foraminal narrowing at
multiple levels due to adjacent bony spurring. No acute fracture.

Soft tissues and spinal canal: Prevertebral soft tissues are within
normal. Spinal canal within normal.

Disc levels: Disc space narrowing at the C6-7 level greater than the
C5-6 level.

Upper chest: Within normal.

Other: None.
IMPRESSION: No acute intracranial findings.

Chronic ischemic microvascular disease. Old lacune infarct over the
right thalamus and small old cerebellar infarcts.

No acute cervical spine injury.

Mild spondylosis of the cervical spine with disc disease at the C5-6
and C6-7 levels. Bilateral neural foramina narrowing at multiple
levels due to adjacent bony spurring.

## 2017-08-27 ENCOUNTER — Other Ambulatory Visit (HOSPITAL_COMMUNITY): Payer: Self-pay | Admitting: Internal Medicine

## 2017-08-31 ENCOUNTER — Encounter (INDEPENDENT_AMBULATORY_CARE_PROVIDER_SITE_OTHER): Payer: Medicare Other | Admitting: Ophthalmology

## 2017-08-31 DIAGNOSIS — H43813 Vitreous degeneration, bilateral: Secondary | ICD-10-CM | POA: Diagnosis not present

## 2017-08-31 DIAGNOSIS — E113312 Type 2 diabetes mellitus with moderate nonproliferative diabetic retinopathy with macular edema, left eye: Secondary | ICD-10-CM | POA: Diagnosis not present

## 2017-08-31 DIAGNOSIS — H35033 Hypertensive retinopathy, bilateral: Secondary | ICD-10-CM

## 2017-08-31 DIAGNOSIS — I10 Essential (primary) hypertension: Secondary | ICD-10-CM

## 2017-08-31 DIAGNOSIS — E113391 Type 2 diabetes mellitus with moderate nonproliferative diabetic retinopathy without macular edema, right eye: Secondary | ICD-10-CM

## 2017-08-31 DIAGNOSIS — E11311 Type 2 diabetes mellitus with unspecified diabetic retinopathy with macular edema: Secondary | ICD-10-CM | POA: Diagnosis not present

## 2017-09-09 DIAGNOSIS — J329 Chronic sinusitis, unspecified: Secondary | ICD-10-CM | POA: Diagnosis not present

## 2017-09-09 DIAGNOSIS — J209 Acute bronchitis, unspecified: Secondary | ICD-10-CM | POA: Diagnosis not present

## 2017-09-30 ENCOUNTER — Emergency Department (HOSPITAL_COMMUNITY)
Admission: EM | Admit: 2017-09-30 | Discharge: 2017-10-01 | Disposition: A | Discharge status: Home / Self Care | Payer: Medicare Other | Attending: Emergency Medicine | Admitting: Emergency Medicine

## 2017-09-30 ENCOUNTER — Emergency Department (HOSPITAL_COMMUNITY): Payer: Medicare Other

## 2017-09-30 ENCOUNTER — Encounter (HOSPITAL_COMMUNITY): Payer: Self-pay | Admitting: Family Medicine

## 2017-09-30 DIAGNOSIS — M7989 Other specified soft tissue disorders: Secondary | ICD-10-CM | POA: Diagnosis not present

## 2017-09-30 DIAGNOSIS — E119 Type 2 diabetes mellitus without complications: Secondary | ICD-10-CM | POA: Diagnosis not present

## 2017-09-30 DIAGNOSIS — Z9889 Other specified postprocedural states: Secondary | ICD-10-CM

## 2017-09-30 DIAGNOSIS — Z7984 Long term (current) use of oral hypoglycemic drugs: Secondary | ICD-10-CM | POA: Insufficient documentation

## 2017-09-30 DIAGNOSIS — I10 Essential (primary) hypertension: Secondary | ICD-10-CM | POA: Diagnosis not present

## 2017-09-30 DIAGNOSIS — S91311A Laceration without foreign body, right foot, initial encounter: Secondary | ICD-10-CM | POA: Diagnosis not present

## 2017-09-30 DIAGNOSIS — L02612 Cutaneous abscess of left foot: Secondary | ICD-10-CM | POA: Diagnosis present

## 2017-09-30 DIAGNOSIS — I4891 Unspecified atrial fibrillation: Secondary | ICD-10-CM | POA: Diagnosis not present

## 2017-09-30 DIAGNOSIS — Z79899 Other long term (current) drug therapy: Secondary | ICD-10-CM | POA: Insufficient documentation

## 2017-09-30 DIAGNOSIS — R2241 Localized swelling, mass and lump, right lower limb: Secondary | ICD-10-CM | POA: Diagnosis not present

## 2017-09-30 DIAGNOSIS — Z7901 Long term (current) use of anticoagulants: Secondary | ICD-10-CM | POA: Insufficient documentation

## 2017-09-30 DIAGNOSIS — L02611 Cutaneous abscess of right foot: Secondary | ICD-10-CM | POA: Diagnosis not present

## 2017-09-30 LAB — CBC WITH DIFFERENTIAL/PLATELET
Basophils Absolute: 0 10*3/uL (ref 0.0–0.1)
Basophils Relative: 0 %
Eosinophils Absolute: 0.2 10*3/uL (ref 0.0–0.7)
Eosinophils Relative: 3 %
HCT: 37.3 % — ABNORMAL LOW (ref 39.0–52.0)
Hemoglobin: 12.1 g/dL — ABNORMAL LOW (ref 13.0–17.0)
LYMPHS ABS: 1.6 10*3/uL (ref 0.7–4.0)
LYMPHS PCT: 18 %
MCH: 30 pg (ref 26.0–34.0)
MCHC: 32.4 g/dL (ref 30.0–36.0)
MCV: 92.3 fL (ref 78.0–100.0)
Monocytes Absolute: 0.7 10*3/uL (ref 0.1–1.0)
Monocytes Relative: 7 %
NEUTROS ABS: 6.6 10*3/uL (ref 1.7–7.7)
NEUTROS PCT: 72 %
Platelets: 219 10*3/uL (ref 150–400)
RBC: 4.04 MIL/uL — AB (ref 4.22–5.81)
RDW: 15.2 % (ref 11.5–15.5)
WBC: 9.1 10*3/uL (ref 4.0–10.5)

## 2017-09-30 MED ORDER — LIDOCAINE HCL (PF) 1 % IJ SOLN
10.0000 mL | Freq: Once | INTRAMUSCULAR | Status: AC
  Administered 2017-09-30: 10 mL
  Filled 2017-09-30: qty 30

## 2017-09-30 MED ORDER — SODIUM CHLORIDE 0.9 % IV SOLN: 2.0000 g | Freq: Once | INTRAVENOUS | Status: DC

## 2017-09-30 NOTE — ED Triage Notes (Signed)
Patient reports he has an abscess to his left foot that has a foul smell and bleeding. Patient is diabetic and reports he has had this abscess for a while.

## 2017-09-30 NOTE — ED Provider Notes (Signed)
[facility] DEPT Provider Note   CSN: [id number] Arrival date & time: 09/30/17  2136     History   Chief Complaint Chief Complaint  Patient presents with  . Abscess    HPI Oscar [name] is a 72 y.o. male who presents for evaluation of an abscess. The patient has had a recurring abscess of the left foot for about 1 year. He saw Dr. Sharol Given 1 month ago and had it drained by his PA. It improved abnd over the past 2-3 weeks it has enlarged and is draining blood. He has not had fever or chills. He has not had any abx. He denies pain but has decreased sensation. HE came to the ER tonight because it became malodorous and enlarging in size.   HPI  Past Medical History:  Diagnosis Date  . Anal fissure    h/o - no recent complications  . Arthritis    hips  . Atrial fibrillation ([place])   . Diabetes mellitus without complication ([place])    improved after diet/exercise  . Enlarged prostate   . GERD (gastroesophageal reflux disease)   . Gout   . H/O hiatal hernia   . Hx of typhoid fever    as a child  . Hypertension    improved after diet/exercise  . Kidney stones   . Memory change 04/14/2017  . OSA (obstructive sleep apnea)    wears cpap  . PONV (postoperative nausea and vomiting)    also difficulty waking up  . Stroke [facility]) 08/2013   short term memory loss (slight)    Patient Active Problem List   Diagnosis Date Noted  . Memory change 04/14/2017  . Chronic ulcer of great toe of left foot, limited to breakdown of skin ([place]) 04/09/2017  . Cutaneous abscess of left foot 04/09/2017  . Non-pressure chronic ulcer of right heel and midfoot limited to breakdown of skin ([place]) 10/07/2016  . Influenza A   . AKI (acute kidney injury) ([place]) 09/07/2016  . Acute encephalopathy 09/07/2016  . Acute respiratory failure with hypoxia ([place]) 09/07/2016  . Elevated lactic acid level 09/07/2016  . Elevated troponin 09/07/2016  . Coronary artery calcification seen on CT  scan 10/28/2015  . Acute on chronic diastolic (congestive) heart failure ([place]) 09/26/2015  . OSA (obstructive sleep apnea) 02/07/2015  . Sepsis ([place]) 08/28/2014  . Chronic diastolic heart failure ([place]) 09/21/2013  . CVA (cerebral infarction) 08/08/2013  . Dizziness 08/07/2013  . TIA (transient ischemic attack) 08/07/2013  . Atrial fibrillation ([place]) 08/07/2013  . Hypertension 08/07/2013  . Anemia 08/07/2013  . Dyspnea 06/21/2013  . Chest pain 11/16/2012  . Atrial fibrillation with RVR ([place]) 11/16/2012  . Gout 11/16/2012  . Diabetes mellitus type 2, uncontrolled, with complications ([place]) 70/35/0093  . CKD (chronic kidney disease), stage II 11/16/2012  . Atypical atrial flutter ([place]) 11/16/2012  . Pleuritic chest pain 11/16/2012  . Morbid obesity ([place]) 11/16/2012  . Obstructive sleep apnea 11/16/2012    Past Surgical History:  Procedure Laterality Date  . carpel tunnel Bilateral   . CHOLECYSTECTOMY    . COLONOSCOPY    . EYE SURGERY     laser surgery   . I&D EXTREMITY Left 09/15/2014   Procedure: Excision Necrotic Left Achilles, Skin Graft, apply Wound VAC;  Surgeon: Newt Minion, MD;  Location: [facility];  Service: Orthopedics;  Laterality: Left;  . KIDNEY STONE SURGERY    . KNEE ARTHROSCOPY Bilateral   . LIGAMENT REPAIR    .  TONSILLECTOMY    . VASECTOMY         Home Medications    Prior to Admission medications   Medication Sig Start Date End Date Taking? Authorizing Provider  acetaminophen (TYLENOL) 500 MG tablet Take 1,500 mg by mouth every 6 (six) hours as needed for moderate pain. Reported on 10/23/2015   Yes [provider]  apixaban (ELIQUIS) 5 MG TABS tablet Take 1 tablet (5 mg total) by mouth 2 (two) times daily. 10/20/16  Yes Bensimhon, Shaune Pascal, MD  diltiazem (CARDIZEM CD) 360 MG 24 hr capsule Take 1 capsule (360 mg total) by mouth daily. 02/20/14  Yes Evans Lance, MD  furosemide (LASIX) 80 MG tablet Take 1 tablet (80 mg total) by mouth daily. T 01/28/16   Yes Bensimhon, Shaune Pascal, MD  indomethacin (INDOCIN) 50 MG capsule Take 1 capsule (50 mg total) by mouth 3 (three) times daily as needed (for gout flare ups). Reported on 12/27/2015 05/11/17  Yes Dondra Prader R, NP  insulin aspart (NOVOLOG) 100 UNIT/ML injection Inject 50 Units into the skin 3 (three) times daily as needed for high blood sugar.    Yes [provider]  LEVEMIR 100 UNIT/ML injection Inject 50 Units into the skin at bedtime.  05/16/15  Yes [provider]  metformin (FORTAMET) 1000 MG (OSM) 24 hr tablet Take 1,000 mg by mouth 2 (two) times daily. 07/23/16  Yes [provider]  metolazone (ZAROXOLYN) 2.5 MG tablet Take 1 tablet every Wednesday. 11/07/16  Yes Bensimhon, Shaune Pascal, MD  mupirocin ointment (BACTROBAN) 2 % Apply 1 application topically 2 (two) times daily. Apply to the affected area 2 times a day 10/07/16  Yes Newt Minion, MD  pantoprazole (PROTONIX) 40 MG tablet Take 1 tablet (40 mg total) by mouth daily. 08/08/13  Yes Josetta Huddle, MD  potassium chloride SA (K-DUR,KLOR-CON) 20 MEQ tablet Take 2 tablets (40 mEq total) by mouth daily. 08/25/16  Yes Bensimhon, Shaune Pascal, MD  tamsulosin (FLOMAX) 0.4 MG CAPS capsule Take 1 capsule (0.4 mg total) by mouth daily after breakfast. Patient taking differently: Take 0.4 mg by mouth 2 (two) times daily.  08/08/13  Yes Josetta Huddle, MD  ALPRAZolam Duanne Moron) 0.5 MG tablet Take 2 tablets approximately 45 minutes prior to the MRI study, take a third tablet if needed. Patient not taking: Reported on 09/30/2017 04/14/17   Kathrynn Ducking, MD  B-D INS SYRINGE 0.5CC/30GX1/2" 30G X 1/2" 0.5 ML MISC USE THREE TIMES A DAY DX-E11.65 INJECTION 04/01/17   [provider]  blood glucose meter kit and supplies KIT Dispense based on patient and insurance preference. Use up to four times daily as directed. (FOR ICD-9 250.00, 250.01). 09/03/14   Theodis Blaze, MD  buPROPion [facility] SR) 150 MG 12 hr tablet Take 150 mg by  mouth daily. 09/12/17   [provider]  doxycycline (VIBRA-TABS) 100 MG tablet TAKE 1 TABLET BY MOUTH TWICE A DAY Patient not taking: Reported on 09/30/2017 05/06/17   Suzan Slick, NP  Febuxostat (ULORIC) 80 MG TABS Take 80 mg by mouth daily.    [provider]  glyBURIDE (DIABETA) 5 MG tablet  10/06/16   [provider]  guaiFENesin-dextromethorphan (ROBITUSSIN DM) 100-10 MG/5ML syrup Take 5 mLs by mouth every 4 (four) hours as needed for cough. 09/10/16   Theodis Blaze, MD  indomethacin (INDOCIN) 50 MG capsule Take 1 capsule (50 mg total) by mouth 3 (three) times daily as needed (for gout flare  ups). Take 1 capsule by mouth 3 times daily as needed for gout flares Patient not taking: Reported on 09/30/2017 05/11/17   Suzan Slick, NP  indomethacin (INDOCIN) 50 MG capsule Take 1 capsule (50 mg total) by mouth 2 (two) times daily with a meal. Patient not taking: Reported on 09/30/2017 05/11/17   Suzan Slick, NP  potassium chloride SA (KLOR-CON M20) 20 MEQ tablet Take 2 tablets (40 mEq total) by mouth daily. 08/27/17   Bensimhon, Shaune Pascal, MD    Family History Family History  Problem Relation Age of Onset  . COPD Father   . Heart attack Sister        died age 42 of MI    Social History Social History   Tobacco Use  . Smoking status: Never Smoker  . Smokeless tobacco: Never Used  Substance Use Topics  . Alcohol use: Yes    Alcohol/week: 0.0 oz    Comment: rare - 1-2x /yr  . Drug use: No     Allergies   Iohexol and Ivp dye [iodinated diagnostic agents]   Review of Systems Review of Systems  Ten systems reviewed and are negative for acute change, except as noted in the HPI.   Physical Exam Updated Vital Signs BP (!) 149/81 (BP Location: Right Arm)   Pulse 90   Temp 97.6 F (36.4 C) (Oral)   Resp 18   SpO2 93%   Physical Exam  Constitutional: He appears well-developed and well-nourished. No distress.  HENT:  Head: Normocephalic and  atraumatic.  Eyes: Conjunctivae are normal. No scleral icterus.  Neck: Normal range of motion. Neck supple.  Cardiovascular: Normal rate, regular rhythm and normal heart sounds.  Pulmonary/Chest: Effort normal and breath sounds normal. No respiratory distress.  Abdominal: Soft. There is no tenderness.  Musculoskeletal: He exhibits no edema, tenderness or deformity.  Well-circumscribed abscess of the dorsal surface of the left metatarsophalangeal joint.  There is a foul odor present.  Minimal tenderness.  No surrounding cellulitis.  Strong DP and PT pulses present.  Neurological: He is alert.  Skin: Skin is warm and dry. He is not diaphoretic.  Psychiatric: His behavior is normal.  Nursing note and vitals reviewed.        ED Treatments / Results  Labs (all labs ordered are listed, but only abnormal results are displayed) Labs Reviewed - No data to display  EKG  EKG Interpretation None      INCISION AND DRAINAGE Performed by: Margarita Mail Consent: Verbal consent obtained. Risks and benefits: risks, benefits and alternatives were discussed Type: abscess  Body area: Foot  Anesthesia: local infiltration  Incision was made with a scalpel.  Local anesthetic: lidocaine 1% w/o epinephrine  Anesthetic total: 5 ml  Complexity: complex Blunt dissection to break up loculations  Drainage:bloody   Patient tolerance: Patient tolerated the procedure well with no immediate complications.    LACERATION REPAIR Performed by: Margarita Mail Authorized by: Margarita Mail Consent: Verbal consent obtained. Risks and benefits: risks, benefits and alternatives were discussed Consent given by: patient Patient identity confirmed: provided demographic data Prepped and Draped in normal sterile fashion Wound explored  Laceration Location: left foot   Laceration Length: 2 cm  Irrigation method: syringe Amount of cleaning: standard  Skin closure: 4.0 chromic gut  Number of  sutures: 1  Technique: HM  Patient tolerance: Patient tolerated the procedure well with no immediate complications.   Radiology No results found.  Procedures Procedures (including critical care time)  Medications  Ordered in ED Medications - No data to display   Initial Impression / Assessment and Plan / ED Course  I have reviewed the triage vital signs and the nursing notes.  Pertinent labs & imaging results that were available during my care of the patient were reviewed by me and considered in my medical decision making (see chart for details).  Clinical Course as of Oct 01 621  Thu Oct 01, 2017  0050 Patient sugar markedly elevated without anion gap Glucose: (!) 368 [AH]    Clinical Course User Index [AH] Margarita Mail, PA-C    Patient appeared to have a large abscess however incision and drainage proved that the area did not contain any purulence but was otherwise a very firm mass with what appeared to be verrucous tissue internally.  I suspect this is a large plantar wart.  Because of a large elliptical incision made on the lateral side in the area that appeared to be purulent, there was significant bleeding and I had to place a horizontal mattress suture to tampon on the area.  This is likely secondary to the fact that there was no abscess and the patient is on Eliquis.  Also given the fact that the patient is diabetic I have concern for potential for infection and will place the patient on Keflex.  Patient was given close follow-up with Dr. Sharol Given I have inbox Dr. Sharol Given to ask him to see the patient.  His bleeding was controlled prior to discharge after pressure dressing placed for about 20 minutes.  He was then put in an orthopedic shoe at discharge.  I discussed return precautions with the patient to include return for uncontrolled bleeding, signs of infection to include fevers, chills.  He appears otherwise appropriate for discharge at this time.  Final Clinical  Impressions(s) / ED Diagnoses   Final diagnoses:  Foot mass, right  Status post incision and drainage    ED Discharge Orders    None       Margarita Mail, PA-C 10/01/17 0211    Dorie Rank, MD 10/01/17 [phone]

## 2017-10-01 ENCOUNTER — Other Ambulatory Visit (INDEPENDENT_AMBULATORY_CARE_PROVIDER_SITE_OTHER): Payer: Self-pay | Admitting: Family

## 2017-10-01 ENCOUNTER — Other Ambulatory Visit: Payer: Self-pay

## 2017-10-01 ENCOUNTER — Encounter (INDEPENDENT_AMBULATORY_CARE_PROVIDER_SITE_OTHER): Payer: Self-pay | Admitting: Orthopedic Surgery

## 2017-10-01 ENCOUNTER — Ambulatory Visit (INDEPENDENT_AMBULATORY_CARE_PROVIDER_SITE_OTHER): Payer: Medicare Other | Admitting: Orthopedic Surgery

## 2017-10-01 ENCOUNTER — Encounter (HOSPITAL_COMMUNITY): Payer: Self-pay | Admitting: *Deleted

## 2017-10-01 VITALS — Ht 69.0 in | Wt 303.0 lb

## 2017-10-01 DIAGNOSIS — M869 Osteomyelitis, unspecified: Secondary | ICD-10-CM | POA: Diagnosis not present

## 2017-10-01 DIAGNOSIS — R2241 Localized swelling, mass and lump, right lower limb: Secondary | ICD-10-CM | POA: Diagnosis not present

## 2017-10-01 LAB — BASIC METABOLIC PANEL
ANION GAP: 15 (ref 5–15)
BUN: 37 mg/dL — ABNORMAL HIGH (ref 6–20)
CALCIUM: 9.5 mg/dL (ref 8.9–10.3)
CO2: 28 mmol/L (ref 22–32)
Chloride: 95 mmol/L — ABNORMAL LOW (ref 101–111)
Creatinine, Ser: 1.6 mg/dL — ABNORMAL HIGH (ref 0.61–1.24)
GFR calc non Af Amer: 41 mL/min — ABNORMAL LOW (ref >60)
GFR, EST AFRICAN AMERICAN: 48 mL/min — AB (ref >60)
Glucose, Bld: 368 mg/dL — ABNORMAL HIGH (ref 65–99)
Potassium: 3.4 mmol/L — ABNORMAL LOW (ref 3.5–5.1)
Sodium: 138 mmol/L (ref 135–145)

## 2017-10-01 MED ORDER — CEPHALEXIN 500 MG PO CAPS
ORAL_CAPSULE | ORAL | 0 refills | Status: DC
Start: 2017-10-01 — End: 2017-10-02

## 2017-10-01 NOTE — Discharge Instructions (Signed)
Please keep your bandage on for 24 hours. Return sooner if you have uncontrolled bleeding at home. I believe you have a plantar wart on the bottom of your foot. Please make an appointment to be seen by Dr. Amalia Hailey for removal.  Follow up closely with Dr. Sharol Given in the next 24-48 hours.

## 2017-10-01 NOTE — Progress Notes (Signed)
Office Visit Note   Patient: Oscar [name]           Date of Birth: May 20, 1946           MRN: [id number] Visit Date: 10/01/2017              Requested by: Josetta Huddle, MD [address] [place], [place] [address] PCP: Josetta Huddle, MD  Chief Complaint  Patient presents with  . Left Foot - Pain    Abscess treated in ER last night        HPI: Patient is a 72 year old gentleman with diabetic insensate neuropathy with a [name] of the left foot great toe first metatarsal head.  He was in the emergency room at [facility] last night a incision was made to decompress the wound patient is seen today urgently for follow-up evaluation of the necrotic ulcer.  Assessment & Plan: Visit Diagnoses:  1. Osteomyelitis of toe of left foot ([place])     Plan: Patient has exposed bone through the ulcer he has osteomyelitis with odor swelling and drainage.  We will need to proceed with a first ray amputation.  We will set this up for tomorrow.  Discussed the importance of protected weightbearing discussed that his cane would not be sufficient he would need to use a walker and a wheelchair.  Risk of too much pressure and wound dehiscence and need for additional amputation was discussed.  Patient states he understands and wishes to proceed at this time.  Follow-Up Instructions: Return in about 2 weeks (around 10/15/2017).   Ortho Exam  Patient is alert, oriented, no adenopathy, well-dressed, normal affect, normal respiratory effort. Examination patient has a palpable dorsalis pedis pulse he has a large foul-smelling fungating necrotic ulcer beneath the first metatarsal head of the right foot.  There is a incision lateral to the ulcer which goes through the dermis but is no deep or there are stitches in this incision.  After informed consent a 10 blade knife was used to debride the massive amount of necrotic tissue from the ulcer.  After debridement the ulcer is 4 cm in diameter  and 2 cm deep this probes all the way down to the metatarsal head.  There is foul-smelling drainage.  Patient has a [name].  There is no ascending cellulitis.  Imaging: Dg Foot Complete Left  Result Date: 09/30/2017 CLINICAL DATA:  Abscess along the plantar aspect of the foot base of the great toe. EXAM: LEFT FOOT - COMPLETE 3+ VIEW COMPARISON:  None. FINDINGS: Focal soft tissue swelling with superficial ulceration is noted compatible with reported history of a soft tissue ulcer. No underlying bone destruction is seen. There is mild osteoarthritic change of the toes. Joint dislocation. Vascular calcifications are present. IMPRESSION: Focal soft tissue swelling the plantar aspect of the great toe without underlying destruction. Differential considerations may include focal soft tissue abscess or possibly gout among some possibilities given patient medical history. Electronically Signed   By: Ashley Royalty M.D.   On: 09/30/2017 23:22   No images are attached to the encounter.  Labs: Lab Results  Component Value Date   HGBA1C 10.1 (H) 09/08/2016   HGBA1C 9.1 (H) 08/28/2014   HGBA1C 10.1 (H) 08/07/2013   ESRSEDRATE 75 (H) 08/28/2014   ESRSEDRATE 42 (H) 11/17/2012   CRP 12.9 (H) 08/28/2014   LABURIC 3.0 (L) 08/31/2014   REPTSTATUS PENDING 09/30/2017   GRAMSTAIN  09/30/2017    RARE  WBC PRESENT, PREDOMINANTLY MONONUCLEAR NO ORGANISMS SEEN Performed at [facility] [address], [place], [place] [address]    CULT PENDING 09/30/2017    @LABSALLVALUES (HGBA1)@  Body mass index is 44.75 kg/m.  Orders:  No orders of the defined types were placed in this encounter.  No orders of the defined types were placed in this encounter.    Procedures: No procedures performed  Clinical Data: No additional findings.  ROS:  All other systems negative, except as noted in the HPI. Review of Systems  Objective: Vital Signs: Ht 5\' 9"  (1.753 m)   Wt (!) 303 lb (137.4 kg)    BMI 44.75 kg/m   Specialty Comments:  No specialty comments available.  PMFS History: Patient Active Problem List   Diagnosis Date Noted  . Memory change 04/14/2017  . Chronic ulcer of great toe of left foot, limited to breakdown of skin ([place]) 04/09/2017  . Cutaneous abscess of left foot 04/09/2017  . Non-pressure chronic ulcer of right heel and midfoot limited to breakdown of skin ([place]) 10/07/2016  . Influenza A   . AKI (acute kidney injury) ([place]) 09/07/2016  . Acute encephalopathy 09/07/2016  . Acute respiratory failure with hypoxia ([place]) 09/07/2016  . Elevated lactic acid level 09/07/2016  . Elevated troponin 09/07/2016  . Coronary artery calcification seen on CT scan 10/28/2015  . Acute on chronic diastolic (congestive) heart failure ([place]) 09/26/2015  . OSA (obstructive sleep apnea) 02/07/2015  . Sepsis ([place]) 08/28/2014  . Chronic diastolic heart failure ([place]) 09/21/2013  . CVA (cerebral infarction) 08/08/2013  . Dizziness 08/07/2013  . TIA (transient ischemic attack) 08/07/2013  . Atrial fibrillation ([place]) 08/07/2013  . Hypertension 08/07/2013  . Anemia 08/07/2013  . Dyspnea 06/21/2013  . Chest pain 11/16/2012  . Atrial fibrillation with RVR ([place]) 11/16/2012  . Gout 11/16/2012  . Diabetes mellitus type 2, uncontrolled, with complications ([place]) 60/45/4098  . CKD (chronic kidney disease), stage II 11/16/2012  . Atypical atrial flutter ([place]) 11/16/2012  . Pleuritic chest pain 11/16/2012  . Morbid obesity ([place]) 11/16/2012  . Obstructive sleep apnea 11/16/2012   Past Medical History:  Diagnosis Date  . Anal fissure    h/o - no recent complications  . Arthritis    hips  . Atrial fibrillation ([place])   . Diabetes mellitus without complication ([place])    improved after diet/exercise  . Enlarged prostate   . GERD (gastroesophageal reflux disease)   . Gout   . H/O hiatal hernia   . Hx of typhoid fever    as a child  . Hypertension    improved after diet/exercise  .  Kidney stones   . Memory change 04/14/2017  . OSA (obstructive sleep apnea)    wears cpap  . PONV (postoperative nausea and vomiting)    also difficulty waking up  . Stroke [facility]) 08/2013   short term memory loss (slight)    Family History  Problem Relation Age of Onset  . COPD Father   . Heart attack Sister        died age 86 of MI    Past Surgical History:  Procedure Laterality Date  . carpel tunnel Bilateral   . CHOLECYSTECTOMY    . COLONOSCOPY    . EYE SURGERY     laser surgery   . I&D EXTREMITY Left 09/15/2014   Procedure: Excision Necrotic Left Achilles, Skin Graft, apply Wound VAC;  Surgeon: Newt Minion, MD;  Location: [facility];  Service: Orthopedics;  Laterality:  Left;  . KIDNEY STONE SURGERY    . KNEE ARTHROSCOPY Bilateral   . LIGAMENT REPAIR    . TONSILLECTOMY    . VASECTOMY     Social History   Occupational History  . Occupation: Retired  Tobacco Use  . Smoking status: Never Smoker  . Smokeless tobacco: Never Used  Substance and Sexual Activity  . Alcohol use: Yes    Alcohol/week: 0.0 oz    Comment: rare - 1-2x /yr  . Drug use: No  . Sexual activity: Not on file

## 2017-10-01 NOTE — ED Provider Notes (Signed)
Medical screening examination/treatment/procedure(s) were conducted as a shared visit with non-physician practitioner(s) and myself.  I personally evaluated the patient during the encounter.  Hypertrophic appearing mass overlying plantar surface of left first metatarsophalangeal joint.    Shanon Rosser, MD 10/01/17 2242

## 2017-10-01 NOTE — Progress Notes (Signed)
Pt SDW-Pre-op call completed by pt daughter [name], Therapist, sports [facility]). [name] denies any acute cardiopulmonary issues but stated that pt is under the care of Dr. Haroldine Laws, Cardiology. [name] denies that pt had a cardiac cath. Sandy made aware to have pt take 1/2 dose of Levemir insulin tonight (25 units) and no insulin DOS, no Metformin DOS and no Glyburide tonight and DOS. [name]  made aware to have pt check BG every 2 hours prior to arrival to hospital on DOS. Pt made aware to treat a BG < 70 with 4 glucose tabs or glucose gel or 4 ounces of apple or cranberry juice, wait 15 minutes after intervention to recheck BG, if BG remains < 70, call Short Stay unit to speak with a nurse. Delorise Shiner will have pt bring Eliquis on DOS; pt was not instructed to stop Eliquis. [name] denies that pt had a chest x ray and EKG within the last year. Sandy verbalized understanding of all pre-op instructions.

## 2017-10-02 ENCOUNTER — Encounter (HOSPITAL_COMMUNITY): Payer: Self-pay | Admitting: *Deleted

## 2017-10-02 ENCOUNTER — Ambulatory Visit (HOSPITAL_COMMUNITY): Payer: Medicare Other | Admitting: Anesthesiology

## 2017-10-02 ENCOUNTER — Ambulatory Visit (HOSPITAL_COMMUNITY)
Admission: RE | Admit: 2017-10-02 | Discharge: 2017-10-02 | Disposition: A | Discharge status: Home / Self Care | Payer: Medicare Other | Source: Ambulatory Visit | Attending: Orthopedic Surgery | Admitting: Orthopedic Surgery

## 2017-10-02 ENCOUNTER — Encounter (HOSPITAL_COMMUNITY)
Admission: RE | Disposition: A | Discharge status: Home / Self Care | Payer: Self-pay | Source: Ambulatory Visit | Attending: Orthopedic Surgery

## 2017-10-02 DIAGNOSIS — M868X7 Other osteomyelitis, ankle and foot: Secondary | ICD-10-CM | POA: Insufficient documentation

## 2017-10-02 DIAGNOSIS — Z888 Allergy status to other drugs, medicaments and biological substances status: Secondary | ICD-10-CM | POA: Insufficient documentation

## 2017-10-02 DIAGNOSIS — G4733 Obstructive sleep apnea (adult) (pediatric): Secondary | ICD-10-CM | POA: Insufficient documentation

## 2017-10-02 DIAGNOSIS — Z91041 Radiographic dye allergy status: Secondary | ICD-10-CM | POA: Insufficient documentation

## 2017-10-02 DIAGNOSIS — M160 Bilateral primary osteoarthritis of hip: Secondary | ICD-10-CM | POA: Insufficient documentation

## 2017-10-02 DIAGNOSIS — M86672 Other chronic osteomyelitis, left ankle and foot: Secondary | ICD-10-CM | POA: Diagnosis not present

## 2017-10-02 DIAGNOSIS — Z794 Long term (current) use of insulin: Secondary | ICD-10-CM | POA: Insufficient documentation

## 2017-10-02 DIAGNOSIS — Z8673 Personal history of transient ischemic attack (TIA), and cerebral infarction without residual deficits: Secondary | ICD-10-CM | POA: Insufficient documentation

## 2017-10-02 DIAGNOSIS — L97529 Non-pressure chronic ulcer of other part of left foot with unspecified severity: Secondary | ICD-10-CM | POA: Diagnosis not present

## 2017-10-02 DIAGNOSIS — E1140 Type 2 diabetes mellitus with diabetic neuropathy, unspecified: Secondary | ICD-10-CM | POA: Insufficient documentation

## 2017-10-02 DIAGNOSIS — M869 Osteomyelitis, unspecified: Secondary | ICD-10-CM | POA: Diagnosis not present

## 2017-10-02 DIAGNOSIS — Z87442 Personal history of urinary calculi: Secondary | ICD-10-CM | POA: Diagnosis not present

## 2017-10-02 DIAGNOSIS — F329 Major depressive disorder, single episode, unspecified: Secondary | ICD-10-CM | POA: Diagnosis not present

## 2017-10-02 DIAGNOSIS — E1169 Type 2 diabetes mellitus with other specified complication: Secondary | ICD-10-CM | POA: Diagnosis not present

## 2017-10-02 DIAGNOSIS — I10 Essential (primary) hypertension: Secondary | ICD-10-CM | POA: Insufficient documentation

## 2017-10-02 DIAGNOSIS — Z9989 Dependence on other enabling machines and devices: Secondary | ICD-10-CM | POA: Diagnosis not present

## 2017-10-02 DIAGNOSIS — N400 Enlarged prostate without lower urinary tract symptoms: Secondary | ICD-10-CM | POA: Diagnosis not present

## 2017-10-02 DIAGNOSIS — I130 Hypertensive heart and chronic kidney disease with heart failure and stage 1 through stage 4 chronic kidney disease, or unspecified chronic kidney disease: Secondary | ICD-10-CM | POA: Diagnosis not present

## 2017-10-02 DIAGNOSIS — I4891 Unspecified atrial fibrillation: Secondary | ICD-10-CM | POA: Insufficient documentation

## 2017-10-02 DIAGNOSIS — K219 Gastro-esophageal reflux disease without esophagitis: Secondary | ICD-10-CM | POA: Diagnosis not present

## 2017-10-02 DIAGNOSIS — E11621 Type 2 diabetes mellitus with foot ulcer: Secondary | ICD-10-CM | POA: Diagnosis not present

## 2017-10-02 DIAGNOSIS — Z8249 Family history of ischemic heart disease and other diseases of the circulatory system: Secondary | ICD-10-CM | POA: Insufficient documentation

## 2017-10-02 DIAGNOSIS — M86272 Subacute osteomyelitis, left ankle and foot: Secondary | ICD-10-CM

## 2017-10-02 DIAGNOSIS — I5032 Chronic diastolic (congestive) heart failure: Secondary | ICD-10-CM | POA: Diagnosis not present

## 2017-10-02 DIAGNOSIS — N183 Chronic kidney disease, stage 3 (moderate): Secondary | ICD-10-CM | POA: Diagnosis not present

## 2017-10-02 HISTORY — PX: AMPUTATION: SHX166

## 2017-10-02 HISTORY — DX: Depression, unspecified: F32.A

## 2017-10-02 HISTORY — DX: Major depressive disorder, single episode, unspecified: F32.9

## 2017-10-02 HISTORY — DX: Pneumonia, unspecified organism: J18.9

## 2017-10-02 LAB — GLUCOSE, CAPILLARY
GLUCOSE-CAPILLARY: 297 mg/dL — AB (ref 65–99)
GLUCOSE-CAPILLARY: 291 mg/dL — AB (ref 65–99)

## 2017-10-02 LAB — PROTIME-INR
INR: 1.32
Prothrombin Time: 16.3 seconds — ABNORMAL HIGH (ref 11.4–15.2)

## 2017-10-02 SURGERY — AMPUTATION, FOOT, RAY
Anesthesia: Monitor Anesthesia Care | Laterality: Left

## 2017-10-02 MED ORDER — CEFAZOLIN SODIUM-DEXTROSE 2-4 GM/100ML-% IV SOLN
INTRAVENOUS | Status: AC
  Filled 2017-10-02: qty 100

## 2017-10-02 MED ORDER — PROPOFOL 10 MG/ML IV BOLUS
INTRAVENOUS | Status: AC
  Filled 2017-10-02: qty 40

## 2017-10-02 MED ORDER — LACTATED RINGERS IV SOLN
INTRAVENOUS | Status: DC
  Administered 2017-10-02 (×2): via INTRAVENOUS

## 2017-10-02 MED ORDER — CHLORHEXIDINE GLUCONATE 4 % EX LIQD: 60.0000 mL | Freq: Once | CUTANEOUS | Status: DC

## 2017-10-02 MED ORDER — DEXTROSE 5 % IV SOLN
3.0000 g | INTRAVENOUS | Status: AC
  Administered 2017-10-02: 3 g via INTRAVENOUS
  Filled 2017-10-02 (×2): qty 3000

## 2017-10-02 MED ORDER — 0.9 % SODIUM CHLORIDE (POUR BTL) OPTIME
TOPICAL | Status: DC | PRN
  Administered 2017-10-02: 1000 mL

## 2017-10-02 MED ORDER — OXYCODONE-ACETAMINOPHEN 5-325 MG PO TABS: 1.0000 | ORAL_TABLET | ORAL | 0 refills | Status: DC | PRN

## 2017-10-02 MED ORDER — LIDOCAINE HCL 1 % IJ SOLN
INTRAMUSCULAR | Status: AC
Start: 2017-10-02 — End: ?
  Filled 2017-10-02: qty 20

## 2017-10-02 MED ORDER — LIDOCAINE HCL (PF) 1 % IJ SOLN
INTRAMUSCULAR | Status: DC | PRN
  Administered 2017-10-02: 20 mL

## 2017-10-02 MED ORDER — DEXMEDETOMIDINE HCL IN NACL 200 MCG/50ML IV SOLN
INTRAVENOUS | Status: DC | PRN
  Administered 2017-10-02: 8 ug via INTRAVENOUS
  Administered 2017-10-02: 4 ug via INTRAVENOUS
  Administered 2017-10-02 (×2): 8 ug via INTRAVENOUS

## 2017-10-02 MED ORDER — PROPOFOL 10 MG/ML IV BOLUS
INTRAVENOUS | Status: DC | PRN
  Administered 2017-10-02 (×2): 40 mg via INTRAVENOUS
  Administered 2017-10-02: 30 mg via INTRAVENOUS
  Administered 2017-10-02: 40 mg via INTRAVENOUS
  Administered 2017-10-02: 10 mg via INTRAVENOUS
  Administered 2017-10-02: 20 mg via INTRAVENOUS

## 2017-10-02 SURGICAL SUPPLY — 30 items
BLADE SAW SGTL MED 73X18.5 STR (BLADE) IMPLANT
BLADE SURG 21 STRL SS (BLADE) ×1 IMPLANT
BNDG COHESIVE 4X5 TAN STRL (GAUZE/BANDAGES/DRESSINGS) ×3 IMPLANT
BNDG GAUZE ELAST 4 BULKY (GAUZE/BANDAGES/DRESSINGS) ×3 IMPLANT
COVER SURGICAL LIGHT HANDLE (MISCELLANEOUS) ×4 IMPLANT
DRAPE U-SHAPE 47X51 STRL (DRAPES) ×6 IMPLANT
DRSG ADAPTIC 3X8 NADH LF (GAUZE/BANDAGES/DRESSINGS) ×3 IMPLANT
DRSG PAD ABDOMINAL 8X10 ST (GAUZE/BANDAGES/DRESSINGS) ×4 IMPLANT
DURAPREP 26ML APPLICATOR (WOUND CARE) ×3 IMPLANT
ELECT REM PT RETURN 9FT ADLT (ELECTROSURGICAL) ×3
ELECTRODE REM PT RTRN 9FT ADLT (ELECTROSURGICAL) ×1 IMPLANT
GAUZE SPONGE 4X4 12PLY STRL (GAUZE/BANDAGES/DRESSINGS) ×3 IMPLANT
GAUZE SPONGE 4X4 12PLY STRL LF (GAUZE/BANDAGES/DRESSINGS) ×2 IMPLANT
GLOVE BIOGEL PI IND STRL 9 (GLOVE) ×1 IMPLANT
GLOVE BIOGEL PI INDICATOR 9 (GLOVE) ×2
GLOVE SURG ORTHO 9.0 STRL STRW (GLOVE) ×3 IMPLANT
GOWN STRL REUS W/ TWL XL LVL3 (GOWN DISPOSABLE) ×2 IMPLANT
GOWN STRL REUS W/TWL XL LVL3 (GOWN DISPOSABLE) ×6
KIT BASIN OR (CUSTOM PROCEDURE TRAY) ×3 IMPLANT
KIT ROOM TURNOVER OR (KITS) ×3 IMPLANT
NS IRRIG 1000ML POUR BTL (IV SOLUTION) ×3 IMPLANT
PACK ORTHO EXTREMITY (CUSTOM PROCEDURE TRAY) ×3 IMPLANT
PAD ABD 8X10 STRL (GAUZE/BANDAGES/DRESSINGS) ×2 IMPLANT
PAD ARMBOARD 7.5X6 YLW CONV (MISCELLANEOUS) ×3 IMPLANT
STOCKINETTE IMPERVIOUS LG (DRAPES) IMPLANT
SUT ETHILON 2 0 PSLX (SUTURE) ×3 IMPLANT
TOWEL OR 17X26 10 PK STRL BLUE (TOWEL DISPOSABLE) ×3 IMPLANT
TUBE CONNECTING 12'X1/4 (SUCTIONS) ×1
TUBE CONNECTING 12X1/4 (SUCTIONS) ×2 IMPLANT
YANKAUER SUCT BULB TIP NO VENT (SUCTIONS) ×3 IMPLANT

## 2017-10-02 NOTE — Anesthesia Preprocedure Evaluation (Signed)
Anesthesia Evaluation  Patient identified by MRN, date of birth, ID band Patient awake    Reviewed: Allergy & Precautions, NPO status , Patient's Chart, lab work & pertinent test results  Airway Mallampati: II       Dental  (+) Teeth Intact   Pulmonary    Pulmonary exam normal breath sounds clear to auscultation       Cardiovascular hypertension, Pt. on medications  Rhythm:Irregular Rate:Normal     Neuro/Psych    GI/Hepatic   Endo/Other  diabetes, Insulin Dependent, Oral Hypoglycemic AgentsMorbid obesity  Renal/GU      Musculoskeletal   Abdominal Normal abdominal exam  (+)   Peds  Hematology   Anesthesia Other Findings   Reproductive/Obstetrics                             Anesthesia Physical Anesthesia Plan  ASA: III  Anesthesia Plan: MAC   Post-op Pain Management:    Induction:   PONV Risk Score and Plan:   Airway Management Planned: Natural Airway and Simple Face Mask  Additional Equipment:   Intra-op Plan:   Post-operative Plan:   Informed Consent: I have reviewed the patients History and Physical, chart, labs and discussed the procedure including the risks, benefits and alternatives for the proposed anesthesia with the patient or authorized representative who has indicated his/her understanding and acceptance.     Plan Discussed with: CRNA and Surgeon  Anesthesia Plan Comments:         Anesthesia Quick Evaluation

## 2017-10-02 NOTE — Transfer of Care (Signed)
Immediate Anesthesia Transfer of Care Note  Patient: Oscar [name].  Procedure(s) Performed: LEFT FOOT 1ST RAY AMPUTATION (Left )  Patient Location: PACU  Anesthesia Type:MAC  Level of Consciousness: awake, alert , oriented and patient cooperative  Airway & Oxygen Therapy: Patient Spontanous Breathing and Patient connected to nasal cannula oxygen  Post-op Assessment: Report given to RN and Post -op Vital signs reviewed and stable  Post vital signs: Reviewed and stable  Last Vitals:  Vitals:   10/02/17 1342  BP: 122/67  Pulse: 62  Resp: 18  Temp: 36.9 C  SpO2: 96%    Last Pain:  Vitals:   10/02/17 1342  TempSrc: Oral  PainSc:       Patients Stated Pain Goal: 4 (99/83/38 2505)  Complications: No apparent anesthesia complications

## 2017-10-02 NOTE — Anesthesia Procedure Notes (Signed)
Procedure Name: MAC Date/Time: 10/02/2017 2:42 PM Performed by: Renato Shin, CRNA Pre-anesthesia Checklist: Patient identified, Emergency Drugs available, Suction available and Patient being monitored Patient Re-evaluated:Patient Re-evaluated prior to induction Oxygen Delivery Method: Nasal cannula Induction Type: IV induction Placement Confirmation: positive ETCO2,  CO2 detector and breath sounds checked- equal and bilateral Dental Injury: Teeth and Oropharynx as per pre-operative assessment

## 2017-10-02 NOTE — Op Note (Signed)
10/02/2017  3:21 PM  PATIENT:  Oscar [name].    PRE-OPERATIVE DIAGNOSIS:  osteomyelitis left great toe  POST-OPERATIVE DIAGNOSIS:  Same  PROCEDURE:  LEFT FOOT 1ST RAY AMPUTATION Local tissue rearrangement for wound closure 4 x 7 cm.  SURGEON:  Newt Minion, MD  PHYSICIAN ASSISTANT:None ANESTHESIA:   General  PREOPERATIVE INDICATIONS:  Oscar [name]. is a  72 y.o. male with a diagnosis of osteomyelitis left great toe who failed conservative measures and elected for surgical management.    The risks benefits and alternatives were discussed with the patient preoperatively including but not limited to the risks of infection, bleeding, nerve injury, cardiopulmonary complications, the need for revision surgery, among others, and the patient was willing to proceed.  OPERATIVE IMPLANTS: None.  @ENCIMAGES @  OPERATIVE FINDINGS: Good petechial bleeding no abscess.  OPERATIVE PROCEDURE: Patient was brought the operating room after undergoing a regional block.  After adequate levels of anesthesia were obtained patient's left lower extremity was prepped using DuraPrep draped into a sterile field a timeout was called.  A racquet incision was made to encompass the toe and the plantar ulcer this left a wound that was 4 x 7 cm.  Electrocautery was used for hemostasis the toe and first ray were amputated through the base of the first metatarsal.  Electrocautery was used for hemostasis further irrigation.  Local tissue rearrangement was used to close the wound that was 4 x 7 cm 2-0 nylon was used for wound closure.  A sterile dressing was applied patient was taken to the PACU in stable condition.   DISCHARGE PLANNING:  Antibiotic duration: Perioperative antibiotics discontinue oral antibiotics  Weightbearing: Nonweightbearing left lower extremity  Pain medication: Percocet  Dressing care/ Wound VAC: Keep dressing clean and dry  Ambulatory devices: Walker.  Discharge to:  Home.  Follow-up: In the office 1 week post operative.

## 2017-10-02 NOTE — H&P (Signed)
Oscar [name]. is an 72 y.o. male.   Chief Complaint: Osteomyelitis ulceration pain right great toe HPI: Patient is a 72 year old gentleman with diabetic insensate neuropathy with a [name] of the left foot great toe first metatarsal head.  He was in the emergency room at [facility] last night a incision was made to decompress the wound patient is seen today urgently for follow-up evaluation of the necrotic ulcer.    Past Medical History:  Diagnosis Date  . Anal fissure    h/o - no recent complications  . Arthritis    hips  . Atrial fibrillation ([place])   . Depression   . Diabetes mellitus without complication ([place])    improved after diet/exercise  . Enlarged prostate   . GERD (gastroesophageal reflux disease)   . Gout   . H/O hiatal hernia   . Hx of typhoid fever    as a child  . Hypertension    improved after diet/exercise  . Kidney stones   . Memory change 04/14/2017  . OSA (obstructive sleep apnea)    wears cpap  . Pneumonia   . PONV (postoperative nausea and vomiting)    also difficulty waking up  . Stroke [facility]) 08/2013   short term memory loss (slight)    Past Surgical History:  Procedure Laterality Date  . carpel tunnel Bilateral   . CATARACT EXTRACTION W/ INTRAOCULAR LENS  IMPLANT, BILATERAL    . CHOLECYSTECTOMY    . COLONOSCOPY    . EYE SURGERY     laser surgery   . I&D EXTREMITY Left 09/15/2014   Procedure: Excision Necrotic Left Achilles, Skin Graft, apply Wound VAC;  Surgeon: Newt Minion, MD;  Location: [facility];  Service: Orthopedics;  Laterality: Left;  . KIDNEY STONE SURGERY    . KNEE ARTHROSCOPY Bilateral   . LIGAMENT REPAIR    . TONSILLECTOMY    . VASECTOMY      Family History  Problem Relation Age of Onset  . COPD Father   . Heart attack Sister        died age 18 of MI   Social History:  reports that  has never smoked. he has never used smokeless tobacco. He reports that he drinks alcohol. He reports that he does not use  drugs.  Allergies:  Allergies  Allergen Reactions  . Iohexol      Desc: HIVES S/P 13HR.PREMEDS, ?LOW DOSAGE PREMEDS   . Ivp Dye [Iodinated Diagnostic Agents]     welps     No medications prior to admission.    Results for orders placed or performed during the hospital encounter of 09/30/17 (from the past 48 hour(s))  CBC with Differential     Status: Abnormal   Collection Time: 09/30/17 11:15 PM  Result Value Ref Range   WBC 9.1 4.0 - 10.5 K/uL   RBC 4.04 (L) 4.22 - 5.81 MIL/uL   Hemoglobin 12.1 (L) 13.0 - 17.0 g/dL   HCT 37.3 (L) 39.0 - 52.0 %   MCV 92.3 78.0 - 100.0 fL   MCH 30.0 26.0 - 34.0 pg   MCHC 32.4 30.0 - 36.0 g/dL   RDW 15.2 11.5 - 15.5 %   Platelets 219 150 - 400 K/uL   Neutrophils Relative % 72 %   Neutro Abs 6.6 1.7 - 7.7 K/uL   Lymphocytes Relative 18 %   Lymphs Abs 1.6 0.7 - 4.0 K/uL   Monocytes Relative 7 %   Monocytes Absolute  0.7 0.1 - 1.0 K/uL   Eosinophils Relative 3 %   Eosinophils Absolute 0.2 0.0 - 0.7 K/uL   Basophils Relative 0 %   Basophils Absolute 0.0 0.0 - 0.1 K/uL    Comment: Performed at [facility], [place] [address] [place], [place] [address]  Basic metabolic panel     Status: Abnormal   Collection Time: 09/30/17 11:22 PM  Result Value Ref Range   Sodium 138 135 - 145 mmol/L   Potassium 3.4 (L) 3.5 - 5.1 mmol/L   Chloride 95 (L) 101 - 111 mmol/L   CO2 28 22 - 32 mmol/L   Glucose, Bld 368 (H) 65 - 99 mg/dL   BUN 37 (H) 6 - 20 mg/dL   Creatinine, Ser 1.60 (H) 0.61 - 1.24 mg/dL   Calcium 9.5 8.9 - 10.3 mg/dL   GFR calc non Af Amer 41 (L) >60 mL/min   GFR calc Af Amer 48 (L) >60 mL/min    Comment: (NOTE) The eGFR has been calculated using the CKD EPI equation. This calculation has not been validated in all clinical situations. eGFR's persistently <60 mL/min signify possible Chronic Kidney Disease.    Anion gap 15 5 - 15    Comment: Performed at [facility], [place] [address] [place],  [place] [address]  Wound or Superficial Culture     Status: None (Preliminary result)   Collection Time: 09/30/17 11:28 PM  Result Value Ref Range   Specimen Description      ABSCESS FOOT LEFT Performed at [place] [address] [place], [place] [address]    Special Requests      NONE Performed at [facility], [place] [address], [place], [place] [address]    Gram Stain      RARE WBC PRESENT, PREDOMINANTLY MONONUCLEAR NO ORGANISMS SEEN Performed at [facility], [place] [address] [place], [place] [address]    Culture PENDING    Report Status PENDING    Dg Foot Complete Left  Result Date: 09/30/2017 CLINICAL DATA:  Abscess along the plantar aspect of the foot base of the great toe. EXAM: LEFT FOOT - COMPLETE 3+ VIEW COMPARISON:  None. FINDINGS: Focal soft tissue swelling with superficial ulceration is noted compatible with reported history of a soft tissue ulcer. No underlying bone destruction is seen. There is mild osteoarthritic change of the toes. Joint dislocation. Vascular calcifications are present. IMPRESSION: Focal soft tissue swelling the plantar aspect of the great toe without underlying destruction. Differential considerations may include focal soft tissue abscess or possibly gout among some possibilities given patient medical history. Electronically Signed   By: Ashley Royalty M.D.   On: 09/30/2017 23:22    Review of Systems  All other systems reviewed and are negative.   There were no vitals taken for this visit. Physical Exam  Patient is alert, oriented, no adenopathy, well-dressed, normal affect, normal respiratory effort. Examination patient has a palpable dorsalis pedis pulse he has a large foul-smelling fungating necrotic ulcer beneath the first metatarsal head of the right foot.  There is a incision lateral to the ulcer which goes through the dermis but is no deep or there are stitches in this incision.  After informed consent a 10  blade knife was used to debride the massive amount of necrotic tissue from the ulcer.  After debridement the ulcer is 4 cm in diameter and 2 cm deep this probes all the way down to the metatarsal  head.  There is foul-smelling drainage.  Patient has a [name].  There is no ascending cellulitis.   Assessment/Plan 1. Osteomyelitis of toe of left foot ([place])     Plan: Patient has exposed bone through the ulcer he has osteomyelitis with odor swelling and drainage.  We will need to proceed with a first ray amputation.  We will set this up for tomorrow.  Discussed the importance of protected weightbearing discussed that his cane would not be sufficient he would need to use a walker and a wheelchair.  Risk of too much pressure and wound dehiscence and need for additional amputation was discussed.  Patient states he understands and wishes to proceed at this time.     Newt Minion, MD 10/02/2017, 7:24 AM

## 2017-10-03 LAB — AEROBIC CULTURE W GRAM STAIN (SUPERFICIAL SPECIMEN): Culture: NO GROWTH

## 2017-10-05 ENCOUNTER — Encounter (HOSPITAL_COMMUNITY): Payer: Self-pay | Admitting: Orthopedic Surgery

## 2017-10-05 NOTE — Anesthesia Postprocedure Evaluation (Signed)
Anesthesia Post Note  Patient: Oscar [name].  Procedure(s) Performed: LEFT FOOT 1ST RAY AMPUTATION (Left )     Patient location during evaluation: PACU Anesthesia Type: MAC Level of consciousness: awake Pain management: pain level controlled Vital Signs Assessment: post-procedure vital signs reviewed and stable Respiratory status: spontaneous breathing Cardiovascular status: stable Postop Assessment: adequate PO intake Anesthetic complications: no    Last Vitals:  Vitals:   10/02/17 1530 10/02/17 1545  BP: 90/61 (!) 90/58  Pulse: (!) 55 (!) 59  Resp: 13 14  Temp:  36.6 C  SpO2: 95% 96%    Last Pain:  Vitals:   10/02/17 1342  TempSrc: Oral  PainSc:    Pain Goal: Patients Stated Pain Goal: 4 (10/02/17 1334)               [place]

## 2017-10-09 ENCOUNTER — Telehealth (INDEPENDENT_AMBULATORY_CARE_PROVIDER_SITE_OTHER): Payer: Self-pay | Admitting: Orthopedic Surgery

## 2017-10-09 ENCOUNTER — Other Ambulatory Visit (INDEPENDENT_AMBULATORY_CARE_PROVIDER_SITE_OTHER): Payer: Self-pay | Admitting: Physician Assistant

## 2017-10-09 NOTE — Telephone Encounter (Signed)
Patients daughter called requesting RX refill of Oxycodone-acetaminophen for him, she said he would be out over the weekend. Please advise Cassandra # [phone]

## 2017-10-09 NOTE — Telephone Encounter (Signed)
He should have enough meds to get him to Monday morning.  Have him call back Monday morning if needing more

## 2017-10-09 NOTE — Telephone Encounter (Signed)
10/02/17 pt is s/p a left ray amputation. Pt's daughter requesting refill of percocet. Last given #60 10/02/17. Please advise.

## 2017-10-09 NOTE — Telephone Encounter (Signed)
I called and sw pt to advise that he was given a 10 day supply on Friday. So the rx will last until Monday when Dr. Sharol Given is back in the office and I will discuss with him and call back to advise of refill.

## 2017-10-12 ENCOUNTER — Ambulatory Visit (INDEPENDENT_AMBULATORY_CARE_PROVIDER_SITE_OTHER): Payer: Medicare Other | Admitting: Orthopedic Surgery

## 2017-10-12 ENCOUNTER — Encounter (INDEPENDENT_AMBULATORY_CARE_PROVIDER_SITE_OTHER): Payer: Self-pay | Admitting: Orthopedic Surgery

## 2017-10-12 VITALS — Ht 69.0 in | Wt 303.0 lb

## 2017-10-12 DIAGNOSIS — M869 Osteomyelitis, unspecified: Secondary | ICD-10-CM

## 2017-10-12 MED ORDER — PENTOXIFYLLINE ER 400 MG PO TBCR: 400.0000 mg | EXTENDED_RELEASE_TABLET | Freq: Three times a day (TID) | ORAL | 3 refills | Status: DC

## 2017-10-12 MED ORDER — OXYCODONE-ACETAMINOPHEN 5-325 MG PO TABS: 1.0000 | ORAL_TABLET | Freq: Three times a day (TID) | ORAL | 0 refills | Status: DC | PRN

## 2017-10-12 NOTE — Progress Notes (Signed)
Office Visit Note   Patient: Oscar [name].           Date of Birth: 1946-04-30           MRN: [id number] Visit Date: 10/12/2017              Requested by: Josetta Huddle, MD [address] [place], [place] [address] PCP: Josetta Huddle, MD  Chief Complaint  Patient presents with  . Left Foot - Routine Post Op    10/02/17 left first ray amputation       HPI: Patient is a 72 year old gentleman who presents in follow-up for left foot ray amputation.  Patient has been nonweightbearing he is currently in a Darco shoe and a kneeling scooter.  Patient's family states that his sugars have gone from the 400s and 500s down to the 100s after surgery.  Assessment & Plan: Visit Diagnoses:  1. Osteomyelitis of toe of left foot (HCC)     Plan: Patient's wounds show some mild ischemic changes.  We will put him on Trental .  He is on Xarelto discussed that if he has any bleeding or bruising to discontinue the Trental  Follow-Up Instructions: Return in about 1 week (around 10/19/2017).   Ortho Exam  Patient is alert, oriented, no adenopathy, well-dressed, normal affect, normal respiratory effort. Semination patient has a good dorsalis pedis pulse there is some mild ischemic changes along the wound edges.  There is no dehiscence there is some mild bruising there is a small amount of clear serosanguineous drainage there is no hematoma.  There is no cellulitis.  Imaging: No results found. No images are attached to the encounter.  Labs: Lab Results  Component Value Date   HGBA1C 10.1 (H) 09/08/2016   HGBA1C 9.1 (H) 08/28/2014   HGBA1C 10.1 (H) 08/07/2013   ESRSEDRATE 75 (H) 08/28/2014   ESRSEDRATE 42 (H) 11/17/2012   CRP 12.9 (H) 08/28/2014   LABURIC 3.0 (L) 08/31/2014   REPTSTATUS 10/03/2017 FINAL 09/30/2017   GRAMSTAIN  09/30/2017    RARE WBC PRESENT, PREDOMINANTLY MONONUCLEAR NO ORGANISMS SEEN    CULT  09/30/2017    NO GROWTH 2 DAYS Performed at [facility], [place] [address], [place], [place] [address]     [email or web address] [phone]  Body mass index is 44.75 kg/m.  Orders:  No orders of the defined types were placed in this encounter.  Meds ordered this encounter  Medications  . pentoxifylline (TRENTAL) 400 MG CR tablet    Sig: Take 1 tablet (400 mg total) by mouth 3 (three) times daily with meals.    Dispense:  90 tablet    Refill:  3  . oxyCODONE-acetaminophen (PERCOCET/ROXICET) 5-325 MG tablet    Sig: Take 1 tablet by mouth every 8 (eight) hours as needed for severe pain.    Dispense:  30 tablet    Refill:  0     Procedures: No procedures performed  Clinical Data: No additional findings.  ROS:  All other systems negative, except as noted in the HPI. Review of Systems  Objective: Vital Signs: Ht 5\' 9"  (1.753 m)   Wt (!) 303 lb (137.4 kg)   BMI 44.75 kg/m   Specialty Comments:  No specialty comments available.  PMFS History: Patient Active Problem List   Diagnosis Date Noted  . Chronic osteomyelitis of toe, left ([place])   . Memory change 04/14/2017  . Chronic ulcer of great toe of left foot, limited to breakdown of skin (  [place]) 04/09/2017  . Cutaneous abscess of left foot 04/09/2017  . Non-pressure chronic ulcer of right heel and midfoot limited to breakdown of skin ([place]) 10/07/2016  . Influenza A   . AKI (acute kidney injury) ([place]) 09/07/2016  . Acute encephalopathy 09/07/2016  . Acute respiratory failure with hypoxia ([place]) 09/07/2016  . Elevated lactic acid level 09/07/2016  . Elevated troponin 09/07/2016  . Coronary artery calcification seen on CT scan 10/28/2015  . Acute on chronic diastolic (congestive) heart failure ([place]) 09/26/2015  . OSA (obstructive sleep apnea) 02/07/2015  . Sepsis ([place]) 08/28/2014  . Chronic diastolic heart failure ([place]) 09/21/2013  . CVA (cerebral infarction) 08/08/2013  . Dizziness 08/07/2013  . TIA (transient ischemic attack) 08/07/2013  . Atrial fibrillation ([place]) 08/07/2013  .  Hypertension 08/07/2013  . Anemia 08/07/2013  . Dyspnea 06/21/2013  . Chest pain 11/16/2012  . Atrial fibrillation with RVR ([place]) 11/16/2012  . Gout 11/16/2012  . Diabetes mellitus type 2, uncontrolled, with complications ([place]) 16/60/6301  . CKD (chronic kidney disease), stage II 11/16/2012  . Atypical atrial flutter ([place]) 11/16/2012  . Pleuritic chest pain 11/16/2012  . Morbid obesity ([place]) 11/16/2012  . Obstructive sleep apnea 11/16/2012   Past Medical History:  Diagnosis Date  . Anal fissure    h/o - no recent complications  . Arthritis    hips  . Atrial fibrillation ([place])   . Depression   . Diabetes mellitus without complication ([place])    improved after diet/exercise  . Enlarged prostate   . GERD (gastroesophageal reflux disease)   . Gout   . H/O hiatal hernia   . Hx of typhoid fever    as a child  . Hypertension    improved after diet/exercise  . Kidney stones   . Memory change 04/14/2017  . OSA (obstructive sleep apnea)    wears cpap  . Pneumonia   . PONV (postoperative nausea and vomiting)    also difficulty waking up  . Stroke [facility]) 08/2013   short term memory loss (slight)    Family History  Problem Relation Age of Onset  . COPD Father   . Heart attack Sister        died age 76 of MI    Past Surgical History:  Procedure Laterality Date  . AMPUTATION Left 10/02/2017   Procedure: LEFT FOOT 1ST RAY AMPUTATION;  Surgeon: Newt Minion, MD;  Location: [facility];  Service: Orthopedics;  Laterality: Left;  . carpel tunnel Bilateral   . CATARACT EXTRACTION W/ INTRAOCULAR LENS  IMPLANT, BILATERAL    . CHOLECYSTECTOMY    . COLONOSCOPY    . EYE SURGERY     laser surgery   . I&D EXTREMITY Left 09/15/2014   Procedure: Excision Necrotic Left Achilles, Skin Graft, apply Wound VAC;  Surgeon: Newt Minion, MD;  Location: [facility];  Service: Orthopedics;  Laterality: Left;  . KIDNEY STONE SURGERY    . KNEE ARTHROSCOPY Bilateral   . LIGAMENT REPAIR    . TONSILLECTOMY    .  VASECTOMY     Social History   Occupational History  . Occupation: Retired  Tobacco Use  . Smoking status: Never Smoker  . Smokeless tobacco: Never Used  Substance and Sexual Activity  . Alcohol use: Yes    Alcohol/week: 0.0 oz    Comment: rare - 1-2x /yr  . Drug use: No  . Sexual activity: Not on file

## 2017-10-12 NOTE — Telephone Encounter (Signed)
Pt has appt today at 1:45 will hold message and address with pt during visit.

## 2017-10-15 ENCOUNTER — Ambulatory Visit: Payer: Medicare Other | Admitting: Neurology

## 2017-10-20 ENCOUNTER — Encounter (INDEPENDENT_AMBULATORY_CARE_PROVIDER_SITE_OTHER): Payer: Self-pay | Admitting: Orthopedic Surgery

## 2017-10-20 ENCOUNTER — Ambulatory Visit (INDEPENDENT_AMBULATORY_CARE_PROVIDER_SITE_OTHER): Payer: Medicare Other | Admitting: Orthopedic Surgery

## 2017-10-20 VITALS — Ht 69.0 in | Wt 303.0 lb

## 2017-10-20 DIAGNOSIS — L97411 Non-pressure chronic ulcer of right heel and midfoot limited to breakdown of skin: Secondary | ICD-10-CM

## 2017-10-20 DIAGNOSIS — IMO0002 Reserved for concepts with insufficient information to code with codable children: Secondary | ICD-10-CM

## 2017-10-20 DIAGNOSIS — Z89412 Acquired absence of left great toe: Secondary | ICD-10-CM

## 2017-10-20 MED ORDER — NITROGLYCERIN 0.2 MG/HR TD PT24: 0.2000 mg | MEDICATED_PATCH | Freq: Every day | TRANSDERMAL | 12 refills | Status: DC

## 2017-10-20 MED ORDER — DOXYCYCLINE HYCLATE 100 MG PO TABS: 100.0000 mg | ORAL_TABLET | Freq: Two times a day (BID) | ORAL | 0 refills | Status: DC

## 2017-10-20 NOTE — Progress Notes (Signed)
Office Visit Note   Patient: Oscar [name].           Date of Birth: 03-17-1946           MRN: [id number] Visit Date: 10/20/2017              Requested by: Josetta Huddle, MD [address] [place], [place] [address] PCP: Josetta Huddle, MD  Chief Complaint  Patient presents with  . Left Foot - Routine Post Op    10/02/17 left foot 1st ray amputation       HPI: Patient is a 72 year old gentleman diabetic insensate neuropathy he is status post left first ray amputation.  Patient states that recently he has noticed some increasing wound breakdown drainage maceration.  Assessment & Plan: Visit Diagnoses:  1. Great toe amputation status, left ([place])   2. Non-pressure chronic ulcer of right heel and midfoot limited to breakdown of skin ([place])     Plan: Will have him continue with Trental 400 mg 3 times a day we will start him on a nitroglycerin patch and doxycycline continue with elevation continue nonweightbearing continue with Dial soap cleansing.  Follow-Up Instructions: Return in about 1 week (around 10/27/2017).   Ortho Exam  Patient is alert, oriented, no adenopathy, well-dressed, normal affect, normal respiratory effort. Examination patient has a excellent dorsalis pedis pulse.  The wound shows progressive dehiscence consistent with the microcirculatory dysfunction.  There is no cellulitis around the wound no purulent drainage no signs of infection.  Patient states that his sugars are running below 200 at this time.  He denies any odor or pain.  There is some draining hematoma.  Imaging: No results found. No images are attached to the encounter.  Labs: Lab Results  Component Value Date   HGBA1C 10.1 (H) 09/08/2016   HGBA1C 9.1 (H) 08/28/2014   HGBA1C 10.1 (H) 08/07/2013   ESRSEDRATE 75 (H) 08/28/2014   ESRSEDRATE 42 (H) 11/17/2012   CRP 12.9 (H) 08/28/2014   LABURIC 3.0 (L) 08/31/2014   REPTSTATUS 10/03/2017 FINAL 09/30/2017   GRAMSTAIN  09/30/2017   RARE WBC PRESENT, PREDOMINANTLY MONONUCLEAR NO ORGANISMS SEEN    CULT  09/30/2017    NO GROWTH 2 DAYS Performed at [facility], [place] [address] [place], [place] [address]     @[email or web address] [phone]  Body mass index is 44.75 kg/m.  Orders:  No orders of the defined types were placed in this encounter.  No orders of the defined types were placed in this encounter.    Procedures: No procedures performed  Clinical Data: No additional findings.  ROS:  All other systems negative, except as noted in the HPI. Review of Systems  Objective: Vital Signs: Ht 5\' 9"  (1.753 m)   Wt (!) 303 lb (137.4 kg)   BMI 44.75 kg/m   Specialty Comments:  No specialty comments available.  PMFS History: Patient Active Problem List   Diagnosis Date Noted  . Chronic osteomyelitis of toe, left ([place])   . Memory change 04/14/2017  . Chronic ulcer of great toe of left foot, limited to breakdown of skin ([place]) 04/09/2017  . Cutaneous abscess of left foot 04/09/2017  . Non-pressure chronic ulcer of right heel and midfoot limited to breakdown of skin ([place]) 10/07/2016  . Influenza A   . AKI (acute kidney injury) ([place]) 09/07/2016  . Acute encephalopathy 09/07/2016  . Acute respiratory failure with hypoxia ([place]) 09/07/2016  . Elevated lactic acid level 09/07/2016  . Elevated troponin  09/07/2016  . Coronary artery calcification seen on CT scan 10/28/2015  . Acute on chronic diastolic (congestive) heart failure ([place]) 09/26/2015  . OSA (obstructive sleep apnea) 02/07/2015  . Sepsis ([place]) 08/28/2014  . Chronic diastolic heart failure ([place]) 09/21/2013  . CVA (cerebral infarction) 08/08/2013  . Dizziness 08/07/2013  . TIA (transient ischemic attack) 08/07/2013  . Atrial fibrillation ([place]) 08/07/2013  . Hypertension 08/07/2013  . Anemia 08/07/2013  . Dyspnea 06/21/2013  . Chest pain 11/16/2012  . Atrial fibrillation with RVR ([place]) 11/16/2012  . Gout 11/16/2012  . Diabetes mellitus type 2,  uncontrolled, with complications ([place]) 14/78/2956  . CKD (chronic kidney disease), stage II 11/16/2012  . Atypical atrial flutter ([place]) 11/16/2012  . Pleuritic chest pain 11/16/2012  . Morbid obesity ([place]) 11/16/2012  . Obstructive sleep apnea 11/16/2012   Past Medical History:  Diagnosis Date  . Anal fissure    h/o - no recent complications  . Arthritis    hips  . Atrial fibrillation ([place])   . Depression   . Diabetes mellitus without complication ([place])    improved after diet/exercise  . Enlarged prostate   . GERD (gastroesophageal reflux disease)   . Gout   . H/O hiatal hernia   . Hx of typhoid fever    as a child  . Hypertension    improved after diet/exercise  . Kidney stones   . Memory change 04/14/2017  . OSA (obstructive sleep apnea)    wears cpap  . Pneumonia   . PONV (postoperative nausea and vomiting)    also difficulty waking up  . Stroke [facility]) 08/2013   short term memory loss (slight)    Family History  Problem Relation Age of Onset  . COPD Father   . Heart attack Sister        died age 13 of MI    Past Surgical History:  Procedure Laterality Date  . AMPUTATION Left 10/02/2017   Procedure: LEFT FOOT 1ST RAY AMPUTATION;  Surgeon: Newt Minion, MD;  Location: [facility];  Service: Orthopedics;  Laterality: Left;  . carpel tunnel Bilateral   . CATARACT EXTRACTION W/ INTRAOCULAR LENS  IMPLANT, BILATERAL    . CHOLECYSTECTOMY    . COLONOSCOPY    . EYE SURGERY     laser surgery   . I&D EXTREMITY Left 09/15/2014   Procedure: Excision Necrotic Left Achilles, Skin Graft, apply Wound VAC;  Surgeon: Newt Minion, MD;  Location: [facility];  Service: Orthopedics;  Laterality: Left;  . KIDNEY STONE SURGERY    . KNEE ARTHROSCOPY Bilateral   . LIGAMENT REPAIR    . TONSILLECTOMY    . VASECTOMY     Social History   Occupational History  . Occupation: Retired  Tobacco Use  . Smoking status: Never Smoker  . Smokeless tobacco: Never Used  Substance and Sexual Activity  .  Alcohol use: Yes    Alcohol/week: 0.0 oz    Comment: rare - 1-2x /yr  . Drug use: No  . Sexual activity: Not on file

## 2017-10-27 ENCOUNTER — Encounter (INDEPENDENT_AMBULATORY_CARE_PROVIDER_SITE_OTHER): Payer: Self-pay | Admitting: Orthopedic Surgery

## 2017-10-27 ENCOUNTER — Ambulatory Visit (INDEPENDENT_AMBULATORY_CARE_PROVIDER_SITE_OTHER): Payer: Medicare Other | Admitting: Orthopedic Surgery

## 2017-10-27 VITALS — Ht 69.0 in | Wt 303.0 lb

## 2017-10-27 DIAGNOSIS — Z89412 Acquired absence of left great toe: Secondary | ICD-10-CM

## 2017-10-27 DIAGNOSIS — IMO0002 Reserved for concepts with insufficient information to code with codable children: Secondary | ICD-10-CM

## 2017-10-27 NOTE — Progress Notes (Signed)
Office Visit Note   Patient: Oscar [name].           Date of Birth: 1945/11/24           MRN: [id number] Visit Date: 10/27/2017              Requested by: Josetta Huddle, MD [address] [place], [place] [address] PCP: Josetta Huddle, MD  Chief Complaint  Patient presents with  . Left Foot - Routine Post Op    10/02/17 Left foot 1st ray amputation      HPI: Patient is a 72 year old gentleman who presents in follow-up status post left foot first ray amputation he is about 4 weeks out.  He is now on a kneeling scooter he is using Trental 400 mg 3 times a day nitroglycerin patch and doxycycline.  He is unsure if he is taking the medications as instructed.  Assessment & Plan: Visit Diagnoses:  1. Great toe amputation status, left ([place])     Plan: The wound is stable we will have him continue with the Silvadene Dial soap cleansing doxycycline twice a day nitroglycerin patch change daily and Trental 3 times a day.  Patient will continue with his Xarelto as well.  Continue with elevation protected weightbearing harvest the sutures at follow-up.  Follow-Up Instructions: Return in about 2 weeks (around 11/10/2017).   Ortho Exam  Patient is alert, oriented, no adenopathy, well-dressed, normal affect, normal respiratory effort. Examination patient's incision is healing slowly.  There is an area of black eschar that is 1 x 2 cm with mild dermatitis around the wound there is no drainage no cellulitis there is no ascending cellulitis.  Imaging: No results found. No images are attached to the encounter.  Labs: Lab Results  Component Value Date   HGBA1C 10.1 (H) 09/08/2016   HGBA1C 9.1 (H) 08/28/2014   HGBA1C 10.1 (H) 08/07/2013   ESRSEDRATE 75 (H) 08/28/2014   ESRSEDRATE 42 (H) 11/17/2012   CRP 12.9 (H) 08/28/2014   LABURIC 3.0 (L) 08/31/2014   REPTSTATUS 10/03/2017 FINAL 09/30/2017   GRAMSTAIN  09/30/2017    RARE WBC PRESENT, PREDOMINANTLY MONONUCLEAR NO ORGANISMS  SEEN    CULT  09/30/2017    NO GROWTH 2 DAYS Performed at [facility], [place] [address], [place],  [address]     @[email or web address] [phone]  Body mass index is 44.75 kg/m.  Orders:  No orders of the defined types were placed in this encounter.  No orders of the defined types were placed in this encounter.    Procedures: No procedures performed  Clinical Data: No additional findings.  ROS:  All other systems negative, except as noted in the HPI. Review of Systems  Objective: Vital Signs: Ht 5\' 9"  (1.753 m)   Wt (!) 303 lb (137.4 kg)   BMI 44.75 kg/m   Specialty Comments:  No specialty comments available.  PMFS History: Patient Active Problem List   Diagnosis Date Noted  . Chronic osteomyelitis of toe, left ([place])   . Memory change 04/14/2017  . Chronic ulcer of great toe of left foot, limited to breakdown of skin ([place]) 04/09/2017  . Cutaneous abscess of left foot 04/09/2017  . Non-pressure chronic ulcer of right heel and midfoot limited to breakdown of skin ([place]) 10/07/2016  . Influenza A   . AKI (acute kidney injury) ([place]) 09/07/2016  . Acute encephalopathy 09/07/2016  . Acute respiratory failure with hypoxia ([place]) 09/07/2016  . Elevated lactic acid level 09/07/2016  .  Elevated troponin 09/07/2016  . Coronary artery calcification seen on CT scan 10/28/2015  . Acute on chronic diastolic (congestive) heart failure ([place]) 09/26/2015  . OSA (obstructive sleep apnea) 02/07/2015  . Sepsis ([place]) 08/28/2014  . Chronic diastolic heart failure ([place]) 09/21/2013  . CVA (cerebral infarction) 08/08/2013  . Dizziness 08/07/2013  . TIA (transient ischemic attack) 08/07/2013  . Atrial fibrillation ([place]) 08/07/2013  . Hypertension 08/07/2013  . Anemia 08/07/2013  . Dyspnea 06/21/2013  . Chest pain 11/16/2012  . Atrial fibrillation with RVR ([place]) 11/16/2012  . Gout 11/16/2012  . Diabetes mellitus type 2, uncontrolled, with complications ([place]) 71/69/6789  . CKD  (chronic kidney disease), stage II 11/16/2012  . Atypical atrial flutter ([place]) 11/16/2012  . Pleuritic chest pain 11/16/2012  . Morbid obesity ([place]) 11/16/2012  . Obstructive sleep apnea 11/16/2012   Past Medical History:  Diagnosis Date  . Anal fissure    h/o - no recent complications  . Arthritis    hips  . Atrial fibrillation ([place])   . Depression   . Diabetes mellitus without complication ([place])    improved after diet/exercise  . Enlarged prostate   . GERD (gastroesophageal reflux disease)   . Gout   . H/O hiatal hernia   . Hx of typhoid fever    as a child  . Hypertension    improved after diet/exercise  . Kidney stones   . Memory change 04/14/2017  . OSA (obstructive sleep apnea)    wears cpap  . Pneumonia   . PONV (postoperative nausea and vomiting)    also difficulty waking up  . Stroke [facility]) 08/2013   short term memory loss (slight)    Family History  Problem Relation Age of Onset  . COPD Father   . Heart attack Sister        died age 61 of MI    Past Surgical History:  Procedure Laterality Date  . AMPUTATION Left 10/02/2017   Procedure: LEFT FOOT 1ST RAY AMPUTATION;  Surgeon: Newt Minion, MD;  Location: [facility];  Service: Orthopedics;  Laterality: Left;  . carpel tunnel Bilateral   . CATARACT EXTRACTION W/ INTRAOCULAR LENS  IMPLANT, BILATERAL    . CHOLECYSTECTOMY    . COLONOSCOPY    . EYE SURGERY     laser surgery   . I&D EXTREMITY Left 09/15/2014   Procedure: Excision Necrotic Left Achilles, Skin Graft, apply Wound VAC;  Surgeon: Newt Minion, MD;  Location: [facility];  Service: Orthopedics;  Laterality: Left;  . KIDNEY STONE SURGERY    . KNEE ARTHROSCOPY Bilateral   . LIGAMENT REPAIR    . TONSILLECTOMY    . VASECTOMY     Social History   Occupational History  . Occupation: Retired  Tobacco Use  . Smoking status: Never Smoker  . Smokeless tobacco: Never Used  Substance and Sexual Activity  . Alcohol use: Yes    Alcohol/week: 0.0 oz    Comment: rare  - 1-2x /yr  . Drug use: No  . Sexual activity: Not on file

## 2017-10-29 ENCOUNTER — Other Ambulatory Visit (HOSPITAL_COMMUNITY): Payer: Self-pay | Admitting: Internal Medicine

## 2017-10-30 ENCOUNTER — Other Ambulatory Visit (HOSPITAL_COMMUNITY): Payer: Self-pay | Admitting: *Deleted

## 2017-11-02 DIAGNOSIS — M109 Gout, unspecified: Secondary | ICD-10-CM | POA: Diagnosis not present

## 2017-11-02 DIAGNOSIS — G4733 Obstructive sleep apnea (adult) (pediatric): Secondary | ICD-10-CM | POA: Diagnosis not present

## 2017-11-02 DIAGNOSIS — I509 Heart failure, unspecified: Secondary | ICD-10-CM | POA: Diagnosis not present

## 2017-11-02 DIAGNOSIS — Z1389 Encounter for screening for other disorder: Secondary | ICD-10-CM | POA: Diagnosis not present

## 2017-11-02 DIAGNOSIS — Z0000 Encounter for general adult medical examination without abnormal findings: Secondary | ICD-10-CM | POA: Diagnosis not present

## 2017-11-02 DIAGNOSIS — E782 Mixed hyperlipidemia: Secondary | ICD-10-CM | POA: Diagnosis not present

## 2017-11-02 DIAGNOSIS — E0840 Diabetes mellitus due to underlying condition with diabetic neuropathy, unspecified: Secondary | ICD-10-CM | POA: Diagnosis not present

## 2017-11-02 DIAGNOSIS — I6789 Other cerebrovascular disease: Secondary | ICD-10-CM | POA: Diagnosis not present

## 2017-11-02 DIAGNOSIS — Z9111 Patient's noncompliance with dietary regimen: Secondary | ICD-10-CM | POA: Diagnosis not present

## 2017-11-02 DIAGNOSIS — I4891 Unspecified atrial fibrillation: Secondary | ICD-10-CM | POA: Diagnosis not present

## 2017-11-03 ENCOUNTER — Other Ambulatory Visit (HOSPITAL_COMMUNITY): Payer: Self-pay | Admitting: *Deleted

## 2017-11-03 MED ORDER — APIXABAN 5 MG PO TABS: ORAL_TABLET | ORAL | 1 refills | Status: DC

## 2017-11-10 ENCOUNTER — Encounter (INDEPENDENT_AMBULATORY_CARE_PROVIDER_SITE_OTHER): Payer: Self-pay | Admitting: Orthopedic Surgery

## 2017-11-10 ENCOUNTER — Ambulatory Visit (INDEPENDENT_AMBULATORY_CARE_PROVIDER_SITE_OTHER): Payer: Medicare Other | Admitting: Orthopedic Surgery

## 2017-11-10 VITALS — Ht 69.0 in | Wt 303.0 lb

## 2017-11-10 DIAGNOSIS — T8781 Dehiscence of amputation stump: Secondary | ICD-10-CM | POA: Insufficient documentation

## 2017-11-10 DIAGNOSIS — S98112A Complete traumatic amputation of left great toe, initial encounter: Secondary | ICD-10-CM

## 2017-11-10 DIAGNOSIS — IMO0002 Reserved for concepts with insufficient information to code with codable children: Secondary | ICD-10-CM

## 2017-11-10 NOTE — Progress Notes (Signed)
Office Visit Note   Patient: Oscar [name].           Date of Birth: Oct 29, 1945           MRN: [id number] Visit Date: 11/10/2017              Requested by: Josetta Huddle, MD [address] [place], [place] [address] PCP: Josetta Huddle, MD  Chief Complaint  Patient presents with  . Left Foot - Routine Post Op    10/02/17 left foot 1st ray amputation      HPI: Patient is a 72 year old gentleman status post left foot first ray amputation.  Patient is currently on doxycycline he is on Xarelto as well as Trental for microcirculatory improvement.  He has been ordered Silvadene dressing changes this has not been performed yet he is currently on a kneeling scooter he states he fell on his knee today.  Patient is also using the nitroglycerin patch.  Assessment & Plan: Visit Diagnoses:  1. Great toe amputation status, left ([place])   2. Dehiscence of amputation stump ([place])     Plan: We will apply Iodosorb today continue with elevation Dial soap cleansing Silvadene dressing changes daily nitroglycerin patch Trental Xarelto and doxycycline.  Continue nonweightbearing.  Follow-Up Instructions: Return in about 1 week (around 11/17/2017).   Ortho Exam  Patient is alert, oriented, no adenopathy, well-dressed, normal affect, normal respiratory effort. Examination patient has less than dermatitis there is no cellulitis.  The wound edges now have granulation tissue there is still an area of ischemic black eschar in the mid aspect of the incision the distal half of the incision has healed well.  We will continue with wound care the patient's family has applied Steri-Strips this should not be a problem.  Imaging: No results found. No images are attached to the encounter.  Labs: Lab Results  Component Value Date   HGBA1C 10.1 (H) 09/08/2016   HGBA1C 9.1 (H) 08/28/2014   HGBA1C 10.1 (H) 08/07/2013   ESRSEDRATE 75 (H) 08/28/2014   ESRSEDRATE 42 (H) 11/17/2012   CRP 12.9 (H)  08/28/2014   LABURIC 3.0 (L) 08/31/2014   REPTSTATUS 10/03/2017 FINAL 09/30/2017   GRAMSTAIN  09/30/2017    RARE WBC PRESENT, PREDOMINANTLY MONONUCLEAR NO ORGANISMS SEEN    CULT  09/30/2017    NO GROWTH 2 DAYS Performed at [facility], [place] [address] [place],  [address]     @[email or web address] [phone]  Body mass index is 44.75 kg/m.  Orders:  No orders of the defined types were placed in this encounter.  No orders of the defined types were placed in this encounter.    Procedures: No procedures performed  Clinical Data: No additional findings.  ROS:  All other systems negative, except as noted in the HPI. Review of Systems  Objective: Vital Signs: Ht 5\' 9"  (1.753 m)   Wt (!) 303 lb (137.4 kg)   BMI 44.75 kg/m   Specialty Comments:  No specialty comments available.  PMFS History: Patient Active Problem List   Diagnosis Date Noted  . Dehiscence of amputation stump ([place]) 11/10/2017  . Great toe amputation status, left ([place]) 11/10/2017  . Chronic osteomyelitis of toe, left ([place])   . Memory change 04/14/2017  . Chronic ulcer of great toe of left foot, limited to breakdown of skin ([place]) 04/09/2017  . Cutaneous abscess of left foot 04/09/2017  . Non-pressure chronic ulcer of right heel and midfoot limited to breakdown of skin ([place])  10/07/2016  . Influenza A   . AKI (acute kidney injury) ([place]) 09/07/2016  . Acute encephalopathy 09/07/2016  . Acute respiratory failure with hypoxia ([place]) 09/07/2016  . Elevated lactic acid level 09/07/2016  . Elevated troponin 09/07/2016  . Coronary artery calcification seen on CT scan 10/28/2015  . Acute on chronic diastolic (congestive) heart failure ([place]) 09/26/2015  . OSA (obstructive sleep apnea) 02/07/2015  . Sepsis ([place]) 08/28/2014  . Chronic diastolic heart failure ([place]) 09/21/2013  . CVA (cerebral infarction) 08/08/2013  . Dizziness 08/07/2013  . TIA (transient ischemic attack) 08/07/2013  . Atrial  fibrillation ([place]) 08/07/2013  . Hypertension 08/07/2013  . Anemia 08/07/2013  . Dyspnea 06/21/2013  . Chest pain 11/16/2012  . Atrial fibrillation with RVR ([place]) 11/16/2012  . Gout 11/16/2012  . Diabetes mellitus type 2, uncontrolled, with complications ([place]) 83/38/2505  . CKD (chronic kidney disease), stage II 11/16/2012  . Atypical atrial flutter ([place]) 11/16/2012  . Pleuritic chest pain 11/16/2012  . Morbid obesity ([place]) 11/16/2012  . Obstructive sleep apnea 11/16/2012   Past Medical History:  Diagnosis Date  . Anal fissure    h/o - no recent complications  . Arthritis    hips  . Atrial fibrillation ([place])   . Depression   . Diabetes mellitus without complication ([place])    improved after diet/exercise  . Enlarged prostate   . GERD (gastroesophageal reflux disease)   . Gout   . H/O hiatal hernia   . Hx of typhoid fever    as a child  . Hypertension    improved after diet/exercise  . Kidney stones   . Memory change 04/14/2017  . OSA (obstructive sleep apnea)    wears cpap  . Pneumonia   . PONV (postoperative nausea and vomiting)    also difficulty waking up  . Stroke [facility]) 08/2013   short term memory loss (slight)    Family History  Problem Relation Age of Onset  . COPD Father   . Heart attack Sister        died age 56 of MI    Past Surgical History:  Procedure Laterality Date  . AMPUTATION Left 10/02/2017   Procedure: LEFT FOOT 1ST RAY AMPUTATION;  Surgeon: Newt Minion, MD;  Location: [facility];  Service: Orthopedics;  Laterality: Left;  . carpel tunnel Bilateral   . CATARACT EXTRACTION W/ INTRAOCULAR LENS  IMPLANT, BILATERAL    . CHOLECYSTECTOMY    . COLONOSCOPY    . EYE SURGERY     laser surgery   . I&D EXTREMITY Left 09/15/2014   Procedure: Excision Necrotic Left Achilles, Skin Graft, apply Wound VAC;  Surgeon: Newt Minion, MD;  Location: [facility];  Service: Orthopedics;  Laterality: Left;  . KIDNEY STONE SURGERY    . KNEE ARTHROSCOPY Bilateral   . LIGAMENT  REPAIR    . TONSILLECTOMY    . VASECTOMY     Social History   Occupational History  . Occupation: Retired  Tobacco Use  . Smoking status: Never Smoker  . Smokeless tobacco: Never Used  Substance and Sexual Activity  . Alcohol use: Yes    Alcohol/week: 0.0 oz    Comment: rare - 1-2x /yr  . Drug use: No  . Sexual activity: Not on file

## 2017-11-17 ENCOUNTER — Ambulatory Visit (INDEPENDENT_AMBULATORY_CARE_PROVIDER_SITE_OTHER): Payer: Medicare Other | Admitting: Orthopedic Surgery

## 2017-11-17 ENCOUNTER — Encounter (INDEPENDENT_AMBULATORY_CARE_PROVIDER_SITE_OTHER): Payer: Self-pay | Admitting: Orthopedic Surgery

## 2017-11-17 VITALS — Ht 69.0 in | Wt 303.0 lb

## 2017-11-17 DIAGNOSIS — S98112A Complete traumatic amputation of left great toe, initial encounter: Secondary | ICD-10-CM

## 2017-11-17 DIAGNOSIS — T8781 Dehiscence of amputation stump: Secondary | ICD-10-CM

## 2017-11-17 DIAGNOSIS — IMO0002 Reserved for concepts with insufficient information to code with codable children: Secondary | ICD-10-CM

## 2017-11-17 NOTE — Progress Notes (Signed)
Office Visit Note   Patient: Oscar [name].           Date of Birth: [birth year]           MRN: [id number] Visit Date: 11/17/2017              Requested by: Josetta Huddle, MD [address] [place] [address] [place], [place] [address] PCP: Josetta Huddle, MD  Chief Complaint  Patient presents with  . Left Foot - Routine Post Op    10/02/17 left foot 1st ray amputation       HPI: Patient presents in follow-up for dehiscence first ray amputation.  Patient is finishing up doxycycline Silvadene he is using Trental and nitroglycerin patch.  Assessment & Plan: Visit Diagnoses:  1. Great toe amputation status, left ([place])   2. Dehiscence of amputation stump (HCC)     Plan: Continue with current care continue with the kneeling scooter follow-up in 1 week.  Follow-Up Instructions: Return in about 1 week (around 11/24/2017).   Ortho Exam  Patient is alert, oriented, no adenopathy, well-dressed, normal affect, normal respiratory effort. Examination of the distal half of the incision has completely healed approximately he has hyper granulation tissue.  With gentle debridement the nonviable eschar is debrided quite easily.  Silver nitrate was used for hemostasis we will apply Iodosorb compression dressing.  Imaging: No results found. No images are attached to the encounter.  Labs: Lab Results  Component Value Date   HGBA1C 10.1 (H) 09/08/2016   HGBA1C 9.1 (H) 08/28/2014   HGBA1C 10.1 (H) 08/07/2013   ESRSEDRATE 75 (H) 08/28/2014   ESRSEDRATE 42 (H) 11/17/2012   CRP 12.9 (H) 08/28/2014   LABURIC 3.0 (L) 08/31/2014   REPTSTATUS 10/03/2017 FINAL 09/30/2017   GRAMSTAIN  09/30/2017    RARE WBC PRESENT, PREDOMINANTLY MONONUCLEAR NO ORGANISMS SEEN    CULT  09/30/2017    NO GROWTH 2 DAYS Performed at [facility], [place] [address], [place], [place] [address]     @[email or web address] [phone]  Body mass index is 44.75 kg/m.  Orders:  No orders of the defined types were placed in  this encounter.  No orders of the defined types were placed in this encounter.    Procedures: No procedures performed  Clinical Data: No additional findings.  ROS:  All other systems negative, except as noted in the HPI. Review of Systems  Objective: Vital Signs: Ht 5\' 9"  (1.753 m)   Wt (!) 303 lb (137.4 kg)   BMI 44.75 kg/m   Specialty Comments:  No specialty comments available.  PMFS History: Patient Active Problem List   Diagnosis Date Noted  . Dehiscence of amputation stump ([place]) 11/10/2017  . Great toe amputation status, left ([place]) 11/10/2017  . Chronic osteomyelitis of toe, left ([place])   . Memory change 04/14/2017  . Chronic ulcer of great toe of left foot, limited to breakdown of skin ([place]) 04/09/2017  . Cutaneous abscess of left foot 04/09/2017  . Non-pressure chronic ulcer of right heel and midfoot limited to breakdown of skin ([place]) 10/07/2016  . Influenza A   . AKI (acute kidney injury) ([place]) 09/07/2016  . Acute encephalopathy 09/07/2016  . Acute respiratory failure with hypoxia ([place]) 09/07/2016  . Elevated lactic acid level 09/07/2016  . Elevated troponin 09/07/2016  . Coronary artery calcification seen on CT scan 10/28/2015  . Acute on chronic diastolic (congestive) heart failure ([place]) 09/26/2015  . OSA (obstructive sleep apnea) 02/07/2015  . Sepsis ([place]) 08/28/2014  .  Chronic diastolic heart failure ([place]) 09/21/2013  . CVA (cerebral infarction) 08/08/2013  . Dizziness 08/07/2013  . TIA (transient ischemic attack) 08/07/2013  . Atrial fibrillation ([place]) 08/07/2013  . Hypertension 08/07/2013  . Anemia 08/07/2013  . Dyspnea 06/21/2013  . Chest pain 11/16/2012  . Atrial fibrillation with RVR ([place]) 11/16/2012  . Gout 11/16/2012  . Diabetes mellitus type 2, uncontrolled, with complications ([place]) 92/33/0076  . CKD (chronic kidney disease), stage II 11/16/2012  . Atypical atrial flutter ([place]) 11/16/2012  . Pleuritic chest pain 11/16/2012  . Morbid  obesity ([place]) 11/16/2012  . Obstructive sleep apnea 11/16/2012   Past Medical History:  Diagnosis Date  . Anal fissure    h/o - no recent complications  . Arthritis    hips  . Atrial fibrillation ([place])   . Depression   . Diabetes mellitus without complication ([place])    improved after diet/exercise  . Enlarged prostate   . GERD (gastroesophageal reflux disease)   . Gout   . H/O hiatal hernia   . Hx of typhoid fever    as a child  . Hypertension    improved after diet/exercise  . Kidney stones   . Memory change 04/14/2017  . OSA (obstructive sleep apnea)    wears cpap  . Pneumonia   . PONV (postoperative nausea and vomiting)    also difficulty waking up  . Stroke [facility]) 08/2013   short term memory loss (slight)    Family History  Problem Relation Age of Onset  . COPD Father   . Heart attack Sister        died age 85 of MI    Past Surgical History:  Procedure Laterality Date  . AMPUTATION Left 10/02/2017   Procedure: LEFT FOOT 1ST RAY AMPUTATION;  Surgeon: Newt Minion, MD;  Location: [facility];  Service: Orthopedics;  Laterality: Left;  . carpel tunnel Bilateral   . CATARACT EXTRACTION W/ INTRAOCULAR LENS  IMPLANT, BILATERAL    . CHOLECYSTECTOMY    . COLONOSCOPY    . EYE SURGERY     laser surgery   . I&D EXTREMITY Left 09/15/2014   Procedure: Excision Necrotic Left Achilles, Skin Graft, apply Wound VAC;  Surgeon: Newt Minion, MD;  Location: [facility];  Service: Orthopedics;  Laterality: Left;  . KIDNEY STONE SURGERY    . KNEE ARTHROSCOPY Bilateral   . LIGAMENT REPAIR    . TONSILLECTOMY    . VASECTOMY     Social History   Occupational History  . Occupation: Retired  Tobacco Use  . Smoking status: Never Smoker  . Smokeless tobacco: Never Used  Substance and Sexual Activity  . Alcohol use: Yes    Alcohol/week: 0.0 oz    Comment: rare - 1-2x /yr  . Drug use: No  . Sexual activity: Not on file

## 2017-11-26 ENCOUNTER — Ambulatory Visit (INDEPENDENT_AMBULATORY_CARE_PROVIDER_SITE_OTHER): Payer: Medicare Other | Admitting: Orthopedic Surgery

## 2017-11-26 ENCOUNTER — Encounter (INDEPENDENT_AMBULATORY_CARE_PROVIDER_SITE_OTHER): Payer: Self-pay | Admitting: Orthopedic Surgery

## 2017-11-26 DIAGNOSIS — S98112A Complete traumatic amputation of left great toe, initial encounter: Secondary | ICD-10-CM

## 2017-11-26 DIAGNOSIS — IMO0002 Reserved for concepts with insufficient information to code with codable children: Secondary | ICD-10-CM

## 2017-11-26 DIAGNOSIS — T8781 Dehiscence of amputation stump: Secondary | ICD-10-CM

## 2017-11-26 NOTE — Progress Notes (Signed)
Office Visit Note   Patient: Oscar [name].           Date of Birth: [birth year]           MRN: [id number] Visit Date: 11/26/2017              Requested by: Josetta Huddle, MD [address] [place], [place] [address] PCP: Josetta Huddle, MD  Chief Complaint  Patient presents with  . Left Foot - Pain, Follow-up      HPI: Patient is we will harvest the sutures today the 2 months status post left foot first ray amputation.  The distal aspect of the incision has healed nicely proximately he has hyper granulation tissue.  Assessment & Plan: Visit Diagnoses:  1. Great toe amputation status, left ([place])   2. Dehiscence of amputation stump (HCC)     Plan: Harvest the sutures today the fibrinous tissue was debrided there is 100% beefy granulation tissue this was touched with silver nitrate Iodosorb gauze and an Ace wrap was applied.  Follow-Up Instructions: Return in about 2 weeks (around 12/10/2017).   Ortho Exam  Patient is alert, oriented, no adenopathy, well-dressed, normal affect, normal respiratory effort. Examination there is no redness no cellulitis patient is continuing with the nitroglycerin patch he is not on Trental he is on Eliquis.  We will continue with the wound care with Silvadene continue with a kneeling scooter with nonweightbearing.  The ulcer is 10 x 30 mm with proud granulation tissue.  Imaging: No results found. No images are attached to the encounter.  Labs: Lab Results  Component Value Date   HGBA1C 10.1 (H) 09/08/2016   HGBA1C 9.1 (H) 08/28/2014   HGBA1C 10.1 (H) 08/07/2013   ESRSEDRATE 75 (H) 08/28/2014   ESRSEDRATE 42 (H) 11/17/2012   CRP 12.9 (H) 08/28/2014   LABURIC 3.0 (L) 08/31/2014   REPTSTATUS 10/03/2017 FINAL 09/30/2017   GRAMSTAIN  09/30/2017    RARE WBC PRESENT, PREDOMINANTLY MONONUCLEAR NO ORGANISMS SEEN    CULT  09/30/2017    NO GROWTH 2 DAYS Performed at [facility], [place] [address], [place], [place] [address]     @LABSALLVALUES (HGBA1)@  There is no height or weight on file to calculate BMI.  Orders:  No orders of the defined types were placed in this encounter.  No orders of the defined types were placed in this encounter.    Procedures: No procedures performed  Clinical Data: No additional findings.  ROS:  All other systems negative, except as noted in the HPI. Review of Systems  Objective: Vital Signs: There were no vitals taken for this visit.  Specialty Comments:  No specialty comments available.  PMFS History: Patient Active Problem List   Diagnosis Date Noted  . Dehiscence of amputation stump ([place]) 11/10/2017  . Great toe amputation status, left ([place]) 11/10/2017  . Chronic osteomyelitis of toe, left ([place])   . Memory change 04/14/2017  . Chronic ulcer of great toe of left foot, limited to breakdown of skin ([place]) 04/09/2017  . Cutaneous abscess of left foot 04/09/2017  . Non-pressure chronic ulcer of right heel and midfoot limited to breakdown of skin ([place]) 10/07/2016  . Influenza A   . AKI (acute kidney injury) ([place]) 09/07/2016  . Acute encephalopathy 09/07/2016  . Acute respiratory failure with hypoxia ([place]) 09/07/2016  . Elevated lactic acid level 09/07/2016  . Elevated troponin 09/07/2016  . Coronary artery calcification seen on CT scan 10/28/2015  . Acute on  chronic diastolic (congestive) heart failure ([place]) 09/26/2015  . OSA (obstructive sleep apnea) 02/07/2015  . Sepsis ([place]) 08/28/2014  . Chronic diastolic heart failure ([place]) 09/21/2013  . CVA (cerebral infarction) 08/08/2013  . Dizziness 08/07/2013  . TIA (transient ischemic attack) 08/07/2013  . Atrial fibrillation ([place]) 08/07/2013  . Hypertension 08/07/2013  . Anemia 08/07/2013  . Dyspnea 06/21/2013  . Chest pain 11/16/2012  . Atrial fibrillation with RVR ([place]) 11/16/2012  . Gout 11/16/2012  . Diabetes mellitus type 2, uncontrolled, with complications ([place]) 37/90/2409  . CKD (chronic kidney  disease), stage II 11/16/2012  . Atypical atrial flutter ([place]) 11/16/2012  . Pleuritic chest pain 11/16/2012  . Morbid obesity ([place]) 11/16/2012  . Obstructive sleep apnea 11/16/2012   Past Medical History:  Diagnosis Date  . Anal fissure    h/o - no recent complications  . Arthritis    hips  . Atrial fibrillation ([place])   . Depression   . Diabetes mellitus without complication ([place])    improved after diet/exercise  . Enlarged prostate   . GERD (gastroesophageal reflux disease)   . Gout   . H/O hiatal hernia   . Hx of typhoid fever    as a child  . Hypertension    improved after diet/exercise  . Kidney stones   . Memory change 04/14/2017  . OSA (obstructive sleep apnea)    wears cpap  . Pneumonia   . PONV (postoperative nausea and vomiting)    also difficulty waking up  . Stroke [facility]) 08/2013   short term memory loss (slight)    Family History  Problem Relation Age of Onset  . COPD Father   . Heart attack Sister        died age 52 of MI    Past Surgical History:  Procedure Laterality Date  . AMPUTATION Left 10/02/2017   Procedure: LEFT FOOT 1ST RAY AMPUTATION;  Surgeon: Newt Minion, MD;  Location: [facility];  Service: Orthopedics;  Laterality: Left;  . carpel tunnel Bilateral   . CATARACT EXTRACTION W/ INTRAOCULAR LENS  IMPLANT, BILATERAL    . CHOLECYSTECTOMY    . COLONOSCOPY    . EYE SURGERY     laser surgery   . I&D EXTREMITY Left 09/15/2014   Procedure: Excision Necrotic Left Achilles, Skin Graft, apply Wound VAC;  Surgeon: Newt Minion, MD;  Location: [facility];  Service: Orthopedics;  Laterality: Left;  . KIDNEY STONE SURGERY    . KNEE ARTHROSCOPY Bilateral   . LIGAMENT REPAIR    . TONSILLECTOMY    . VASECTOMY     Social History   Occupational History  . Occupation: Retired  Tobacco Use  . Smoking status: Never Smoker  . Smokeless tobacco: Never Used  Substance and Sexual Activity  . Alcohol use: Yes    Alcohol/week: 0.0 oz    Comment: rare - 1-2x /yr  .  Drug use: No  . Sexual activity: Not on file

## 2017-12-10 ENCOUNTER — Encounter (INDEPENDENT_AMBULATORY_CARE_PROVIDER_SITE_OTHER): Payer: Self-pay | Admitting: Orthopedic Surgery

## 2017-12-10 ENCOUNTER — Ambulatory Visit (INDEPENDENT_AMBULATORY_CARE_PROVIDER_SITE_OTHER): Payer: Medicare Other | Admitting: Orthopedic Surgery

## 2017-12-10 VITALS — Ht 69.0 in | Wt 303.0 lb

## 2017-12-10 DIAGNOSIS — IMO0002 Reserved for concepts with insufficient information to code with codable children: Secondary | ICD-10-CM

## 2017-12-10 DIAGNOSIS — S98112A Complete traumatic amputation of left great toe, initial encounter: Secondary | ICD-10-CM

## 2017-12-10 NOTE — Progress Notes (Signed)
Office Visit Note   Patient: Oscar [name].           Date of Birth: 04/22/1946           MRN: [id number] Visit Date: 12/10/2017              Requested by: Josetta Huddle, MD [address] [place], [place] [address] PCP: Josetta Huddle, MD  Chief Complaint  Patient presents with  . Left Foot - Routine Post Op    10/02/17 left foot 1st ray amputation       HPI: Patient is a 72 year old gentleman who presents 9 weeks status post left foot first ray amputation.  He also has venous insufficiency.  Patient did have slow delayed healing with wound dehiscence he has been using a nitroglycerin patch.  Assessment & Plan: Visit Diagnoses:  1. Great toe amputation status, left (HCC)     Plan: Examination the wound is essentially completely healed at this time there is one area of superficial epithelialization that is about 3 x 5 mm and 0.1 mm deep.  Patient was recommended to resume wearing his medical compression stockings still minimize his weightbearing still work on elevation and compression.  Anticipate released without restrictions at follow-up.  Follow-Up Instructions: Return in about 3 weeks (around 12/31/2017).   Ortho Exam  Patient is alert, oriented, no adenopathy, well-dressed, normal affect, normal respiratory effort. Examination the wound is healed quite nicely.  There is no redness no cellulitis he does have pitting edema from the venous insufficiency up to the tibial tubercle there is no cellulitis in the foot no drainage  Imaging: No results found. No images are attached to the encounter.  Labs: Lab Results  Component Value Date   HGBA1C 10.1 (H) 09/08/2016   HGBA1C 9.1 (H) 08/28/2014   HGBA1C 10.1 (H) 08/07/2013   ESRSEDRATE 75 (H) 08/28/2014   ESRSEDRATE 42 (H) 11/17/2012   CRP 12.9 (H) 08/28/2014   LABURIC 3.0 (L) 08/31/2014   REPTSTATUS 10/03/2017 FINAL 09/30/2017   GRAMSTAIN  09/30/2017    RARE WBC PRESENT, PREDOMINANTLY MONONUCLEAR NO  ORGANISMS SEEN    CULT  09/30/2017    NO GROWTH 2 DAYS Performed at [facility], [place] [address] [place], [place] [address]     Lab Results  Component Value Date/Time   HGBA1C 10.1 (H) 09/08/2016 01:07 AM   HGBA1C 9.1 (H) 08/28/2014 07:15 AM   HGBA1C 10.1 (H) 08/07/2013 06:10 AM    Body mass index is 44.75 kg/m.  Orders:  No orders of the defined types were placed in this encounter.  No orders of the defined types were placed in this encounter.    Procedures: No procedures performed  Clinical Data: No additional findings.  ROS:  All other systems negative, except as noted in the HPI. Review of Systems  Objective: Vital Signs: Ht 5\' 9"  (1.753 m)   Wt (!) 303 lb (137.4 kg)   BMI 44.75 kg/m   Specialty Comments:  No specialty comments available.  PMFS History: Patient Active Problem List   Diagnosis Date Noted  . Dehiscence of amputation stump ([place]) 11/10/2017  . Great toe amputation status, left ([place]) 11/10/2017  . Chronic osteomyelitis of toe, left ([place])   . Memory change 04/14/2017  . Chronic ulcer of great toe of left foot, limited to breakdown of skin ([place]) 04/09/2017  . Cutaneous abscess of left foot 04/09/2017  . Non-pressure chronic ulcer of right heel and midfoot limited to  breakdown of skin ([place]) 10/07/2016  . Influenza A   . AKI (acute kidney injury) ([place]) 09/07/2016  . Acute encephalopathy 09/07/2016  . Acute respiratory failure with hypoxia ([place]) 09/07/2016  . Elevated lactic acid level 09/07/2016  . Elevated troponin 09/07/2016  . Coronary artery calcification seen on CT scan 10/28/2015  . Acute on chronic diastolic (congestive) heart failure ([place]) 09/26/2015  . OSA (obstructive sleep apnea) 02/07/2015  . Sepsis ([place]) 08/28/2014  . Chronic diastolic heart failure ([place]) 09/21/2013  . CVA (cerebral infarction) 08/08/2013  . Dizziness 08/07/2013  . TIA (transient ischemic attack) 08/07/2013  . Atrial fibrillation ([place]) 08/07/2013  .  Hypertension 08/07/2013  . Anemia 08/07/2013  . Dyspnea 06/21/2013  . Chest pain 11/16/2012  . Atrial fibrillation with RVR ([place]) 11/16/2012  . Gout 11/16/2012  . Diabetes mellitus type 2, uncontrolled, with complications ([place]) 12/87/8676  . CKD (chronic kidney disease), stage II 11/16/2012  . Atypical atrial flutter ([place]) 11/16/2012  . Pleuritic chest pain 11/16/2012  . Morbid obesity ([place]) 11/16/2012  . Obstructive sleep apnea 11/16/2012   Past Medical History:  Diagnosis Date  . Anal fissure    h/o - no recent complications  . Arthritis    hips  . Atrial fibrillation ([place])   . Depression   . Diabetes mellitus without complication ([place])    improved after diet/exercise  . Enlarged prostate   . GERD (gastroesophageal reflux disease)   . Gout   . H/O hiatal hernia   . Hx of typhoid fever    as a child  . Hypertension    improved after diet/exercise  . Kidney stones   . Memory change 04/14/2017  . OSA (obstructive sleep apnea)    wears cpap  . Pneumonia   . PONV (postoperative nausea and vomiting)    also difficulty waking up  . Stroke [facility]) 08/2013   short term memory loss (slight)    Family History  Problem Relation Age of Onset  . COPD Father   . Heart attack Sister        died age 69 of MI    Past Surgical History:  Procedure Laterality Date  . AMPUTATION Left 10/02/2017   Procedure: LEFT FOOT 1ST RAY AMPUTATION;  Surgeon: Newt Minion, MD;  Location: [facility];  Service: Orthopedics;  Laterality: Left;  . carpel tunnel Bilateral   . CATARACT EXTRACTION W/ INTRAOCULAR LENS  IMPLANT, BILATERAL    . CHOLECYSTECTOMY    . COLONOSCOPY    . EYE SURGERY     laser surgery   . I&D EXTREMITY Left 09/15/2014   Procedure: Excision Necrotic Left Achilles, Skin Graft, apply Wound VAC;  Surgeon: Newt Minion, MD;  Location: [facility];  Service: Orthopedics;  Laterality: Left;  . KIDNEY STONE SURGERY    . KNEE ARTHROSCOPY Bilateral   . LIGAMENT REPAIR    . TONSILLECTOMY    .  VASECTOMY     Social History   Occupational History  . Occupation: Retired  Tobacco Use  . Smoking status: Never Smoker  . Smokeless tobacco: Never Used  Substance and Sexual Activity  . Alcohol use: Yes    Alcohol/week: 0.0 oz    Comment: rare - 1-2x /yr  . Drug use: No  . Sexual activity: Not on file

## 2017-12-14 ENCOUNTER — Ambulatory Visit (HOSPITAL_COMMUNITY)
Admission: RE | Admit: 2017-12-14 | Discharge: 2017-12-14 | Disposition: A | Discharge status: Home / Self Care | Payer: Medicare Other | Source: Ambulatory Visit | Attending: Internal Medicine | Admitting: Internal Medicine

## 2017-12-14 ENCOUNTER — Other Ambulatory Visit: Payer: Self-pay

## 2017-12-14 ENCOUNTER — Encounter (HOSPITAL_COMMUNITY): Payer: Self-pay | Admitting: Internal Medicine

## 2017-12-14 VITALS — BP 134/74 | HR 91 | Wt 289.8 lb

## 2017-12-14 DIAGNOSIS — Z794 Long term (current) use of insulin: Secondary | ICD-10-CM | POA: Diagnosis not present

## 2017-12-14 DIAGNOSIS — I2510 Atherosclerotic heart disease of native coronary artery without angina pectoris: Secondary | ICD-10-CM | POA: Diagnosis not present

## 2017-12-14 DIAGNOSIS — I482 Chronic atrial fibrillation, unspecified: Secondary | ICD-10-CM

## 2017-12-14 DIAGNOSIS — N182 Chronic kidney disease, stage 2 (mild): Secondary | ICD-10-CM

## 2017-12-14 DIAGNOSIS — E119 Type 2 diabetes mellitus without complications: Secondary | ICD-10-CM | POA: Insufficient documentation

## 2017-12-14 DIAGNOSIS — N400 Enlarged prostate without lower urinary tract symptoms: Secondary | ICD-10-CM | POA: Insufficient documentation

## 2017-12-14 DIAGNOSIS — M109 Gout, unspecified: Secondary | ICD-10-CM | POA: Insufficient documentation

## 2017-12-14 DIAGNOSIS — G4733 Obstructive sleep apnea (adult) (pediatric): Secondary | ICD-10-CM

## 2017-12-14 DIAGNOSIS — E1165 Type 2 diabetes mellitus with hyperglycemia: Secondary | ICD-10-CM

## 2017-12-14 DIAGNOSIS — F329 Major depressive disorder, single episode, unspecified: Secondary | ICD-10-CM | POA: Insufficient documentation

## 2017-12-14 DIAGNOSIS — Z825 Family history of asthma and other chronic lower respiratory diseases: Secondary | ICD-10-CM | POA: Insufficient documentation

## 2017-12-14 DIAGNOSIS — Z8249 Family history of ischemic heart disease and other diseases of the circulatory system: Secondary | ICD-10-CM | POA: Insufficient documentation

## 2017-12-14 DIAGNOSIS — Z8673 Personal history of transient ischemic attack (TIA), and cerebral infarction without residual deficits: Secondary | ICD-10-CM | POA: Insufficient documentation

## 2017-12-14 DIAGNOSIS — I5032 Chronic diastolic (congestive) heart failure: Secondary | ICD-10-CM | POA: Diagnosis not present

## 2017-12-14 DIAGNOSIS — I5030 Unspecified diastolic (congestive) heart failure: Secondary | ICD-10-CM | POA: Diagnosis present

## 2017-12-14 DIAGNOSIS — Z79899 Other long term (current) drug therapy: Secondary | ICD-10-CM | POA: Diagnosis not present

## 2017-12-14 DIAGNOSIS — Z8631 Personal history of diabetic foot ulcer: Secondary | ICD-10-CM | POA: Diagnosis not present

## 2017-12-14 DIAGNOSIS — IMO0002 Reserved for concepts with insufficient information to code with codable children: Secondary | ICD-10-CM

## 2017-12-14 DIAGNOSIS — Z87442 Personal history of urinary calculi: Secondary | ICD-10-CM | POA: Insufficient documentation

## 2017-12-14 DIAGNOSIS — Z91041 Radiographic dye allergy status: Secondary | ICD-10-CM | POA: Diagnosis not present

## 2017-12-14 DIAGNOSIS — R413 Other amnesia: Secondary | ICD-10-CM | POA: Diagnosis not present

## 2017-12-14 DIAGNOSIS — E118 Type 2 diabetes mellitus with unspecified complications: Secondary | ICD-10-CM | POA: Diagnosis not present

## 2017-12-14 DIAGNOSIS — K219 Gastro-esophageal reflux disease without esophagitis: Secondary | ICD-10-CM | POA: Diagnosis not present

## 2017-12-14 DIAGNOSIS — I110 Hypertensive heart disease with heart failure: Secondary | ICD-10-CM | POA: Insufficient documentation

## 2017-12-14 DIAGNOSIS — Z7901 Long term (current) use of anticoagulants: Secondary | ICD-10-CM | POA: Diagnosis not present

## 2017-12-14 DIAGNOSIS — Z89412 Acquired absence of left great toe: Secondary | ICD-10-CM | POA: Diagnosis not present

## 2017-12-14 DIAGNOSIS — Z6841 Body Mass Index (BMI) 40.0 and over, adult: Secondary | ICD-10-CM | POA: Insufficient documentation

## 2017-12-14 LAB — BASIC METABOLIC PANEL
ANION GAP: 12 (ref 5–15)
BUN: 31 mg/dL — ABNORMAL HIGH (ref 6–20)
CALCIUM: 9.6 mg/dL (ref 8.9–10.3)
CO2: 28 mmol/L (ref 22–32)
Chloride: 100 mmol/L — ABNORMAL LOW (ref 101–111)
Creatinine, Ser: 1.55 mg/dL — ABNORMAL HIGH (ref 0.61–1.24)
GFR calc Af Amer: 50 mL/min — ABNORMAL LOW (ref >60)
GFR calc non Af Amer: 43 mL/min — ABNORMAL LOW (ref >60)
GLUCOSE: 223 mg/dL — AB (ref 65–99)
Potassium: 4.3 mmol/L (ref 3.5–5.1)
Sodium: 140 mmol/L (ref 135–145)

## 2017-12-14 NOTE — Patient Instructions (Signed)
Lab today  We will contact you in 9 months to schedule your next appointment.  

## 2017-12-14 NOTE — Progress Notes (Signed)
Dictation #1 [id number]  [id number] Patient ID: Oscar Almquist., male   DOB: 1946/08/01, 72 y.o.   MRN: [id number]    Advanced Heart Failure Clinic Note   Referring Physician: Lovena Le   HPI: Oscar [name] is a 72 y.o. male with h/o morbid obesity, DM2, HTN, permanent AF, OSA on CPAP and diastolic HF. Referred by Dr. Lovena Le for further evaluation of worsening dyspnea.   Developed PAF several years ago. He has been in AF since that time and has been relatively asymptomatic. February 2016 had a diabetic foot ulcer on the right heel with gangrene. Was operated on by Dr. Sharol [name] and had limited mobility for over 8 months with only bed to chair activity.   He presents today for regular follow up. Had L great toe amputation by Dr. Sharol [name] about 2 months ago. Now off of "knee scooter" and getting around better. Denies SOB with ADLs. No lightheadedness or dizziness. No palpitations or CP. He has mild orthopnea if he tries to sleep flat, but uses one pillow and sleeps on his side just fine. No PND. Feels good. Stays active.  Taking all medications as directed. Using metolazone 1-2 times a month. BP up slightly, but contributes this to his mother passing away last week and dealing with settling her estate.   Studies: Last echo 11/16 - Left ventricle: The cavity size was normal. Systolic function was normal. The estimated ejection fraction was in the range of 55% to 60%. Wall motion was normal; there were no regional wall motion abnormalities. - Aortic valve: There was trivial regurgitation. - Mitral valve: Calcified annulus. There was mild regurgitation. - Left atrium: The atrium was moderately dilated. - Right atrium: The atrium was mildly dilated. - Atrial septum: No defect or patent foramen ovale was identified. - Pulmonary arteries: PA peak pressure: 37 mm Hg (S).  CPX 2/17  FVC 2.11 (53%)    FEV1 1.55 (50%)     FEV1/FVC 74 (95%)     MVV 76 (61%)  Resting HR: 107 Peak  HR: 136  (91% age predicted max HR) BP rest: 136/70 BP peak: 146/68 Peak VO2: 10.4 (56% predicted peak VO2) VE/VCO2 slope: 29.8 OUES: 1 Peak RER: 1.14 Ventilatory Threshold: 7.9 (42.5% predicted or measured peak VO2) Peak RR 24 Peak Ventilation: 45.9 VE/MVV: 60% PETCO2 at peak: 36 O2pulse: 10  (59% predicted O2pulse)  Conclusion: Exercise testing with gas exchange demonstrates a severe functional impairment when compared to matched sedentary norms. The limitation appears to be primarily due to the patients severe obesity and related restrictive lung physiology. However, the blunted BP response to exercise and low O2 pulse suggest a concomitant circulatory limitation which is likely related to his diastolic HF and atrial fibrillation.    Past Medical History:  Diagnosis Date  . Anal fissure    h/o - no recent complications  . Arthritis    hips  . Atrial fibrillation ([place])   . Depression   . Diabetes mellitus without complication ([place])    improved after diet/exercise  . Enlarged prostate   . GERD (gastroesophageal reflux disease)   . Gout   . H/O hiatal hernia   . Hx of typhoid fever    as a child  . Hypertension    improved after diet/exercise  . Kidney stones   . Memory change 04/14/2017  . OSA (obstructive sleep apnea)    wears cpap  . Pneumonia   . PONV (postoperative nausea and vomiting)    also difficulty waking  up  . Stroke [facility]) 08/2013   short term memory loss (slight)    Current Outpatient Medications  Medication Sig Dispense Refill  . acetaminophen (TYLENOL) 500 MG tablet Take 1,500 mg by mouth every 6 (six) hours as needed for moderate pain. Reported on 10/23/2015    . apixaban (ELIQUIS) 5 MG TABS tablet TAKE 1 TABLET (5 MG TOTAL) BY MOUTH 2 (TWO) TIMES DAILY. 60 tablet 1  . B-D INS SYRINGE 0.5CC/30GX1/2" 30G X 1/2" 0.5 ML MISC USE THREE TIMES A DAY DX-E11.65 INJECTION  2  . blood glucose meter kit and supplies KIT Dispense based on patient and  insurance preference. Use up to four times daily as directed. (FOR ICD-9 250.00, 250.01). 1 each 0  . buPROPion (WELLBUTRIN SR) 150 MG 12 hr tablet Take 150 mg by mouth daily.  5  . diltiazem (CARDIZEM CD) 360 MG 24 hr capsule Take 1 capsule (360 mg total) by mouth daily. 90 capsule 0  . doxycycline (VIBRA-TABS) 100 MG tablet Take 1 tablet (100 mg total) by mouth 2 (two) times daily. 60 tablet 0  . Febuxostat (ULORIC) 80 MG TABS Take 80 mg by mouth daily.    . furosemide (LASIX) 80 MG tablet Take 1 tablet (80 mg total) by mouth daily. T 90 tablet 3  . glyBURIDE (DIABETA) 5 MG tablet     . guaiFENesin-dextromethorphan (ROBITUSSIN DM) 100-10 MG/5ML syrup Take 5 mLs by mouth every 4 (four) hours as needed for cough. 118 mL 0  . indomethacin (INDOCIN) 50 MG capsule Take 1 capsule (50 mg total) by mouth 3 (three) times daily as needed (for gout flare ups). Reported on 12/27/2015 90 capsule 1  . indomethacin (INDOCIN) 50 MG capsule Take 1 capsule (50 mg total) by mouth 3 (three) times daily as needed (for gout flare ups). Take 1 capsule by mouth 3 times daily as needed for gout flares 90 capsule 3  . insulin aspart (NOVOLOG) 100 UNIT/ML injection Inject 50 Units into the skin 3 (three) times daily as needed for high blood sugar.     Marland Kitchen LEVEMIR 100 UNIT/ML injection Inject 50 Units into the skin at bedtime.   5  . metformin (FORTAMET) 1000 MG (OSM) 24 hr tablet Take 1,000 mg by mouth 2 (two) times daily.  2  . metolazone (ZAROXOLYN) 2.5 MG tablet Take 1 tablet every Wednesday. 10 tablet 3  . oxyCODONE-acetaminophen (PERCOCET/ROXICET) 5-325 MG tablet Take 1 tablet by mouth every 4 (four) hours as needed for severe pain. 60 tablet 0  . pantoprazole (PROTONIX) 40 MG tablet Take 1 tablet (40 mg total) by mouth daily. 30 tablet 1  . pentoxifylline (TRENTAL) 400 MG CR tablet Take 1 tablet (400 mg total) by mouth 3 (three) times daily with meals. 90 tablet 3  . potassium chloride SA (K-DUR,KLOR-CON) 20 MEQ tablet  Take 2 tablets (40 mEq total) by mouth daily. (Patient taking differently: Take 20 mEq by mouth 2 (two) times daily. ) 180 tablet 3  . tamsulosin (FLOMAX) 0.4 MG CAPS capsule Take 1 capsule (0.4 mg total) by mouth daily after breakfast. (Patient taking differently: Take 0.4 mg by mouth 2 (two) times daily. ) 30 capsule 0  . mupirocin ointment (BACTROBAN) 2 % Apply 1 application topically 2 (two) times daily. Apply to the affected area 2 times a day 22 g 3   No current facility-administered medications for this encounter.     Allergies  Allergen Reactions  . Iohexol      Desc:  HIVES S/P 13HR.PREMEDS, ?LOW DOSAGE PREMEDS   . Ivp Dye [Iodinated Diagnostic Agents]     welps       Social History   Socioeconomic History  . Marital status: Married    Spouse name: Oscar [name]"  . Number of children: 3  . Years of education: 100  . Highest education level: Not on file  Occupational History  . Occupation: Retired  Scientific laboratory technician  . Financial resource strain: Not on file  . Food insecurity:    Worry: Not on file    Inability: Not on file  . Transportation needs:    Medical: Not on file    Non-medical: Not on file  Tobacco Use  . Smoking status: Never Smoker  . Smokeless tobacco: Never Used  Substance and Sexual Activity  . Alcohol use: Yes    Alcohol/week: 0.0 oz    Comment: rare - 1-2x /yr  . Drug use: No  . Sexual activity: Not on file  Lifestyle  . Physical activity:    Days per week: Not on file    Minutes per session: Not on file  . Stress: Not on file  Relationships  . Social connections:    Talks on phone: Not on file    Gets together: Not on file    Attends religious service: Not on file    Active member of club or organization: Not on file    Attends meetings of clubs or organizations: Not on file    Relationship status: Not on file  . Intimate partner violence:    Fear of current or ex partner: Not on file    Emotionally abused: Not on file    Physically  abused: Not on file    Forced sexual activity: Not on file  Other Topics Concern  . Not on file  Social History Narrative   Lives with wife   Caffeine use: Tea sometimes (unsweet)   Right handed       Family History  Problem Relation Age of Onset  . COPD Father   . Heart attack Sister        died age 64 of MI    Vitals:   12/14/17 1041  BP: 134/74  Pulse: 91  SpO2: 95%  Weight: 289 lb 12.8 oz (131.5 kg)   Wt Readings from Last 3 Encounters:  12/14/17 289 lb 12.8 oz (131.5 kg)  12/10/17 (!) 303 lb (137.4 kg)  11/17/17 (!) 303 lb (137.4 kg)     PHYSICAL EXAM: General: Well appearing. No resp difficulty. HEENT: Normal. Anicteric  Neck: Supple. JVP 5-6. Carotids 2+ bilat; no bruits. No thyromegaly or nodule noted. Cor: PMI nondisplaced. Irregular, No M/G/R noted  Lungs: CTAB, normal effort. No wheeze Abdomen: Obese, soft, non-tender, non-distended, no HSM. No bruits or masses. +BS  Extremities: No cyanosis, clubbing, or rash. R and LLE no edema. Orthopedic sock on left Neuro: alert & oriented x 3, cranial nerves grossly intact. moves all 4 extremities w/o difficulty. Affect pleasant   ASSESSMENT & PLAN: 1. Chronic diastolic HF - Echo 93/8182 55-60%, Trivial AI, Mild MR, Mod LAE, Mild RAE. PA peak pressure 37 mm Hg.  - NYHA II symptoms - Volume status stable on exam.   - Continue lasix 80 mg daily - Takes metolazone as needed, approx 1-2 times monthly.  - Reinforced fluid restriction to < 2 L daily, sodium restriction to less than 2000 mg daily, and the importance of daily weights.   2. Morbid obesity  Body mass index is 42.8 kg/m. Discussed weight loss 3. DM2 - Per primary.  - s/p Left great toe amputation by Dr. Sharol [name] 4. Diffuse coronary calcifications on CT chest - No s/s of ischemia.    5. Chronic AF - Rate controlled on EKG today.  - Regular on exam - Continue eliquis 5 mg BID.  6. HTN - On diltiazem 360 mg daily.  Doing well overall. BMET today.    Shirley Friar, PA-C  10:48 AM   Patient seen and examined with the above-signed Advanced Practice Provider and/or Housestaff. I personally reviewed laboratory data, imaging studies and relevant notes. I independently examined the patient and formulated the important aspects of the plan. I have edited the note to reflect any of my changes or salient points. I have personally discussed the plan with the patient and/or family.  Doing very well. Volume status well controlled. No edema or JVP on exam. Lungs clear. NYHA II. Good use of sliding scale diuretic. AF is chronic and well rate-controlled. Continue Eliquis. Can f/u in 9 months.   Glori Bickers, MD  11:50 AM

## 2017-12-15 IMAGING — US US ABDOMEN COMPLETE
1 series · 13 of 25 positions shown · non-contrast
Comparison: Abdominal CT 05/24/2010

CLINICAL DATA: Elevated LFTs.  Nausea today.  Lactic acidosis.

EXAM:
ABDOMEN ULTRASOUND COMPLETE

[Series 1: us abdomen complete · 0.33mm/px · 13 of 66 slices shown]
[im 1/66]
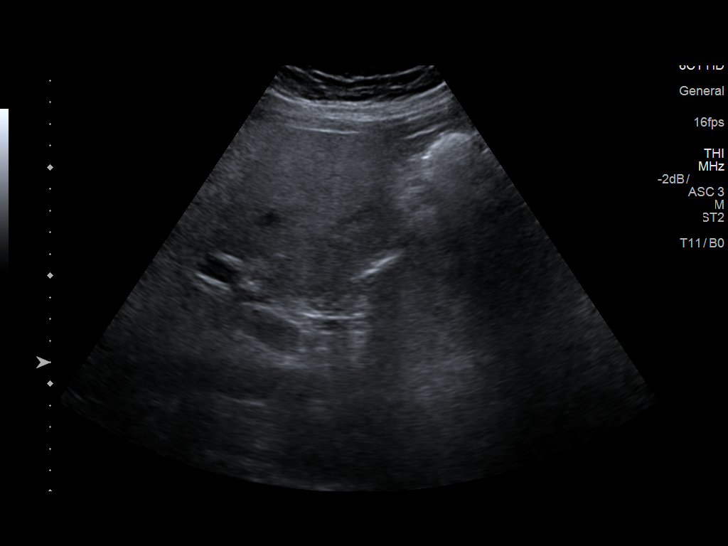
[im 6/66]
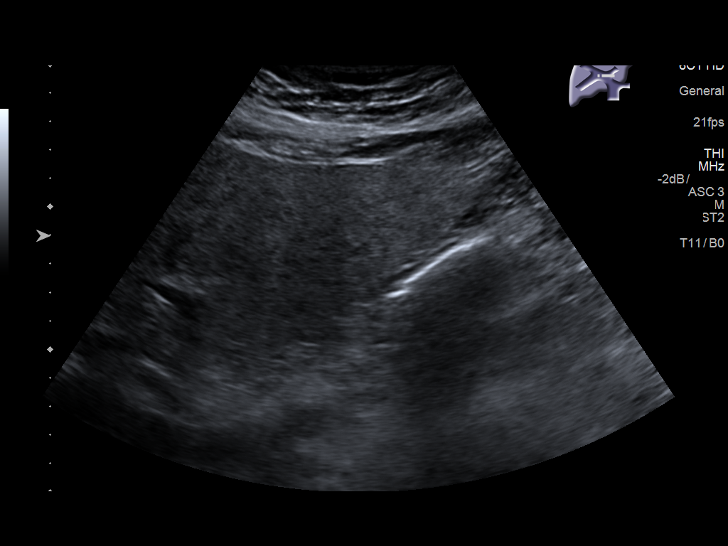
[im 11/66]
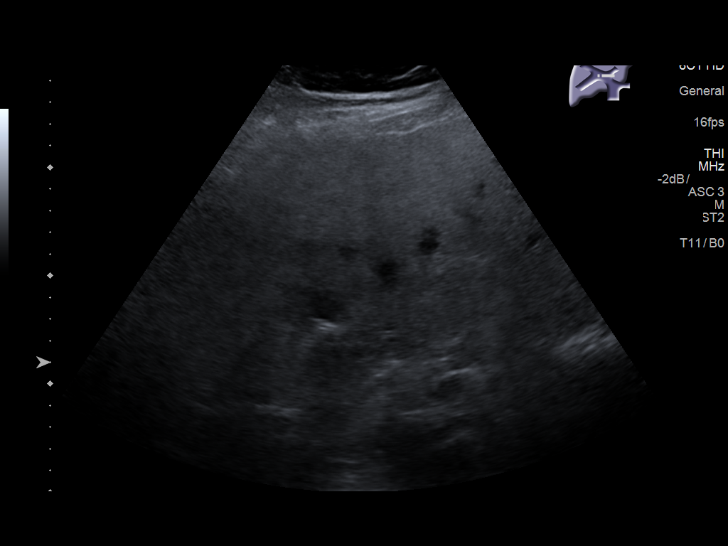
[im 17/66]
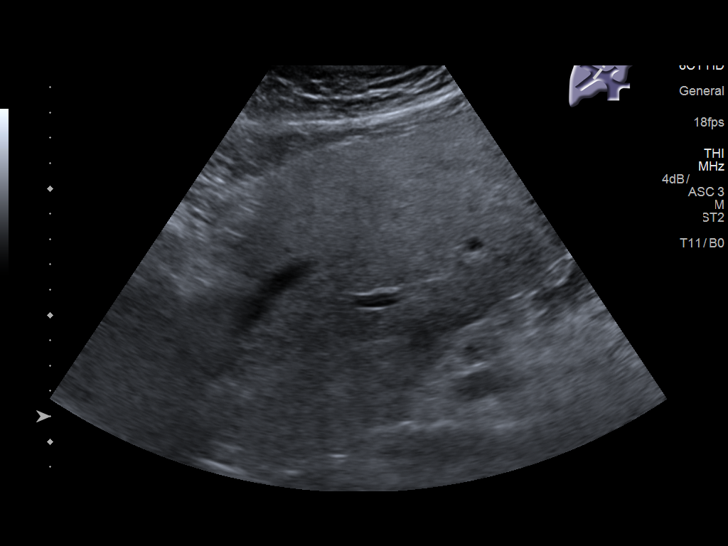
[im 22/66]
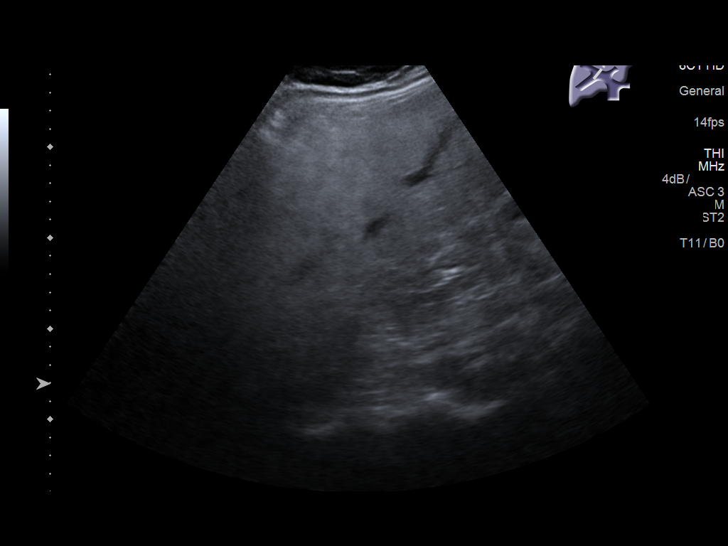
[im 28/66]
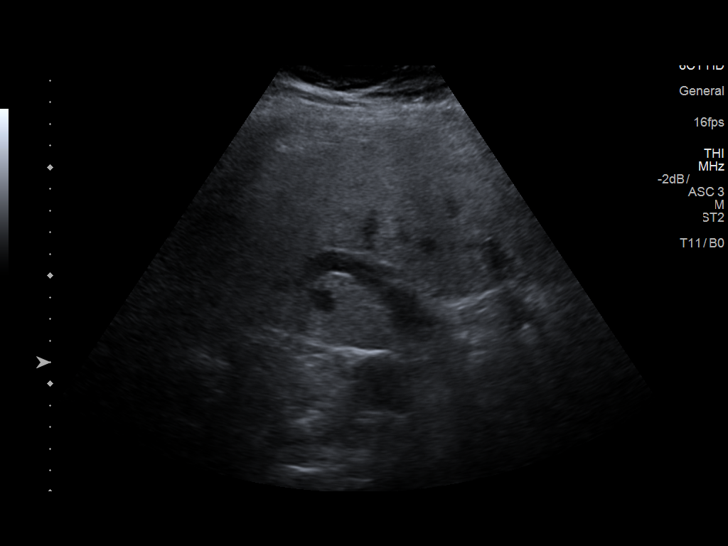
[im 33/66]
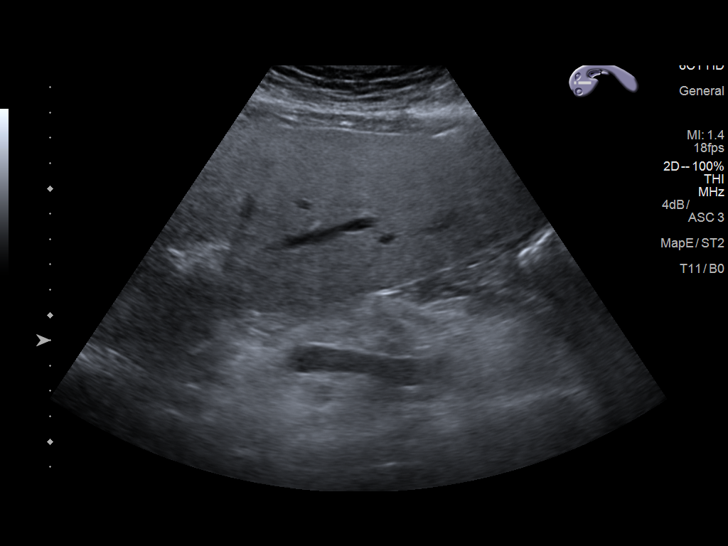
[im 38/66]
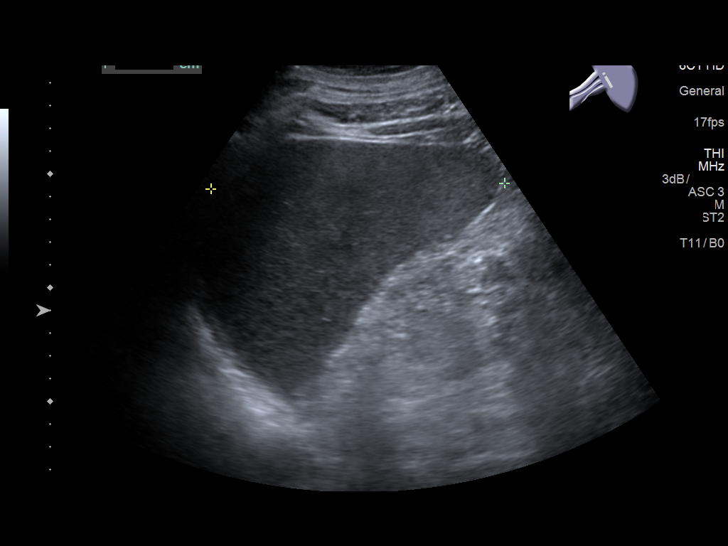
[im 44/66]
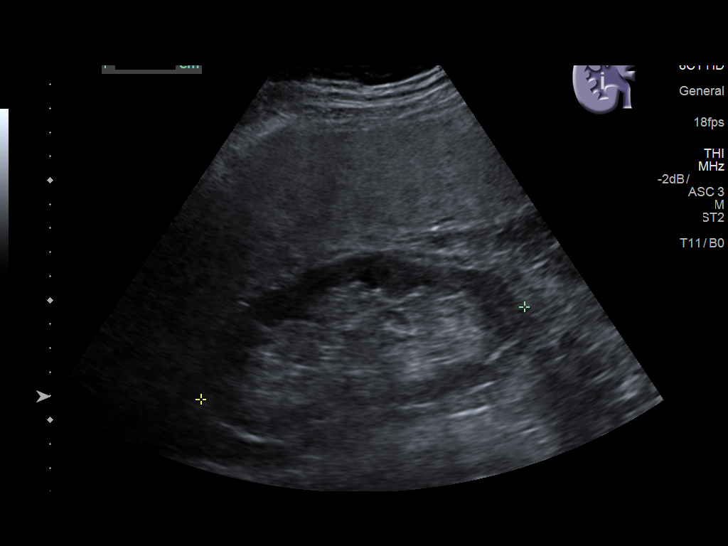
[im 49/66]
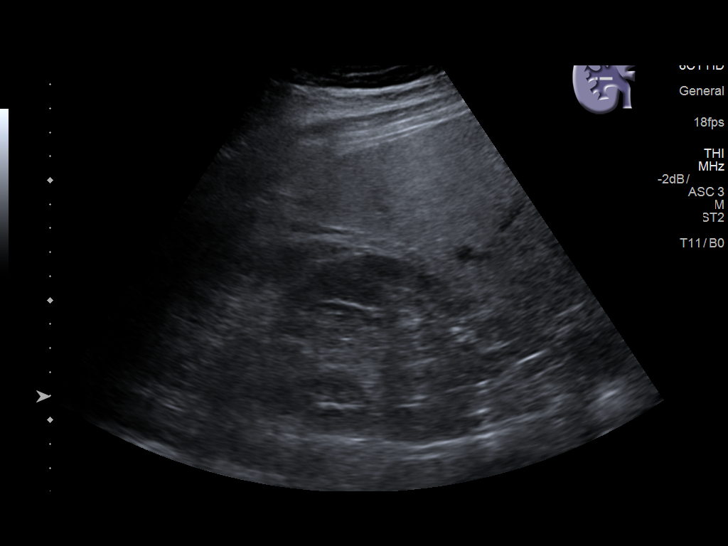
[im 55/66]
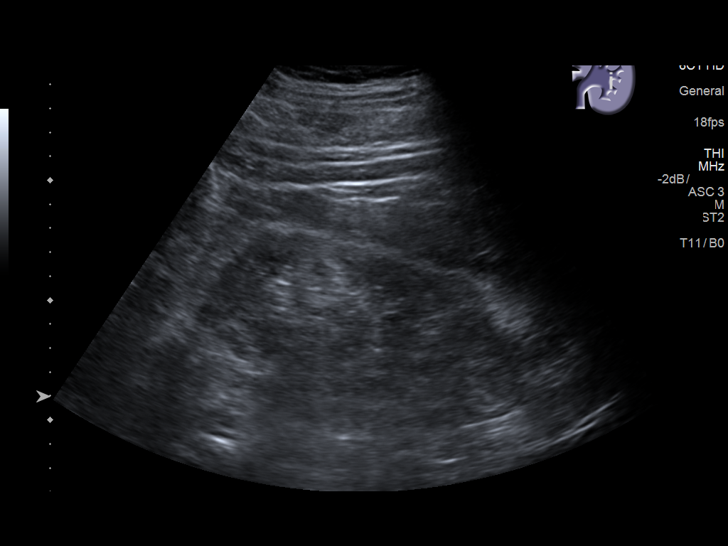
[im 60/66]
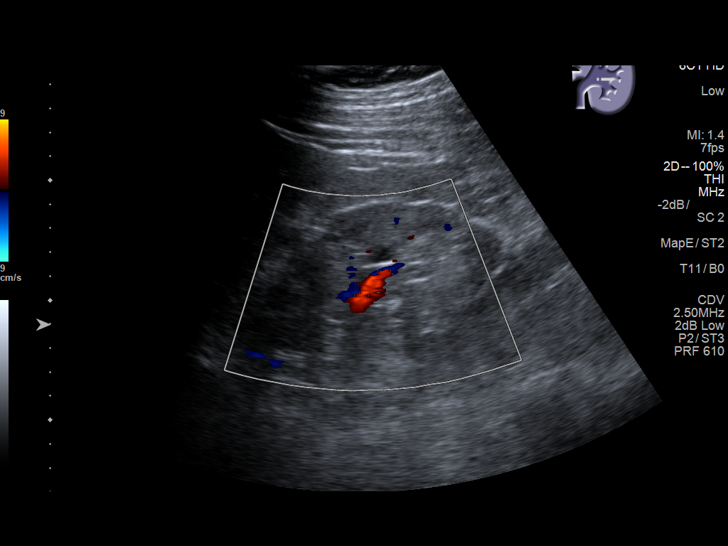
[im 66/66]
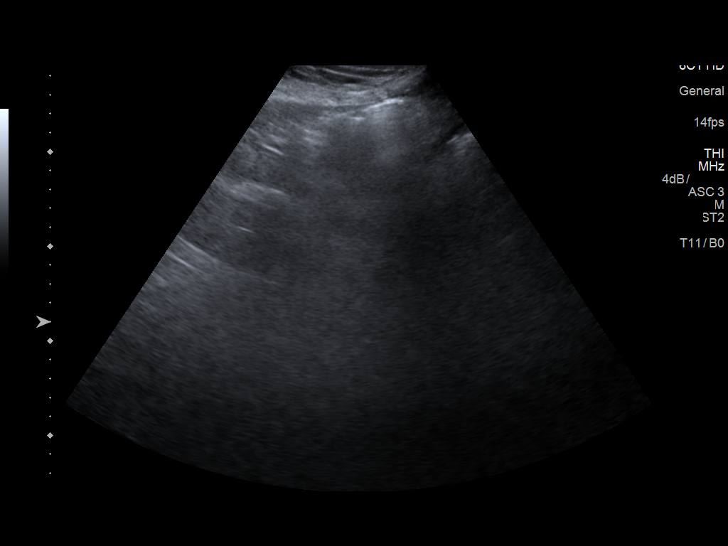

[13 of 25 positions shown; findings below may reference images not displayed]

FINDINGS: Gallbladder: Surgically absent.

Common bile duct: Diameter: 4 mm, normal.

Liver: No focal lesion identified. Diffusely increased and
heterogeneous in parenchymal echogenicity. Elongated left lobe
extending across the midline, as seen on prior CT. Normal
directional flow in the imaged portal vein.

IVC: No abnormality visualized.

Pancreas: Not well evaluated sonographically.

Spleen: No focal abnormality. Upper limits of normal in size
spanning 12.9 cm.

Right Kidney: Length: 14.0 cm. Echogenicity within normal limits.
There is thinning of renal parenchyma. No mass or hydronephrosis
visualized. Cysts on prior CT are not well visualized
sonographically.

Left Kidney: Length: 13.9 cm. Echogenicity within normal limits.
There is thinning of renal parenchyma. No mass or hydronephrosis
visualized. No shadowing calculi.

Abdominal aorta: No aneurysm visualized. Mid and distal aorta are
not visualized.

Other findings: None.  No ascites.
IMPRESSION: 1. Hepatic steatosis and probable hepatomegaly.  No focal lesion.
2. Postcholecystectomy without biliary dilatation.
3. Thinning of bilateral renal parenchyma, no hydronephrosis.
4. Midline structures including pancreas and abdominal aorta are not
well visualized sonographically due to habitus and bowel gas.

## 2017-12-28 ENCOUNTER — Ambulatory Visit (INDEPENDENT_AMBULATORY_CARE_PROVIDER_SITE_OTHER): Payer: Medicare Other | Admitting: Orthopedic Surgery

## 2017-12-28 ENCOUNTER — Encounter (INDEPENDENT_AMBULATORY_CARE_PROVIDER_SITE_OTHER): Payer: Self-pay | Admitting: Orthopedic Surgery

## 2017-12-28 DIAGNOSIS — Z89412 Acquired absence of left great toe: Secondary | ICD-10-CM | POA: Diagnosis not present

## 2017-12-28 DIAGNOSIS — M6702 Short Achilles tendon (acquired), left ankle: Secondary | ICD-10-CM | POA: Diagnosis not present

## 2017-12-28 DIAGNOSIS — IMO0002 Reserved for concepts with insufficient information to code with codable children: Secondary | ICD-10-CM

## 2017-12-28 DIAGNOSIS — L97521 Non-pressure chronic ulcer of other part of left foot limited to breakdown of skin: Secondary | ICD-10-CM

## 2017-12-28 NOTE — Progress Notes (Signed)
Office Visit Note   Patient: Oscar [name].           Date of Birth: 11/25/1945           MRN: [id number] Visit Date: 12/28/2017              Requested by: Josetta Huddle, MD [address] [place] [address] [place], [place] [address] PCP: Josetta Huddle, MD  Chief Complaint  Patient presents with  . Left Foot - Follow-up, Routine Post Op      HPI: Patient is a 72 year old gentleman who presents with a new ulcer left foot second toe.  Patient states he has been having discoloration pain and swelling.  He is status post a first ray amputation.  Patient also states he feels like he is having some a flareup of gout.  He does have an appointment this week with his primary care physician.  Assessment & Plan: Visit Diagnoses:  1. Great toe amputation status, left ([place])   2. Ulcer of left second toe, limited to breakdown of skin ([place])     Plan: Recommended that he have his uric acid checked with his routine labs this week.  Recommended a stiff soled sneaker to unload the pressure from the second toe.  Recommended holding on his nail trimming until this ulcer has resolved.  Recommended Achilles stretching in 2 ways were demonstrated to the patient and his family to stretch his Achilles.  Recommend he decrease his activities until this ulcer heals.  Follow-Up Instructions: Return in about 2 weeks (around 01/11/2018).   Ortho Exam  Patient is alert, oriented, no adenopathy, well-dressed, normal affect, normal respiratory effort. Examination patient is developing significant Achilles contracture in the left he has dorsiflexion about 30 degrees short of neutral with his knee extended.  His foot has good warmth there are no ischemic changes his previous surgical incision is well-healed.  Patient is developed a blood blister ulcer over the tip of the second toe secondary to his Achilles contracture and neuropathy.  There is no redness no cellulitis no sausage digit swelling no signs of  infection.  Imaging: No results found. No images are attached to the encounter.  Labs: Lab Results  Component Value Date   HGBA1C 10.1 (H) 09/08/2016   HGBA1C 9.1 (H) 08/28/2014   HGBA1C 10.1 (H) 08/07/2013   ESRSEDRATE 75 (H) 08/28/2014   ESRSEDRATE 42 (H) 11/17/2012   CRP 12.9 (H) 08/28/2014   LABURIC 3.0 (L) 08/31/2014   REPTSTATUS 10/03/2017 FINAL 09/30/2017   GRAMSTAIN  09/30/2017    RARE WBC PRESENT, PREDOMINANTLY MONONUCLEAR NO ORGANISMS SEEN    CULT  09/30/2017    NO GROWTH 2 DAYS Performed at [facility], [place] [address] [place], [place] [address]      Lab Results  Component Value Date   ALBUMIN 3.3 (L) 09/07/2016   ALBUMIN 3.6 09/15/2014   ALBUMIN 3.1 (L) 08/29/2014   LABURIC 3.0 (L) 08/31/2014    There is no height or weight on file to calculate BMI.  Orders:  No orders of the defined types were placed in this encounter.  No orders of the defined types were placed in this encounter.    Procedures: No procedures performed  Clinical Data: No additional findings.  ROS:  All other systems negative, except as noted in the HPI. Review of Systems  Objective: Vital Signs: There were no vitals taken for this visit.  Specialty Comments:  No specialty comments available.  PMFS History: Patient  Active Problem List   Diagnosis Date Noted  . Dehiscence of amputation stump ([place]) 11/10/2017  . Great toe amputation status, left ([place]) 11/10/2017  . Chronic osteomyelitis of toe, left ([place])   . Memory change 04/14/2017  . Ulcer of left second toe, limited to breakdown of skin ([place]) 04/09/2017  . Cutaneous abscess of left foot 04/09/2017  . Non-pressure chronic ulcer of right heel and midfoot limited to breakdown of skin ([place]) 10/07/2016  . Influenza A   . AKI (acute kidney injury) ([place]) 09/07/2016  . Acute encephalopathy 09/07/2016  . Acute respiratory failure with hypoxia ([place]) 09/07/2016  . Elevated lactic acid level 09/07/2016  . Elevated  troponin 09/07/2016  . Coronary artery calcification seen on CT scan 10/28/2015  . Acute on chronic diastolic (congestive) heart failure ([place]) 09/26/2015  . OSA (obstructive sleep apnea) 02/07/2015  . Sepsis ([place]) 08/28/2014  . Chronic diastolic heart failure ([place]) 09/21/2013  . CVA (cerebral infarction) 08/08/2013  . Dizziness 08/07/2013  . TIA (transient ischemic attack) 08/07/2013  . Atrial fibrillation ([place]) 08/07/2013  . Hypertension 08/07/2013  . Anemia 08/07/2013  . Dyspnea 06/21/2013  . Chest pain 11/16/2012  . Atrial fibrillation with RVR ([place]) 11/16/2012  . Gout 11/16/2012  . Diabetes mellitus type 2, uncontrolled, with complications ([place]) 96/11/5407  . CKD (chronic kidney disease), stage II 11/16/2012  . Atypical atrial flutter ([place]) 11/16/2012  . Pleuritic chest pain 11/16/2012  . Morbid obesity ([place]) 11/16/2012  . Obstructive sleep apnea 11/16/2012   Past Medical History:  Diagnosis Date  . Anal fissure    h/o - no recent complications  . Arthritis    hips  . Atrial fibrillation ([place])   . Depression   . Diabetes mellitus without complication ([place])    improved after diet/exercise  . Enlarged prostate   . GERD (gastroesophageal reflux disease)   . Gout   . H/O hiatal hernia   . Hx of typhoid fever    as a child  . Hypertension    improved after diet/exercise  . Kidney stones   . Memory change 04/14/2017  . OSA (obstructive sleep apnea)    wears cpap  . Pneumonia   . PONV (postoperative nausea and vomiting)    also difficulty waking up  . Stroke [facility]) 08/2013   short term memory loss (slight)    Family History  Problem Relation Age of Onset  . COPD Father   . Heart attack Sister        died age 62 of MI    Past Surgical History:  Procedure Laterality Date  . AMPUTATION Left 10/02/2017   Procedure: LEFT FOOT 1ST RAY AMPUTATION;  Surgeon: Newt Minion, MD;  Location: [facility];  Service: Orthopedics;  Laterality: Left;  . carpel tunnel Bilateral   .  CATARACT EXTRACTION W/ INTRAOCULAR LENS  IMPLANT, BILATERAL    . CHOLECYSTECTOMY    . COLONOSCOPY    . EYE SURGERY     laser surgery   . I&D EXTREMITY Left 09/15/2014   Procedure: Excision Necrotic Left Achilles, Skin Graft, apply Wound VAC;  Surgeon: Newt Minion, MD;  Location: [facility];  Service: Orthopedics;  Laterality: Left;  . KIDNEY STONE SURGERY    . KNEE ARTHROSCOPY Bilateral   . LIGAMENT REPAIR    . TONSILLECTOMY    . VASECTOMY     Social History   Occupational History  . Occupation: Retired  Tobacco Use  . Smoking status: Never Smoker  . Smokeless tobacco:  Never Used  Substance and Sexual Activity  . Alcohol use: Yes    Alcohol/week: 0.0 oz    Comment: rare - 1-2x /yr  . Drug use: No  . Sexual activity: Not on file

## 2017-12-29 DIAGNOSIS — E782 Mixed hyperlipidemia: Secondary | ICD-10-CM | POA: Diagnosis not present

## 2017-12-29 DIAGNOSIS — E0840 Diabetes mellitus due to underlying condition with diabetic neuropathy, unspecified: Secondary | ICD-10-CM | POA: Diagnosis not present

## 2017-12-29 DIAGNOSIS — M109 Gout, unspecified: Secondary | ICD-10-CM | POA: Diagnosis not present

## 2017-12-29 DIAGNOSIS — Z9111 Patient's noncompliance with dietary regimen: Secondary | ICD-10-CM | POA: Diagnosis not present

## 2017-12-29 DIAGNOSIS — E113593 Type 2 diabetes mellitus with proliferative diabetic retinopathy without macular edema, bilateral: Secondary | ICD-10-CM | POA: Diagnosis not present

## 2017-12-29 DIAGNOSIS — I509 Heart failure, unspecified: Secondary | ICD-10-CM | POA: Diagnosis not present

## 2017-12-29 DIAGNOSIS — G4733 Obstructive sleep apnea (adult) (pediatric): Secondary | ICD-10-CM | POA: Diagnosis not present

## 2017-12-29 DIAGNOSIS — I6789 Other cerebrovascular disease: Secondary | ICD-10-CM | POA: Diagnosis not present

## 2017-12-29 DIAGNOSIS — I4891 Unspecified atrial fibrillation: Secondary | ICD-10-CM | POA: Diagnosis not present

## 2017-12-31 ENCOUNTER — Ambulatory Visit (INDEPENDENT_AMBULATORY_CARE_PROVIDER_SITE_OTHER): Payer: Medicare Other | Admitting: Orthopedic Surgery

## 2018-01-08 ENCOUNTER — Ambulatory Visit: Payer: Medicare Other | Admitting: Podiatry

## 2018-01-11 ENCOUNTER — Encounter (INDEPENDENT_AMBULATORY_CARE_PROVIDER_SITE_OTHER): Payer: Self-pay | Admitting: Orthopedic Surgery

## 2018-01-11 ENCOUNTER — Ambulatory Visit (INDEPENDENT_AMBULATORY_CARE_PROVIDER_SITE_OTHER): Payer: Medicare Other | Admitting: Orthopedic Surgery

## 2018-01-11 DIAGNOSIS — M869 Osteomyelitis, unspecified: Secondary | ICD-10-CM | POA: Diagnosis not present

## 2018-01-11 NOTE — Progress Notes (Signed)
Office Visit Note   Patient: Oscar [name].           Date of Birth: 03/10/1946           MRN: [id number] Visit Date: 01/11/2018              Requested by: Josetta Huddle, MD [address] [place] [address] [place], [place] [address] PCP: Josetta Huddle, MD  Chief Complaint  Patient presents with  . Left Leg - Follow-up, Routine Post Op      HPI: Patient is a 72 year old gentleman status post left foot first ray amputation approximately 3 months ago who presents with ulceration cellulitis and swelling of the second toe.  Patient states that he is fallen twice recently due to being knocked over by children.  Assessment & Plan: Visit Diagnoses:  1. Osteomyelitis of toe of left foot (HCC)     Plan: Discussed that with the osteomyelitis of the second toe the best option is a second toe amputation left foot.  Discussed that after this heals we will set him up with outpatient physical therapy for strengthening and balance.  Recommend using a cane at this time to prevent recurrent falls.  Follow-Up Instructions: Return in about 2 weeks (around 01/25/2018).   Ortho Exam  Patient is alert, oriented, no adenopathy, well-dressed, normal affect, normal respiratory effort. Examination patient has a palpable pulse he has cellulitis and sausage digit swelling of the left foot second toe.  The plantar ulcer probes to bone.  Imaging: No results found. No images are attached to the encounter.  Labs: Lab Results  Component Value Date   HGBA1C 10.1 (H) 09/08/2016   HGBA1C 9.1 (H) 08/28/2014   HGBA1C 10.1 (H) 08/07/2013   ESRSEDRATE 75 (H) 08/28/2014   ESRSEDRATE 42 (H) 11/17/2012   CRP 12.9 (H) 08/28/2014   LABURIC 3.0 (L) 08/31/2014   REPTSTATUS 10/03/2017 FINAL 09/30/2017   GRAMSTAIN  09/30/2017    RARE WBC PRESENT, PREDOMINANTLY MONONUCLEAR NO ORGANISMS SEEN    CULT  09/30/2017    NO GROWTH 2 DAYS Performed at [facility], [place] [address], [place], [place] [address]       Lab Results  Component Value Date   ALBUMIN 3.3 (L) 09/07/2016   ALBUMIN 3.6 09/15/2014   ALBUMIN 3.1 (L) 08/29/2014   LABURIC 3.0 (L) 08/31/2014    There is no height or weight on file to calculate BMI.  Orders:  No orders of the defined types were placed in this encounter.  No orders of the defined types were placed in this encounter.    Procedures: No procedures performed  Clinical Data: No additional findings.  ROS:  All other systems negative, except as noted in the HPI. Review of Systems  Objective: Vital Signs: There were no vitals taken for this visit.  Specialty Comments:  No specialty comments available.  PMFS History: Patient Active Problem List   Diagnosis Date Noted  . Achilles tendon contracture, left 12/28/2017  . Dehiscence of amputation stump ([place]) 11/10/2017  . Great toe amputation status, left ([place]) 11/10/2017  . Chronic osteomyelitis of toe, left ([place])   . Memory change 04/14/2017  . Ulcer of left second toe, limited to breakdown of skin ([place]) 04/09/2017  . Cutaneous abscess of left foot 04/09/2017  . Non-pressure chronic ulcer of right heel and midfoot limited to breakdown of skin ([place]) 10/07/2016  . Influenza A   . AKI (acute kidney injury) ([place]) 09/07/2016  . Acute encephalopathy 09/07/2016  .  Acute respiratory failure with hypoxia ([place]) 09/07/2016  . Elevated lactic acid level 09/07/2016  . Elevated troponin 09/07/2016  . Coronary artery calcification seen on CT scan 10/28/2015  . Acute on chronic diastolic (congestive) heart failure ([place]) 09/26/2015  . OSA (obstructive sleep apnea) 02/07/2015  . Sepsis ([place]) 08/28/2014  . Chronic diastolic heart failure ([place]) 09/21/2013  . CVA (cerebral infarction) 08/08/2013  . Dizziness 08/07/2013  . TIA (transient ischemic attack) 08/07/2013  . Atrial fibrillation ([place]) 08/07/2013  . Hypertension 08/07/2013  . Anemia 08/07/2013  . Dyspnea 06/21/2013  . Chest pain 11/16/2012  . Atrial  fibrillation with RVR ([place]) 11/16/2012  . Gout 11/16/2012  . Diabetes mellitus type 2, uncontrolled, with complications ([place]) 83/15/1761  . CKD (chronic kidney disease), stage II 11/16/2012  . Atypical atrial flutter ([place]) 11/16/2012  . Pleuritic chest pain 11/16/2012  . Morbid obesity ([place]) 11/16/2012  . Obstructive sleep apnea 11/16/2012   Past Medical History:  Diagnosis Date  . Anal fissure    h/o - no recent complications  . Arthritis    hips  . Atrial fibrillation ([place])   . Depression   . Diabetes mellitus without complication ([place])    improved after diet/exercise  . Enlarged prostate   . GERD (gastroesophageal reflux disease)   . Gout   . H/O hiatal hernia   . Hx of typhoid fever    as a child  . Hypertension    improved after diet/exercise  . Kidney stones   . Memory change 04/14/2017  . OSA (obstructive sleep apnea)    wears cpap  . Pneumonia   . PONV (postoperative nausea and vomiting)    also difficulty waking up  . Stroke [facility]) 08/2013   short term memory loss (slight)    Family History  Problem Relation Age of Onset  . COPD Father   . Heart attack Sister        died age 70 of MI    Past Surgical History:  Procedure Laterality Date  . AMPUTATION Left 10/02/2017   Procedure: LEFT FOOT 1ST RAY AMPUTATION;  Surgeon: Newt Minion, MD;  Location: [facility];  Service: Orthopedics;  Laterality: Left;  . carpel tunnel Bilateral   . CATARACT EXTRACTION W/ INTRAOCULAR LENS  IMPLANT, BILATERAL    . CHOLECYSTECTOMY    . COLONOSCOPY    . EYE SURGERY     laser surgery   . I&D EXTREMITY Left 09/15/2014   Procedure: Excision Necrotic Left Achilles, Skin Graft, apply Wound VAC;  Surgeon: Newt Minion, MD;  Location: [facility];  Service: Orthopedics;  Laterality: Left;  . KIDNEY STONE SURGERY    . KNEE ARTHROSCOPY Bilateral   . LIGAMENT REPAIR    . TONSILLECTOMY    . VASECTOMY     Social History   Occupational History  . Occupation: Retired  Tobacco Use  . Smoking  status: Never Smoker  . Smokeless tobacco: Never Used  Substance and Sexual Activity  . Alcohol use: Yes    Alcohol/week: 0.0 oz    Comment: rare - 1-2x /yr  . Drug use: No  . Sexual activity: Not on file

## 2018-01-12 ENCOUNTER — Ambulatory Visit (INDEPENDENT_AMBULATORY_CARE_PROVIDER_SITE_OTHER): Payer: Medicare Other | Admitting: Orthopedic Surgery

## 2018-01-13 ENCOUNTER — Encounter (HOSPITAL_COMMUNITY): Payer: Self-pay | Admitting: *Deleted

## 2018-01-13 ENCOUNTER — Other Ambulatory Visit: Payer: Self-pay

## 2018-01-13 NOTE — Progress Notes (Signed)
Spoke with pt for pre-op call. Pt has hx of A-fib and is on Eliquis. He will continue per Dr. Sharol Given. Pt denies any recent chest pain or sob. Pt is a type 2 diabetic. Pt states his last A1C was 8.5 in the last 2 weeks. Pt states his fasting blood sugar is usually between 150-200. Pt takes Novolog insulin and Levemir. The instructions we had in his medication list just said pt takes Novolog 50 units 3 times a day if needed for high blood sugar. Pt was having a hard time understanding what I was asking him, his daughter in law got on the phone who is a Therapist, sports and Diabetic Coordinator at her hospital. She was able to tell me that he does take Novolog every night 50 units and he has a sliding scale dose to use Novolog during the day with meals. I asked her to please make sure pt brings the sliding scale insulin dose instructions with him DOS. She states pt would have the instructions with him. I instructed her to have pt not take his Novolog insulin Thursday PM and to take 1/2 of his regular dose of Levemir Insulin Thursday PM (will take 25 units). Instructed her to have pt check his blood sugar the morning of surgery when he gets up and every 2 hours until he leaves for the hospital. If blood sugar is >220 take 1/2 of usual correction dose of Novolog insulin. If blood sugar is 70 or below, treat with 1/2 cup of clear juice (apple or cranberry) and recheck blood sugar 15 minutes after drinking juice. If blood sugar continues to be 70 or below, call the Short Stay department and ask to speak to a nurse. She voiced understanding.

## 2018-01-14 ENCOUNTER — Ambulatory Visit (INDEPENDENT_AMBULATORY_CARE_PROVIDER_SITE_OTHER): Payer: Medicare Other | Admitting: Orthopedic Surgery

## 2018-01-14 ENCOUNTER — Other Ambulatory Visit (INDEPENDENT_AMBULATORY_CARE_PROVIDER_SITE_OTHER): Payer: Self-pay | Admitting: Orthopedic Surgery

## 2018-01-14 DIAGNOSIS — M86272 Subacute osteomyelitis, left ankle and foot: Secondary | ICD-10-CM

## 2018-01-14 NOTE — Progress Notes (Addendum)
Anesthesia Chart Review:  Pt is a same day work up    Case:  [id number] Date/Time:  01/15/18 1205   Procedure:  LEFT FOOT 2ND TOE AMPUTATION (Left )   Anesthesia type:  Choice   Pre-op diagnosis:  Osteomyelitis Left 2nd Toe   Location:  [facility] OR ROOM 05 / [facility]   Surgeon:  Newt Minion, MD      DISCUSSION:  - Pt is a 72 year old male with hx chronic diastolic HF, afib, HTN, DM, OSA   PROVIDERS: PCP is Josetta Huddle, MD   Patient Care Team: Delrae Rend, MD as Consulting Physician (Endocrinology) EP cardiologist is Cristopher Peru, MD.  Last office visit 09/05/15 HF cardiologist is Glori Bickers, MD. Last office visit 12/14/17   LABS: Will be obtained day of surgery   EKG 12/14/17: Atrial fibrillation with premature ventricular or aberrantly conducted complexes. Septal infarct, age undetermined   CV:  Cardiopulmonary exercise test 09/20/15:  - Exercise testing with gas exchange demonstrates a severe functional impairment when compared to matched sedentary norms. The limitation appears to be primarily due to the patients severe obesity and related restrictive lung physiology. However, the blunted BP response to exercise and low O2 pulse suggest a concomitant circulatory limitation which is likely related to his diastolic HF and atrial fibrillation.   Echo 06/12/15:  - Left ventricle: The cavity size was normal. Systolic function was normal. The estimated ejection fraction was in the range of 55% to 60%. Wall motion was normal; there were no regional wall motion abnormalities. - Aortic valve: There was trivial regurgitation. - Mitral valve: Calcified annulus. There was mild regurgitation. - Left atrium: The atrium was moderately dilated. - Right atrium: The atrium was mildly dilated. - Atrial septum: No defect or patent foramen ovale was identified. - Pulmonary arteries: PA peak pressure: 37 mm Hg (S).  Carotid duplex 08/07/13:  - The vertebral arteries appear patent with antegrade  flow. - Findings consistent with 1-39 percent stenosis involvingthe B ICA.    Past Medical History:  Diagnosis Date  . Anal fissure    h/o - no recent complications  . Arthritis    hips  . Atrial fibrillation ([place])   . Depression   . Diabetes mellitus without complication ([place])    improved after diet/exercise  . Enlarged prostate   . GERD (gastroesophageal reflux disease)   . Gout   . H/O hiatal hernia   . History of kidney stones   . Hx of typhoid fever    as a child  . Hypertension    improved after diet/exercise  . Memory change 04/14/2017  . OSA (obstructive sleep apnea)    wears cpap  . Pneumonia   . PONV (postoperative nausea and vomiting)    also difficulty waking up  . Stroke [facility]) 08/2013   short term memory loss (slight)    Past Surgical History:  Procedure Laterality Date  . AMPUTATION Left 10/02/2017   Procedure: LEFT FOOT 1ST RAY AMPUTATION;  Surgeon: Newt Minion, MD;  Location: [facility];  Service: Orthopedics;  Laterality: Left;  . carpel tunnel Bilateral   . CATARACT EXTRACTION W/ INTRAOCULAR LENS  IMPLANT, BILATERAL    . CHOLECYSTECTOMY    . COLONOSCOPY    . EYE SURGERY     laser surgery   . I&D EXTREMITY Left 09/15/2014   Procedure: Excision Necrotic Left Achilles, Skin Graft, apply Wound VAC;  Surgeon: Newt Minion, MD;  Location: [facility];  Service:  Orthopedics;  Laterality: Left;  . KIDNEY STONE SURGERY    . KNEE ARTHROSCOPY Bilateral   . LIGAMENT REPAIR    . TONSILLECTOMY    . VASECTOMY      MEDICATIONS: No current facility-administered medications for this encounter.    Marland Kitchen acetaminophen (TYLENOL) 500 MG tablet  . allopurinol (ZYLOPRIM) 300 MG tablet  . apixaban (ELIQUIS) 5 MG TABS tablet  . buPROPion (WELLBUTRIN SR) 150 MG 12 hr tablet  . diltiazem (CARDIZEM CD) 360 MG 24 hr capsule  . escitalopram (LEXAPRO) 20 MG tablet  . fexofenadine (ALLEGRA) 180 MG tablet  . furosemide (LASIX) 80 MG tablet  . indomethacin (INDOCIN) 50 MG capsule  .  insulin aspart (NOVOLOG) 100 UNIT/ML injection  . LEVEMIR 100 UNIT/ML injection  . metFORMIN (GLUCOPHAGE-XR) 500 MG 24 hr tablet  . metolazone (ZAROXOLYN) 2.5 MG tablet  . potassium chloride SA (K-DUR,KLOR-CON) 20 MEQ tablet  . simvastatin (ZOCOR) 20 MG tablet  . tamsulosin (FLOMAX) 0.4 MG CAPS capsule  . B-D INS SYRINGE 0.5CC/30GX1/2" 30G X 1/2" 0.5 ML MISC  . blood glucose meter kit and supplies KIT  . doxycycline (VIBRA-TABS) 100 MG tablet  . mupirocin ointment (BACTROBAN) 2 %  . oxyCODONE-acetaminophen (PERCOCET/ROXICET) 5-325 MG tablet  . pantoprazole (PROTONIX) 40 MG tablet  . pentoxifylline (TRENTAL) 400 MG CR tablet    If labs acceptable day of surgery, I anticipate pt can proceed with surgery as scheduled.  Willeen Cass, FNP-BC [facility]/Anesthesiology Phone: [phone] 01/14/2018 10:18 AM

## 2018-01-15 ENCOUNTER — Ambulatory Visit (HOSPITAL_COMMUNITY): Payer: Medicare Other | Admitting: Emergency Medicine

## 2018-01-15 ENCOUNTER — Other Ambulatory Visit: Payer: Self-pay

## 2018-01-15 ENCOUNTER — Encounter (HOSPITAL_COMMUNITY): Payer: Self-pay

## 2018-01-15 ENCOUNTER — Encounter (HOSPITAL_COMMUNITY)
Admission: RE | Disposition: A | Discharge status: Home / Self Care | Payer: Self-pay | Source: Ambulatory Visit | Attending: Orthopedic Surgery

## 2018-01-15 ENCOUNTER — Ambulatory Visit (HOSPITAL_COMMUNITY)
Admission: RE | Admit: 2018-01-15 | Discharge: 2018-01-15 | Disposition: A | Discharge status: Home / Self Care | Payer: Medicare Other | Source: Ambulatory Visit | Attending: Orthopedic Surgery | Admitting: Orthopedic Surgery

## 2018-01-15 DIAGNOSIS — G4730 Sleep apnea, unspecified: Secondary | ICD-10-CM | POA: Insufficient documentation

## 2018-01-15 DIAGNOSIS — Z825 Family history of asthma and other chronic lower respiratory diseases: Secondary | ICD-10-CM | POA: Diagnosis not present

## 2018-01-15 DIAGNOSIS — M869 Osteomyelitis, unspecified: Secondary | ICD-10-CM | POA: Insufficient documentation

## 2018-01-15 DIAGNOSIS — Z794 Long term (current) use of insulin: Secondary | ICD-10-CM | POA: Insufficient documentation

## 2018-01-15 DIAGNOSIS — N400 Enlarged prostate without lower urinary tract symptoms: Secondary | ICD-10-CM | POA: Diagnosis not present

## 2018-01-15 DIAGNOSIS — Z91041 Radiographic dye allergy status: Secondary | ICD-10-CM | POA: Diagnosis not present

## 2018-01-15 DIAGNOSIS — Z9841 Cataract extraction status, right eye: Secondary | ICD-10-CM | POA: Diagnosis not present

## 2018-01-15 DIAGNOSIS — M109 Gout, unspecified: Secondary | ICD-10-CM | POA: Insufficient documentation

## 2018-01-15 DIAGNOSIS — M86272 Subacute osteomyelitis, left ankle and foot: Secondary | ICD-10-CM

## 2018-01-15 DIAGNOSIS — K219 Gastro-esophageal reflux disease without esophagitis: Secondary | ICD-10-CM | POA: Diagnosis not present

## 2018-01-15 DIAGNOSIS — I080 Rheumatic disorders of both mitral and aortic valves: Secondary | ICD-10-CM | POA: Insufficient documentation

## 2018-01-15 DIAGNOSIS — M160 Bilateral primary osteoarthritis of hip: Secondary | ICD-10-CM | POA: Insufficient documentation

## 2018-01-15 DIAGNOSIS — K449 Diaphragmatic hernia without obstruction or gangrene: Secondary | ICD-10-CM | POA: Insufficient documentation

## 2018-01-15 DIAGNOSIS — I69311 Memory deficit following cerebral infarction: Secondary | ICD-10-CM | POA: Diagnosis not present

## 2018-01-15 DIAGNOSIS — G4733 Obstructive sleep apnea (adult) (pediatric): Secondary | ICD-10-CM | POA: Diagnosis not present

## 2018-01-15 DIAGNOSIS — Z8249 Family history of ischemic heart disease and other diseases of the circulatory system: Secondary | ICD-10-CM | POA: Diagnosis not present

## 2018-01-15 DIAGNOSIS — Z9049 Acquired absence of other specified parts of digestive tract: Secondary | ICD-10-CM | POA: Diagnosis not present

## 2018-01-15 DIAGNOSIS — Z8619 Personal history of other infectious and parasitic diseases: Secondary | ICD-10-CM | POA: Insufficient documentation

## 2018-01-15 DIAGNOSIS — I110 Hypertensive heart disease with heart failure: Secondary | ICD-10-CM | POA: Diagnosis not present

## 2018-01-15 DIAGNOSIS — E1169 Type 2 diabetes mellitus with other specified complication: Secondary | ICD-10-CM | POA: Diagnosis not present

## 2018-01-15 DIAGNOSIS — Z87442 Personal history of urinary calculi: Secondary | ICD-10-CM | POA: Insufficient documentation

## 2018-01-15 DIAGNOSIS — Z89412 Acquired absence of left great toe: Secondary | ICD-10-CM | POA: Insufficient documentation

## 2018-01-15 DIAGNOSIS — I509 Heart failure, unspecified: Secondary | ICD-10-CM | POA: Insufficient documentation

## 2018-01-15 DIAGNOSIS — F329 Major depressive disorder, single episode, unspecified: Secondary | ICD-10-CM | POA: Insufficient documentation

## 2018-01-15 DIAGNOSIS — E119 Type 2 diabetes mellitus without complications: Secondary | ICD-10-CM | POA: Diagnosis not present

## 2018-01-15 DIAGNOSIS — Z79899 Other long term (current) drug therapy: Secondary | ICD-10-CM | POA: Diagnosis not present

## 2018-01-15 DIAGNOSIS — I4891 Unspecified atrial fibrillation: Secondary | ICD-10-CM | POA: Diagnosis not present

## 2018-01-15 DIAGNOSIS — Z9842 Cataract extraction status, left eye: Secondary | ICD-10-CM | POA: Insufficient documentation

## 2018-01-15 DIAGNOSIS — I5033 Acute on chronic diastolic (congestive) heart failure: Secondary | ICD-10-CM | POA: Diagnosis not present

## 2018-01-15 DIAGNOSIS — Z888 Allergy status to other drugs, medicaments and biological substances status: Secondary | ICD-10-CM | POA: Diagnosis not present

## 2018-01-15 HISTORY — PX: AMPUTATION: SHX166

## 2018-01-15 HISTORY — DX: Personal history of urinary calculi: Z87.442

## 2018-01-15 LAB — BASIC METABOLIC PANEL
ANION GAP: 13 (ref 5–15)
BUN: 41 mg/dL — ABNORMAL HIGH (ref 6–20)
CALCIUM: 9.4 mg/dL (ref 8.9–10.3)
CO2: 23 mmol/L (ref 22–32)
CREATININE: 1.67 mg/dL — AB (ref 0.61–1.24)
Chloride: 104 mmol/L (ref 101–111)
GFR calc Af Amer: 46 mL/min — ABNORMAL LOW (ref >60)
GFR calc non Af Amer: 39 mL/min — ABNORMAL LOW (ref >60)
GLUCOSE: 218 mg/dL — AB (ref 65–99)
Potassium: 3.8 mmol/L (ref 3.5–5.1)
Sodium: 140 mmol/L (ref 135–145)

## 2018-01-15 LAB — CBC
HCT: 38 % — ABNORMAL LOW (ref 39.0–52.0)
HEMOGLOBIN: 12 g/dL — AB (ref 13.0–17.0)
MCH: 28.4 pg (ref 26.0–34.0)
MCHC: 31.6 g/dL (ref 30.0–36.0)
MCV: 90 fL (ref 78.0–100.0)
Platelets: 209 10*3/uL (ref 150–400)
RBC: 4.22 MIL/uL (ref 4.22–5.81)
RDW: 15.9 % — ABNORMAL HIGH (ref 11.5–15.5)
WBC: 9.3 10*3/uL (ref 4.0–10.5)

## 2018-01-15 LAB — PROTIME-INR
INR: 1.57
PROTHROMBIN TIME: 18.6 s — AB (ref 11.4–15.2)

## 2018-01-15 LAB — GLUCOSE, CAPILLARY
Glucose-Capillary: 236 mg/dL — ABNORMAL HIGH (ref 65–99)
Glucose-Capillary: 134 mg/dL — ABNORMAL HIGH (ref 65–99)

## 2018-01-15 SURGERY — AMPUTATION DIGIT
Anesthesia: Monitor Anesthesia Care | Laterality: Left

## 2018-01-15 MED ORDER — FENTANYL CITRATE (PF) 100 MCG/2ML IJ SOLN
50.0000 ug | Freq: Once | INTRAMUSCULAR | Status: AC
  Administered 2018-01-15: 50 ug via INTRAVENOUS

## 2018-01-15 MED ORDER — FENTANYL CITRATE (PF) 250 MCG/5ML IJ SOLN
INTRAMUSCULAR | Status: AC
  Filled 2018-01-15: qty 5

## 2018-01-15 MED ORDER — HYDROMORPHONE HCL 2 MG/ML IJ SOLN: 0.2500 mg | INTRAMUSCULAR | Status: DC | PRN

## 2018-01-15 MED ORDER — HYDROCODONE-ACETAMINOPHEN 7.5-325 MG PO TABS: 1.0000 | ORAL_TABLET | Freq: Once | ORAL | Status: DC | PRN

## 2018-01-15 MED ORDER — PROPOFOL 10 MG/ML IV BOLUS
INTRAVENOUS | Status: AC
  Filled 2018-01-15: qty 20

## 2018-01-15 MED ORDER — OXYCODONE-ACETAMINOPHEN 5-325 MG PO TABS: 1.0000 | ORAL_TABLET | ORAL | 0 refills | Status: DC | PRN

## 2018-01-15 MED ORDER — CEFAZOLIN SODIUM-DEXTROSE 2-4 GM/100ML-% IV SOLN
2.0000 g | INTRAVENOUS | Status: AC
  Administered 2018-01-15: 3 g via INTRAVENOUS
  Filled 2018-01-15: qty 100

## 2018-01-15 MED ORDER — FENTANYL CITRATE (PF) 100 MCG/2ML IJ SOLN
INTRAMUSCULAR | Status: AC
  Administered 2018-01-15: 50 ug via INTRAVENOUS
  Filled 2018-01-15: qty 2

## 2018-01-15 MED ORDER — MIDAZOLAM HCL 2 MG/2ML IJ SOLN
1.0000 mg | Freq: Once | INTRAMUSCULAR | Status: AC
Start: 2018-01-15 — End: 2018-01-15
  Administered 2018-01-15: 1 mg via INTRAVENOUS

## 2018-01-15 MED ORDER — 0.9 % SODIUM CHLORIDE (POUR BTL) OPTIME
TOPICAL | Status: DC | PRN
  Administered 2018-01-15: 1000 mL

## 2018-01-15 MED ORDER — LACTATED RINGERS IV SOLN
INTRAVENOUS | Status: DC | PRN
  Administered 2018-01-15: 11:00:00 via INTRAVENOUS

## 2018-01-15 MED ORDER — MIDAZOLAM HCL 2 MG/2ML IJ SOLN
INTRAMUSCULAR | Status: AC
  Administered 2018-01-15: 1 mg via INTRAVENOUS
  Filled 2018-01-15: qty 2

## 2018-01-15 MED ORDER — MEPERIDINE HCL 50 MG/ML IJ SOLN: 6.2500 mg | INTRAMUSCULAR | Status: DC | PRN

## 2018-01-15 MED ORDER — ROPIVACAINE HCL 5 MG/ML IJ SOLN
INTRAMUSCULAR | Status: DC | PRN
  Administered 2018-01-15: 30 mL via PERINEURAL

## 2018-01-15 MED ORDER — ACETAMINOPHEN 10 MG/ML IV SOLN: 1000.0000 mg | Freq: Once | INTRAVENOUS | Status: DC | PRN

## 2018-01-15 MED ORDER — CHLORHEXIDINE GLUCONATE 4 % EX LIQD: 60.0000 mL | Freq: Once | CUTANEOUS | Status: DC

## 2018-01-15 MED ORDER — PROPOFOL 500 MG/50ML IV EMUL
INTRAVENOUS | Status: DC | PRN
  Administered 2018-01-15: 75 ug/kg/min via INTRAVENOUS

## 2018-01-15 MED ORDER — PROMETHAZINE HCL 25 MG/ML IJ SOLN: 6.2500 mg | INTRAMUSCULAR | Status: DC | PRN

## 2018-01-15 SURGICAL SUPPLY — 33 items
BLADE SURG 21 STRL SS (BLADE) ×1 IMPLANT
BNDG CMPR 9X4 STRL LF SNTH (GAUZE/BANDAGES/DRESSINGS)
BNDG COHESIVE 4X5 TAN STRL (GAUZE/BANDAGES/DRESSINGS) ×3 IMPLANT
BNDG ESMARK 4X9 LF (GAUZE/BANDAGES/DRESSINGS) IMPLANT
BNDG GAUZE ELAST 4 BULKY (GAUZE/BANDAGES/DRESSINGS) IMPLANT
COVER SURGICAL LIGHT HANDLE (MISCELLANEOUS) ×3 IMPLANT
DRAPE U-SHAPE 47X51 STRL (DRAPES) ×3 IMPLANT
DRESSING PREVENA PLUS CUSTOM (GAUZE/BANDAGES/DRESSINGS) IMPLANT
DRSG ADAPTIC 3X8 NADH LF (GAUZE/BANDAGES/DRESSINGS) ×2 IMPLANT
DRSG PAD ABDOMINAL 8X10 ST (GAUZE/BANDAGES/DRESSINGS) ×3 IMPLANT
DRSG PREVENA PLUS CUSTOM (GAUZE/BANDAGES/DRESSINGS) ×3
DURAPREP 26ML APPLICATOR (WOUND CARE) ×3 IMPLANT
ELECT REM PT RETURN 9FT ADLT (ELECTROSURGICAL) ×3
ELECTRODE REM PT RTRN 9FT ADLT (ELECTROSURGICAL) ×1 IMPLANT
GAUZE SPONGE 4X4 12PLY STRL (GAUZE/BANDAGES/DRESSINGS) IMPLANT
GAUZE SPONGE 4X4 12PLY STRL LF (GAUZE/BANDAGES/DRESSINGS) ×3 IMPLANT
GLOVE BIOGEL PI IND STRL 9 (GLOVE) ×1 IMPLANT
GLOVE BIOGEL PI INDICATOR 9 (GLOVE) ×2
GLOVE SURG ORTHO 9.0 STRL STRW (GLOVE) ×3 IMPLANT
GOWN STRL REUS W/ TWL XL LVL3 (GOWN DISPOSABLE) ×2 IMPLANT
GOWN STRL REUS W/TWL XL LVL3 (GOWN DISPOSABLE) ×6
KIT BASIN OR (CUSTOM PROCEDURE TRAY) ×3 IMPLANT
KIT DRSG PREVENA PLUS 7DAY 125 (MISCELLANEOUS) ×2 IMPLANT
KIT PREVENA INCISION MGT 13 (CANNISTER) ×2 IMPLANT
KIT TURNOVER KIT B (KITS) ×3 IMPLANT
MANIFOLD NEPTUNE II (INSTRUMENTS) IMPLANT
NEEDLE 22X1 1/2 (OR ONLY) (NEEDLE) IMPLANT
NS IRRIG 1000ML POUR BTL (IV SOLUTION) ×3 IMPLANT
PACK ORTHO EXTREMITY (CUSTOM PROCEDURE TRAY) ×3 IMPLANT
PAD ARMBOARD 7.5X6 YLW CONV (MISCELLANEOUS) ×6 IMPLANT
SUT ETHILON 2 0 PSLX (SUTURE) ×3 IMPLANT
SYR CONTROL 10ML LL (SYRINGE) IMPLANT
TOWEL OR 17X26 10 PK STRL BLUE (TOWEL DISPOSABLE) ×3 IMPLANT

## 2018-01-15 NOTE — Anesthesia Procedure Notes (Signed)
Procedure Name: MAC Date/Time: 01/15/2018 1:27 PM Performed by: Carney Living, CRNA Pre-anesthesia Checklist: Patient identified, Emergency Drugs available, Patient being monitored, Suction available and Timeout performed Patient Re-evaluated:Patient Re-evaluated prior to induction Oxygen Delivery Method: Simple face mask Preoxygenation: Pre-oxygenation with 100% oxygen

## 2018-01-15 NOTE — Anesthesia Preprocedure Evaluation (Addendum)
Anesthesia Evaluation  Patient identified by MRN, date of birth, ID band  History of Anesthesia Complications (+) PONV and history of anesthetic complications  Airway Mallampati: III  TM Distance: >3 FB Neck ROM: Full    Dental no notable dental hx. (+) Teeth Intact, Dental Advisory Given   Pulmonary shortness of breath and with exertion, sleep apnea and Continuous Positive Airway Pressure Ventilation , pneumonia,    Pulmonary exam normal breath sounds clear to auscultation       Cardiovascular hypertension, Pt. on medications + CAD and +CHF  Normal cardiovascular exam Rhythm:Regular Rate:Normal  Echo 06/12/15:  - Left ventricle: The cavity size was normal. Systolic function was normal. The estimated ejection fraction was in the range of 55%to 60%. Wall motion was normal; there were no regional wallmotion abnormalities. - Aortic valve: There was trivial regurgitation. - Mitral valve: Calcified annulus. There was mild regurgitation. - Left atrium: The atrium was moderately dilated. - Right atrium: The atrium was mildly dilated. - Atrial septum: No defect or patent foramen ovale was identified. - Pulmonary arteries: PA peak pressure: 37 mm Hg (S).      Neuro/Psych TIACVA, Residual Symptoms    GI/Hepatic Neg liver ROS, hiatal hernia, GERD  Medicated and Controlled,  Endo/Other  diabetes, Type 2, Insulin Dependent, Oral Hypoglycemic AgentsMorbid obesity  Renal/GU      Musculoskeletal  (+) Arthritis , Osteoarthritis,    Abdominal (+) + obese,   Peds  Hematology  (+) anemia ,   Anesthesia Other Findings   Reproductive/Obstetrics                            Lab Results  Component Value Date   CREATININE 1.55 (H) 12/14/2017   BUN 31 (H) 12/14/2017   NA 140 12/14/2017   K 4.3 12/14/2017   CL 100 (L) 12/14/2017   CO2 28 12/14/2017    Lab Results  Component Value Date   WBC 9.1 09/30/2017   HGB 12.1 (L) 09/30/2017   HCT 37.3 (L) 09/30/2017   MCV 92.3 09/30/2017   PLT 219 09/30/2017    Anesthesia Physical Anesthesia Plan  ASA: IV  Anesthesia Plan: Regional and MAC   Post-op Pain Management:    Induction:   PONV Risk Score and Plan: Treatment may vary due to age or medical condition  Airway Management Planned: Mask, Natural Airway and Nasal Cannula  Additional Equipment:   Intra-op Plan:   Post-operative Plan:   Informed Consent: I have reviewed the patients History and Physical, chart, labs and discussed the procedure including the risks, benefits and alternatives for the proposed anesthesia with the patient or authorized representative who has indicated his/her understanding and acceptance.   Dental advisory given  Plan Discussed with: CRNA, Anesthesiologist and Surgeon  Anesthesia Plan Comments:        Anesthesia Quick Evaluation

## 2018-01-15 NOTE — Op Note (Signed)
01/15/2018  1:54 PM  PATIENT:  Oscar [name].    PRE-OPERATIVE DIAGNOSIS:  Osteomyelitis Left 2nd Toe  POST-OPERATIVE DIAGNOSIS:  Same  PROCEDURE:  LEFT FOOT 2ND TOE AMPUTATION  SURGEON:  Newt Minion, MD  PHYSICIAN ASSISTANT:None ANESTHESIA:   General  PREOPERATIVE INDICATIONS:  Oscar [name]. is a  72 y.o. male with a diagnosis of Osteomyelitis Left 2nd Toe who failed conservative measures and elected for surgical management.    The risks benefits and alternatives were discussed with the patient preoperatively including but not limited to the risks of infection, bleeding, nerve injury, cardiopulmonary complications, the need for revision surgery, among others, and the patient was willing to proceed.  OPERATIVE IMPLANTS: None.  @ENCIMAGES @  OPERATIVE FINDINGS: Good petechial bleeding  OPERATIVE PROCEDURE: Patient was brought the operating room and underwent a general anesthesia.  After adequate levels of anesthesia were obtained patient's left lower extremity was prepped using DuraPrep draped in the sterile field a timeout was called.  A fishmouth incision was made to be in line with the previous medial incision.  The second toe was amputated through the MTP joint this was irrigated with normal saline electrocautery was used for hemostasis.  There was good petechial bleeding.  The incision was closed using 2-0 nylon.  A sterile dressing was applied patient was taken to PACU in stable condition.   DISCHARGE PLANNING:  Antibiotic duration: Preoperative 3 g Kefzol  Weightbearing: Nonweightbearing on the left  Pain medication: Prescription for Percocet  Dressing care/ Wound VAC: Change dressing in 5 days  Ambulatory devices: Walker or kneeling scooter  Discharge to: Home.  Follow-up: In the office 1 week post operative.

## 2018-01-15 NOTE — Anesthesia Procedure Notes (Signed)
Anesthesia Regional Block: Popliteal block   Pre-Anesthetic Checklist: ,, timeout performed, Correct Patient, Correct Site, Correct Laterality, Correct Procedure, Correct Position, site marked, Risks and benefits discussed, pre-op evaluation,  At surgeon's request and post-op pain management  Laterality: Left  Prep: Maximum Sterile Barrier Precautions used, chloraprep       Needles:  Injection technique: Single-shot  Needle Type: Echogenic Needle     Needle Length: 9cm  Needle Gauge: 21     Additional Needles:   Procedures:,,,, ultrasound used (permanent image in chart),,,,  Narrative:  Start time: 01/15/2018 11:40 AM End time: 01/15/2018 11:50 AM Injection made incrementally with aspirations every 5 mL. Anesthesiologist: Barnet Glasgow, MD

## 2018-01-15 NOTE — Transfer of Care (Signed)
Immediate Anesthesia Transfer of Care Note  Patient: Oscar [name].  Procedure(s) Performed: LEFT FOOT 2ND TOE AMPUTATION (Left )  Patient Location: PACU  Anesthesia Type:MAC and MAC combined with regional for post-op pain  Level of Consciousness: awake, alert , oriented and patient cooperative  Airway & Oxygen Therapy: Patient Spontanous Breathing and Patient connected to nasal cannula oxygen  Post-op Assessment: Report given to RN, Post -op Vital signs reviewed and stable and Patient moving all extremities X 4  Post vital signs: Reviewed and stable  Last Vitals:  Vitals Value Taken Time  BP 119/65 01/15/2018  1:56 PM  Temp 36.7 C 01/15/2018  1:56 PM  Pulse 64 01/15/2018  1:58 PM  Resp 13 01/15/2018  1:58 PM  SpO2 97 % 01/15/2018  1:58 PM  Vitals shown include unvalidated device data.  Last Pain:  Vitals:   01/15/18 1356  TempSrc:   PainSc: 0-No pain      Patients Stated Pain Goal: 0 (20/25/42 7062)  Complications: No apparent anesthesia complications

## 2018-01-15 NOTE — H&P (Signed)
Oscar [name]. is an 72 y.o. male.   Chief Complaint: Ulceration swelling cellulitis left foot second toe HPI: Patient is a 72 year old gentleman status post left foot first ray amputation approximately 3 months ago who presents with ulceration cellulitis and swelling of the second toe.  Patient states that he is fallen twice recently due to being knocked over by children    Past Medical History:  Diagnosis Date  . Anal fissure    h/o - no recent complications  . Arthritis    hips  . Atrial fibrillation ([place])   . Depression   . Diabetes mellitus without complication ([place])    improved after diet/exercise  . Enlarged prostate   . GERD (gastroesophageal reflux disease)   . Gout   . H/O hiatal hernia   . History of kidney stones   . Hx of typhoid fever    as a child  . Hypertension    improved after diet/exercise  . Memory change 04/14/2017  . OSA (obstructive sleep apnea)    wears cpap  . Pneumonia   . PONV (postoperative nausea and vomiting)    also difficulty waking up  . Stroke [facility]) 08/2013   short term memory loss (slight)    Past Surgical History:  Procedure Laterality Date  . AMPUTATION Left 10/02/2017   Procedure: LEFT FOOT 1ST RAY AMPUTATION;  Surgeon: Newt Minion, MD;  Location: [place];  Service: Orthopedics;  Laterality: Left;  . carpel tunnel Bilateral   . CATARACT EXTRACTION W/ INTRAOCULAR LENS  IMPLANT, BILATERAL    . CHOLECYSTECTOMY    . COLONOSCOPY    . EYE SURGERY     laser surgery   . I&D EXTREMITY Left 09/15/2014   Procedure: Excision Necrotic Left Achilles, Skin Graft, apply Wound VAC;  Surgeon: Newt Minion, MD;  Location: [place];  Service: Orthopedics;  Laterality: Left;  . KIDNEY STONE SURGERY    . KNEE ARTHROSCOPY Bilateral   . LIGAMENT REPAIR    . TONSILLECTOMY    . VASECTOMY      Family History  Problem Relation Age of Onset  . COPD Father   . Heart attack Sister        died age 53 of MI   Social History:  reports that he has never  smoked. He has never used smokeless tobacco. He reports that he drinks alcohol. He reports that he does not use drugs.  Allergies:  Allergies  Allergen Reactions  . Iohexol Hives and Other (See Comments)     Desc: HIVES S/P 13HR.PREMEDS, ?LOW DOSAGE PREMEDS   . Ivp Dye [Iodinated Diagnostic Agents] Hives and Other (See Comments)    welps     Medications Prior to Admission  Medication Sig Dispense Refill  . acetaminophen (TYLENOL) 500 MG tablet Take 1,000 mg by mouth 2 (two) times daily as needed for moderate pain.     Marland Kitchen allopurinol (ZYLOPRIM) 300 MG tablet Take 300 mg by mouth daily.  11  . apixaban (ELIQUIS) 5 MG TABS tablet TAKE 1 TABLET (5 MG TOTAL) BY MOUTH 2 (TWO) TIMES DAILY. 60 tablet 1  . B-D INS SYRINGE 0.5CC/30GX1/2" 30G X 1/2" 0.5 ML MISC USE THREE TIMES A DAY DX-E11.65 INJECTION  2  . blood glucose meter kit and supplies KIT Dispense based on patient and insurance preference. Use up to four times daily as directed. (FOR ICD-9 250.00, 250.01). 1 each 0  . buPROPion (WELLBUTRIN SR) 150 MG 12 hr tablet Take 150 mg  by mouth daily.  5  . diltiazem (CARDIZEM CD) 360 MG 24 hr capsule Take 1 capsule (360 mg total) by mouth daily. 90 capsule 0  . escitalopram (LEXAPRO) 20 MG tablet Take 40 mg by mouth daily.  3  . fexofenadine (ALLEGRA) 180 MG tablet Take 180 mg by mouth daily as needed for allergies or rhinitis.    . furosemide (LASIX) 80 MG tablet Take 1 tablet (80 mg total) by mouth daily. T 90 tablet 3  . indomethacin (INDOCIN) 50 MG capsule Take 1 capsule (50 mg total) by mouth 3 (three) times daily as needed (for gout flare ups). Reported on 12/27/2015 90 capsule 1  . insulin aspart (NOVOLOG) 100 UNIT/ML injection Inject 50 Units into the skin 3 (three) times daily as needed for high blood sugar.     Marland Kitchen LEVEMIR 100 UNIT/ML injection Inject 50 Units into the skin at bedtime.   5  . metFORMIN (GLUCOPHAGE-XR) 500 MG 24 hr tablet Take 500 mg by mouth 2 (two) times daily.    .  metolazone (ZAROXOLYN) 2.5 MG tablet Take 1 tablet every Wednesday. 10 tablet 3  . potassium chloride SA (K-DUR,KLOR-CON) 20 MEQ tablet Take 2 tablets (40 mEq total) by mouth daily. (Patient taking differently: Take 20 mEq by mouth 2 (two) times daily. ) 180 tablet 3  . simvastatin (ZOCOR) 20 MG tablet Take 20 mg by mouth every evening.  3  . tamsulosin (FLOMAX) 0.4 MG CAPS capsule Take 1 capsule (0.4 mg total) by mouth daily after breakfast. (Patient taking differently: Take 0.4 mg by mouth 2 (two) times daily. ) 30 capsule 0  . doxycycline (VIBRA-TABS) 100 MG tablet Take 1 tablet (100 mg total) by mouth 2 (two) times daily. (Patient not taking: Reported on 01/13/2018) 60 tablet 0  . mupirocin ointment (BACTROBAN) 2 % Apply 1 application topically 2 (two) times daily. Apply to the affected area 2 times a day (Patient not taking: Reported on 01/13/2018) 22 g 3  . oxyCODONE-acetaminophen (PERCOCET/ROXICET) 5-325 MG tablet Take 1 tablet by mouth every 4 (four) hours as needed for severe pain. (Patient not taking: Reported on 01/13/2018) 60 tablet 0  . pantoprazole (PROTONIX) 40 MG tablet Take 1 tablet (40 mg total) by mouth daily. (Patient not taking: Reported on 01/13/2018) 30 tablet 1  . pentoxifylline (TRENTAL) 400 MG CR tablet Take 1 tablet (400 mg total) by mouth 3 (three) times daily with meals. (Patient not taking: Reported on 01/13/2018) 90 tablet 3    Results for orders placed or performed during the hospital encounter of 01/15/18 (from the past 48 hour(s))  Glucose, capillary     Status: Abnormal   Collection Time: 01/15/18 10:57 AM  Result Value Ref Range   Glucose-Capillary 236 (H) 65 - 99 mg/dL  Basic metabolic panel     Status: Abnormal   Collection Time: 01/15/18 11:07 AM  Result Value Ref Range   Sodium 140 135 - 145 mmol/L   Potassium 3.8 3.5 - 5.1 mmol/L   Chloride 104 101 - 111 mmol/L   CO2 23 22 - 32 mmol/L   Glucose, Bld 218 (H) 65 - 99 mg/dL   BUN 41 (H) 6 - 20 mg/dL   Creatinine,  Ser 1.67 (H) 0.61 - 1.24 mg/dL   Calcium 9.4 8.9 - 10.3 mg/dL   GFR calc non Af Amer 39 (L) >60 mL/min   GFR calc Af Amer 46 (L) >60 mL/min    Comment: (NOTE) The eGFR has been calculated using  the CKD EPI equation. This calculation has not been validated in all clinical situations. eGFR's persistently <60 mL/min signify possible Chronic Kidney Disease.    Anion gap 13 5 - 15    Comment: Performed at [facility] [address], [place], [place] [address]  CBC     Status: Abnormal   Collection Time: 01/15/18 11:07 AM  Result Value Ref Range   WBC 9.3 4.0 - 10.5 K/uL   RBC 4.22 4.22 - 5.81 MIL/uL   Hemoglobin 12.0 (L) 13.0 - 17.0 g/dL   HCT 38.0 (L) 39.0 - 52.0 %   MCV 90.0 78.0 - 100.0 fL   MCH 28.4 26.0 - 34.0 pg   MCHC 31.6 30.0 - 36.0 g/dL   RDW 15.9 (H) 11.5 - 15.5 %   Platelets 209 150 - 400 K/uL    Comment: Performed at [facility], [place] [address] [place], [place] [address]  Protime-INR     Status: Abnormal   Collection Time: 01/15/18 11:07 AM  Result Value Ref Range   Prothrombin Time 18.6 (H) 11.4 - 15.2 seconds   INR 1.57     Comment: Performed at [facility] [address] [place], [place] [address]   No results found.  Review of Systems  All other systems reviewed and are negative.   Blood pressure 136/68, pulse 77, temperature 98.7 F (37.1 C), temperature source Oral, resp. rate 15, height '5\' 9"'$  (1.753 m), weight 280 lb (127 kg), SpO2 99 %. Physical Exam  Patient is alert, oriented, no adenopathy, well-dressed, normal affect, normal respiratory effort. Examination patient has a palpable pulse he has cellulitis and sausage digit swelling of the left foot second toe.  The plantar ulcer probes to bone.   Assessment/Plan 1. Osteomyelitis of toe of left foot (HCC)     Plan: Discussed that with the osteomyelitis of the second toe the best option is a second toe amputation left foot.  Discussed that after this heals we will set him up  with outpatient physical therapy for strengthening and balance.  Recommend using a cane at this time to prevent recurrent falls.     Newt Minion, MD 01/15/2018, 1:18 PM

## 2018-01-15 NOTE — Progress Notes (Signed)
Orthopedic Tech Progress Note Patient Details:  Oscar [name] [birth year] [id number]  Ortho Devices Type of Ortho Device: Postop shoe/boot Ortho Device/Splint Interventions: Application   Post Interventions Patient Tolerated: Well Instructions Provided: Care of device   [place] Pink 01/15/2018, 2:23 PM

## 2018-01-18 ENCOUNTER — Encounter (HOSPITAL_COMMUNITY): Payer: Self-pay | Admitting: Orthopedic Surgery

## 2018-01-18 ENCOUNTER — Telehealth (INDEPENDENT_AMBULATORY_CARE_PROVIDER_SITE_OTHER): Payer: Self-pay

## 2018-01-18 NOTE — Anesthesia Postprocedure Evaluation (Signed)
Anesthesia Post Note  Patient: Oscar [name].  Procedure(s) Performed: LEFT FOOT 2ND TOE AMPUTATION (Left )     Patient location during evaluation: PACU Anesthesia Type: Regional Level of consciousness: awake and alert Pain management: pain level controlled Vital Signs Assessment: post-procedure vital signs reviewed and stable Respiratory status: spontaneous breathing, nonlabored ventilation, respiratory function stable and patient connected to nasal cannula oxygen Cardiovascular status: stable and blood pressure returned to baseline Postop Assessment: no apparent nausea or vomiting Anesthetic complications: no    Last Vitals:  Vitals:   01/15/18 1413 01/15/18 1445  BP: 128/68 124/66  Pulse: (!) 58 63  Resp: 17 14  Temp: 36.7 C 36.7 C  SpO2: 97% 98%    Last Pain:  Vitals:   01/15/18 1413  TempSrc:   PainSc: 0-No pain                 Tiajuana Amass

## 2018-01-18 NOTE — Telephone Encounter (Signed)
Patients wife called stating that patient had surgery on Friday afternoon with Dr Sharol Given for 2nd toe amp. Initially had a very large amount of bleeding and they had to remove the post op bandage and have changed bandage twice since surgery. The bleeding has stopped as of now but the family (his 3 children who are nurses) are concerned about infection for the patient. They wanted to know if anything needed to be done? Does he need an abx? Does he need to come in sooner? They also had concerns that patient never d/c'd his blood thinner prior to surgery.  His post op appt is scheduled for this Thursday. Please call to further advise. [phone]

## 2018-01-18 NOTE — Telephone Encounter (Signed)
I called and advised pt's wife that now that the surgical bandage has been removed the pt should change the dressing everyday. To monitor for any s/s of infections. Advised no abx needed given IV day of surgery. To keep his appt on Thursday and call with any other questions.

## 2018-01-21 ENCOUNTER — Ambulatory Visit (INDEPENDENT_AMBULATORY_CARE_PROVIDER_SITE_OTHER): Payer: Medicare Other | Admitting: Orthopedic Surgery

## 2018-01-21 ENCOUNTER — Encounter (INDEPENDENT_AMBULATORY_CARE_PROVIDER_SITE_OTHER): Payer: Self-pay | Admitting: Orthopedic Surgery

## 2018-01-21 DIAGNOSIS — S98132A Complete traumatic amputation of one left lesser toe, initial encounter: Secondary | ICD-10-CM

## 2018-01-21 DIAGNOSIS — Z6841 Body Mass Index (BMI) 40.0 and over, adult: Secondary | ICD-10-CM

## 2018-01-21 NOTE — Progress Notes (Signed)
Office Visit Note   Patient: Oscar [name].           Date of Birth: [birth year]           MRN: [id number] Visit Date: 01/21/2018              Requested by: Josetta Huddle, MD [address] [address] [place], [place] [address] PCP: Josetta Huddle, MD  Chief Complaint  Patient presents with  . Left Foot - Pain, Routine Post Op      HPI: Patient is a 72 year old gentleman who presents 1 week status post left foot second toe amputation he is also status post a first ray amputation on the left foot as well.  Patient states that the day after surgery he got up at night fell and landed on his left side with ecchymosis and bruising on the left side.  He is on Eliquis for blood thinner.  Assessment & Plan: Visit Diagnoses:  1. Amputated toe, left (HCC)     Plan: Recommended protected weightbearing on the left knee use his [place] sneakers continue using his pain follow-up in the office in 2 weeks to remove the sutures.  Follow-Up Instructions: Return in about 2 weeks (around 02/04/2018).   Ortho Exam  Patient is alert, oriented, no adenopathy, well-dressed, normal affect, normal respiratory effort. Examination the wound edges are well approximated.  There is no redness no cellulitis no drainage no infection.  Patient does have ecchymosis and bruising on his entire left side.  Patient has no pain along the midline.  No shortness of breath.  Imaging: No results found. No images are attached to the encounter.  Labs: Lab Results  Component Value Date   HGBA1C 10.1 (H) 09/08/2016   HGBA1C 9.1 (H) 08/28/2014   HGBA1C 10.1 (H) 08/07/2013   ESRSEDRATE 75 (H) 08/28/2014   ESRSEDRATE 42 (H) 11/17/2012   CRP 12.9 (H) 08/28/2014   LABURIC 3.0 (L) 08/31/2014   REPTSTATUS 10/03/2017 FINAL 09/30/2017   GRAMSTAIN  09/30/2017    RARE WBC PRESENT, PREDOMINANTLY MONONUCLEAR NO ORGANISMS SEEN    CULT  09/30/2017    NO GROWTH 2 DAYS Performed at [facility], [place] [address]  [place], [place] [address]      Lab Results  Component Value Date   ALBUMIN 3.3 (L) 09/07/2016   ALBUMIN 3.6 09/15/2014   ALBUMIN 3.1 (L) 08/29/2014   LABURIC 3.0 (L) 08/31/2014    There is no height or weight on file to calculate BMI.  Orders:  No orders of the defined types were placed in this encounter.  No orders of the defined types were placed in this encounter.    Procedures: No procedures performed  Clinical Data: No additional findings.  ROS:  All other systems negative, except as noted in the HPI. Review of Systems  Objective: Vital Signs: There were no vitals taken for this visit.  Specialty Comments:  No specialty comments available.  PMFS History: Patient Active Problem List   Diagnosis Date Noted  . Amputated toe, left ([place]) 01/21/2018  . Achilles tendon contracture, left 12/28/2017  . Dehiscence of amputation stump ([place]) 11/10/2017  . Great toe amputation status, left ([place]) 11/10/2017  . Subacute osteomyelitis, left ankle and foot ([place])   . Memory change 04/14/2017  . Ulcer of left second toe, limited to breakdown of skin ([place]) 04/09/2017  . Cutaneous abscess of left foot 04/09/2017  . Non-pressure chronic ulcer of right heel and midfoot limited to breakdown  of skin ([place]) 10/07/2016  . Influenza A   . AKI (acute kidney injury) ([place]) 09/07/2016  . Acute encephalopathy 09/07/2016  . Acute respiratory failure with hypoxia ([place]) 09/07/2016  . Elevated lactic acid level 09/07/2016  . Elevated troponin 09/07/2016  . Coronary artery calcification seen on CT scan 10/28/2015  . Acute on chronic diastolic (congestive) heart failure ([place]) 09/26/2015  . OSA (obstructive sleep apnea) 02/07/2015  . Sepsis ([place]) 08/28/2014  . Chronic diastolic heart failure ([place]) 09/21/2013  . CVA (cerebral infarction) 08/08/2013  . Dizziness 08/07/2013  . TIA (transient ischemic attack) 08/07/2013  . Atrial fibrillation ([place]) 08/07/2013  . Hypertension 08/07/2013  . Anemia  08/07/2013  . Dyspnea 06/21/2013  . Chest pain 11/16/2012  . Atrial fibrillation with RVR ([place]) 11/16/2012  . Gout 11/16/2012  . Diabetes mellitus type 2, uncontrolled, with complications ([place]) [birth year]1937  . CKD (chronic kidney disease), stage II 11/16/2012  . Atypical atrial flutter ([place]) 11/16/2012  . Pleuritic chest pain 11/16/2012  . Morbid obesity ([place]) 11/16/2012  . Obstructive sleep apnea 11/16/2012   Past Medical History:  Diagnosis Date  . Anal fissure    h/o - no recent complications  . Arthritis    hips  . Atrial fibrillation ([place])   . Depression   . Diabetes mellitus without complication ([place])    improved after diet/exercise  . Enlarged prostate   . GERD (gastroesophageal reflux disease)   . Gout   . H/O hiatal hernia   . History of kidney stones   . Hx of typhoid fever    as a child  . Hypertension    improved after diet/exercise  . Memory change 04/14/2017  . OSA (obstructive sleep apnea)    wears cpap  . Pneumonia   . PONV (postoperative nausea and vomiting)    also difficulty waking up  . Stroke [facility]) 08/2013   short term memory loss (slight)    Family History  Problem Relation Age of Onset  . COPD Father   . Heart attack Sister        died age 56 of MI    Past Surgical History:  Procedure Laterality Date  . AMPUTATION Left 10/02/2017   Procedure: LEFT FOOT 1ST RAY AMPUTATION;  Surgeon: Newt Minion, MD;  Location: [place];  Service: Orthopedics;  Laterality: Left;  . AMPUTATION Left 01/15/2018   Procedure: LEFT FOOT 2ND TOE AMPUTATION;  Surgeon: Newt Minion, MD;  Location: [place];  Service: Orthopedics;  Laterality: Left;  . carpel tunnel Bilateral   . CATARACT EXTRACTION W/ INTRAOCULAR LENS  IMPLANT, BILATERAL    . CHOLECYSTECTOMY    . COLONOSCOPY    . EYE SURGERY     laser surgery   . I&D EXTREMITY Left 09/15/2014   Procedure: Excision Necrotic Left Achilles, Skin Graft, apply Wound VAC;  Surgeon: Newt Minion, MD;  Location: [place];  Service:  Orthopedics;  Laterality: Left;  . KIDNEY STONE SURGERY    . KNEE ARTHROSCOPY Bilateral   . LIGAMENT REPAIR    . TONSILLECTOMY    . VASECTOMY     Social History   Occupational History  . Occupation: Retired  Tobacco Use  . Smoking status: Never Smoker  . Smokeless tobacco: Never Used  Substance and Sexual Activity  . Alcohol use: Yes    Alcohol/week: 0.0 oz    Comment: rare - 1-2x /yr  . Drug use: No  . Sexual activity: Not on file

## 2018-02-05 ENCOUNTER — Other Ambulatory Visit (INDEPENDENT_AMBULATORY_CARE_PROVIDER_SITE_OTHER): Payer: Self-pay | Admitting: Orthopedic Surgery

## 2018-02-09 ENCOUNTER — Ambulatory Visit (INDEPENDENT_AMBULATORY_CARE_PROVIDER_SITE_OTHER): Payer: Medicare Other | Admitting: Orthopedic Surgery

## 2018-02-09 ENCOUNTER — Encounter (INDEPENDENT_AMBULATORY_CARE_PROVIDER_SITE_OTHER): Payer: Self-pay | Admitting: Orthopedic Surgery

## 2018-02-09 VITALS — Ht 69.0 in | Wt 280.0 lb

## 2018-02-09 DIAGNOSIS — S98132A Complete traumatic amputation of one left lesser toe, initial encounter: Secondary | ICD-10-CM

## 2018-02-09 NOTE — Progress Notes (Signed)
Office Visit Note   Patient: Oscar [name].           Date of Birth: Mar 27, 1946           MRN: [id number] Visit Date: 02/09/2018              Requested by: Josetta Huddle, MD [address] [place], [place] [address] PCP: Josetta Huddle, MD  Chief Complaint  Patient presents with  . Left Foot - Routine Post Op    01/15/18 left foot 2nd toe amputation  10/02/17 left foot first ray amputation       HPI: Patient is a 72 year old gentleman who presents 3 weeks status post left foot second toe amputation status post first ray amputation.  Patient states that he scraped the lateral side of his foot on furniture has a small wound over the fifth metatarsal head.  Assessment & Plan: Visit Diagnoses:  1. Amputated toe, left ([place])     Plan: Patient does have ischemic changes in the forefoot secondary to microcirculatory deficit.  Recommend he use the prescription for Trintellix nitroglycerin patch minimize his weightbearing discussed that he does need to use a walker to help with balance eventually we will get him on a cane continue with his [place] shoes continue with the medical compression stockings.  Follow-Up Instructions: Return in about 1 month (around 03/09/2018).   Ortho Exam  Patient is alert, oriented, no adenopathy, well-dressed, normal affect, normal respiratory effort. Examination patient is a palpable dorsalis pedis and posterior tibial pulse he has a ischemic traumatic ulcer over the fifth metatarsal head which is about 5 mm in diameter.  The surgical incision is healed.  He has thin atrophic skin on his foot.  We will harvest the sutures today he was given instructions to start using his prescription for the Trental nitroglycerin patch minimize weightbearing compression stocking.  Imaging: No results found. No images are attached to the encounter.  Labs: Lab Results  Component Value Date   HGBA1C 10.1 (H) 09/08/2016   HGBA1C 9.1 (H) 08/28/2014   HGBA1C 10.1  (H) 08/07/2013   ESRSEDRATE 75 (H) 08/28/2014   ESRSEDRATE 42 (H) 11/17/2012   CRP 12.9 (H) 08/28/2014   LABURIC 3.0 (L) 08/31/2014   REPTSTATUS 10/03/2017 FINAL 09/30/2017   GRAMSTAIN  09/30/2017    RARE WBC PRESENT, PREDOMINANTLY MONONUCLEAR NO ORGANISMS SEEN    CULT  09/30/2017    NO GROWTH 2 DAYS Performed at [facility], [place] [address], [place], [place] [address]      Lab Results  Component Value Date   ALBUMIN 3.3 (L) 09/07/2016   ALBUMIN 3.6 09/15/2014   ALBUMIN 3.1 (L) 08/29/2014   LABURIC 3.0 (L) 08/31/2014    Body mass index is 41.35 kg/m.  Orders:  No orders of the defined types were placed in this encounter.  No orders of the defined types were placed in this encounter.    Procedures: No procedures performed  Clinical Data: No additional findings.  ROS:  All other systems negative, except as noted in the HPI. Review of Systems  Objective: Vital Signs: Ht 5\' 9"  (1.753 m)   Wt 280 lb (127 kg)   BMI 41.35 kg/m   Specialty Comments:  No specialty comments available.  PMFS History: Patient Active Problem List   Diagnosis Date Noted  . Amputated toe, left ([place]) 01/21/2018  . Achilles tendon contracture, left 12/28/2017  . Dehiscence of amputation stump ([place]) 11/10/2017  . Great toe  amputation status, left ([place]) 11/10/2017  . Subacute osteomyelitis, left ankle and foot ([place])   . Memory change 04/14/2017  . Ulcer of left second toe, limited to breakdown of skin ([place]) 04/09/2017  . Cutaneous abscess of left foot 04/09/2017  . Non-pressure chronic ulcer of right heel and midfoot limited to breakdown of skin ([place]) 10/07/2016  . Influenza A   . AKI (acute kidney injury) ([place]) 09/07/2016  . Acute encephalopathy 09/07/2016  . Acute respiratory failure with hypoxia ([place]) 09/07/2016  . Elevated lactic acid level 09/07/2016  . Elevated troponin 09/07/2016  . Coronary artery calcification seen on CT scan 10/28/2015  . Acute on chronic  diastolic (congestive) heart failure ([place]) 09/26/2015  . OSA (obstructive sleep apnea) 02/07/2015  . Sepsis ([place]) 08/28/2014  . Chronic diastolic heart failure ([place]) 09/21/2013  . CVA (cerebral infarction) 08/08/2013  . Dizziness 08/07/2013  . TIA (transient ischemic attack) 08/07/2013  . Atrial fibrillation ([place]) 08/07/2013  . Hypertension 08/07/2013  . Anemia 08/07/2013  . Dyspnea 06/21/2013  . Chest pain 11/16/2012  . Atrial fibrillation with RVR ([place]) 11/16/2012  . Gout 11/16/2012  . Diabetes mellitus type 2, uncontrolled, with complications ([place]) 99/37/1696  . CKD (chronic kidney disease), stage II 11/16/2012  . Atypical atrial flutter ([place]) 11/16/2012  . Pleuritic chest pain 11/16/2012  . Morbid obesity ([place]) 11/16/2012  . Obstructive sleep apnea 11/16/2012   Past Medical History:  Diagnosis Date  . Anal fissure    h/o - no recent complications  . Arthritis    hips  . Atrial fibrillation ([place])   . Depression   . Diabetes mellitus without complication ([place])    improved after diet/exercise  . Enlarged prostate   . GERD (gastroesophageal reflux disease)   . Gout   . H/O hiatal hernia   . History of kidney stones   . Hx of typhoid fever    as a child  . Hypertension    improved after diet/exercise  . Memory change 04/14/2017  . OSA (obstructive sleep apnea)    wears cpap  . Pneumonia   . PONV (postoperative nausea and vomiting)    also difficulty waking up  . Stroke [facility]) 08/2013   short term memory loss (slight)    Family History  Problem Relation Age of Onset  . COPD Father   . Heart attack Sister        died age 63 of MI    Past Surgical History:  Procedure Laterality Date  . AMPUTATION Left 10/02/2017   Procedure: LEFT FOOT 1ST RAY AMPUTATION;  Surgeon: Newt Minion, MD;  Location: [place];  Service: Orthopedics;  Laterality: Left;  . AMPUTATION Left 01/15/2018   Procedure: LEFT FOOT 2ND TOE AMPUTATION;  Surgeon: Newt Minion, MD;  Location: [facility] [facility];   Service: Orthopedics;  Laterality: Left;  . carpel tunnel Bilateral   . CATARACT EXTRACTION W/ INTRAOCULAR LENS  IMPLANT, BILATERAL    . CHOLECYSTECTOMY    . COLONOSCOPY    . EYE SURGERY     laser surgery   . I&D EXTREMITY Left 09/15/2014   Procedure: Excision Necrotic Left Achilles, Skin Graft, apply Wound VAC;  Surgeon: Newt Minion, MD;  Location: [facility];  Service: Orthopedics;  Laterality: Left;  . KIDNEY STONE SURGERY    . KNEE ARTHROSCOPY Bilateral   . LIGAMENT REPAIR    . TONSILLECTOMY    . VASECTOMY     Social History   Occupational History  . Occupation: Retired  Tobacco Use  . Smoking status: Never Smoker  . Smokeless tobacco: Never Used  Substance and Sexual Activity  . Alcohol use: Yes    Alcohol/week: 0.0 oz    Comment: rare - 1-2x /yr  . Drug use: No  . Sexual activity: Not on file

## 2018-02-17 ENCOUNTER — Telehealth (INDEPENDENT_AMBULATORY_CARE_PROVIDER_SITE_OTHER): Payer: Self-pay | Admitting: Orthopedic Surgery

## 2018-02-17 ENCOUNTER — Ambulatory Visit (INDEPENDENT_AMBULATORY_CARE_PROVIDER_SITE_OTHER): Payer: Medicare Other | Admitting: Orthopedic Surgery

## 2018-02-17 NOTE — Telephone Encounter (Signed)
Patient daughter called to stated that patient has to cancel appt today because of the heat and cant get him out of house. Wants an appt for tomorrow 02/18/18.  Please call daughter Bonnita Levan @ [phone]

## 2018-02-17 NOTE — Telephone Encounter (Signed)
Please call pt and make appt on Erin's sch for tomorrow 02/18/18

## 2018-02-18 ENCOUNTER — Encounter (INDEPENDENT_AMBULATORY_CARE_PROVIDER_SITE_OTHER): Payer: Self-pay | Admitting: Family

## 2018-02-18 ENCOUNTER — Ambulatory Visit (INDEPENDENT_AMBULATORY_CARE_PROVIDER_SITE_OTHER): Payer: Medicare Other | Admitting: Family

## 2018-02-18 VITALS — Ht 69.0 in | Wt 280.0 lb

## 2018-02-18 DIAGNOSIS — E1165 Type 2 diabetes mellitus with hyperglycemia: Secondary | ICD-10-CM

## 2018-02-18 DIAGNOSIS — E118 Type 2 diabetes mellitus with unspecified complications: Secondary | ICD-10-CM

## 2018-02-18 DIAGNOSIS — L97521 Non-pressure chronic ulcer of other part of left foot limited to breakdown of skin: Secondary | ICD-10-CM

## 2018-02-18 DIAGNOSIS — S98112A Complete traumatic amputation of left great toe, initial encounter: Secondary | ICD-10-CM

## 2018-02-18 DIAGNOSIS — IMO0002 Reserved for concepts with insufficient information to code with codable children: Secondary | ICD-10-CM

## 2018-02-18 MED ORDER — DOXYCYCLINE HYCLATE 100 MG PO TABS: 100.0000 mg | ORAL_TABLET | Freq: Two times a day (BID) | ORAL | 0 refills | Status: DC

## 2018-02-18 NOTE — Progress Notes (Signed)
Office Visit Note   Patient: Oscar [name].           Date of Birth: 10/16/1945           MRN: [id number] Visit Date: 02/18/2018              Requested by: Josetta Huddle, MD [address] [place], [place] [address] PCP: Josetta Huddle, MD  Chief Complaint  Patient presents with  . Left Foot - Routine Post Op    2nd toe amputation       HPI: Patient is a 72 year old gentleman who presents status post left foot second toe amputation status post first ray amputation. This is well healed. Seen today for concern of infection to ulceration to lateral foot, several weeks ago he scraped the lateral side of his foot on furniture has a small wound over the fifth metatarsal head.  Assessment & Plan: Visit Diagnoses:  1. Ulcer of left foot, limited to breakdown of skin ([place])   2. Diabetes mellitus type 2, uncontrolled, with complications ([place])   3. Great toe amputation status, left [facility])     Plan: Patient does have ischemic changes in the forefoot secondary to microcirculatory deficit.  Recommend he use the prescription for Trental, nitroglycerin patch minimize his weightbearing discussed that he does need to use a walker to help with balance. continue with the medical compression stockings.  Did provide a iodosorb tube to use daily.   Follow-Up Instructions: Return in about 2 weeks (around 03/04/2018).   Ortho Exam  Patient is alert, oriented, no adenopathy, well-dressed, normal affect, normal respiratory effort. Examination patient is a palpable dorsalis pedis and posterior tibial pulse he has a ischemic traumatic ulcer over the fifth metatarsal head which is about 5 mm in diameter. Is 1 mm deep.  The surgical incision is healed.  He has thin atrophic skin on his foot. Ulcer is tender, surrounding tissue tender. No erythema, no drainage no odor.  Imaging: No results found. No images are attached to the encounter.  Labs: Lab Results  Component Value Date   HGBA1C  10.1 (H) 09/08/2016   HGBA1C 9.1 (H) 08/28/2014   HGBA1C 10.1 (H) 08/07/2013   ESRSEDRATE 75 (H) 08/28/2014   ESRSEDRATE 42 (H) 11/17/2012   CRP 12.9 (H) 08/28/2014   LABURIC 3.0 (L) 08/31/2014   REPTSTATUS 10/03/2017 FINAL 09/30/2017   GRAMSTAIN  09/30/2017    RARE WBC PRESENT, PREDOMINANTLY MONONUCLEAR NO ORGANISMS SEEN    CULT  09/30/2017    NO GROWTH 2 DAYS Performed at [facility], [place] [address] [place], [place] [address]      Lab Results  Component Value Date   ALBUMIN 3.3 (L) 09/07/2016   ALBUMIN 3.6 09/15/2014   ALBUMIN 3.1 (L) 08/29/2014   LABURIC 3.0 (L) 08/31/2014    Body mass index is 41.35 kg/m.  Orders:  No orders of the defined types were placed in this encounter.  Meds ordered this encounter  Medications  . doxycycline (VIBRA-TABS) 100 MG tablet    Sig: Take 1 tablet (100 mg total) by mouth 2 (two) times daily.    Dispense:  28 tablet    Refill:  0     Procedures: No procedures performed  Clinical Data: No additional findings.  ROS:  All other systems negative, except as noted in the HPI. Review of Systems  Constitutional: Negative for chills and fever.  Skin: Positive for wound.    Objective: Vital Signs: Ht 5'  9" (1.753 m)   Wt 280 lb (127 kg)   BMI 41.35 kg/m   Specialty Comments:  No specialty comments available.  PMFS History: Patient Active Problem List   Diagnosis Date Noted  . Amputated toe, left ([place]) 01/21/2018  . Achilles tendon contracture, left 12/28/2017  . Dehiscence of amputation stump ([place]) 11/10/2017  . Great toe amputation status, left ([place]) 11/10/2017  . Memory change 04/14/2017  . Cutaneous abscess of left foot 04/09/2017  . Non-pressure chronic ulcer of right heel and midfoot limited to breakdown of skin ([place]) 10/07/2016  . Influenza A   . AKI (acute kidney injury) ([place]) 09/07/2016  . Acute encephalopathy 09/07/2016  . Acute respiratory failure with hypoxia ([place]) 09/07/2016  . Elevated lactic  acid level 09/07/2016  . Elevated troponin 09/07/2016  . Coronary artery calcification seen on CT scan 10/28/2015  . Acute on chronic diastolic (congestive) heart failure ([place]) 09/26/2015  . OSA (obstructive sleep apnea) 02/07/2015  . Sepsis ([place]) 08/28/2014  . Chronic diastolic heart failure ([place]) 09/21/2013  . CVA (cerebral infarction) 08/08/2013  . Dizziness 08/07/2013  . TIA (transient ischemic attack) 08/07/2013  . Atrial fibrillation ([place]) 08/07/2013  . Hypertension 08/07/2013  . Anemia 08/07/2013  . Dyspnea 06/21/2013  . Chest pain 11/16/2012  . Atrial fibrillation with RVR ([place]) 11/16/2012  . Gout 11/16/2012  . Diabetes mellitus type 2, uncontrolled, with complications ([place]) 40/98/1191  . CKD (chronic kidney disease), stage II 11/16/2012  . Atypical atrial flutter ([place]) 11/16/2012  . Pleuritic chest pain 11/16/2012  . Morbid obesity ([place]) 11/16/2012  . Obstructive sleep apnea 11/16/2012   Past Medical History:  Diagnosis Date  . Anal fissure    h/o - no recent complications  . Arthritis    hips  . Atrial fibrillation ([place])   . Depression   . Diabetes mellitus without complication ([place])    improved after diet/exercise  . Enlarged prostate   . GERD (gastroesophageal reflux disease)   . Gout   . H/O hiatal hernia   . History of kidney stones   . Hx of typhoid fever    as a child  . Hypertension    improved after diet/exercise  . Memory change 04/14/2017  . OSA (obstructive sleep apnea)    wears cpap  . Pneumonia   . PONV (postoperative nausea and vomiting)    also difficulty waking up  . Stroke [facility]) 08/2013   short term memory loss (slight)    Family History  Problem Relation Age of Onset  . COPD Father   . Heart attack Sister        died age 67 of MI    Past Surgical History:  Procedure Laterality Date  . AMPUTATION Left 10/02/2017   Procedure: LEFT FOOT 1ST RAY AMPUTATION;  Surgeon: Newt Minion, MD;  Location: [facility];  Service: Orthopedics;   Laterality: Left;  . AMPUTATION Left 01/15/2018   Procedure: LEFT FOOT 2ND TOE AMPUTATION;  Surgeon: Newt Minion, MD;  Location: [facility];  Service: Orthopedics;  Laterality: Left;  . carpel tunnel Bilateral   . CATARACT EXTRACTION W/ INTRAOCULAR LENS  IMPLANT, BILATERAL    . CHOLECYSTECTOMY    . COLONOSCOPY    . EYE SURGERY     laser surgery   . I&D EXTREMITY Left 09/15/2014   Procedure: Excision Necrotic Left Achilles, Skin Graft, apply Wound VAC;  Surgeon: Newt Minion, MD;  Location: [facility];  Service: Orthopedics;  Laterality: Left;  . KIDNEY STONE  SURGERY    . KNEE ARTHROSCOPY Bilateral   . LIGAMENT REPAIR    . TONSILLECTOMY    . VASECTOMY     Social History   Occupational History  . Occupation: Retired  Tobacco Use  . Smoking status: Never Smoker  . Smokeless tobacco: Never Used  Substance and Sexual Activity  . Alcohol use: Yes    Alcohol/week: 0.0 oz    Comment: rare - 1-2x /yr  . Drug use: No  . Sexual activity: Not on file

## 2018-03-15 ENCOUNTER — Encounter (INDEPENDENT_AMBULATORY_CARE_PROVIDER_SITE_OTHER): Payer: Self-pay | Admitting: Orthopedic Surgery

## 2018-03-15 ENCOUNTER — Ambulatory Visit (INDEPENDENT_AMBULATORY_CARE_PROVIDER_SITE_OTHER): Payer: Medicare Other | Admitting: Orthopedic Surgery

## 2018-03-15 DIAGNOSIS — E118 Type 2 diabetes mellitus with unspecified complications: Secondary | ICD-10-CM

## 2018-03-15 DIAGNOSIS — E1165 Type 2 diabetes mellitus with hyperglycemia: Secondary | ICD-10-CM | POA: Diagnosis not present

## 2018-03-15 DIAGNOSIS — L97521 Non-pressure chronic ulcer of other part of left foot limited to breakdown of skin: Secondary | ICD-10-CM | POA: Diagnosis not present

## 2018-03-15 DIAGNOSIS — IMO0002 Reserved for concepts with insufficient information to code with codable children: Secondary | ICD-10-CM

## 2018-03-24 ENCOUNTER — Encounter (INDEPENDENT_AMBULATORY_CARE_PROVIDER_SITE_OTHER): Payer: Self-pay | Admitting: Orthopedic Surgery

## 2018-03-24 NOTE — Progress Notes (Signed)
Office Visit Note   Patient: Oscar [name].           Date of Birth: 27-Aug-1945           MRN: [id number] Visit Date: 03/15/2018              Requested by: Josetta Huddle, MD [address] [place] [address] [place], [place] [address] PCP: Josetta Huddle, MD  Chief Complaint  Patient presents with  . Left Foot - Wound Check, Follow-up      HPI: Patient is a 72 year old gentleman status post left foot first ray and second toe amputation.  Patient states he is been doing a lot better but has developed a new ulcer beneath the fifth metatarsal head.  Assessment & Plan: Visit Diagnoses:  1. Diabetes mellitus type 2, uncontrolled, with complications ([place])   2. Ulcer of left foot, limited to breakdown of skin ([employer])     Plan: Recommended patient wear a well-padded sneaker like a [place] running shoe recommended exercise to improve his circulation recommend that he continue the nitroglycerin patch and Trental to help maintain his microcirculation.  Follow-Up Instructions: Return in about 2 months (around 05/15/2018).   Ortho Exam  Patient is alert, oriented, no adenopathy, well-dressed, normal affect, normal respiratory effort. Examination patient has a superficial Waggoner grade 1 ulcer beneath the fifth metatarsal head this has mild ischemic changes there is no full-thickness defect.  This was not removed there is no exposed bone or tendon no drainage no cellulitis.  Imaging: No results found. No images are attached to the encounter.  Labs: Lab Results  Component Value Date   HGBA1C 10.1 (H) 09/08/2016   HGBA1C 9.1 (H) 08/28/2014   HGBA1C 10.1 (H) 08/07/2013   ESRSEDRATE 75 (H) 08/28/2014   ESRSEDRATE 42 (H) 11/17/2012   CRP 12.9 (H) 08/28/2014   LABURIC 3.0 (L) 08/31/2014   REPTSTATUS 10/03/2017 FINAL 09/30/2017   GRAMSTAIN  09/30/2017    RARE WBC PRESENT, PREDOMINANTLY MONONUCLEAR NO ORGANISMS SEEN    CULT  09/30/2017    NO GROWTH 2 DAYS Performed at [facility], [place] [address] [place], [place] [address]      Lab Results  Component Value Date   ALBUMIN 3.3 (L) 09/07/2016   ALBUMIN 3.6 09/15/2014   ALBUMIN 3.1 (L) 08/29/2014   LABURIC 3.0 (L) 08/31/2014    There is no height or weight on file to calculate BMI.  Orders:  No orders of the defined types were placed in this encounter.  No orders of the defined types were placed in this encounter.    Procedures: No procedures performed  Clinical Data: No additional findings.  ROS:  All other systems negative, except as noted in the HPI. Review of Systems  Objective: Vital Signs: There were no vitals taken for this visit.  Specialty Comments:  No specialty comments available.  PMFS History: Patient Active Problem List   Diagnosis Date Noted  . Amputated toe, left ([place]) 01/21/2018  . Achilles tendon contracture, left 12/28/2017  . Dehiscence of amputation stump ([place]) 11/10/2017  . Great toe amputation status, left ([place]) 11/10/2017  . Memory change 04/14/2017  . Cutaneous abscess of left foot 04/09/2017  . Non-pressure chronic ulcer of right heel and midfoot limited to breakdown of skin ([place]) 10/07/2016  . Influenza A   . AKI (acute kidney injury) ([place]) 09/07/2016  . Acute encephalopathy 09/07/2016  . Acute respiratory failure with hypoxia ([place]) 09/07/2016  . Elevated lactic acid  level 09/07/2016  . Elevated troponin 09/07/2016  . Coronary artery calcification seen on CT scan 10/28/2015  . Acute on chronic diastolic (congestive) heart failure ([place]) 09/26/2015  . OSA (obstructive sleep apnea) 02/07/2015  . Sepsis ([place]) 08/28/2014  . Chronic diastolic heart failure ([place]) 09/21/2013  . CVA (cerebral infarction) 08/08/2013  . Dizziness 08/07/2013  . TIA (transient ischemic attack) 08/07/2013  . Atrial fibrillation ([place]) 08/07/2013  . Hypertension 08/07/2013  . Anemia 08/07/2013  . Dyspnea 06/21/2013  . Chest pain 11/16/2012  . Atrial fibrillation with RVR ([place])  11/16/2012  . Gout 11/16/2012  . Diabetes mellitus type 2, uncontrolled, with complications ([place]) 92/33/0076  . CKD (chronic kidney disease), stage II 11/16/2012  . Atypical atrial flutter ([place]) 11/16/2012  . Pleuritic chest pain 11/16/2012  . Morbid obesity ([place]) 11/16/2012  . Obstructive sleep apnea 11/16/2012   Past Medical History:  Diagnosis Date  . Anal fissure    h/o - no recent complications  . Arthritis    hips  . Atrial fibrillation ([place])   . Depression   . Diabetes mellitus without complication ([place])    improved after diet/exercise  . Enlarged prostate   . GERD (gastroesophageal reflux disease)   . Gout   . H/O hiatal hernia   . History of kidney stones   . Hx of typhoid fever    as a child  . Hypertension    improved after diet/exercise  . Memory change 04/14/2017  . OSA (obstructive sleep apnea)    wears cpap  . Pneumonia   . PONV (postoperative nausea and vomiting)    also difficulty waking up  . Stroke [facility]) 08/2013   short term memory loss (slight)    Family History  Problem Relation Age of Onset  . COPD Father   . Heart attack Sister        died age 68 of MI    Past Surgical History:  Procedure Laterality Date  . AMPUTATION Left 10/02/2017   Procedure: LEFT FOOT 1ST RAY AMPUTATION;  Surgeon: Newt Minion, MD;  Location: [facility];  Service: Orthopedics;  Laterality: Left;  . AMPUTATION Left 01/15/2018   Procedure: LEFT FOOT 2ND TOE AMPUTATION;  Surgeon: Newt Minion, MD;  Location: [facility];  Service: Orthopedics;  Laterality: Left;  . carpel tunnel Bilateral   . CATARACT EXTRACTION W/ INTRAOCULAR LENS  IMPLANT, BILATERAL    . CHOLECYSTECTOMY    . COLONOSCOPY    . EYE SURGERY     laser surgery   . I&D EXTREMITY Left 09/15/2014   Procedure: Excision Necrotic Left Achilles, Skin Graft, apply Wound VAC;  Surgeon: Newt Minion, MD;  Location: [facility];  Service: Orthopedics;  Laterality: Left;  . KIDNEY STONE SURGERY    . KNEE ARTHROSCOPY Bilateral   .  LIGAMENT REPAIR    . TONSILLECTOMY    . VASECTOMY     Social History   Occupational History  . Occupation: Retired  Tobacco Use  . Smoking status: Never Smoker  . Smokeless tobacco: Never Used  Substance and Sexual Activity  . Alcohol use: Yes    Alcohol/week: 0.0 standard drinks    Comment: rare - 1-2x /yr  . Drug use: No  . Sexual activity: Not on file

## 2018-04-15 ENCOUNTER — Ambulatory Visit (INDEPENDENT_AMBULATORY_CARE_PROVIDER_SITE_OTHER): Payer: Medicare Other | Admitting: Neurology

## 2018-04-15 ENCOUNTER — Encounter: Payer: Self-pay | Admitting: Neurology

## 2018-04-15 ENCOUNTER — Encounter

## 2018-04-15 VITALS — BP 138/60 | HR 86 | Ht 69.0 in | Wt 287.5 lb

## 2018-04-15 DIAGNOSIS — R413 Other amnesia: Secondary | ICD-10-CM | POA: Diagnosis not present

## 2018-04-15 MED ORDER — DONEPEZIL HCL 5 MG PO TABS: 5.0000 mg | ORAL_TABLET | Freq: Every day | ORAL | 1 refills | Status: DC

## 2018-04-15 NOTE — Progress Notes (Signed)
Reason for visit: Memory disturbance  Oscar [name]. is an 72 y.o. male  History of present illness:  Oscar [name] is a 72 year old right-handed white male with a history of diabetes and obesity.  The patient has had recent issues with osteomyelitis affecting the left foot, he required amputation of the great toe and second toe, he has recovered from this.  The patient has come in with his wife who indicates that the patient has continued to have a slow gradual change in his memory since last seen 1 year ago.  The patient has had MRI of the brain that did not show any significant change from 2014, he has not been on any medications for memory.  He does report some mild tremor off and on with his upper extremities.  He does have occasional slightly low blood sugars to around 60, but never much lower than this.  He returns for an evaluation.  Past Medical History:  Diagnosis Date  . Anal fissure    h/o - no recent complications  . Arthritis    hips  . Atrial fibrillation ([place])   . Depression   . Diabetes mellitus without complication ([place])    improved after diet/exercise  . Enlarged prostate   . GERD (gastroesophageal reflux disease)   . Gout   . H/O hiatal hernia   . History of kidney stones   . Hx of typhoid fever    as a child  . Hypertension    improved after diet/exercise  . Memory change 04/14/2017  . OSA (obstructive sleep apnea)    wears cpap  . Pneumonia   . PONV (postoperative nausea and vomiting)    also difficulty waking up  . Stroke [facility]) 08/2013   short term memory loss (slight)    Past Surgical History:  Procedure Laterality Date  . AMPUTATION Left 10/02/2017   Procedure: LEFT FOOT 1ST RAY AMPUTATION;  Surgeon: Newt Minion, MD;  Location: [place];  Service: Orthopedics;  Laterality: Left;  . AMPUTATION Left 01/15/2018   Procedure: LEFT FOOT 2ND TOE AMPUTATION;  Surgeon: Newt Minion, MD;  Location: [place];  Service: Orthopedics;  Laterality: Left;  . carpel  tunnel Bilateral   . CATARACT EXTRACTION W/ INTRAOCULAR LENS  IMPLANT, BILATERAL    . CHOLECYSTECTOMY    . COLONOSCOPY    . EYE SURGERY     laser surgery   . I&D EXTREMITY Left 09/15/2014   Procedure: Excision Necrotic Left Achilles, Skin Graft, apply Wound VAC;  Surgeon: Newt Minion, MD;  Location: [place];  Service: Orthopedics;  Laterality: Left;  . KIDNEY STONE SURGERY    . KNEE ARTHROSCOPY Bilateral   . LIGAMENT REPAIR    . TONSILLECTOMY    . VASECTOMY      Family History  Problem Relation Age of Onset  . COPD Father   . Heart attack Sister        died age 82 of MI    Social history:  reports that he has never smoked. He has never used smokeless tobacco. He reports that he drinks alcohol. He reports that he does not use drugs.    Allergies  Allergen Reactions  . Iohexol Hives and Other (See Comments)     Desc: HIVES S/P 13HR.PREMEDS, ?LOW DOSAGE PREMEDS   . Ivp Dye [Iodinated Diagnostic Agents] Hives and Other (See Comments)    welps     Medications:  Prior to Admission medications   Medication Sig  Start Date End Date Taking? Authorizing Provider  acetaminophen (TYLENOL) 500 MG tablet Take 1,000 mg by mouth 2 (two) times daily as needed for moderate pain.    Yes [provider]  allopurinol (ZYLOPRIM) 300 MG tablet Take 300 mg by mouth daily. 12/11/17  Yes [provider]  apixaban (ELIQUIS) 5 MG TABS tablet TAKE 1 TABLET (5 MG TOTAL) BY MOUTH 2 (TWO) TIMES DAILY. 11/03/17  Yes Bensimhon, Shaune Pascal, MD  B-D INS SYRINGE 0.5CC/30GX1/2" 30G X 1/2" 0.5 ML MISC USE THREE TIMES A DAY DX-E11.65 INJECTION 04/01/17  Yes [provider]  blood glucose meter kit and supplies KIT Dispense based on patient and insurance preference. Use up to four times daily as directed. (FOR ICD-9 250.00, 250.01). 09/03/14  Yes Theodis Blaze, MD  buPROPion [facility] SR) 150 MG 12 hr tablet Take 150 mg by mouth daily. 09/12/17  Yes [provider]  diltiazem (CARDIZEM  CD) 360 MG 24 hr capsule Take 1 capsule (360 mg total) by mouth daily. 02/20/14  Yes Evans Lance, MD  doxycycline (VIBRA-TABS) 100 MG tablet Take 1 tablet (100 mg total) by mouth 2 (two) times daily. 02/18/18  Yes Dondra Prader R, NP  escitalopram (LEXAPRO) 20 MG tablet Take 40 mg by mouth daily. 01/05/18  Yes [provider]  fexofenadine (ALLEGRA) 180 MG tablet Take 180 mg by mouth daily as needed for allergies or rhinitis.   Yes [provider]  furosemide (LASIX) 80 MG tablet Take 1 tablet (80 mg total) by mouth daily. T 01/28/16  Yes Bensimhon, Shaune Pascal, MD  indomethacin (INDOCIN) 50 MG capsule Take 1 capsule (50 mg total) by mouth 3 (three) times daily as needed (for gout flare ups). Reported on 12/27/2015 05/11/17  Yes Dondra Prader R, NP  insulin aspart (NOVOLOG) 100 UNIT/ML injection Inject 50 Units into the skin 3 (three) times daily as needed for high blood sugar.    Yes [provider]  LEVEMIR 100 UNIT/ML injection Inject 50 Units into the skin at bedtime.  05/16/15  Yes [provider]  metFORMIN (GLUCOPHAGE-XR) 500 MG 24 hr tablet Take 500 mg by mouth 2 (two) times daily.   Yes [provider]  metolazone (ZAROXOLYN) 2.5 MG tablet Take 1 tablet every Wednesday. 11/07/16  Yes Bensimhon, Shaune Pascal, MD  pentoxifylline (TRENTAL) 400 MG CR tablet TAKE 1 TABLET BY MOUTH 3 TIMES DAILY WITH MEALS. 02/05/18  Yes Newt Minion, MD  potassium chloride SA (K-DUR,KLOR-CON) 20 MEQ tablet Take 2 tablets (40 mEq total) by mouth daily. Patient taking differently: Take 20 mEq by mouth 2 (two) times daily.  08/25/16  Yes Bensimhon, Shaune Pascal, MD  simvastatin (ZOCOR) 20 MG tablet Take 20 mg by mouth every evening. 01/07/18  Yes [provider]  tamsulosin (FLOMAX) 0.4 MG CAPS capsule Take 1 capsule (0.4 mg total) by mouth daily after breakfast. Patient taking differently: Take 0.4 mg by mouth 2 (two) times daily.  08/08/13  Yes Josetta Huddle, MD     ROS:  Out of a complete 14 system review of symptoms, the patient complains only of the following symptoms, and all other reviewed systems are negative.  Memory problems  Blood pressure 138/60, pulse 86, height '5\' 9"'$  (1.753 m), weight 287 lb 8 oz (130.4 kg), SpO2 98 %.  Physical Exam  General: The patient is alert and cooperative at the time of the examination.  The patient is markedly obese.  Skin: 1+ edema at the ankles is  seen bilaterally.   Neurologic Exam  Mental status: The patient is alert and oriented x 3 at the time of the examination. The patient has apparent normal recent and remote memory, with an apparently normal attention span and concentration ability.  Mini-Mental status examination done today shows a total score of 28/30.   Cranial nerves: Facial symmetry is present. Speech is normal, no aphasia or dysarthria is noted. Extraocular movements are full. Visual fields are full.  Motor: The patient has good strength in all 4 extremities.  Sensory examination: Soft touch sensation is symmetric on the face, arms, and legs.  Coordination: The patient has good finger-nose-finger and heel-to-shin bilaterally.  Gait and station: The patient has a slightly wide-based gait.  Romberg is negative. No drift is seen.  Reflexes: Deep tendon reflexes are symmetric, but are depressed.   MRI brain 04/29/17:  IMPRESSION:  Abnormal MRI brain (without) demonstrating: 1. Moderate perisylvian and mesial temporal atrophy. 2. Mild periventricular and subcortical chronic small vessel ischemic disease. Chronic left cerebellar ischemic infarctions. 3. No acute findings. 4. No significant change from MRI on 08/07/13.   Assessment/Plan:  1.  Mild memory disturbance  2.  History of cerebrovascular disease  The patient according to the wife has had some gradual change in memory.  We will add Aricept at this time.  He will call in 1 month if he tolerates the 5 mg dose and we will  convert him to the 10 mg maintenance dose.  He will follow-up in 9 months.  Jill Alexanders MD 04/15/2018 10:27 AM  [facility] [address] [place],  [address]  Phone [phone] Fax [phone]

## 2018-04-15 NOTE — Patient Instructions (Signed)
We will start Aricept.   Begin Aricept (donepezil) at 5 mg at night for one month. If this medication is well-tolerated, please call our office and we will call in a prescription for the 10 mg tablets. Look out for side effects that may include nausea, diarrhea, weight loss, or stomach cramps. This medication will also cause a runny nose, therefore there is no need for allergy medications for this purpose.

## 2018-04-21 ENCOUNTER — Ambulatory Visit (INDEPENDENT_AMBULATORY_CARE_PROVIDER_SITE_OTHER): Payer: Medicare Other

## 2018-04-21 ENCOUNTER — Ambulatory Visit (INDEPENDENT_AMBULATORY_CARE_PROVIDER_SITE_OTHER): Payer: Medicare Other | Admitting: Orthopedic Surgery

## 2018-04-21 ENCOUNTER — Encounter (INDEPENDENT_AMBULATORY_CARE_PROVIDER_SITE_OTHER): Payer: Self-pay | Admitting: Orthopedic Surgery

## 2018-04-21 VITALS — Ht 69.0 in | Wt 287.0 lb

## 2018-04-21 DIAGNOSIS — E118 Type 2 diabetes mellitus with unspecified complications: Secondary | ICD-10-CM

## 2018-04-21 DIAGNOSIS — I739 Peripheral vascular disease, unspecified: Secondary | ICD-10-CM | POA: Diagnosis not present

## 2018-04-21 DIAGNOSIS — IMO0002 Reserved for concepts with insufficient information to code with codable children: Secondary | ICD-10-CM

## 2018-04-21 DIAGNOSIS — L97521 Non-pressure chronic ulcer of other part of left foot limited to breakdown of skin: Secondary | ICD-10-CM | POA: Diagnosis not present

## 2018-04-21 DIAGNOSIS — M79672 Pain in left foot: Secondary | ICD-10-CM

## 2018-04-21 DIAGNOSIS — E1165 Type 2 diabetes mellitus with hyperglycemia: Secondary | ICD-10-CM

## 2018-04-21 NOTE — Progress Notes (Signed)
Office Visit Note   Patient: Oscar [name].           Date of Birth: September 08, 1945           MRN: [id number] Visit Date: 04/21/2018              Requested by: Josetta Huddle, MD [address] [place], [place] [address] PCP: Josetta Huddle, MD  Chief Complaint  Patient presents with  . Left Foot - Wound Check      HPI: Patient is a 72 year old gentleman who presents complaining of ischemic ulcer lateral border left foot fifth metatarsal.  Patient currently uses nitroglycerin patch and trying to help to improve his microcirculation.  Patient is concerned that the wound is not getting better despite using Iodosorb and is concerned that it is more painful.  Assessment & Plan: Visit Diagnoses:  1. Pain in left foot   2. Diabetes mellitus type 2, uncontrolled, with complications ([place])   3. Ulcer of left foot, limited to breakdown of skin ([place])   4. PVD (peripheral vascular disease) ([place])     Plan: Discussed with the patient that this is an ischemic ulcer.  Recommended patient add exercise to his routine either a recumbent bicycle or stationary bicycle to try to improve the circulation to the lower extremities.  Discussed that there are not any other medical options other than the nitroglycerin and Trileptal.  Discussed that considering 1/5 ray amputation would have an extremely hard time healing.  Follow-Up Instructions: Return in about 4 weeks (around 05/19/2018).   Ortho Exam  Patient is alert, oriented, no adenopathy, well-dressed, normal affect, normal respiratory effort. Examination patient does have a palpable dorsalis pedis pulse this is thready consistent with the calcified vessels.  He has ischemic ulcer over the fifth metatarsal head this is 10 mm in diameter and 3 mm deep.  There is no exposed bone or tendon the wound edges are ischemic.  There is no cellulitis no drainage no odor.  Patient is ambulating in a wheelchair.  Imaging: Xr Foot Complete  Left  Result Date: 04/21/2018 3 view radiographs of the left foot shows no destructive bony changes.  Patient has severe peripheral vascular disease with calcification of all arteries extending out to the toes.  No images are attached to the encounter.  Labs: Lab Results  Component Value Date   HGBA1C 10.1 (H) 09/08/2016   HGBA1C 9.1 (H) 08/28/2014   HGBA1C 10.1 (H) 08/07/2013   ESRSEDRATE 75 (H) 08/28/2014   ESRSEDRATE 42 (H) 11/17/2012   CRP 12.9 (H) 08/28/2014   LABURIC 3.0 (L) 08/31/2014   REPTSTATUS 10/03/2017 FINAL 09/30/2017   GRAMSTAIN  09/30/2017    RARE WBC PRESENT, PREDOMINANTLY MONONUCLEAR NO ORGANISMS SEEN    CULT  09/30/2017    NO GROWTH 2 DAYS Performed at [facility], [place] [address], [place], [place] [address]      Lab Results  Component Value Date   ALBUMIN 3.3 (L) 09/07/2016   ALBUMIN 3.6 09/15/2014   ALBUMIN 3.1 (L) 08/29/2014   LABURIC 3.0 (L) 08/31/2014    Body mass index is 42.38 kg/m.  Orders:  Orders Placed This Encounter  Procedures  . XR Foot Complete Left   No orders of the defined types were placed in this encounter.    Procedures: No procedures performed  Clinical Data: No additional findings.  ROS:  All other systems negative, except as noted in the HPI. Review of Systems  Objective: Vital  Signs: Ht 5\' 9"  (1.753 m)   Wt 287 lb (130.2 kg)   BMI 42.38 kg/m   Specialty Comments:  No specialty comments available.  PMFS History: Patient Active Problem List   Diagnosis Date Noted  . Pain in left foot 04/21/2018  . PVD (peripheral vascular disease) ([place]) 04/21/2018  . Amputated toe, left ([place]) 01/21/2018  . Achilles tendon contracture, left 12/28/2017  . Dehiscence of amputation stump ([place]) 11/10/2017  . Great toe amputation status, left ([place]) 11/10/2017  . Memory change 04/14/2017  . Cutaneous abscess of left foot 04/09/2017  . Non-pressure chronic ulcer of right heel and midfoot limited to breakdown of skin  ([place]) 10/07/2016  . Influenza A   . AKI (acute kidney injury) ([place]) 09/07/2016  . Acute encephalopathy 09/07/2016  . Acute respiratory failure with hypoxia ([place]) 09/07/2016  . Elevated lactic acid level 09/07/2016  . Elevated troponin 09/07/2016  . Coronary artery calcification seen on CT scan 10/28/2015  . Acute on chronic diastolic (congestive) heart failure ([place]) 09/26/2015  . OSA (obstructive sleep apnea) 02/07/2015  . Sepsis ([place]) 08/28/2014  . Chronic diastolic heart failure ([place]) 09/21/2013  . CVA (cerebral infarction) 08/08/2013  . Dizziness 08/07/2013  . TIA (transient ischemic attack) 08/07/2013  . Atrial fibrillation ([place]) 08/07/2013  . Hypertension 08/07/2013  . Anemia 08/07/2013  . Dyspnea 06/21/2013  . Chest pain 11/16/2012  . Atrial fibrillation with RVR ([place]) 11/16/2012  . Gout 11/16/2012  . Diabetes mellitus type 2, uncontrolled, with complications ([place]) 29/92/4268  . CKD (chronic kidney disease), stage II 11/16/2012  . Atypical atrial flutter ([place]) 11/16/2012  . Pleuritic chest pain 11/16/2012  . Morbid obesity ([place]) 11/16/2012  . Obstructive sleep apnea 11/16/2012   Past Medical History:  Diagnosis Date  . Anal fissure    h/o - no recent complications  . Arthritis    hips  . Atrial fibrillation ([place])   . Depression   . Diabetes mellitus without complication ([place])    improved after diet/exercise  . Enlarged prostate   . GERD (gastroesophageal reflux disease)   . Gout   . H/O hiatal hernia   . History of kidney stones   . Hx of typhoid fever    as a child  . Hypertension    improved after diet/exercise  . Memory change 04/14/2017  . OSA (obstructive sleep apnea)    wears cpap  . Pneumonia   . PONV (postoperative nausea and vomiting)    also difficulty waking up  . Stroke [facility]) 08/2013   short term memory loss (slight)    Family History  Problem Relation Age of Onset  . COPD Father   . Heart attack Sister        died age 59 of MI    Past  Surgical History:  Procedure Laterality Date  . AMPUTATION Left 10/02/2017   Procedure: LEFT FOOT 1ST RAY AMPUTATION;  Surgeon: Newt Minion, MD;  Location: [place];  Service: Orthopedics;  Laterality: Left;  . AMPUTATION Left 01/15/2018   Procedure: LEFT FOOT 2ND TOE AMPUTATION;  Surgeon: Newt Minion, MD;  Location: [place];  Service: Orthopedics;  Laterality: Left;  . carpel tunnel Bilateral   . CATARACT EXTRACTION W/ INTRAOCULAR LENS  IMPLANT, BILATERAL    . CHOLECYSTECTOMY    . COLONOSCOPY    . EYE SURGERY     laser surgery   . I&D EXTREMITY Left 09/15/2014   Procedure: Excision Necrotic Left Achilles, Skin Graft, apply Wound VAC;  Surgeon:  Newt Minion, MD;  Location: [place];  Service: Orthopedics;  Laterality: Left;  . KIDNEY STONE SURGERY    . KNEE ARTHROSCOPY Bilateral   . LIGAMENT REPAIR    . TONSILLECTOMY    . VASECTOMY     Social History   Occupational History  . Occupation: Retired  Tobacco Use  . Smoking status: Never Smoker  . Smokeless tobacco: Never Used  Substance and Sexual Activity  . Alcohol use: Yes    Alcohol/week: 0.0 standard drinks    Comment: rare - 1-2x /yr  . Drug use: No  . Sexual activity: Not on file

## 2018-05-03 ENCOUNTER — Other Ambulatory Visit (HOSPITAL_COMMUNITY): Payer: Self-pay | Admitting: Internal Medicine

## 2018-05-13 ENCOUNTER — Encounter: Payer: Self-pay | Admitting: *Deleted

## 2018-05-13 ENCOUNTER — Other Ambulatory Visit: Payer: Self-pay | Admitting: Neurology

## 2018-05-13 MED ORDER — DONEPEZIL HCL 10 MG PO TABS: 10.0000 mg | ORAL_TABLET | Freq: Every day | ORAL | 5 refills | Status: DC

## 2018-05-13 NOTE — Telephone Encounter (Signed)
I called and spoke with wife. Her husband has tolerated Aricept 5mg  tablet, no SE. Agreeable to increase dose to 10mg  tablet as discussed at last visit with Dr. Jannifer Franklin. Advised we will send in new rx for this dose. She verbalized understanding.

## 2018-05-17 ENCOUNTER — Ambulatory Visit (INDEPENDENT_AMBULATORY_CARE_PROVIDER_SITE_OTHER): Payer: Medicare Other | Admitting: Orthopedic Surgery

## 2018-05-19 ENCOUNTER — Ambulatory Visit (INDEPENDENT_AMBULATORY_CARE_PROVIDER_SITE_OTHER): Payer: Medicare Other | Admitting: Orthopedic Surgery

## 2018-05-19 ENCOUNTER — Encounter (INDEPENDENT_AMBULATORY_CARE_PROVIDER_SITE_OTHER): Payer: Self-pay | Admitting: Orthopedic Surgery

## 2018-05-19 VITALS — Ht 69.0 in | Wt 287.0 lb

## 2018-05-19 DIAGNOSIS — I739 Peripheral vascular disease, unspecified: Secondary | ICD-10-CM

## 2018-05-19 DIAGNOSIS — L97521 Non-pressure chronic ulcer of other part of left foot limited to breakdown of skin: Secondary | ICD-10-CM | POA: Diagnosis not present

## 2018-05-19 DIAGNOSIS — Z6841 Body Mass Index (BMI) 40.0 and over, adult: Secondary | ICD-10-CM | POA: Diagnosis not present

## 2018-05-19 DIAGNOSIS — S98132A Complete traumatic amputation of one left lesser toe, initial encounter: Secondary | ICD-10-CM | POA: Diagnosis not present

## 2018-05-19 DIAGNOSIS — E1165 Type 2 diabetes mellitus with hyperglycemia: Secondary | ICD-10-CM | POA: Diagnosis not present

## 2018-05-19 DIAGNOSIS — E118 Type 2 diabetes mellitus with unspecified complications: Secondary | ICD-10-CM

## 2018-05-19 DIAGNOSIS — IMO0002 Reserved for concepts with insufficient information to code with codable children: Secondary | ICD-10-CM

## 2018-05-19 NOTE — Progress Notes (Signed)
Office Visit Note   Patient: Oscar [name].           Date of Birth: 1945/09/14           MRN: [id number] Visit Date: 05/19/2018              Requested by: Josetta Huddle, MD [address] [place], [place] [address] PCP: Josetta Huddle, MD  Chief Complaint  Patient presents with  . Left Foot - Wound Check      HPI: Patient is a 72 year old gentleman who presents in follow-up status post first ray amputation left foot slow wound healing with using a nitroglycerin patch trental as well as compression stockings.  Patient does have a small Waggoner grade 1 ulcer of the fifth metatarsal head of the left foot.  Patient has new Hoka sneakers.  Assessment & Plan: Visit Diagnoses:  1. Diabetes mellitus type 2, uncontrolled, with complications ([place])   2. Ulcer of left foot, limited to breakdown of skin ([place])   3. PVD (peripheral vascular disease) ([place])   4. Amputated toe, left ([place])   5. Body mass index 40.0-44.9, adult ([place])   6. Morbid obesity ([place])     Plan: Recommended continue with the nitroglycerin patch and the Trental continue with compression stocking continue with a Hoka shoes.  Follow-Up Instructions: Return in about 4 weeks (around 06/16/2018).   Ortho Exam  Patient is alert, oriented, no adenopathy, well-dressed, normal affect, normal respiratory effort. Examination patient shows continued interval healing.  The first ray amputation site has completely healed there is no redness no cellulitis no drainage no signs of infection.  The fifth metatarsal head ulcer is slowly improving as well.  After informed consent a 10 blade knife was used to debride the skin and soft tissue back to healthy bleeding viable tissue this does not probe to bone or tendon.  Silver nitrate was used for hemostasis the ulcer is 10 mm in diameter 1 mm deep.  Imaging: No results found. No images are attached to the encounter.  Labs: Lab Results  Component Value Date   HGBA1C 10.1  (H) 09/08/2016   HGBA1C 9.1 (H) 08/28/2014   HGBA1C 10.1 (H) 08/07/2013   ESRSEDRATE 75 (H) 08/28/2014   ESRSEDRATE 42 (H) 11/17/2012   CRP 12.9 (H) 08/28/2014   LABURIC 3.0 (L) 08/31/2014   REPTSTATUS 10/03/2017 FINAL 09/30/2017   GRAMSTAIN  09/30/2017    RARE WBC PRESENT, PREDOMINANTLY MONONUCLEAR NO ORGANISMS SEEN    CULT  09/30/2017    NO GROWTH 2 DAYS Performed at [facility], [place] [address] [place], [place] [address]      Lab Results  Component Value Date   ALBUMIN 3.3 (L) 09/07/2016   ALBUMIN 3.6 09/15/2014   ALBUMIN 3.1 (L) 08/29/2014   LABURIC 3.0 (L) 08/31/2014    Body mass index is 42.38 kg/m.  Orders:  No orders of the defined types were placed in this encounter.  No orders of the defined types were placed in this encounter.    Procedures: No procedures performed  Clinical Data: No additional findings.  ROS:  All other systems negative, except as noted in the HPI. Review of Systems  Objective: Vital Signs: Ht 5\' 9"  (1.753 m)   Wt 287 lb (130.2 kg)   BMI 42.38 kg/m   Specialty Comments:  No specialty comments available.  PMFS History: Patient Active Problem List   Diagnosis Date Noted  . Pain in left foot 04/21/2018  .  PVD (peripheral vascular disease) ([place]) 04/21/2018  . Amputated toe, left ([place]) 01/21/2018  . Achilles tendon contracture, left 12/28/2017  . Dehiscence of amputation stump ([place]) 11/10/2017  . Great toe amputation status, left 11/10/2017  . Memory change 04/14/2017  . Cutaneous abscess of left foot 04/09/2017  . Non-pressure chronic ulcer of right heel and midfoot limited to breakdown of skin ([place]) 10/07/2016  . Influenza A   . AKI (acute kidney injury) ([place]) 09/07/2016  . Acute encephalopathy 09/07/2016  . Acute respiratory failure with hypoxia ([place]) 09/07/2016  . Elevated lactic acid level 09/07/2016  . Elevated troponin 09/07/2016  . Coronary artery calcification seen on CT scan 10/28/2015  . Acute on  chronic diastolic (congestive) heart failure ([place]) 09/26/2015  . OSA (obstructive sleep apnea) 02/07/2015  . Sepsis ([place]) 08/28/2014  . Chronic diastolic heart failure ([place]) 09/21/2013  . CVA (cerebral infarction) 08/08/2013  . Dizziness 08/07/2013  . TIA (transient ischemic attack) 08/07/2013  . Atrial fibrillation ([place]) 08/07/2013  . Hypertension 08/07/2013  . Anemia 08/07/2013  . Dyspnea 06/21/2013  . Chest pain 11/16/2012  . Atrial fibrillation with RVR ([place]) 11/16/2012  . Gout 11/16/2012  . Diabetes mellitus type 2, uncontrolled, with complications ([place]) 16/05/9603  . CKD (chronic kidney disease), stage II 11/16/2012  . Atypical atrial flutter ([place]) 11/16/2012  . Pleuritic chest pain 11/16/2012  . Morbid obesity ([place]) 11/16/2012  . Obstructive sleep apnea 11/16/2012   Past Medical History:  Diagnosis Date  . Anal fissure    h/o - no recent complications  . Arthritis    hips  . Atrial fibrillation ([place])   . Depression   . Diabetes mellitus without complication ([place])    improved after diet/exercise  . Enlarged prostate   . GERD (gastroesophageal reflux disease)   . Gout   . H/O hiatal hernia   . History of kidney stones   . Hx of typhoid fever    as a child  . Hypertension    improved after diet/exercise  . Memory change 04/14/2017  . OSA (obstructive sleep apnea)    wears cpap  . Pneumonia   . PONV (postoperative nausea and vomiting)    also difficulty waking up  . Stroke [facility]) 08/2013   short term memory loss (slight)    Family History  Problem Relation Age of Onset  . COPD Father   . Heart attack Sister        died age 36 of MI    Past Surgical History:  Procedure Laterality Date  . AMPUTATION Left 10/02/2017   Procedure: LEFT FOOT 1ST RAY AMPUTATION;  Surgeon: Newt Minion, MD;  Location: [place];  Service: Orthopedics;  Laterality: Left;  . AMPUTATION Left 01/15/2018   Procedure: LEFT FOOT 2ND TOE AMPUTATION;  Surgeon: Newt Minion, MD;  Location: [place];  Service: Orthopedics;  Laterality: Left;  . carpel tunnel Bilateral   . CATARACT EXTRACTION W/ INTRAOCULAR LENS  IMPLANT, BILATERAL    . CHOLECYSTECTOMY    . COLONOSCOPY    . EYE SURGERY     laser surgery   . I&D EXTREMITY Left 09/15/2014   Procedure: Excision Necrotic Left Achilles, Skin Graft, apply Wound VAC;  Surgeon: Newt Minion, MD;  Location: [place];  Service: Orthopedics;  Laterality: Left;  . KIDNEY STONE SURGERY    . KNEE ARTHROSCOPY Bilateral   . LIGAMENT REPAIR    . TONSILLECTOMY    . VASECTOMY     Social History  Occupational History  . Occupation: Retired  Tobacco Use  . Smoking status: Never Smoker  . Smokeless tobacco: Never Used  Substance and Sexual Activity  . Alcohol use: Yes    Alcohol/week: 0.0 standard drinks    Comment: rare - 1-2x /yr  . Drug use: No  . Sexual activity: Not on file

## 2018-06-15 ENCOUNTER — Encounter (INDEPENDENT_AMBULATORY_CARE_PROVIDER_SITE_OTHER): Payer: Self-pay | Admitting: Orthopedic Surgery

## 2018-06-15 ENCOUNTER — Ambulatory Visit (INDEPENDENT_AMBULATORY_CARE_PROVIDER_SITE_OTHER): Payer: Medicare Other | Admitting: Physician Assistant

## 2018-06-15 VITALS — Ht 69.0 in | Wt 287.0 lb

## 2018-06-15 DIAGNOSIS — Z89422 Acquired absence of other left toe(s): Secondary | ICD-10-CM

## 2018-06-15 DIAGNOSIS — L97521 Non-pressure chronic ulcer of other part of left foot limited to breakdown of skin: Secondary | ICD-10-CM

## 2018-06-15 DIAGNOSIS — E1142 Type 2 diabetes mellitus with diabetic polyneuropathy: Secondary | ICD-10-CM

## 2018-06-16 ENCOUNTER — Encounter (INDEPENDENT_AMBULATORY_CARE_PROVIDER_SITE_OTHER): Payer: Self-pay | Admitting: Physician Assistant

## 2018-06-16 NOTE — Progress Notes (Signed)
Office Visit Note   Patient: Oscar [name].           Date of Birth: 1945-10-10           MRN: [id number] Visit Date: 06/15/2018              Requested by: Josetta Huddle, MD [address] [place], [place] [address] PCP: Josetta Huddle, MD  Chief Complaint  Patient presents with  . Spine - Follow-up    MRI L-Spine Review      HPI: The patient is a 72 yo male who is seen for follow up following left foot second ray amputation 01/21/18. He had previously undergone amputation of the left 1st ray in 09/2017. The patient has been wearing his vive silver compression socks. He reports a small callus over the left lateral foot. He is using a NTG patch and trental and is fully anticoagulated on Eliquis. He reports no pain from the area.   Assessment & Plan: Visit Diagnoses:  1. History of complete ray amputation of second toe of left foot ([place])   2. Ulcer of left foot, limited to breakdown of skin ([place])   3. Type 2 diabetes mellitus with diabetic polyneuropathy, unspecified whether long term insulin use ([place])     Plan: After informed consent, the left lateral foot callus was debrided with a #10 blade knife and the patient tolerated this well. Continue vive silver compression socks. Follow up in 4 weeks.   Follow-Up Instructions: Return in about 4 weeks (around 07/13/2018).   Ortho Exam  Patient is alert, oriented, no adenopathy, well-dressed, normal affect, normal respiratory effort. After informed consent the left lateral foot callus was debrided with a #10 blade knife and the patient tolerated the procedure well. No signs of infection , no cellulitis.  1st and 2nd ray amputation sites are healing well.    Imaging: No results found. No images are attached to the encounter.  Labs: Lab Results  Component Value Date   HGBA1C 10.1 (H) 09/08/2016   HGBA1C 9.1 (H) 08/28/2014   HGBA1C 10.1 (H) 08/07/2013   ESRSEDRATE 75 (H) 08/28/2014   ESRSEDRATE 42 (H) 11/17/2012   CRP 12.9 (H) 08/28/2014   LABURIC 3.0 (L) 08/31/2014   REPTSTATUS 10/03/2017 FINAL 09/30/2017   GRAMSTAIN  09/30/2017    RARE WBC PRESENT, PREDOMINANTLY MONONUCLEAR NO ORGANISMS SEEN    CULT  09/30/2017    NO GROWTH 2 DAYS Performed at [facility], [place] [address] [place], [place] [address]      Lab Results  Component Value Date   ALBUMIN 3.3 (L) 09/07/2016   ALBUMIN 3.6 09/15/2014   ALBUMIN 3.1 (L) 08/29/2014   LABURIC 3.0 (L) 08/31/2014    Body mass index is 42.38 kg/m.  Orders:  No orders of the defined types were placed in this encounter.  No orders of the defined types were placed in this encounter.    Procedures: No procedures performed  Clinical Data: No additional findings.  ROS:  All other systems negative, except as noted in the HPI. Review of Systems  Objective: Vital Signs: Ht 5\' 9"  (1.753 m)   Wt 287 lb (130.2 kg)   BMI 42.38 kg/m   Specialty Comments:  No specialty comments available.  PMFS History: Patient Active Problem List   Diagnosis Date Noted  . Pain in left foot 04/21/2018  . PVD (peripheral vascular disease) ([place]) 04/21/2018  . Amputated toe, left ([place]) 01/21/2018  . Achilles tendon  contracture, left 12/28/2017  . Dehiscence of amputation stump ([place]) 11/10/2017  . Great toe amputation status, left 11/10/2017  . Memory change 04/14/2017  . Cutaneous abscess of left foot 04/09/2017  . Non-pressure chronic ulcer of right heel and midfoot limited to breakdown of skin ([place]) 10/07/2016  . Influenza A   . AKI (acute kidney injury) ([place]) 09/07/2016  . Acute encephalopathy 09/07/2016  . Acute respiratory failure with hypoxia ([place]) 09/07/2016  . Elevated lactic acid level 09/07/2016  . Elevated troponin 09/07/2016  . Coronary artery calcification seen on CT scan 10/28/2015  . Acute on chronic diastolic (congestive) heart failure ([place]) 09/26/2015  . OSA (obstructive sleep apnea) 02/07/2015  . Sepsis ([place]) 08/28/2014  .  Chronic diastolic heart failure ([place]) 09/21/2013  . CVA (cerebral infarction) 08/08/2013  . Dizziness 08/07/2013  . TIA (transient ischemic attack) 08/07/2013  . Atrial fibrillation ([place]) 08/07/2013  . Hypertension 08/07/2013  . Anemia 08/07/2013  . Dyspnea 06/21/2013  . Chest pain 11/16/2012  . Atrial fibrillation with RVR ([place]) 11/16/2012  . Gout 11/16/2012  . Diabetes mellitus type 2, uncontrolled, with complications ([place]) 32/99/2426  . CKD (chronic kidney disease), stage II 11/16/2012  . Atypical atrial flutter ([place]) 11/16/2012  . Pleuritic chest pain 11/16/2012  . Morbid obesity ([place]) 11/16/2012  . Obstructive sleep apnea 11/16/2012   Past Medical History:  Diagnosis Date  . Anal fissure    h/o - no recent complications  . Arthritis    hips  . Atrial fibrillation ([place])   . Depression   . Diabetes mellitus without complication ([place])    improved after diet/exercise  . Enlarged prostate   . GERD (gastroesophageal reflux disease)   . Gout   . H/O hiatal hernia   . History of kidney stones   . Hx of typhoid fever    as a child  . Hypertension    improved after diet/exercise  . Memory change 04/14/2017  . OSA (obstructive sleep apnea)    wears cpap  . Pneumonia   . PONV (postoperative nausea and vomiting)    also difficulty waking up  . Stroke [facility]) 08/2013   short term memory loss (slight)    Family History  Problem Relation Age of Onset  . COPD Father   . Heart attack Sister        died age 44 of MI    Past Surgical History:  Procedure Laterality Date  . AMPUTATION Left 10/02/2017   Procedure: LEFT FOOT 1ST RAY AMPUTATION;  Surgeon: Newt Minion, MD;  Location: [place];  Service: Orthopedics;  Laterality: Left;  . AMPUTATION Left 01/15/2018   Procedure: LEFT FOOT 2ND TOE AMPUTATION;  Surgeon: Newt Minion, MD;  Location: [place];  Service: Orthopedics;  Laterality: Left;  . carpel tunnel Bilateral   . CATARACT EXTRACTION W/ INTRAOCULAR LENS  IMPLANT, BILATERAL      . CHOLECYSTECTOMY    . COLONOSCOPY    . EYE SURGERY     laser surgery   . I&D EXTREMITY Left 09/15/2014   Procedure: Excision Necrotic Left Achilles, Skin Graft, apply Wound VAC;  Surgeon: Newt Minion, MD;  Location: [place];  Service: Orthopedics;  Laterality: Left;  . KIDNEY STONE SURGERY    . KNEE ARTHROSCOPY Bilateral   . LIGAMENT REPAIR    . TONSILLECTOMY    . VASECTOMY     Social History   Occupational History  . Occupation: Retired  Tobacco Use  . Smoking status: Never Smoker  .  Smokeless tobacco: Never Used  Substance and Sexual Activity  . Alcohol use: Yes    Alcohol/week: 0.0 standard drinks    Comment: rare - 1-2x /yr  . Drug use: No  . Sexual activity: Not on file

## 2018-06-18 DIAGNOSIS — C44519 Basal cell carcinoma of skin of other part of trunk: Secondary | ICD-10-CM | POA: Diagnosis not present

## 2018-06-18 DIAGNOSIS — L814 Other melanin hyperpigmentation: Secondary | ICD-10-CM | POA: Diagnosis not present

## 2018-06-18 DIAGNOSIS — C44719 Basal cell carcinoma of skin of left lower limb, including hip: Secondary | ICD-10-CM | POA: Diagnosis not present

## 2018-06-18 DIAGNOSIS — Z85828 Personal history of other malignant neoplasm of skin: Secondary | ICD-10-CM | POA: Diagnosis not present

## 2018-06-18 DIAGNOSIS — D225 Melanocytic nevi of trunk: Secondary | ICD-10-CM | POA: Diagnosis not present

## 2018-06-18 DIAGNOSIS — D1801 Hemangioma of skin and subcutaneous tissue: Secondary | ICD-10-CM | POA: Diagnosis not present

## 2018-06-18 DIAGNOSIS — D692 Other nonthrombocytopenic purpura: Secondary | ICD-10-CM | POA: Diagnosis not present

## 2018-06-18 DIAGNOSIS — L570 Actinic keratosis: Secondary | ICD-10-CM | POA: Diagnosis not present

## 2018-06-18 DIAGNOSIS — H61022 Chronic perichondritis of left external ear: Secondary | ICD-10-CM | POA: Diagnosis not present

## 2018-07-01 DIAGNOSIS — I4891 Unspecified atrial fibrillation: Secondary | ICD-10-CM | POA: Diagnosis not present

## 2018-07-01 DIAGNOSIS — I509 Heart failure, unspecified: Secondary | ICD-10-CM | POA: Diagnosis not present

## 2018-07-01 DIAGNOSIS — E0840 Diabetes mellitus due to underlying condition with diabetic neuropathy, unspecified: Secondary | ICD-10-CM | POA: Diagnosis not present

## 2018-07-01 DIAGNOSIS — E11319 Type 2 diabetes mellitus with unspecified diabetic retinopathy without macular edema: Secondary | ICD-10-CM | POA: Diagnosis not present

## 2018-07-01 DIAGNOSIS — E782 Mixed hyperlipidemia: Secondary | ICD-10-CM | POA: Diagnosis not present

## 2018-07-01 DIAGNOSIS — G4733 Obstructive sleep apnea (adult) (pediatric): Secondary | ICD-10-CM | POA: Diagnosis not present

## 2018-07-01 DIAGNOSIS — M109 Gout, unspecified: Secondary | ICD-10-CM | POA: Diagnosis not present

## 2018-07-01 DIAGNOSIS — I6789 Other cerebrovascular disease: Secondary | ICD-10-CM | POA: Diagnosis not present

## 2018-07-01 DIAGNOSIS — Z9111 Patient's noncompliance with dietary regimen: Secondary | ICD-10-CM | POA: Diagnosis not present

## 2018-07-13 ENCOUNTER — Encounter (INDEPENDENT_AMBULATORY_CARE_PROVIDER_SITE_OTHER): Payer: Self-pay | Admitting: Orthopedic Surgery

## 2018-07-13 ENCOUNTER — Ambulatory Visit (INDEPENDENT_AMBULATORY_CARE_PROVIDER_SITE_OTHER): Payer: Medicare Other | Admitting: Physician Assistant

## 2018-07-13 VITALS — Ht 69.0 in | Wt 287.0 lb

## 2018-07-13 DIAGNOSIS — E1142 Type 2 diabetes mellitus with diabetic polyneuropathy: Secondary | ICD-10-CM

## 2018-07-13 DIAGNOSIS — Z794 Long term (current) use of insulin: Secondary | ICD-10-CM

## 2018-07-13 DIAGNOSIS — Z89422 Acquired absence of other left toe(s): Secondary | ICD-10-CM | POA: Diagnosis not present

## 2018-07-14 ENCOUNTER — Encounter (INDEPENDENT_AMBULATORY_CARE_PROVIDER_SITE_OTHER): Payer: Self-pay | Admitting: Physician Assistant

## 2018-07-14 NOTE — Progress Notes (Signed)
Office Visit Note   Patient: Oscar [name].           Date of Birth: 23-Apr-1946           MRN: [id number] Visit Date: 07/13/2018              Requested by: Josetta Huddle, MD [address] [place], [place] [address] PCP: Josetta Huddle, MD  Chief Complaint  Patient presents with  . Left Foot - Follow-up    2nd Toe amp; vive socks      HPI: The patient is a 72 year old male who is seen for follow-up of his left foot second toe amputation on 01/15/2018.  He has been using Vive silver compression stockings and nitroglycerin patches.  He reports that his edema remains well controlled.  He has some occasional sharp pain but otherwise is doing quite well.  He does have very dry skin despite using cocoa butter and she better to his left foot and we discussed he could use a lotion with lactic acid such as Lac-Hydrin, or AmLactin to the foot for his severe dry skin.  Assessment & Plan: Visit Diagnoses:  1. History of complete ray amputation of second toe of left foot ([place])   2. Type 2 diabetes mellitus with diabetic polyneuropathy, with long-term current use of insulin ([place])     Plan: Patient will continue with his Vive silver compression stocking.  Recommended Lac-Hydrin or AmLactin for severe xerosis of the skin on his foot and heel.  He will follow-up here in 8 weeks or sooner should he have difficulties in the interim.  Follow-Up Instructions: Return in about 8 weeks (around 09/07/2018).   Ortho Exam  Patient is alert, oriented, no adenopathy, well-dressed, normal affect, normal respiratory effort. The left foot second toe amputation site is well-healed.  There are no signs of cellulitis or infection.  There is minimal to no edema.  He has palpable pedal pulses.  He does have xerosis of the plantar foot especially about the heel.  Imaging: No results found. No images are attached to the encounter.  Labs: Lab Results  Component Value Date   HGBA1C 10.1 (H)  09/08/2016   HGBA1C 9.1 (H) 08/28/2014   HGBA1C 10.1 (H) 08/07/2013   ESRSEDRATE 75 (H) 08/28/2014   ESRSEDRATE 42 (H) 11/17/2012   CRP 12.9 (H) 08/28/2014   LABURIC 3.0 (L) 08/31/2014   REPTSTATUS 10/03/2017 FINAL 09/30/2017   GRAMSTAIN  09/30/2017    RARE WBC PRESENT, PREDOMINANTLY MONONUCLEAR NO ORGANISMS SEEN    CULT  09/30/2017    NO GROWTH 2 DAYS Performed at [facility], [place] [address] [place],  [address]      Lab Results  Component Value Date   ALBUMIN 3.3 (L) 09/07/2016   ALBUMIN 3.6 09/15/2014   ALBUMIN 3.1 (L) 08/29/2014   LABURIC 3.0 (L) 08/31/2014    Body mass index is 42.38 kg/m.  Orders:  No orders of the defined types were placed in this encounter.  No orders of the defined types were placed in this encounter.    Procedures: No procedures performed  Clinical Data: No additional findings.  ROS:  All other systems negative, except as noted in the HPI. Review of Systems  Objective: Vital Signs: Ht 5\' 9"  (1.753 m)   Wt 287 lb (130.2 kg)   BMI 42.38 kg/m   Specialty Comments:  No specialty comments available.  PMFS History: Patient Active Problem List   Diagnosis Date Noted  .  Pain in left foot 04/21/2018  . PVD (peripheral vascular disease) ([place]) 04/21/2018  . Amputated toe, left ([place]) 01/21/2018  . Achilles tendon contracture, left 12/28/2017  . Dehiscence of amputation stump ([place]) 11/10/2017  . Great toe amputation status, left 11/10/2017  . Memory change 04/14/2017  . Cutaneous abscess of left foot 04/09/2017  . Non-pressure chronic ulcer of right heel and midfoot limited to breakdown of skin ([place]) 10/07/2016  . Influenza A   . AKI (acute kidney injury) ([place]) 09/07/2016  . Acute encephalopathy 09/07/2016  . Acute respiratory failure with hypoxia ([place]) 09/07/2016  . Elevated lactic acid level 09/07/2016  . Elevated troponin 09/07/2016  . Coronary artery calcification seen on CT scan 10/28/2015  . Acute on chronic  diastolic (congestive) heart failure ([place]) 09/26/2015  . OSA (obstructive sleep apnea) 02/07/2015  . Sepsis ([place]) 08/28/2014  . Chronic diastolic heart failure ([place]) 09/21/2013  . CVA (cerebral infarction) 08/08/2013  . Dizziness 08/07/2013  . TIA (transient ischemic attack) 08/07/2013  . Atrial fibrillation ([place]) 08/07/2013  . Hypertension 08/07/2013  . Anemia 08/07/2013  . Dyspnea 06/21/2013  . Chest pain 11/16/2012  . Atrial fibrillation with RVR ([place]) 11/16/2012  . Gout 11/16/2012  . Diabetes mellitus type 2, uncontrolled, with complications ([place]) 16/08/930  . CKD (chronic kidney disease), stage II 11/16/2012  . Atypical atrial flutter ([place]) 11/16/2012  . Pleuritic chest pain 11/16/2012  . Morbid obesity ([place]) 11/16/2012  . Obstructive sleep apnea 11/16/2012   Past Medical History:  Diagnosis Date  . Anal fissure    h/o - no recent complications  . Arthritis    hips  . Atrial fibrillation ([place])   . Depression   . Diabetes mellitus without complication ([place])    improved after diet/exercise  . Enlarged prostate   . GERD (gastroesophageal reflux disease)   . Gout   . H/O hiatal hernia   . History of kidney stones   . Hx of typhoid fever    as a child  . Hypertension    improved after diet/exercise  . Memory change 04/14/2017  . OSA (obstructive sleep apnea)    wears cpap  . Pneumonia   . PONV (postoperative nausea and vomiting)    also difficulty waking up  . Stroke [facility]) 08/2013   short term memory loss (slight)    Family History  Problem Relation Age of Onset  . COPD Father   . Heart attack Sister        died age 58 of MI    Past Surgical History:  Procedure Laterality Date  . AMPUTATION Left 10/02/2017   Procedure: LEFT FOOT 1ST RAY AMPUTATION;  Surgeon: Newt Minion, MD;  Location: [place];  Service: Orthopedics;  Laterality: Left;  . AMPUTATION Left 01/15/2018   Procedure: LEFT FOOT 2ND TOE AMPUTATION;  Surgeon: Newt Minion, MD;  Location: [place];   Service: Orthopedics;  Laterality: Left;  . carpel tunnel Bilateral   . CATARACT EXTRACTION W/ INTRAOCULAR LENS  IMPLANT, BILATERAL    . CHOLECYSTECTOMY    . COLONOSCOPY    . EYE SURGERY     laser surgery   . I&D EXTREMITY Left 09/15/2014   Procedure: Excision Necrotic Left Achilles, Skin Graft, apply Wound VAC;  Surgeon: Newt Minion, MD;  Location: [facility] [place];  Service: Orthopedics;  Laterality: Left;  . KIDNEY STONE SURGERY    . KNEE ARTHROSCOPY Bilateral   . LIGAMENT REPAIR    . TONSILLECTOMY    . VASECTOMY  Social History   Occupational History  . Occupation: Retired  Tobacco Use  . Smoking status: Never Smoker  . Smokeless tobacco: Never Used  Substance and Sexual Activity  . Alcohol use: Yes    Alcohol/week: 0.0 standard drinks    Comment: rare - 1-2x /yr  . Drug use: No  . Sexual activity: Not on file

## 2018-07-28 DIAGNOSIS — C44719 Basal cell carcinoma of skin of left lower limb, including hip: Secondary | ICD-10-CM | POA: Diagnosis not present

## 2018-07-28 DIAGNOSIS — Z85828 Personal history of other malignant neoplasm of skin: Secondary | ICD-10-CM | POA: Diagnosis not present

## 2018-09-01 ENCOUNTER — Encounter (INDEPENDENT_AMBULATORY_CARE_PROVIDER_SITE_OTHER): Payer: Medicare Other | Admitting: Ophthalmology

## 2018-09-01 DIAGNOSIS — H35033 Hypertensive retinopathy, bilateral: Secondary | ICD-10-CM

## 2018-09-01 DIAGNOSIS — E113412 Type 2 diabetes mellitus with severe nonproliferative diabetic retinopathy with macular edema, left eye: Secondary | ICD-10-CM | POA: Diagnosis not present

## 2018-09-01 DIAGNOSIS — E113311 Type 2 diabetes mellitus with moderate nonproliferative diabetic retinopathy with macular edema, right eye: Secondary | ICD-10-CM

## 2018-09-01 DIAGNOSIS — E11311 Type 2 diabetes mellitus with unspecified diabetic retinopathy with macular edema: Secondary | ICD-10-CM | POA: Diagnosis not present

## 2018-09-01 DIAGNOSIS — H43813 Vitreous degeneration, bilateral: Secondary | ICD-10-CM | POA: Diagnosis not present

## 2018-09-01 DIAGNOSIS — I10 Essential (primary) hypertension: Secondary | ICD-10-CM | POA: Diagnosis not present

## 2018-09-06 ENCOUNTER — Encounter (INDEPENDENT_AMBULATORY_CARE_PROVIDER_SITE_OTHER): Payer: Medicare Other | Admitting: Ophthalmology

## 2018-09-06 DIAGNOSIS — H43813 Vitreous degeneration, bilateral: Secondary | ICD-10-CM

## 2018-09-06 DIAGNOSIS — H35033 Hypertensive retinopathy, bilateral: Secondary | ICD-10-CM | POA: Diagnosis not present

## 2018-09-06 DIAGNOSIS — H20011 Primary iridocyclitis, right eye: Secondary | ICD-10-CM

## 2018-09-06 DIAGNOSIS — I10 Essential (primary) hypertension: Secondary | ICD-10-CM

## 2018-09-06 DIAGNOSIS — E113313 Type 2 diabetes mellitus with moderate nonproliferative diabetic retinopathy with macular edema, bilateral: Secondary | ICD-10-CM

## 2018-09-06 DIAGNOSIS — E11311 Type 2 diabetes mellitus with unspecified diabetic retinopathy with macular edema: Secondary | ICD-10-CM

## 2018-09-13 ENCOUNTER — Encounter (INDEPENDENT_AMBULATORY_CARE_PROVIDER_SITE_OTHER): Payer: Self-pay | Admitting: Orthopedic Surgery

## 2018-09-13 ENCOUNTER — Ambulatory Visit (INDEPENDENT_AMBULATORY_CARE_PROVIDER_SITE_OTHER): Payer: Medicare Other | Admitting: Physician Assistant

## 2018-09-13 VITALS — Ht 69.0 in | Wt 287.0 lb

## 2018-09-13 DIAGNOSIS — Z6841 Body Mass Index (BMI) 40.0 and over, adult: Secondary | ICD-10-CM | POA: Diagnosis not present

## 2018-09-13 DIAGNOSIS — E1142 Type 2 diabetes mellitus with diabetic polyneuropathy: Secondary | ICD-10-CM | POA: Diagnosis not present

## 2018-09-13 DIAGNOSIS — Z794 Long term (current) use of insulin: Secondary | ICD-10-CM | POA: Diagnosis not present

## 2018-09-13 DIAGNOSIS — Z89422 Acquired absence of other left toe(s): Secondary | ICD-10-CM

## 2018-09-13 DIAGNOSIS — I739 Peripheral vascular disease, unspecified: Secondary | ICD-10-CM

## 2018-09-13 DIAGNOSIS — Z89412 Acquired absence of left great toe: Secondary | ICD-10-CM

## 2018-09-14 ENCOUNTER — Encounter (INDEPENDENT_AMBULATORY_CARE_PROVIDER_SITE_OTHER): Payer: Self-pay | Admitting: Physician Assistant

## 2018-09-14 NOTE — Progress Notes (Signed)
Office Visit Note   Patient: Oscar [name].           Date of Birth: 1945-12-28           MRN: [id number] Visit Date: 09/13/2018              Requested by: Josetta Huddle, MD [address] [place], [place] [address] PCP: Josetta Huddle, MD  Chief Complaint  Patient presents with  . Left Foot - Follow-up      HPI: The patient is a 73 year old gentleman who is seen for follow-up following a left foot second ray amputation on 01/21/2018, and left first ray amputation 10/02/2017. He reports that he has been wearing his silver compression stocking and utilizing a Hoka stiff soled shoe.  He is working on Achilles stretching.  He is pleased with his progress and has no concerns.  Assessment & Plan: Visit Diagnoses:  1. Acquired absence of left great toe ([place])   2. History of complete ray amputation of second toe of left foot ([place])   3. Type 2 diabetes mellitus with diabetic polyneuropathy, with long-term current use of insulin ([place])   4. PVD (peripheral vascular disease) ([place])   5. BMI 40.0-44.9, adult ([place])     Plan: Reinforced Achilles stretching exercises with the patient.  Continue Hoka or other stiff soled shoe for ambulation.  Continue silver compression socks.  Also recommended a AmLactin lotion for dry skin over the feet.  At this point he will follow-up here on an as-needed basis.  Follow-Up Instructions: Return if symptoms worsen or fail to improve.   Ortho Exam  Patient is alert, oriented, no adenopathy, well-dressed, normal affect, normal respiratory effort. The left foot first and second amputation sites are well-healed and without evidence of cellulitis or infection.  There is no residual edema.  His left ankle range of motion is to neutral and we again reviewed Achilles stretching exercises.  He has good palpable pedal pulses.  He has no calluses or signs of breakdown.  Imaging: No results found. No images are attached to the encounter.  Labs: Lab  Results  Component Value Date   HGBA1C 10.1 (H) 09/08/2016   HGBA1C 9.1 (H) 08/28/2014   HGBA1C 10.1 (H) 08/07/2013   ESRSEDRATE 75 (H) 08/28/2014   ESRSEDRATE 42 (H) 11/17/2012   CRP 12.9 (H) 08/28/2014   LABURIC 3.0 (L) 08/31/2014   REPTSTATUS 10/03/2017 FINAL 09/30/2017   GRAMSTAIN  09/30/2017    RARE WBC PRESENT, PREDOMINANTLY MONONUCLEAR NO ORGANISMS SEEN    CULT  09/30/2017    NO GROWTH 2 DAYS Performed at [facility], [place] [address] [place],  [address]      Lab Results  Component Value Date   ALBUMIN 3.3 (L) 09/07/2016   ALBUMIN 3.6 09/15/2014   ALBUMIN 3.1 (L) 08/29/2014   LABURIC 3.0 (L) 08/31/2014    Body mass index is 42.38 kg/m.  Orders:  No orders of the defined types were placed in this encounter.  No orders of the defined types were placed in this encounter.    Procedures: No procedures performed  Clinical Data: No additional findings.  ROS:  All other systems negative, except as noted in the HPI. Review of Systems  Objective: Vital Signs: Ht 5\' 9"  (1.753 m)   Wt 287 lb (130.2 kg)   BMI 42.38 kg/m   Specialty Comments:  No specialty comments available.  PMFS History: Patient Active Problem List   Diagnosis Date Noted  .  Pain in left foot 04/21/2018  . PVD (peripheral vascular disease) ([place]) 04/21/2018  . Amputated toe, left ([place]) 01/21/2018  . Achilles tendon contracture, left 12/28/2017  . Dehiscence of amputation stump ([place]) 11/10/2017  . Great toe amputation status, left 11/10/2017  . Memory change 04/14/2017  . Cutaneous abscess of left foot 04/09/2017  . Non-pressure chronic ulcer of right heel and midfoot limited to breakdown of skin ([place]) 10/07/2016  . Influenza A   . AKI (acute kidney injury) ([place]) 09/07/2016  . Acute encephalopathy 09/07/2016  . Acute respiratory failure with hypoxia ([place]) 09/07/2016  . Elevated lactic acid level 09/07/2016  . Elevated troponin 09/07/2016  . Coronary artery  calcification seen on CT scan 10/28/2015  . Acute on chronic diastolic (congestive) heart failure ([place]) 09/26/2015  . OSA (obstructive sleep apnea) 02/07/2015  . Sepsis ([place]) 08/28/2014  . Chronic diastolic heart failure ([place]) 09/21/2013  . CVA (cerebral infarction) 08/08/2013  . Dizziness 08/07/2013  . TIA (transient ischemic attack) 08/07/2013  . Atrial fibrillation ([employer]) 08/07/2013  . Hypertension 08/07/2013  . Anemia 08/07/2013  . Dyspnea 06/21/2013  . Chest pain 11/16/2012  . Atrial fibrillation with RVR ([place]) 11/16/2012  . Gout 11/16/2012  . Diabetes mellitus type 2, uncontrolled, with complications ([place]) 25/49/8264  . CKD (chronic kidney disease), stage II 11/16/2012  . Atypical atrial flutter ([place]) 11/16/2012  . Pleuritic chest pain 11/16/2012  . Morbid obesity ([place]) 11/16/2012  . Obstructive sleep apnea 11/16/2012   Past Medical History:  Diagnosis Date  . Anal fissure    h/o - no recent complications  . Arthritis    hips  . Atrial fibrillation ([place])   . Depression   . Diabetes mellitus without complication ([place])    improved after diet/exercise  . Enlarged prostate   . GERD (gastroesophageal reflux disease)   . Gout   . H/O hiatal hernia   . History of kidney stones   . Hx of typhoid fever    as a child  . Hypertension    improved after diet/exercise  . Memory change 04/14/2017  . OSA (obstructive sleep apnea)    wears cpap  . Pneumonia   . PONV (postoperative nausea and vomiting)    also difficulty waking up  . Stroke [facility]) 08/2013   short term memory loss (slight)    Family History  Problem Relation Age of Onset  . COPD Father   . Heart attack Sister        died age 42 of MI    Past Surgical History:  Procedure Laterality Date  . AMPUTATION Left 10/02/2017   Procedure: LEFT FOOT 1ST RAY AMPUTATION;  Surgeon: Newt Minion, MD;  Location: [place];  Service: Orthopedics;  Laterality: Left;  . AMPUTATION Left 01/15/2018   Procedure: LEFT FOOT 2ND TOE  AMPUTATION;  Surgeon: Newt Minion, MD;  Location: [place];  Service: Orthopedics;  Laterality: Left;  . carpel tunnel Bilateral   . CATARACT EXTRACTION W/ INTRAOCULAR LENS  IMPLANT, BILATERAL    . CHOLECYSTECTOMY    . COLONOSCOPY    . EYE SURGERY     laser surgery   . I&D EXTREMITY Left 09/15/2014   Procedure: Excision Necrotic Left Achilles, Skin Graft, apply Wound VAC;  Surgeon: Newt Minion, MD;  Location: [place];  Service: Orthopedics;  Laterality: Left;  . KIDNEY STONE SURGERY    . KNEE ARTHROSCOPY Bilateral   . LIGAMENT REPAIR    . TONSILLECTOMY    . VASECTOMY  Social History   Occupational History  . Occupation: Retired  Tobacco Use  . Smoking status: Never Smoker  . Smokeless tobacco: Never Used  Substance and Sexual Activity  . Alcohol use: Yes    Alcohol/week: 0.0 standard drinks    Comment: rare - 1-2x /yr  . Drug use: No  . Sexual activity: Not on file

## 2018-09-29 ENCOUNTER — Encounter (INDEPENDENT_AMBULATORY_CARE_PROVIDER_SITE_OTHER): Payer: Medicare Other | Admitting: Ophthalmology

## 2018-09-29 DIAGNOSIS — E113313 Type 2 diabetes mellitus with moderate nonproliferative diabetic retinopathy with macular edema, bilateral: Secondary | ICD-10-CM

## 2018-09-29 DIAGNOSIS — I10 Essential (primary) hypertension: Secondary | ICD-10-CM | POA: Diagnosis not present

## 2018-09-29 DIAGNOSIS — H43813 Vitreous degeneration, bilateral: Secondary | ICD-10-CM | POA: Diagnosis not present

## 2018-09-29 DIAGNOSIS — E11311 Type 2 diabetes mellitus with unspecified diabetic retinopathy with macular edema: Secondary | ICD-10-CM

## 2018-09-29 DIAGNOSIS — H35033 Hypertensive retinopathy, bilateral: Secondary | ICD-10-CM

## 2018-10-25 ENCOUNTER — Other Ambulatory Visit (HOSPITAL_COMMUNITY): Payer: Self-pay | Admitting: Internal Medicine

## 2018-11-01 ENCOUNTER — Other Ambulatory Visit: Payer: Self-pay | Admitting: Neurology

## 2018-11-01 ENCOUNTER — Other Ambulatory Visit (INDEPENDENT_AMBULATORY_CARE_PROVIDER_SITE_OTHER): Payer: Self-pay | Admitting: Orthopedic Surgery

## 2018-11-01 ENCOUNTER — Encounter (INDEPENDENT_AMBULATORY_CARE_PROVIDER_SITE_OTHER): Payer: Medicare Other | Admitting: Ophthalmology

## 2018-11-23 DIAGNOSIS — Z125 Encounter for screening for malignant neoplasm of prostate: Secondary | ICD-10-CM | POA: Diagnosis not present

## 2018-11-23 DIAGNOSIS — E782 Mixed hyperlipidemia: Secondary | ICD-10-CM | POA: Diagnosis not present

## 2018-11-23 DIAGNOSIS — I4891 Unspecified atrial fibrillation: Secondary | ICD-10-CM | POA: Diagnosis not present

## 2018-11-23 DIAGNOSIS — I509 Heart failure, unspecified: Secondary | ICD-10-CM | POA: Diagnosis not present

## 2018-11-23 DIAGNOSIS — I6789 Other cerebrovascular disease: Secondary | ICD-10-CM | POA: Diagnosis not present

## 2018-11-23 DIAGNOSIS — Z1389 Encounter for screening for other disorder: Secondary | ICD-10-CM | POA: Diagnosis not present

## 2018-11-23 DIAGNOSIS — G4733 Obstructive sleep apnea (adult) (pediatric): Secondary | ICD-10-CM | POA: Diagnosis not present

## 2018-11-23 DIAGNOSIS — M109 Gout, unspecified: Secondary | ICD-10-CM | POA: Diagnosis not present

## 2018-11-23 DIAGNOSIS — E0840 Diabetes mellitus due to underlying condition with diabetic neuropathy, unspecified: Secondary | ICD-10-CM | POA: Diagnosis not present

## 2018-11-23 DIAGNOSIS — Z0000 Encounter for general adult medical examination without abnormal findings: Secondary | ICD-10-CM | POA: Diagnosis not present

## 2018-11-25 DIAGNOSIS — I509 Heart failure, unspecified: Secondary | ICD-10-CM | POA: Diagnosis not present

## 2018-11-25 DIAGNOSIS — E1165 Type 2 diabetes mellitus with hyperglycemia: Secondary | ICD-10-CM | POA: Diagnosis not present

## 2018-11-25 DIAGNOSIS — F322 Major depressive disorder, single episode, severe without psychotic features: Secondary | ICD-10-CM | POA: Diagnosis not present

## 2018-11-25 DIAGNOSIS — E0840 Diabetes mellitus due to underlying condition with diabetic neuropathy, unspecified: Secondary | ICD-10-CM | POA: Diagnosis not present

## 2018-11-25 DIAGNOSIS — Z79899 Other long term (current) drug therapy: Secondary | ICD-10-CM | POA: Diagnosis not present

## 2018-11-25 DIAGNOSIS — Z125 Encounter for screening for malignant neoplasm of prostate: Secondary | ICD-10-CM | POA: Diagnosis not present

## 2018-11-25 DIAGNOSIS — E782 Mixed hyperlipidemia: Secondary | ICD-10-CM | POA: Diagnosis not present

## 2018-11-25 DIAGNOSIS — E559 Vitamin D deficiency, unspecified: Secondary | ICD-10-CM | POA: Diagnosis not present

## 2019-01-12 ENCOUNTER — Telehealth: Payer: Self-pay | Admitting: *Deleted

## 2019-01-12 NOTE — Telephone Encounter (Signed)
Due to current COVID 19 pandemic, our office is severely reducing in office visits until further notice, in order to minimize the risk to our patients and healthcare providers.Unable to get in contact with the patient to convert their office visit with Judson Roch on 01/17/2019 into a doxy.me visit. I left a voicemail asking the patient to return my call. Office number was provided.     If patient calls back please convert their office visit into a doxy.me visit.

## 2019-01-13 NOTE — Telephone Encounter (Signed)
Spoke to pt and he consented to doxy.me visit Due to current COVID 19 pandemic, our office is severely reducing in office visits until further notice, in order to minimize the risk to our patients and healthcare providers.  Pt understands that although there may be some limitations with this type of visit, we will take all precautions to reduce any security or privacy concerns.  Pt understands that this will be treated like an in office visit and we will file with pt's insurance, and there may be a patient responsible charge related to this service. Spoke to daughter, sandie and she will set up email and will be there for appt.  email sent [email or web address].

## 2019-01-16 NOTE — Progress Notes (Signed)
Virtual Visit via Video Note  I connected with Oscar [name]. on 01/17/19 at 11:15 AM EDT by a video enabled telemedicine application and verified that I am speaking with the correct person using two identifiers.  Location: Patient: At his home  Provider: In the office    I discussed the limitations of evaluation and management by telemedicine and the availability of in person appointments. The patient expressed understanding and agreed to proceed.  History of Present Illness: 01/17/2019 SS: [name] is a 73 year old male with history of memory disturbance since his stroke that occurred about 4 years ago.  Unfortunately his wife of 25 years passed away unexpectedly last month.  He indicates there has not been any major change in his memory.  His daughter is managing his medications.  He reports he has a good appetite, does cooking.  He is able to manage his own ADLs.  He is driving a car without difficulty.  He denies any new problems or concerns or any changes to his health at this time.  In his free time he likes to go to the lake, fish, or spend time with his grand children.  He is currently taking Aricept 10 mg at bedtime and is tolerating the medication well.  04/15/2018 Dr. Jannifer Franklin: Oscar [name] is a 74 year old right-handed white male with a history of diabetes and obesity.  The patient has had recent issues with osteomyelitis affecting the left foot, he required amputation of the great toe and second toe, he has recovered from this.  The patient has come in with his wife who indicates that the patient has continued to have a slow gradual change in his memory since last seen 1 year ago.  The patient has had MRI of the brain that did not show any significant change from 2014, he has not been on any medications for memory.  He does report some mild tremor off and on with his upper extremities.  He does have occasional slightly low blood sugars to around 60, but never much lower than this.   He returns for an evaluation.   Observations/Objective: Alert, answers questions appropriately, follows commands, speech is clear and concise, he is alert and oriented.  Moca-blind was 14/22.  Assessment and Plan: 1.  Memory disturbance  He appears to have a slight decline in his memory with a score of 14/22 on Moca-blind, compared with an MMSE of 28/30 in September 2019.  He denies any major changes in his memory.  Unfortunately his wife passed away unexpectedly 1 month ago.  He will continue Aricept 10 mg at bedtime.  I will send in a refill.  We discussed adding Namenda, he has decided to hold off at this time. We will discuss at his next office visit.  We discussed the importance of healthy eating, consistent exercise, brain stimulating exercises to promote memory.  Follow Up Instructions: 6 months, I have made him a follow-up appointment for December 9 at 1015   I discussed the assessment and treatment plan with the patient. The patient was provided an opportunity to ask questions and all were answered. The patient agreed with the plan and demonstrated an understanding of the instructions.   The patient was advised to call back or seek an in-person evaluation if the symptoms worsen or if the condition fails to improve as anticipated.  I provided 20 minutes of non-face-to-face time during this encounter.  Butler Denmark, Laqueta Jean, DNP  [facility] [address]  [place], [place] [address] [phone]

## 2019-01-17 ENCOUNTER — Encounter: Payer: Self-pay | Admitting: Neurology

## 2019-01-17 ENCOUNTER — Ambulatory Visit: Payer: Medicare Other | Admitting: Adult Health

## 2019-01-17 ENCOUNTER — Other Ambulatory Visit: Payer: Self-pay

## 2019-01-17 ENCOUNTER — Ambulatory Visit (INDEPENDENT_AMBULATORY_CARE_PROVIDER_SITE_OTHER): Payer: Medicare Other | Admitting: Neurology

## 2019-01-17 DIAGNOSIS — R413 Other amnesia: Secondary | ICD-10-CM | POA: Diagnosis not present

## 2019-01-17 MED ORDER — DONEPEZIL HCL 10 MG PO TABS: ORAL_TABLET | ORAL | 1 refills | Status: DC

## 2019-01-17 NOTE — Progress Notes (Signed)
I have read the note, and I agree with the clinical assessment and plan.  Charles K Willis   

## 2019-02-23 ENCOUNTER — Other Ambulatory Visit (INDEPENDENT_AMBULATORY_CARE_PROVIDER_SITE_OTHER): Payer: Self-pay | Admitting: Orthopedic Surgery

## 2019-03-28 DIAGNOSIS — D509 Iron deficiency anemia, unspecified: Secondary | ICD-10-CM | POA: Diagnosis not present

## 2019-03-28 DIAGNOSIS — N400 Enlarged prostate without lower urinary tract symptoms: Secondary | ICD-10-CM | POA: Diagnosis not present

## 2019-03-28 DIAGNOSIS — E0840 Diabetes mellitus due to underlying condition with diabetic neuropathy, unspecified: Secondary | ICD-10-CM | POA: Diagnosis not present

## 2019-03-28 DIAGNOSIS — I6789 Other cerebrovascular disease: Secondary | ICD-10-CM | POA: Diagnosis not present

## 2019-03-28 DIAGNOSIS — E11319 Type 2 diabetes mellitus with unspecified diabetic retinopathy without macular edema: Secondary | ICD-10-CM | POA: Diagnosis not present

## 2019-03-28 DIAGNOSIS — I4891 Unspecified atrial fibrillation: Secondary | ICD-10-CM | POA: Diagnosis not present

## 2019-03-28 DIAGNOSIS — I509 Heart failure, unspecified: Secondary | ICD-10-CM | POA: Diagnosis not present

## 2019-03-28 DIAGNOSIS — E782 Mixed hyperlipidemia: Secondary | ICD-10-CM | POA: Diagnosis not present

## 2019-03-28 DIAGNOSIS — E1165 Type 2 diabetes mellitus with hyperglycemia: Secondary | ICD-10-CM | POA: Diagnosis not present

## 2019-03-28 DIAGNOSIS — F322 Major depressive disorder, single episode, severe without psychotic features: Secondary | ICD-10-CM | POA: Diagnosis not present

## 2019-05-27 DIAGNOSIS — I4891 Unspecified atrial fibrillation: Secondary | ICD-10-CM | POA: Diagnosis not present

## 2019-05-27 DIAGNOSIS — N400 Enlarged prostate without lower urinary tract symptoms: Secondary | ICD-10-CM | POA: Diagnosis not present

## 2019-05-27 DIAGNOSIS — E11319 Type 2 diabetes mellitus with unspecified diabetic retinopathy without macular edema: Secondary | ICD-10-CM | POA: Diagnosis not present

## 2019-05-27 DIAGNOSIS — E0840 Diabetes mellitus due to underlying condition with diabetic neuropathy, unspecified: Secondary | ICD-10-CM | POA: Diagnosis not present

## 2019-05-27 DIAGNOSIS — E08621 Diabetes mellitus due to underlying condition with foot ulcer: Secondary | ICD-10-CM | POA: Diagnosis not present

## 2019-05-27 DIAGNOSIS — I6789 Other cerebrovascular disease: Secondary | ICD-10-CM | POA: Diagnosis not present

## 2019-05-27 DIAGNOSIS — F322 Major depressive disorder, single episode, severe without psychotic features: Secondary | ICD-10-CM | POA: Diagnosis not present

## 2019-05-27 DIAGNOSIS — E1165 Type 2 diabetes mellitus with hyperglycemia: Secondary | ICD-10-CM | POA: Diagnosis not present

## 2019-05-27 DIAGNOSIS — D509 Iron deficiency anemia, unspecified: Secondary | ICD-10-CM | POA: Diagnosis not present

## 2019-05-27 DIAGNOSIS — E782 Mixed hyperlipidemia: Secondary | ICD-10-CM | POA: Diagnosis not present

## 2019-05-27 DIAGNOSIS — I509 Heart failure, unspecified: Secondary | ICD-10-CM | POA: Diagnosis not present

## 2019-07-08 DIAGNOSIS — E0840 Diabetes mellitus due to underlying condition with diabetic neuropathy, unspecified: Secondary | ICD-10-CM | POA: Diagnosis not present

## 2019-07-08 DIAGNOSIS — E1165 Type 2 diabetes mellitus with hyperglycemia: Secondary | ICD-10-CM | POA: Diagnosis not present

## 2019-07-08 DIAGNOSIS — E11319 Type 2 diabetes mellitus with unspecified diabetic retinopathy without macular edema: Secondary | ICD-10-CM | POA: Diagnosis not present

## 2019-07-08 DIAGNOSIS — E08621 Diabetes mellitus due to underlying condition with foot ulcer: Secondary | ICD-10-CM | POA: Diagnosis not present

## 2019-07-08 DIAGNOSIS — I509 Heart failure, unspecified: Secondary | ICD-10-CM | POA: Diagnosis not present

## 2019-07-08 DIAGNOSIS — I4891 Unspecified atrial fibrillation: Secondary | ICD-10-CM | POA: Diagnosis not present

## 2019-07-08 DIAGNOSIS — I6789 Other cerebrovascular disease: Secondary | ICD-10-CM | POA: Diagnosis not present

## 2019-07-08 DIAGNOSIS — F322 Major depressive disorder, single episode, severe without psychotic features: Secondary | ICD-10-CM | POA: Diagnosis not present

## 2019-07-08 DIAGNOSIS — N400 Enlarged prostate without lower urinary tract symptoms: Secondary | ICD-10-CM | POA: Diagnosis not present

## 2019-07-08 DIAGNOSIS — E782 Mixed hyperlipidemia: Secondary | ICD-10-CM | POA: Diagnosis not present

## 2019-07-08 DIAGNOSIS — D509 Iron deficiency anemia, unspecified: Secondary | ICD-10-CM | POA: Diagnosis not present

## 2019-07-20 ENCOUNTER — Ambulatory Visit: Payer: Medicare Other | Admitting: Neurology

## 2019-07-29 ENCOUNTER — Other Ambulatory Visit: Payer: Self-pay | Admitting: Neurology

## 2019-08-08 NOTE — Progress Notes (Signed)
    Virtual Visit via Video Note  I connected with Sheliah [name]. on 08/09/19 at  2:15 PM EST by a video enabled telemedicine application and verified that I am speaking with the correct person using two identifiers.  Location: Patient: At his home Provider: In the office    I discussed the limitations of evaluation and management by telemedicine and the availability of in person appointments. The patient expressed understanding and agreed to proceed.  History of Present Illness: 08/09/2019 SS: Oscar [name] is a 73 year old male with history of memory disturbance since a stroke that occurred about 5 years ago.  Unfortunately, earlier this year his wife unexpectedly passed away.  He lives with his son.  He is able to perform all of his own ADLs.  He says he does have someone who comes in the home every 2 weeks to help clean.  His daughter manages his medication.  He continues to drive, but does not drive at night or long distances.  He has not had any falls.  He has difficulty hearing.  He has trouble remembering his doctor's appointments.  His daughter manages his finances, but that is more so due to difficulty with his vision.  He remains on Aricept.  He feels his memory is overall stable. He denies any new issues that have arisen. He presents today for evaluation via virtual visit.  01/17/2019 SS: Oscar [name] is a 73 year old male with history of memory disturbance since his stroke that occurred about 4 years ago.  Unfortunately his wife of 31 years passed away unexpectedly last month.  He indicates there has not been any major change in his memory.  His daughter is managing his medications.  He reports he has a good appetite, does cooking.  He is able to manage his own ADLs.  He is driving a car without difficulty.  He denies any new problems or concerns or any changes to his health at this time.  In his free time he likes to go to the lake, fish, or spend time with his grand children.  He is  currently taking Aricept 10 mg at bedtime and is tolerating the medication well.   Observations/Objective: Via virtual visit, he is alert and oriented, answers questions appropriately, speech is clear and concise, follows commands, facial symmetry noted, no difficulty with gait. He is very pleasant, participatory, laughing/upbeat   MoCA-blind was 19/22, able to name 11 words that start with letter F  Assessment and Plan: 1.  Memory disturbance  His memory seems to be doing quite well.  He feels his memory is overall stable.  He will remain on Aricept 10 mg at bedtime.  We discussed Namenda, he does not feel the addition is needed at this time.  He will follow-up in the office in 6 months or sooner if needed.  Follow Up Instructions: 6 months 02/08/2020 1245   I discussed the assessment and treatment plan with the patient. The patient was provided an opportunity to ask questions and all were answered. The patient agreed with the plan and demonstrated an understanding of the instructions.   The patient was advised to call back or seek an in-person evaluation if the symptoms worsen or if the condition fails to improve as anticipated.  I provided 15 minutes of non-face-to-face time during this encounter.  Evangeline Dakin, DNP  [facility] [address] [place], [place] [address] [phone]

## 2019-08-09 ENCOUNTER — Encounter: Payer: Self-pay | Admitting: Neurology

## 2019-08-09 ENCOUNTER — Telehealth (INDEPENDENT_AMBULATORY_CARE_PROVIDER_SITE_OTHER): Payer: Medicare Other | Admitting: Neurology

## 2019-08-09 DIAGNOSIS — R413 Other amnesia: Secondary | ICD-10-CM

## 2019-08-09 NOTE — Progress Notes (Signed)
I have read the note, and I agree with the clinical assessment and plan.  Oscar [name]   

## 2019-08-10 DIAGNOSIS — D509 Iron deficiency anemia, unspecified: Secondary | ICD-10-CM | POA: Diagnosis not present

## 2019-08-10 DIAGNOSIS — I6789 Other cerebrovascular disease: Secondary | ICD-10-CM | POA: Diagnosis not present

## 2019-08-10 DIAGNOSIS — I4891 Unspecified atrial fibrillation: Secondary | ICD-10-CM | POA: Diagnosis not present

## 2019-08-10 DIAGNOSIS — E1165 Type 2 diabetes mellitus with hyperglycemia: Secondary | ICD-10-CM | POA: Diagnosis not present

## 2019-08-10 DIAGNOSIS — F322 Major depressive disorder, single episode, severe without psychotic features: Secondary | ICD-10-CM | POA: Diagnosis not present

## 2019-08-10 DIAGNOSIS — N400 Enlarged prostate without lower urinary tract symptoms: Secondary | ICD-10-CM | POA: Diagnosis not present

## 2019-08-10 DIAGNOSIS — E782 Mixed hyperlipidemia: Secondary | ICD-10-CM | POA: Diagnosis not present

## 2019-08-10 DIAGNOSIS — E0936 Drug or chemical induced diabetes mellitus with diabetic cataract: Secondary | ICD-10-CM | POA: Diagnosis not present

## 2019-08-10 DIAGNOSIS — E11319 Type 2 diabetes mellitus with unspecified diabetic retinopathy without macular edema: Secondary | ICD-10-CM | POA: Diagnosis not present

## 2019-08-10 DIAGNOSIS — E0840 Diabetes mellitus due to underlying condition with diabetic neuropathy, unspecified: Secondary | ICD-10-CM | POA: Diagnosis not present

## 2019-08-10 DIAGNOSIS — I509 Heart failure, unspecified: Secondary | ICD-10-CM | POA: Diagnosis not present

## 2019-08-10 DIAGNOSIS — E08621 Diabetes mellitus due to underlying condition with foot ulcer: Secondary | ICD-10-CM | POA: Diagnosis not present

## 2019-09-02 DIAGNOSIS — I509 Heart failure, unspecified: Secondary | ICD-10-CM | POA: Diagnosis not present

## 2019-09-02 DIAGNOSIS — F322 Major depressive disorder, single episode, severe without psychotic features: Secondary | ICD-10-CM | POA: Diagnosis not present

## 2019-09-02 DIAGNOSIS — N400 Enlarged prostate without lower urinary tract symptoms: Secondary | ICD-10-CM | POA: Diagnosis not present

## 2019-09-02 DIAGNOSIS — E08621 Diabetes mellitus due to underlying condition with foot ulcer: Secondary | ICD-10-CM | POA: Diagnosis not present

## 2019-09-02 DIAGNOSIS — E0840 Diabetes mellitus due to underlying condition with diabetic neuropathy, unspecified: Secondary | ICD-10-CM | POA: Diagnosis not present

## 2019-09-02 DIAGNOSIS — E11319 Type 2 diabetes mellitus with unspecified diabetic retinopathy without macular edema: Secondary | ICD-10-CM | POA: Diagnosis not present

## 2019-09-02 DIAGNOSIS — D509 Iron deficiency anemia, unspecified: Secondary | ICD-10-CM | POA: Diagnosis not present

## 2019-09-02 DIAGNOSIS — E782 Mixed hyperlipidemia: Secondary | ICD-10-CM | POA: Diagnosis not present

## 2019-09-02 DIAGNOSIS — I6789 Other cerebrovascular disease: Secondary | ICD-10-CM | POA: Diagnosis not present

## 2019-09-02 DIAGNOSIS — I4891 Unspecified atrial fibrillation: Secondary | ICD-10-CM | POA: Diagnosis not present

## 2019-09-08 DIAGNOSIS — Z79899 Other long term (current) drug therapy: Secondary | ICD-10-CM | POA: Diagnosis not present

## 2019-09-08 DIAGNOSIS — L011 Impetiginization of other dermatoses: Secondary | ICD-10-CM | POA: Diagnosis not present

## 2019-09-08 DIAGNOSIS — L2089 Other atopic dermatitis: Secondary | ICD-10-CM | POA: Diagnosis not present

## 2019-09-08 DIAGNOSIS — Z85828 Personal history of other malignant neoplasm of skin: Secondary | ICD-10-CM | POA: Diagnosis not present

## 2019-09-16 DIAGNOSIS — Z85828 Personal history of other malignant neoplasm of skin: Secondary | ICD-10-CM | POA: Diagnosis not present

## 2019-09-16 DIAGNOSIS — L011 Impetiginization of other dermatoses: Secondary | ICD-10-CM | POA: Diagnosis not present

## 2019-09-16 DIAGNOSIS — L309 Dermatitis, unspecified: Secondary | ICD-10-CM | POA: Diagnosis not present

## 2019-09-16 DIAGNOSIS — L0889 Other specified local infections of the skin and subcutaneous tissue: Secondary | ICD-10-CM | POA: Diagnosis not present

## 2019-09-30 DIAGNOSIS — I4891 Unspecified atrial fibrillation: Secondary | ICD-10-CM | POA: Diagnosis not present

## 2019-09-30 DIAGNOSIS — E08621 Diabetes mellitus due to underlying condition with foot ulcer: Secondary | ICD-10-CM | POA: Diagnosis not present

## 2019-09-30 DIAGNOSIS — I6789 Other cerebrovascular disease: Secondary | ICD-10-CM | POA: Diagnosis not present

## 2019-09-30 DIAGNOSIS — I509 Heart failure, unspecified: Secondary | ICD-10-CM | POA: Diagnosis not present

## 2019-09-30 DIAGNOSIS — E0840 Diabetes mellitus due to underlying condition with diabetic neuropathy, unspecified: Secondary | ICD-10-CM | POA: Diagnosis not present

## 2019-09-30 DIAGNOSIS — F322 Major depressive disorder, single episode, severe without psychotic features: Secondary | ICD-10-CM | POA: Diagnosis not present

## 2019-09-30 DIAGNOSIS — E782 Mixed hyperlipidemia: Secondary | ICD-10-CM | POA: Diagnosis not present

## 2019-09-30 DIAGNOSIS — N400 Enlarged prostate without lower urinary tract symptoms: Secondary | ICD-10-CM | POA: Diagnosis not present

## 2019-09-30 DIAGNOSIS — D509 Iron deficiency anemia, unspecified: Secondary | ICD-10-CM | POA: Diagnosis not present

## 2019-09-30 DIAGNOSIS — E093519 Drug or chemical induced diabetes mellitus with proliferative diabetic retinopathy with macular edema, unspecified eye: Secondary | ICD-10-CM | POA: Diagnosis not present

## 2019-09-30 DIAGNOSIS — E1165 Type 2 diabetes mellitus with hyperglycemia: Secondary | ICD-10-CM | POA: Diagnosis not present

## 2019-10-13 DIAGNOSIS — J40 Bronchitis, not specified as acute or chronic: Secondary | ICD-10-CM | POA: Diagnosis not present

## 2019-10-26 DIAGNOSIS — I6789 Other cerebrovascular disease: Secondary | ICD-10-CM | POA: Diagnosis not present

## 2019-10-26 DIAGNOSIS — I4891 Unspecified atrial fibrillation: Secondary | ICD-10-CM | POA: Diagnosis not present

## 2019-10-26 DIAGNOSIS — E782 Mixed hyperlipidemia: Secondary | ICD-10-CM | POA: Diagnosis not present

## 2019-10-26 DIAGNOSIS — F322 Major depressive disorder, single episode, severe without psychotic features: Secondary | ICD-10-CM | POA: Diagnosis not present

## 2019-10-26 DIAGNOSIS — I509 Heart failure, unspecified: Secondary | ICD-10-CM | POA: Diagnosis not present

## 2019-10-26 DIAGNOSIS — D509 Iron deficiency anemia, unspecified: Secondary | ICD-10-CM | POA: Diagnosis not present

## 2019-10-26 DIAGNOSIS — E1165 Type 2 diabetes mellitus with hyperglycemia: Secondary | ICD-10-CM | POA: Diagnosis not present

## 2019-10-26 DIAGNOSIS — N400 Enlarged prostate without lower urinary tract symptoms: Secondary | ICD-10-CM | POA: Diagnosis not present

## 2019-10-26 DIAGNOSIS — E113599 Type 2 diabetes mellitus with proliferative diabetic retinopathy without macular edema, unspecified eye: Secondary | ICD-10-CM | POA: Diagnosis not present

## 2019-10-26 DIAGNOSIS — E093519 Drug or chemical induced diabetes mellitus with proliferative diabetic retinopathy with macular edema, unspecified eye: Secondary | ICD-10-CM | POA: Diagnosis not present

## 2019-11-09 ENCOUNTER — Other Ambulatory Visit (HOSPITAL_COMMUNITY): Payer: Self-pay | Admitting: Internal Medicine

## 2019-11-24 DIAGNOSIS — E0840 Diabetes mellitus due to underlying condition with diabetic neuropathy, unspecified: Secondary | ICD-10-CM | POA: Diagnosis not present

## 2019-11-24 DIAGNOSIS — D509 Iron deficiency anemia, unspecified: Secondary | ICD-10-CM | POA: Diagnosis not present

## 2019-11-24 DIAGNOSIS — Z79899 Other long term (current) drug therapy: Secondary | ICD-10-CM | POA: Diagnosis not present

## 2019-11-24 DIAGNOSIS — I6789 Other cerebrovascular disease: Secondary | ICD-10-CM | POA: Diagnosis not present

## 2019-11-24 DIAGNOSIS — E782 Mixed hyperlipidemia: Secondary | ICD-10-CM | POA: Diagnosis not present

## 2019-11-24 DIAGNOSIS — I509 Heart failure, unspecified: Secondary | ICD-10-CM | POA: Diagnosis not present

## 2019-11-24 DIAGNOSIS — Z23 Encounter for immunization: Secondary | ICD-10-CM | POA: Diagnosis not present

## 2019-11-24 DIAGNOSIS — Z1389 Encounter for screening for other disorder: Secondary | ICD-10-CM | POA: Diagnosis not present

## 2019-11-24 DIAGNOSIS — I4891 Unspecified atrial fibrillation: Secondary | ICD-10-CM | POA: Diagnosis not present

## 2019-11-24 DIAGNOSIS — D6869 Other thrombophilia: Secondary | ICD-10-CM | POA: Diagnosis not present

## 2019-11-24 DIAGNOSIS — E559 Vitamin D deficiency, unspecified: Secondary | ICD-10-CM | POA: Diagnosis not present

## 2019-11-24 DIAGNOSIS — Z0000 Encounter for general adult medical examination without abnormal findings: Secondary | ICD-10-CM | POA: Diagnosis not present

## 2019-11-24 DIAGNOSIS — N400 Enlarged prostate without lower urinary tract symptoms: Secondary | ICD-10-CM | POA: Diagnosis not present

## 2019-11-24 DIAGNOSIS — F322 Major depressive disorder, single episode, severe without psychotic features: Secondary | ICD-10-CM | POA: Diagnosis not present

## 2019-11-24 DIAGNOSIS — E1165 Type 2 diabetes mellitus with hyperglycemia: Secondary | ICD-10-CM | POA: Diagnosis not present

## 2019-12-01 DIAGNOSIS — I6789 Other cerebrovascular disease: Secondary | ICD-10-CM | POA: Diagnosis not present

## 2019-12-01 DIAGNOSIS — Z794 Long term (current) use of insulin: Secondary | ICD-10-CM | POA: Diagnosis not present

## 2019-12-01 DIAGNOSIS — F322 Major depressive disorder, single episode, severe without psychotic features: Secondary | ICD-10-CM | POA: Diagnosis not present

## 2019-12-01 DIAGNOSIS — D509 Iron deficiency anemia, unspecified: Secondary | ICD-10-CM | POA: Diagnosis not present

## 2019-12-01 DIAGNOSIS — I509 Heart failure, unspecified: Secondary | ICD-10-CM | POA: Diagnosis not present

## 2019-12-01 DIAGNOSIS — E1165 Type 2 diabetes mellitus with hyperglycemia: Secondary | ICD-10-CM | POA: Diagnosis not present

## 2019-12-01 DIAGNOSIS — N400 Enlarged prostate without lower urinary tract symptoms: Secondary | ICD-10-CM | POA: Diagnosis not present

## 2019-12-01 DIAGNOSIS — E782 Mixed hyperlipidemia: Secondary | ICD-10-CM | POA: Diagnosis not present

## 2019-12-01 DIAGNOSIS — I4891 Unspecified atrial fibrillation: Secondary | ICD-10-CM | POA: Diagnosis not present

## 2019-12-01 DIAGNOSIS — E113599 Type 2 diabetes mellitus with proliferative diabetic retinopathy without macular edema, unspecified eye: Secondary | ICD-10-CM | POA: Diagnosis not present

## 2020-01-23 ENCOUNTER — Other Ambulatory Visit: Payer: Self-pay | Admitting: Neurology

## 2020-01-25 ENCOUNTER — Other Ambulatory Visit (HOSPITAL_COMMUNITY): Payer: Self-pay | Admitting: Internal Medicine

## 2020-01-26 DIAGNOSIS — I509 Heart failure, unspecified: Secondary | ICD-10-CM | POA: Diagnosis not present

## 2020-01-26 DIAGNOSIS — I6789 Other cerebrovascular disease: Secondary | ICD-10-CM | POA: Diagnosis not present

## 2020-01-26 DIAGNOSIS — F322 Major depressive disorder, single episode, severe without psychotic features: Secondary | ICD-10-CM | POA: Diagnosis not present

## 2020-01-26 DIAGNOSIS — D509 Iron deficiency anemia, unspecified: Secondary | ICD-10-CM | POA: Diagnosis not present

## 2020-01-26 DIAGNOSIS — N400 Enlarged prostate without lower urinary tract symptoms: Secondary | ICD-10-CM | POA: Diagnosis not present

## 2020-01-26 DIAGNOSIS — E1165 Type 2 diabetes mellitus with hyperglycemia: Secondary | ICD-10-CM | POA: Diagnosis not present

## 2020-01-26 DIAGNOSIS — E0840 Diabetes mellitus due to underlying condition with diabetic neuropathy, unspecified: Secondary | ICD-10-CM | POA: Diagnosis not present

## 2020-01-26 DIAGNOSIS — E782 Mixed hyperlipidemia: Secondary | ICD-10-CM | POA: Diagnosis not present

## 2020-01-26 DIAGNOSIS — I4891 Unspecified atrial fibrillation: Secondary | ICD-10-CM | POA: Diagnosis not present

## 2020-02-08 ENCOUNTER — Other Ambulatory Visit: Payer: Self-pay

## 2020-02-08 ENCOUNTER — Ambulatory Visit (INDEPENDENT_AMBULATORY_CARE_PROVIDER_SITE_OTHER): Payer: Medicare Other | Admitting: Neurology

## 2020-02-08 ENCOUNTER — Encounter: Payer: Self-pay | Admitting: Neurology

## 2020-02-08 VITALS — BP 122/61 | HR 61 | Ht 69.0 in | Wt 291.0 lb

## 2020-02-08 DIAGNOSIS — R413 Other amnesia: Secondary | ICD-10-CM

## 2020-02-08 MED ORDER — DONEPEZIL HCL 10 MG PO TABS: ORAL_TABLET | ORAL | 3 refills | Status: DC

## 2020-02-08 NOTE — Progress Notes (Signed)
PATIENT: Oscar [name]. DOB: 02/19/1946  REASON FOR VISIT: follow up HISTORY FROM: patient  HISTORY OF PRESENT ILLNESS: Today 02/08/20  Oscar [name] is a 74 year old male with history of memory disturbance since his stroke.  His son lives with him, his wife passed away in 2018-11-06.  He remains on Aricept 10 mg at bedtime.  Feels memory is overall stable, has trouble with short term memory. Thinks some of his memory is related to his poor hearing. His daughter manages his medications and finances.  He has someone who comes to clean every 2 weeks.  He does his own laundry.  He does local driving to the grocery and pharmacy, does this well.  He has started a diet, has lost about 28 lbs. his health is overall stable, uses CPAP.  No new problems or concerns.  Presents today accompanied by his daughter.  HISTORY  08/09/2019 SS: Oscar [name] is a 74 year old male with history of memory disturbance since a stroke that occurred about 5 years ago.  Unfortunately, earlier this year his wife unexpectedly passed away.  He lives with his son.  He is able to perform all of his own ADLs.  He says he does have someone who comes in the home every 2 weeks to help clean.  His daughter manages his medication.  He continues to drive, but does not drive at night or long distances.  He has not had any falls.  He has difficulty hearing.  He has trouble remembering his doctor's appointments.  His daughter manages his finances, but that is more so due to difficulty with his vision.  He remains on Aricept.  He feels his memory is overall stable. He denies any new issues that have arisen. He presents today for evaluation via virtual visit.  REVIEW OF SYSTEMS: Out of a complete 14 system review of symptoms, the patient complains only of the following symptoms, and all other reviewed systems are negative.  Memory loss  ALLERGIES: Allergies  Allergen Reactions  . Iohexol Hives and Other (See Comments)     Desc: HIVES S/P  13HR.PREMEDS, ?LOW DOSAGE PREMEDS   . Ivp Dye [Iodinated Diagnostic Agents] Hives and Other (See Comments)    welps     HOME MEDICATIONS: Outpatient Medications Prior to Visit  Medication Sig Dispense Refill  . acetaminophen (TYLENOL) 500 MG tablet Take 1,000 mg by mouth 2 (two) times daily as needed for moderate pain.     Marland Kitchen allopurinol (ZYLOPRIM) 300 MG tablet Take 300 mg by mouth daily.  11  . B-D INS SYRINGE 0.5CC/30GX1/2" 30G X 1/2" 0.5 ML MISC USE THREE TIMES A DAY [id number] INJECTION  2  . blood glucose meter kit and supplies KIT Dispense based on patient and insurance preference. Use up to four times daily as directed. (FOR ICD-9 250.00, 250.01). 1 each 0  . buPROPion (WELLBUTRIN SR) 150 MG 12 hr tablet Take 150 mg by mouth daily.  5  . diltiazem (CARDIZEM CD) 360 MG 24 hr capsule Take 1 capsule (360 mg total) by mouth daily. 90 capsule 0  . ELIQUIS 5 MG TABS tablet TAKE 1 TABLET BY MOUTH TWICE A DAY 180 tablet 1  . escitalopram (LEXAPRO) 20 MG tablet Take 40 mg by mouth daily.  3  . fexofenadine (ALLEGRA) 180 MG tablet Take 180 mg by mouth daily as needed for allergies or rhinitis.    . furosemide (LASIX) 80 MG tablet Take 1 tablet (80 mg total) by mouth daily. T  90 tablet 3  . indomethacin (INDOCIN) 50 MG capsule Take 1 capsule (50 mg total) by mouth 3 (three) times daily as needed (for gout flare ups). Reported on 12/27/2015 90 capsule 1  . insulin aspart (NOVOLOG) 100 UNIT/ML injection Inject 50 Units into the skin 3 (three) times daily as needed for high blood sugar.     . KLOR-CON M20 20 MEQ tablet TAKE 1 TABLET (20 MEQ TOTAL) BY MOUTH 2 (TWO) TIMES DAILY. NEEDS APPT FOR FURTHER REFILLS 60 tablet 0  . LEVEMIR 100 UNIT/ML injection Inject 50 Units into the skin at bedtime.   5  . metolazone (ZAROXOLYN) 2.5 MG tablet Take 1 tablet every Wednesday. 10 tablet 3  . nitroGLYCERIN (NITRODUR - DOSED IN MG/24 HR) 0.2 mg/hr patch PLACE 1 PATCH (0.2 MG TOTAL) ONTO THE SKIN DAILY. 90 patch  4  . pentoxifylline (TRENTAL) 400 MG CR tablet TAKE 1 TABLET BY MOUTH 3 TIMES DAILY WITH MEALS. 270 tablet 1  . simvastatin (ZOCOR) 20 MG tablet Take 20 mg by mouth every evening.  3  . tamsulosin (FLOMAX) 0.4 MG CAPS capsule Take 1 capsule (0.4 mg total) by mouth daily after breakfast. (Patient taking differently: Take 0.4 mg by mouth 2 (two) times daily. ) 30 capsule 0  . donepezil (ARICEPT) 10 MG tablet TAKE 1 TABLET BY MOUTH EVERYDAY AT BEDTIME 90 tablet 1  . apixaban (ELIQUIS) 5 MG TABS tablet TAKE 1 TABLET (5 MG TOTAL) BY MOUTH 2 (TWO) TIMES DAILY. 60 tablet 1  . doxycycline (VIBRA-TABS) 100 MG tablet Take 1 tablet (100 mg total) by mouth 2 (two) times daily. (Patient not taking: Reported on 01/17/2019) 28 tablet 0  . metFORMIN (GLUCOPHAGE-XR) 500 MG 24 hr tablet Take 500 mg by mouth 2 (two) times daily.     No facility-administered medications prior to visit.    PAST MEDICAL HISTORY: Past Medical History:  Diagnosis Date  . Anal fissure    h/o - no recent complications  . Arthritis    hips  . Atrial fibrillation ([place])   . Depression   . Diabetes mellitus without complication ([place])    improved after diet/exercise  . Enlarged prostate   . GERD (gastroesophageal reflux disease)   . Gout   . H/O hiatal hernia   . History of kidney stones   . Hx of typhoid fever    as a child  . Hypertension    improved after diet/exercise  . Memory change 04/14/2017  . OSA (obstructive sleep apnea)    wears cpap  . Pneumonia   . PONV (postoperative nausea and vomiting)    also difficulty waking up  . Stroke [facility]) 08/2013   short term memory loss (slight)    PAST SURGICAL HISTORY: Past Surgical History:  Procedure Laterality Date  . AMPUTATION Left 10/02/2017   Procedure: LEFT FOOT 1ST RAY AMPUTATION;  Surgeon: Newt Minion, MD;  Location: [facility];  Service: Orthopedics;  Laterality: Left;  . AMPUTATION Left 01/15/2018   Procedure: LEFT FOOT 2ND TOE AMPUTATION;  Surgeon: Newt Minion, MD;   Location: [facility];  Service: Orthopedics;  Laterality: Left;  . carpel tunnel Bilateral   . CATARACT EXTRACTION W/ INTRAOCULAR LENS  IMPLANT, BILATERAL    . CHOLECYSTECTOMY    . COLONOSCOPY    . EYE SURGERY     laser surgery   . I & D EXTREMITY Left 09/15/2014   Procedure: Excision Necrotic Left Achilles, Skin Graft, apply Wound VAC;  Surgeon: Meridee Score  V, MD;  Location: [place];  Service: Orthopedics;  Laterality: Left;  . KIDNEY STONE SURGERY    . KNEE ARTHROSCOPY Bilateral   . LIGAMENT REPAIR    . TONSILLECTOMY    . VASECTOMY      FAMILY HISTORY: Family History  Problem Relation Age of Onset  . COPD Father   . Heart attack Sister        died age 57 of MI    SOCIAL HISTORY: Social History   Socioeconomic History  . Marital status: Married    Spouse name: Sonny Masters"  . Number of children: 3  . Years of education: 7  . Highest education level: Not on file  Occupational History  . Occupation: Retired  Tobacco Use  . Smoking status: Never Smoker  . Smokeless tobacco: Never Used  Vaping Use  . Vaping Use: Never used  Substance and Sexual Activity  . Alcohol use: Yes    Alcohol/week: 0.0 standard drinks    Comment: rare - 1-2x /yr  . Drug use: No  . Sexual activity: Not on file  Other Topics Concern  . Not on file  Social History Narrative   Lives with wife   Caffeine use: Tea sometimes (unsweet)   Right handed    Social Determinants of Health   Financial Resource Strain:   . Difficulty of Paying Living Expenses:   Food Insecurity:   . Worried About Charity fundraiser in the Last Year:   . Arboriculturist in the Last Year:   Transportation Needs:   . Film/video editor (Medical):   Marland Kitchen Lack of Transportation (Non-Medical):   Physical Activity:   . Days of Exercise per Week:   . Minutes of Exercise per Session:   Stress:   . Feeling of Stress :   Social Connections:   . Frequency of Communication with Friends and Family:   . Frequency of Social  Gatherings with Friends and Family:   . Attends Religious Services:   . Active Member of Clubs or Organizations:   . Attends Archivist Meetings:   Marland Kitchen Marital Status:   Intimate Partner Violence:   . Fear of Current or Ex-Partner:   . Emotionally Abused:   Marland Kitchen Physically Abused:   . Sexually Abused:    PHYSICAL EXAM  Vitals:   02/08/20 1243  BP: 122/61  Pulse: 61  Weight: 291 lb (132 kg)  Height: '5\' 9"'$  (1.753 m)   Body mass index is 42.97 kg/m.  Generalized: Well developed, in no acute distress  MMSE - Mini Mental State Exam 02/08/2020 04/15/2018 04/14/2017  Orientation to time '4 5 3  '$ Orientation to Place '5 4 4  '$ Registration '3 3 3  '$ Attention/ Calculation '1 5 5  '$ Recall '3 2 3  '$ Language- name 2 objects '2 2 2  '$ Language- repeat '1 1 1  '$ Language- follow 3 step command '3 3 3  '$ Language- read & follow direction '1 1 1  '$ Write a sentence '1 1 1  '$ Copy design '1 1 1  '$ Total score '25 28 27    '$ Neurological examination  Mentation: Alert oriented to time, place, history taking. Follows all commands speech and language fluent, smiling, very pleasant, somewhat hard of hearing Cranial nerve II-XII: Pupils were equal round reactive to light. Extraocular movements were full, visual field were full on confrontational test. Facial sensation and strength were normal. Head turning and shoulder shrug  were normal and symmetric. Motor: The motor testing  reveals 5 over 5 strength of all 4 extremities. Good symmetric motor tone is noted throughout.  Sensory: Sensory testing is intact to soft touch on all 4 extremities. No evidence of extinction is noted.  Coordination: Cerebellar testing reveals good finger-nose-finger and heel-to-shin bilaterally.  Gait and station: Gait is base, but steady.  No assistive devices used. Reflexes: Deep tendon reflexes are symmetric and normal bilaterally.   DIAGNOSTIC DATA (LABS, IMAGING, TESTING) - I reviewed patient records, labs, notes, testing and imaging myself  where available.  Lab Results  Component Value Date   WBC 9.3 01/15/2018   HGB 12.0 (L) 01/15/2018   HCT 38.0 (L) 01/15/2018   MCV 90.0 01/15/2018   PLT 209 01/15/2018      Component Value Date/Time   NA 140 01/15/2018 1107   K 3.8 01/15/2018 1107   CL 104 01/15/2018 1107   CO2 23 01/15/2018 1107   GLUCOSE 218 (H) 01/15/2018 1107   BUN 41 (H) 01/15/2018 1107   CREATININE 1.67 (H) 01/15/2018 1107   CREATININE 1.57 (H) 06/04/2015 0958   CALCIUM 9.4 01/15/2018 1107   PROT 6.9 09/07/2016 2124   ALBUMIN 3.3 (L) 09/07/2016 2124   AST 200 (H) 09/07/2016 2124   ALT 98 (H) 09/07/2016 2124   ALKPHOS 44 09/07/2016 2124   BILITOT 1.0 09/07/2016 2124   GFRNONAA 39 (L) 01/15/2018 1107   GFRAA 46 (L) 01/15/2018 1107   Lab Results  Component Value Date   CHOL 200 08/07/2013   HDL 30 (L) 08/07/2013   LDLCALC 125 (H) 08/07/2013   TRIG 225 (H) 08/07/2013   CHOLHDL 6.7 08/07/2013   Lab Results  Component Value Date   HGBA1C 10.1 (H) 09/08/2016   Lab Results  Component Value Date   VITAMINB12 448 04/14/2017   Lab Results  Component Value Date   TSH 3.093 06/04/2015      ASSESSMENT AND PLAN 74 y.o. year old male  has a past medical history of Anal fissure, Arthritis, Atrial fibrillation ([place]), Depression, Diabetes mellitus without complication ([place]), Enlarged prostate, GERD (gastroesophageal reflux disease), Gout, H/O hiatal hernia, History of kidney stones, typhoid fever, Hypertension, Memory change (04/14/2017), OSA (obstructive sleep apnea), Pneumonia, PONV (postoperative nausea and vomiting), and Stroke ([place]) (08/2013). here with:  1.  Memory disturbance  He has remained overall stable, MMSE was 25/30 today. He will remain on Aricept 10 mg at bedtime.  We discussed Namenda, does not want to add on at this time.  He will continue follow-up with his primary doctor.  He will follow-up at this office in 1 year or sooner if needed.  I spent 30 minutes of face-to-face and  non-face-to-face time with patient.  This included previsit chart review, lab review, study review, order entry, electronic health record documentation, patient education.  Butler Denmark, AGNP-C, DNP 02/08/2020, 1:13 PM [facility] [address], [place], [place] [address] [phone]

## 2020-02-08 NOTE — Patient Instructions (Signed)
It was nice to see you today Continue Aricept at current dosing  See you back in 1 year or sooner if needed

## 2020-02-09 NOTE — Progress Notes (Signed)
I have read the note, and I agree with the clinical assessment and plan.  Oscar [name]   

## 2020-02-28 ENCOUNTER — Other Ambulatory Visit (HOSPITAL_COMMUNITY): Payer: Self-pay | Admitting: Internal Medicine

## 2020-03-22 ENCOUNTER — Other Ambulatory Visit (HOSPITAL_COMMUNITY): Payer: Self-pay | Admitting: Internal Medicine

## 2020-03-29 ENCOUNTER — Encounter (HOSPITAL_COMMUNITY): Payer: Medicare Other | Admitting: Internal Medicine

## 2020-03-30 ENCOUNTER — Telehealth: Payer: Self-pay | Admitting: Orthopedic Surgery

## 2020-03-30 NOTE — Telephone Encounter (Signed)
I called and lm on vm to advise that the pt has not been in the office in over a year and that he would nee devaluation to see if the medication needs to continue. To call and make an appt

## 2020-03-30 NOTE — Telephone Encounter (Signed)
Patient's daughter Katharine Look called. She would like to know if Dr. Sharol Given will be sending a RX for Pentoxfyolin-er 400mg ? Her call back number is [phone]

## 2020-04-09 DIAGNOSIS — E0840 Diabetes mellitus due to underlying condition with diabetic neuropathy, unspecified: Secondary | ICD-10-CM | POA: Diagnosis not present

## 2020-04-09 DIAGNOSIS — N400 Enlarged prostate without lower urinary tract symptoms: Secondary | ICD-10-CM | POA: Diagnosis not present

## 2020-04-09 DIAGNOSIS — E1165 Type 2 diabetes mellitus with hyperglycemia: Secondary | ICD-10-CM | POA: Diagnosis not present

## 2020-04-09 DIAGNOSIS — D509 Iron deficiency anemia, unspecified: Secondary | ICD-10-CM | POA: Diagnosis not present

## 2020-04-09 DIAGNOSIS — I4891 Unspecified atrial fibrillation: Secondary | ICD-10-CM | POA: Diagnosis not present

## 2020-04-09 DIAGNOSIS — E782 Mixed hyperlipidemia: Secondary | ICD-10-CM | POA: Diagnosis not present

## 2020-04-09 DIAGNOSIS — I509 Heart failure, unspecified: Secondary | ICD-10-CM | POA: Diagnosis not present

## 2020-04-09 DIAGNOSIS — F322 Major depressive disorder, single episode, severe without psychotic features: Secondary | ICD-10-CM | POA: Diagnosis not present

## 2020-04-09 DIAGNOSIS — I6789 Other cerebrovascular disease: Secondary | ICD-10-CM | POA: Diagnosis not present

## 2020-04-21 ENCOUNTER — Other Ambulatory Visit (HOSPITAL_COMMUNITY): Payer: Self-pay | Admitting: Internal Medicine

## 2020-05-07 ENCOUNTER — Other Ambulatory Visit (HOSPITAL_COMMUNITY): Payer: Self-pay | Admitting: Internal Medicine

## 2020-05-09 DIAGNOSIS — E08621 Diabetes mellitus due to underlying condition with foot ulcer: Secondary | ICD-10-CM | POA: Diagnosis not present

## 2020-05-09 DIAGNOSIS — I509 Heart failure, unspecified: Secondary | ICD-10-CM | POA: Diagnosis not present

## 2020-05-09 DIAGNOSIS — D509 Iron deficiency anemia, unspecified: Secondary | ICD-10-CM | POA: Diagnosis not present

## 2020-05-09 DIAGNOSIS — I4891 Unspecified atrial fibrillation: Secondary | ICD-10-CM | POA: Diagnosis not present

## 2020-05-09 DIAGNOSIS — E11319 Type 2 diabetes mellitus with unspecified diabetic retinopathy without macular edema: Secondary | ICD-10-CM | POA: Diagnosis not present

## 2020-05-09 DIAGNOSIS — I6789 Other cerebrovascular disease: Secondary | ICD-10-CM | POA: Diagnosis not present

## 2020-05-09 DIAGNOSIS — E0840 Diabetes mellitus due to underlying condition with diabetic neuropathy, unspecified: Secondary | ICD-10-CM | POA: Diagnosis not present

## 2020-05-09 DIAGNOSIS — E1165 Type 2 diabetes mellitus with hyperglycemia: Secondary | ICD-10-CM | POA: Diagnosis not present

## 2020-05-09 DIAGNOSIS — F322 Major depressive disorder, single episode, severe without psychotic features: Secondary | ICD-10-CM | POA: Diagnosis not present

## 2020-05-09 DIAGNOSIS — E782 Mixed hyperlipidemia: Secondary | ICD-10-CM | POA: Diagnosis not present

## 2020-05-09 DIAGNOSIS — N400 Enlarged prostate without lower urinary tract symptoms: Secondary | ICD-10-CM | POA: Diagnosis not present

## 2020-05-25 ENCOUNTER — Ambulatory Visit (HOSPITAL_COMMUNITY)
Admission: RE | Admit: 2020-05-25 | Discharge: 2020-05-25 | Disposition: A | Discharge status: Home / Self Care | Payer: Medicare Other | Source: Ambulatory Visit | Attending: Internal Medicine | Admitting: Internal Medicine

## 2020-05-25 ENCOUNTER — Other Ambulatory Visit: Payer: Self-pay

## 2020-05-25 ENCOUNTER — Encounter (HOSPITAL_COMMUNITY): Payer: Self-pay | Admitting: Internal Medicine

## 2020-05-25 VITALS — BP 140/80 | HR 102 | Ht 69.0 in | Wt 289.8 lb

## 2020-05-25 DIAGNOSIS — I482 Chronic atrial fibrillation, unspecified: Secondary | ICD-10-CM | POA: Diagnosis not present

## 2020-05-25 DIAGNOSIS — Z794 Long term (current) use of insulin: Secondary | ICD-10-CM | POA: Diagnosis not present

## 2020-05-25 DIAGNOSIS — Z8673 Personal history of transient ischemic attack (TIA), and cerebral infarction without residual deficits: Secondary | ICD-10-CM | POA: Insufficient documentation

## 2020-05-25 DIAGNOSIS — Z79899 Other long term (current) drug therapy: Secondary | ICD-10-CM | POA: Diagnosis not present

## 2020-05-25 DIAGNOSIS — Z888 Allergy status to other drugs, medicaments and biological substances status: Secondary | ICD-10-CM | POA: Diagnosis not present

## 2020-05-25 DIAGNOSIS — E119 Type 2 diabetes mellitus without complications: Secondary | ICD-10-CM | POA: Diagnosis not present

## 2020-05-25 DIAGNOSIS — I110 Hypertensive heart disease with heart failure: Secondary | ICD-10-CM | POA: Diagnosis not present

## 2020-05-25 DIAGNOSIS — Z6841 Body Mass Index (BMI) 40.0 and over, adult: Secondary | ICD-10-CM | POA: Insufficient documentation

## 2020-05-25 DIAGNOSIS — Z8249 Family history of ischemic heart disease and other diseases of the circulatory system: Secondary | ICD-10-CM | POA: Insufficient documentation

## 2020-05-25 DIAGNOSIS — I5032 Chronic diastolic (congestive) heart failure: Secondary | ICD-10-CM | POA: Diagnosis not present

## 2020-05-25 DIAGNOSIS — I2510 Atherosclerotic heart disease of native coronary artery without angina pectoris: Secondary | ICD-10-CM

## 2020-05-25 DIAGNOSIS — G4733 Obstructive sleep apnea (adult) (pediatric): Secondary | ICD-10-CM | POA: Diagnosis not present

## 2020-05-25 DIAGNOSIS — K219 Gastro-esophageal reflux disease without esophagitis: Secondary | ICD-10-CM | POA: Insufficient documentation

## 2020-05-25 MED ORDER — EMPAGLIFLOZIN 10 MG PO TABS: 10.0000 mg | ORAL_TABLET | Freq: Every day | ORAL | 11 refills | Status: DC

## 2020-05-25 NOTE — Addendum Note (Signed)
Encounter addended by: Jolaine Artist, MD on: 05/25/2020 1:16 PM  Actions taken: Charge Capture section accepted

## 2020-05-25 NOTE — Progress Notes (Signed)
Dictation #1 [id number]  [id number] Patient ID: K[place]ak Rollyson., male   DOB: January 22, 1946, 74 y.o.   MRN: [id number]    Advanced Heart Failure Clinic Note   Referring Physician: Lovena Le   HPI: CORMAC WINT is a 73 y.o. male with h/o morbid obesity, DM2, HTN, permanent AF, OSA on CPAP and diastolic HF. Referred by Dr. Lovena Le for further evaluation of worsening dyspnea.   Developed PAF several years ago. He has been in AF since that time and has been relatively asymptomatic. February 2016 had a diabetic foot ulcer on the right heel with gangrene. Was operated on by Dr. Sharol Given and had limited mobility for over 8 months with only bed to chair activity.   He presents today for regular follow up. We have not seen them for 2-3 years. Says he feels ok. Getting around ok. Had severe SOB a month or so ago and improved with changing his diet. Now much improved. No CP. No edema, orthopnea or PND. Trying to follow low-carb diet. Taking lasix 80 daily. Takes metolazone about once a week.    Studies: Last echo 11/16 - Left ventricle: The cavity size was normal. Systolic function was normal. The estimated ejection fraction was in the range of 55% to 60%. Wall motion was normal; there were no regional wall motion abnormalities. - Aortic valve: There was trivial regurgitation. - Mitral valve: Calcified annulus. There was mild regurgitation. - Left atrium: The atrium was moderately dilated. - Right atrium: The atrium was mildly dilated. - Atrial septum: No defect or patent foramen ovale was identified. - Pulmonary arteries: PA peak pressure: 37 mm Hg (S).  CPX 2/17  FVC 2.11 (53%)    FEV1 1.55 (50%)     FEV1/FVC 74 (95%)     MVV 76 (61%)  Resting HR: 107 Peak HR: 136  (91% age predicted max HR) BP rest: 136/70 BP peak: 146/68 Peak VO2: 10.4 (56% predicted peak VO2) VE/VCO2 slope: 29.8 OUES: 1 Peak RER: 1.14 Ventilatory Threshold: 7.9 (42.5% predicted or measured  peak VO2) Peak RR 24 Peak Ventilation: 45.9 VE/MVV: 60% PETCO2 at peak: 36 O2pulse: 10  (59% predicted O2pulse)    Past Medical History:  Diagnosis Date  . Anal fissure    h/o - no recent complications  . Arthritis    hips  . Atrial fibrillation ([place])   . Depression   . Diabetes mellitus without complication ([place])    improved after diet/exercise  . Enlarged prostate   . GERD (gastroesophageal reflux disease)   . Gout   . H/O hiatal hernia   . History of kidney stones   . Hx of typhoid fever    as a child  . Hypertension    improved after diet/exercise  . Memory change 04/14/2017  . OSA (obstructive sleep apnea)    wears cpap  . Pneumonia   . PONV (postoperative nausea and vomiting)    also difficulty waking up  . Stroke [facility]) 08/2013   short term memory loss (slight)    Current Outpatient Medications  Medication Sig Dispense Refill  . acetaminophen (TYLENOL) 500 MG tablet Take 1,000 mg by mouth 2 (two) times daily as needed for moderate pain.     Marland Kitchen allopurinol (ZYLOPRIM) 300 MG tablet Take 300 mg by mouth daily.  11  . B-D INS SYRINGE 0.5CC/30GX1/2" 30G X 1/2" 0.5 ML MISC USE THREE TIMES A DAY DX-E11.65 INJECTION  2  . blood glucose meter kit and supplies KIT Dispense based on  patient and insurance preference. Use up to four times daily as directed. (FOR ICD-9 250.00, 250.01). 1 each 0  . buPROPion (WELLBUTRIN SR) 150 MG 12 hr tablet Take 150 mg by mouth daily.  5  . diltiazem (CARDIZEM CD) 360 MG 24 hr capsule Take 1 capsule (360 mg total) by mouth daily. 90 capsule 0  . ELIQUIS 5 MG TABS tablet TAKE 1 TABLET BY MOUTH TWICE A DAY 180 tablet 1  . escitalopram (LEXAPRO) 20 MG tablet Take 40 mg by mouth daily.  3  . furosemide (LASIX) 80 MG tablet Take 1 tablet (80 mg total) by mouth daily. T 90 tablet 3  . indomethacin (INDOCIN) 50 MG capsule Take 1 capsule (50 mg total) by mouth 3 (three) times daily as needed (for gout flare ups). Reported on 12/27/2015 90 capsule  1  . insulin aspart (NOVOLOG) 100 UNIT/ML injection Inject 50 Units into the skin 3 (three) times daily as needed for high blood sugar.     . KLOR-CON M20 20 MEQ tablet TAKE 1 TABLET BY MOUTH TWICE A DAY 180 tablet 1  . LEVEMIR 100 UNIT/ML injection Inject 50 Units into the skin at bedtime.   5  . metolazone (ZAROXOLYN) 2.5 MG tablet Take 1 tablet every Wednesday. 10 tablet 3  . simvastatin (ZOCOR) 20 MG tablet Take 20 mg by mouth every evening.  3  . tamsulosin (FLOMAX) 0.4 MG CAPS capsule Take 1 capsule (0.4 mg total) by mouth daily after breakfast. (Patient taking differently: Take 0.4 mg by mouth 2 (two) times daily. ) 30 capsule 0  . donepezil (ARICEPT) 10 MG tablet Take 1 at bedtime (Patient not taking: Reported on 05/25/2020) 90 tablet 3  . pentoxifylline (TRENTAL) 400 MG CR tablet TAKE 1 TABLET BY MOUTH 3 TIMES DAILY WITH MEALS. (Patient not taking: Reported on 05/25/2020) 270 tablet 1   No current facility-administered medications for this encounter.    Allergies  Allergen Reactions  . Iohexol Hives and Other (See Comments)     Desc: HIVES S/P 13HR.PREMEDS, ?LOW DOSAGE PREMEDS   . Ivp Dye [Iodinated Diagnostic Agents] Hives and Other (See Comments)    welps       Social History   Socioeconomic History  . Marital status: Married    Spouse name: Sonny Masters"  . Number of children: 3  . Years of education: 5  . Highest education level: Not on file  Occupational History  . Occupation: Retired  Tobacco Use  . Smoking status: Never Smoker  . Smokeless tobacco: Never Used  Vaping Use  . Vaping Use: Never used  Substance and Sexual Activity  . Alcohol use: Yes    Alcohol/week: 0.0 standard drinks    Comment: rare - 1-2x /yr  . Drug use: No  . Sexual activity: Not on file  Other Topics Concern  . Not on file  Social History Narrative   Lives with wife   Caffeine use: Tea sometimes (unsweet)   Right handed    Social Determinants of Health   Financial Resource  Strain:   . Difficulty of Paying Living Expenses: Not on file  Food Insecurity:   . Worried About Charity fundraiser in the Last Year: Not on file  . Ran Out of Food in the Last Year: Not on file  Transportation Needs:   . Lack of Transportation (Medical): Not on file  . Lack of Transportation (Non-Medical): Not on file  Physical Activity:   . Days of Exercise  per Week: Not on file  . Minutes of Exercise per Session: Not on file  Stress:   . Feeling of Stress : Not on file  Social Connections:   . Frequency of Communication with Friends and Family: Not on file  . Frequency of Social Gatherings with Friends and Family: Not on file  . Attends Religious Services: Not on file  . Active Member of Clubs or Organizations: Not on file  . Attends Archivist Meetings: Not on file  . Marital Status: Not on file  Intimate Partner Violence:   . Fear of Current or Ex-Partner: Not on file  . Emotionally Abused: Not on file  . Physically Abused: Not on file  . Sexually Abused: Not on file      Family History  Problem Relation Age of Onset  . COPD Father   . Heart attack Sister        died age 75 of MI    Vitals:   05/25/20 1107  BP: 140/80  Pulse: (!) 102  SpO2: 94%  Weight: 131.5 kg (289 lb 12.8 oz)  Height: $Remove'5\' 9"'wEnZoOG$  (1.753 m)   Wt Readings from Last 3 Encounters:  05/25/20 131.5 kg (289 lb 12.8 oz)  02/08/20 132 kg (291 lb)  09/13/18 130.2 kg (287 lb)     PHYSICAL EXAM: General:  Morbidly obese male. No resp difficulty HEENT: normal Neck: supple. no JVD. Carotids 2+ bilat; no bruits. No lymphadenopathy or thryomegaly appreciated. Cor: PMI nondisplaced. irregular rate & rhythm. No rubs, gallops or murmurs. Lungs: clear Abdomen: obese soft, nontender, nondistended. No hepatosplenomegaly. No bruits or masses. Good bowel sounds. Extremities: no cyanosis, clubbing, rash, tr-1+ edema on LLE Neuro: alert & orientedx3, cranial nerves grossly intact. moves all 4 extremities  w/o difficulty. Affect pleasant    ASSESSMENT & PLAN: 1. Chronic diastolic HF - Echo 55/3748 55-60%, Trivial AI, Mild MR, Mod LAE, Mild RAE. PA peak pressure 37 mm Hg.  - Stable NYHA II symptoms - Volume status ok  - Continue lasix 80 mg daily - Takes metolazone as needed, approx 1x/week  - Due for repeat echo  - With CAD, DM and HFPEF will start Jardiance $RemoveBeforeD'10mg'uWoENEBtGcuoAZ$  daily. Likely will need to cut back metolazone dosing 2. Morbid obesity Body mass index is 42.8 kg/m. - Continue low-carb diet for weight loss 3. DM2 - Per primary.  - s/p Left great toe amputation by Dr. Sharol Given - Start Jardiance $RemoveBeforeD'10mg'GqvCYJiUBjpNnH$  for CV risk reduction - he will follow up with Dr. Inda Merlin about this 4. Diffuse coronary calcifications on CT chest - No s/s of ischemia - Starting Jardiance as above - continue Eliquis and statin 5. Chronic AF - Rate up slightly up on ECG. - Follows with Dr. Lovena Le - Continue eliquis 5 mg BID.  - Have asked him to get FitBit to track HR to ensure adequacy of rate control  6. HTN - On diltiazem 360 mg daily.   Glori Bickers, MD  11:55 AM

## 2020-05-25 NOTE — Patient Instructions (Signed)
START Jardiance 10mg , one tab daily  Your physician has requested that you have an echocardiogram. Echocardiography is a painless test that uses sound waves to create images of your heart. It provides your doctor with information about the size and shape of your heart and how well your hearts chambers and valves are working. This procedure takes approximately one hour. There are no restrictions for this procedure.  Be sure to buy a FitBit to follow your heart rate.  Your physician recommends that you schedule a follow-up appointment in: 12 months with Dr Haroldine Laws  If you have any questions or concerns before your next appointment please send [name] a message through [place] or call our office at [phone].    TO LEAVE A MESSAGE FOR THE NURSE SELECT OPTION 2, PLEASE LEAVE A MESSAGE INCLUDING:  YOUR NAME  DATE OF BIRTH  CALL BACK NUMBER  REASON FOR CALL**this is important as we prioritize the call backs  YOU WILL RECEIVE A CALL BACK THE SAME DAY AS LONG AS YOU CALL BEFORE 4:00 PM

## 2020-06-04 DIAGNOSIS — I509 Heart failure, unspecified: Secondary | ICD-10-CM | POA: Diagnosis not present

## 2020-06-04 DIAGNOSIS — R413 Other amnesia: Secondary | ICD-10-CM | POA: Diagnosis not present

## 2020-06-04 DIAGNOSIS — D6869 Other thrombophilia: Secondary | ICD-10-CM | POA: Diagnosis not present

## 2020-06-04 DIAGNOSIS — I4891 Unspecified atrial fibrillation: Secondary | ICD-10-CM | POA: Diagnosis not present

## 2020-06-04 DIAGNOSIS — E11319 Type 2 diabetes mellitus with unspecified diabetic retinopathy without macular edema: Secondary | ICD-10-CM | POA: Diagnosis not present

## 2020-06-04 DIAGNOSIS — M7582 Other shoulder lesions, left shoulder: Secondary | ICD-10-CM | POA: Diagnosis not present

## 2020-06-04 DIAGNOSIS — G4733 Obstructive sleep apnea (adult) (pediatric): Secondary | ICD-10-CM | POA: Diagnosis not present

## 2020-06-04 DIAGNOSIS — E1165 Type 2 diabetes mellitus with hyperglycemia: Secondary | ICD-10-CM | POA: Diagnosis not present

## 2020-06-04 DIAGNOSIS — F322 Major depressive disorder, single episode, severe without psychotic features: Secondary | ICD-10-CM | POA: Diagnosis not present

## 2020-06-04 DIAGNOSIS — E0840 Diabetes mellitus due to underlying condition with diabetic neuropathy, unspecified: Secondary | ICD-10-CM | POA: Diagnosis not present

## 2020-06-04 DIAGNOSIS — D509 Iron deficiency anemia, unspecified: Secondary | ICD-10-CM | POA: Diagnosis not present

## 2020-06-07 DIAGNOSIS — I509 Heart failure, unspecified: Secondary | ICD-10-CM | POA: Diagnosis not present

## 2020-06-07 DIAGNOSIS — E1165 Type 2 diabetes mellitus with hyperglycemia: Secondary | ICD-10-CM | POA: Diagnosis not present

## 2020-06-07 DIAGNOSIS — E782 Mixed hyperlipidemia: Secondary | ICD-10-CM | POA: Diagnosis not present

## 2020-06-07 DIAGNOSIS — N400 Enlarged prostate without lower urinary tract symptoms: Secondary | ICD-10-CM | POA: Diagnosis not present

## 2020-06-07 DIAGNOSIS — F322 Major depressive disorder, single episode, severe without psychotic features: Secondary | ICD-10-CM | POA: Diagnosis not present

## 2020-06-07 DIAGNOSIS — E11311 Type 2 diabetes mellitus with unspecified diabetic retinopathy with macular edema: Secondary | ICD-10-CM | POA: Diagnosis not present

## 2020-06-07 DIAGNOSIS — D509 Iron deficiency anemia, unspecified: Secondary | ICD-10-CM | POA: Diagnosis not present

## 2020-06-07 DIAGNOSIS — I4891 Unspecified atrial fibrillation: Secondary | ICD-10-CM | POA: Diagnosis not present

## 2020-06-07 DIAGNOSIS — I6789 Other cerebrovascular disease: Secondary | ICD-10-CM | POA: Diagnosis not present

## 2020-07-23 DIAGNOSIS — F322 Major depressive disorder, single episode, severe without psychotic features: Secondary | ICD-10-CM | POA: Diagnosis not present

## 2020-07-23 DIAGNOSIS — E782 Mixed hyperlipidemia: Secondary | ICD-10-CM | POA: Diagnosis not present

## 2020-07-23 DIAGNOSIS — N400 Enlarged prostate without lower urinary tract symptoms: Secondary | ICD-10-CM | POA: Diagnosis not present

## 2020-07-23 DIAGNOSIS — I509 Heart failure, unspecified: Secondary | ICD-10-CM | POA: Diagnosis not present

## 2020-07-23 DIAGNOSIS — D509 Iron deficiency anemia, unspecified: Secondary | ICD-10-CM | POA: Diagnosis not present

## 2020-07-23 DIAGNOSIS — I6789 Other cerebrovascular disease: Secondary | ICD-10-CM | POA: Diagnosis not present

## 2020-07-23 DIAGNOSIS — E1165 Type 2 diabetes mellitus with hyperglycemia: Secondary | ICD-10-CM | POA: Diagnosis not present

## 2020-07-23 DIAGNOSIS — E08621 Diabetes mellitus due to underlying condition with foot ulcer: Secondary | ICD-10-CM | POA: Diagnosis not present

## 2020-07-23 DIAGNOSIS — E11319 Type 2 diabetes mellitus with unspecified diabetic retinopathy without macular edema: Secondary | ICD-10-CM | POA: Diagnosis not present

## 2020-07-23 DIAGNOSIS — I4891 Unspecified atrial fibrillation: Secondary | ICD-10-CM | POA: Diagnosis not present

## 2020-07-23 DIAGNOSIS — K219 Gastro-esophageal reflux disease without esophagitis: Secondary | ICD-10-CM | POA: Diagnosis not present

## 2020-07-23 DIAGNOSIS — E0840 Diabetes mellitus due to underlying condition with diabetic neuropathy, unspecified: Secondary | ICD-10-CM | POA: Diagnosis not present

## 2020-08-08 DIAGNOSIS — F322 Major depressive disorder, single episode, severe without psychotic features: Secondary | ICD-10-CM | POA: Diagnosis not present

## 2020-08-08 DIAGNOSIS — D6869 Other thrombophilia: Secondary | ICD-10-CM | POA: Diagnosis not present

## 2020-08-08 DIAGNOSIS — G4733 Obstructive sleep apnea (adult) (pediatric): Secondary | ICD-10-CM | POA: Diagnosis not present

## 2020-08-08 DIAGNOSIS — I6789 Other cerebrovascular disease: Secondary | ICD-10-CM | POA: Diagnosis not present

## 2020-08-08 DIAGNOSIS — I509 Heart failure, unspecified: Secondary | ICD-10-CM | POA: Diagnosis not present

## 2020-08-08 DIAGNOSIS — E11319 Type 2 diabetes mellitus with unspecified diabetic retinopathy without macular edema: Secondary | ICD-10-CM | POA: Diagnosis not present

## 2020-08-08 DIAGNOSIS — E08621 Diabetes mellitus due to underlying condition with foot ulcer: Secondary | ICD-10-CM | POA: Diagnosis not present

## 2020-08-08 DIAGNOSIS — D509 Iron deficiency anemia, unspecified: Secondary | ICD-10-CM | POA: Diagnosis not present

## 2020-08-08 DIAGNOSIS — E1165 Type 2 diabetes mellitus with hyperglycemia: Secondary | ICD-10-CM | POA: Diagnosis not present

## 2020-08-08 DIAGNOSIS — E0840 Diabetes mellitus due to underlying condition with diabetic neuropathy, unspecified: Secondary | ICD-10-CM | POA: Diagnosis not present

## 2020-08-08 DIAGNOSIS — I4891 Unspecified atrial fibrillation: Secondary | ICD-10-CM | POA: Diagnosis not present

## 2020-08-08 DIAGNOSIS — Z7984 Long term (current) use of oral hypoglycemic drugs: Secondary | ICD-10-CM | POA: Diagnosis not present

## 2020-09-07 ENCOUNTER — Telehealth (HOSPITAL_COMMUNITY): Payer: Self-pay | Admitting: Family

## 2020-09-07 NOTE — Telephone Encounter (Signed)
Called to discuss with [name]. about Covid symptoms and the use of casirivimab/imdevimab, a combination monoclonal antibody infusion for those with mild to moderate Covid symptoms and at a high risk of hospitalization. His daughter states she handles his medical calls.  She shares that his symptoms began 09/04/2020, and his doctor referred him to the [place] clinic. She states she understands he is a candidate for treatment, but also shares he is feeling much better.     Pt is qualified for this infusion infusion center due to co-morbid conditions and/or a member of an at-risk group, however daughter declines infusion at this time.   Symptoms reviewed that would warrant ED/Hospital evaluation.She  verbalized understanding. She was  advised to call back if he decides that he does want to get infusion. Callback number to the infusion center given. Patient advised to go to Urgent care or ED with severe symptoms. Last date he would be eligible for infusion is 09/06/2020.     Patient Active Problem List   Diagnosis Date Noted  . Pain in left foot 04/21/2018  . PVD (peripheral vascular disease) ([place]) 04/21/2018  . Amputated toe, left ([place]) 01/21/2018  . Achilles tendon contracture, left 12/28/2017  . Dehiscence of amputation stump ([place]) 11/10/2017  . Great toe amputation status, left 11/10/2017  . Memory change 04/14/2017  . Cutaneous abscess of left foot 04/09/2017  . Non-pressure chronic ulcer of right heel and midfoot limited to breakdown of skin ([place]) 10/07/2016  . Influenza A   . AKI (acute kidney injury) ([place]) 09/07/2016  . Acute encephalopathy 09/07/2016  . Acute respiratory failure with hypoxia ([place]) 09/07/2016  . Elevated lactic acid level 09/07/2016  . Elevated troponin 09/07/2016  . Coronary artery calcification seen on CT scan 10/28/2015  . Acute on chronic diastolic (congestive) heart failure ([place]) 09/26/2015  . OSA (obstructive sleep apnea) 02/07/2015  . Sepsis ([place]) 08/28/2014   . Chronic diastolic heart failure ([place]) 09/21/2013  . CVA (cerebral infarction) 08/08/2013  . Dizziness 08/07/2013  . TIA (transient ischemic attack) 08/07/2013  . Atrial fibrillation ([place]) 08/07/2013  . Hypertension 08/07/2013  . Anemia 08/07/2013  . Dyspnea 06/21/2013  . Chest pain 11/16/2012  . Atrial fibrillation with RVR ([place]) 11/16/2012  . Gout 11/16/2012  . Diabetes mellitus type 2, uncontrolled, with complications ([place]) 56/31/4970  . CKD (chronic kidney disease), stage II 11/16/2012  . Atypical atrial flutter ([place]) 11/16/2012  . Pleuritic chest pain 11/16/2012  . Morbid obesity ([place]) 11/16/2012  . Obstructive sleep apnea 11/16/2012    Agustina Witzke,NP

## 2020-09-12 DIAGNOSIS — E118 Type 2 diabetes mellitus with unspecified complications: Secondary | ICD-10-CM | POA: Diagnosis not present

## 2020-09-12 DIAGNOSIS — I5032 Chronic diastolic (congestive) heart failure: Secondary | ICD-10-CM | POA: Diagnosis not present

## 2020-09-12 DIAGNOSIS — Z119 Encounter for screening for infectious and parasitic diseases, unspecified: Secondary | ICD-10-CM | POA: Diagnosis not present

## 2020-10-08 DIAGNOSIS — E1165 Type 2 diabetes mellitus with hyperglycemia: Secondary | ICD-10-CM | POA: Diagnosis not present

## 2020-10-08 DIAGNOSIS — E0840 Diabetes mellitus due to underlying condition with diabetic neuropathy, unspecified: Secondary | ICD-10-CM | POA: Diagnosis not present

## 2020-10-08 DIAGNOSIS — I509 Heart failure, unspecified: Secondary | ICD-10-CM | POA: Diagnosis not present

## 2020-10-08 DIAGNOSIS — E782 Mixed hyperlipidemia: Secondary | ICD-10-CM | POA: Diagnosis not present

## 2020-10-08 DIAGNOSIS — K219 Gastro-esophageal reflux disease without esophagitis: Secondary | ICD-10-CM | POA: Diagnosis not present

## 2020-10-08 DIAGNOSIS — I6789 Other cerebrovascular disease: Secondary | ICD-10-CM | POA: Diagnosis not present

## 2020-10-08 DIAGNOSIS — F322 Major depressive disorder, single episode, severe without psychotic features: Secondary | ICD-10-CM | POA: Diagnosis not present

## 2020-10-08 DIAGNOSIS — N400 Enlarged prostate without lower urinary tract symptoms: Secondary | ICD-10-CM | POA: Diagnosis not present

## 2020-10-08 DIAGNOSIS — I4891 Unspecified atrial fibrillation: Secondary | ICD-10-CM | POA: Diagnosis not present

## 2020-10-08 DIAGNOSIS — E11319 Type 2 diabetes mellitus with unspecified diabetic retinopathy without macular edema: Secondary | ICD-10-CM | POA: Diagnosis not present

## 2020-10-08 DIAGNOSIS — D509 Iron deficiency anemia, unspecified: Secondary | ICD-10-CM | POA: Diagnosis not present

## 2020-10-12 DIAGNOSIS — E782 Mixed hyperlipidemia: Secondary | ICD-10-CM | POA: Diagnosis not present

## 2020-10-12 DIAGNOSIS — E1165 Type 2 diabetes mellitus with hyperglycemia: Secondary | ICD-10-CM | POA: Diagnosis not present

## 2020-10-12 DIAGNOSIS — I6789 Other cerebrovascular disease: Secondary | ICD-10-CM | POA: Diagnosis not present

## 2020-10-12 DIAGNOSIS — N400 Enlarged prostate without lower urinary tract symptoms: Secondary | ICD-10-CM | POA: Diagnosis not present

## 2020-10-12 DIAGNOSIS — I4891 Unspecified atrial fibrillation: Secondary | ICD-10-CM | POA: Diagnosis not present

## 2020-10-12 DIAGNOSIS — E0840 Diabetes mellitus due to underlying condition with diabetic neuropathy, unspecified: Secondary | ICD-10-CM | POA: Diagnosis not present

## 2020-10-12 DIAGNOSIS — K219 Gastro-esophageal reflux disease without esophagitis: Secondary | ICD-10-CM | POA: Diagnosis not present

## 2020-10-12 DIAGNOSIS — D509 Iron deficiency anemia, unspecified: Secondary | ICD-10-CM | POA: Diagnosis not present

## 2020-10-12 DIAGNOSIS — E08621 Diabetes mellitus due to underlying condition with foot ulcer: Secondary | ICD-10-CM | POA: Diagnosis not present

## 2020-10-12 DIAGNOSIS — I509 Heart failure, unspecified: Secondary | ICD-10-CM | POA: Diagnosis not present

## 2020-10-12 DIAGNOSIS — F322 Major depressive disorder, single episode, severe without psychotic features: Secondary | ICD-10-CM | POA: Diagnosis not present

## 2020-10-12 DIAGNOSIS — E11319 Type 2 diabetes mellitus with unspecified diabetic retinopathy without macular edema: Secondary | ICD-10-CM | POA: Diagnosis not present

## 2020-11-27 DIAGNOSIS — G4733 Obstructive sleep apnea (adult) (pediatric): Secondary | ICD-10-CM | POA: Diagnosis not present

## 2020-11-27 DIAGNOSIS — D6869 Other thrombophilia: Secondary | ICD-10-CM | POA: Diagnosis not present

## 2020-11-27 DIAGNOSIS — E11319 Type 2 diabetes mellitus with unspecified diabetic retinopathy without macular edema: Secondary | ICD-10-CM | POA: Diagnosis not present

## 2020-11-27 DIAGNOSIS — R413 Other amnesia: Secondary | ICD-10-CM | POA: Diagnosis not present

## 2020-11-27 DIAGNOSIS — M7582 Other shoulder lesions, left shoulder: Secondary | ICD-10-CM | POA: Diagnosis not present

## 2020-11-27 DIAGNOSIS — E08621 Diabetes mellitus due to underlying condition with foot ulcer: Secondary | ICD-10-CM | POA: Diagnosis not present

## 2020-11-27 DIAGNOSIS — I509 Heart failure, unspecified: Secondary | ICD-10-CM | POA: Diagnosis not present

## 2020-11-27 DIAGNOSIS — E782 Mixed hyperlipidemia: Secondary | ICD-10-CM | POA: Diagnosis not present

## 2020-11-27 DIAGNOSIS — Z0000 Encounter for general adult medical examination without abnormal findings: Secondary | ICD-10-CM | POA: Diagnosis not present

## 2020-11-27 DIAGNOSIS — I6789 Other cerebrovascular disease: Secondary | ICD-10-CM | POA: Diagnosis not present

## 2020-11-27 DIAGNOSIS — E559 Vitamin D deficiency, unspecified: Secondary | ICD-10-CM | POA: Diagnosis not present

## 2020-11-27 DIAGNOSIS — I4891 Unspecified atrial fibrillation: Secondary | ICD-10-CM | POA: Diagnosis not present

## 2020-11-27 DIAGNOSIS — E0840 Diabetes mellitus due to underlying condition with diabetic neuropathy, unspecified: Secondary | ICD-10-CM | POA: Diagnosis not present

## 2020-11-27 DIAGNOSIS — E1165 Type 2 diabetes mellitus with hyperglycemia: Secondary | ICD-10-CM | POA: Diagnosis not present

## 2020-12-05 DIAGNOSIS — D509 Iron deficiency anemia, unspecified: Secondary | ICD-10-CM | POA: Diagnosis not present

## 2020-12-05 DIAGNOSIS — I4891 Unspecified atrial fibrillation: Secondary | ICD-10-CM | POA: Diagnosis not present

## 2020-12-05 DIAGNOSIS — E1165 Type 2 diabetes mellitus with hyperglycemia: Secondary | ICD-10-CM | POA: Diagnosis not present

## 2020-12-05 DIAGNOSIS — N400 Enlarged prostate without lower urinary tract symptoms: Secondary | ICD-10-CM | POA: Diagnosis not present

## 2020-12-05 DIAGNOSIS — I6789 Other cerebrovascular disease: Secondary | ICD-10-CM | POA: Diagnosis not present

## 2020-12-05 DIAGNOSIS — E11319 Type 2 diabetes mellitus with unspecified diabetic retinopathy without macular edema: Secondary | ICD-10-CM | POA: Diagnosis not present

## 2020-12-05 DIAGNOSIS — K219 Gastro-esophageal reflux disease without esophagitis: Secondary | ICD-10-CM | POA: Diagnosis not present

## 2020-12-05 DIAGNOSIS — E782 Mixed hyperlipidemia: Secondary | ICD-10-CM | POA: Diagnosis not present

## 2020-12-05 DIAGNOSIS — I509 Heart failure, unspecified: Secondary | ICD-10-CM | POA: Diagnosis not present

## 2020-12-05 DIAGNOSIS — F322 Major depressive disorder, single episode, severe without psychotic features: Secondary | ICD-10-CM | POA: Diagnosis not present

## 2021-01-08 DIAGNOSIS — E1165 Type 2 diabetes mellitus with hyperglycemia: Secondary | ICD-10-CM | POA: Diagnosis not present

## 2021-01-08 DIAGNOSIS — F322 Major depressive disorder, single episode, severe without psychotic features: Secondary | ICD-10-CM | POA: Diagnosis not present

## 2021-01-08 DIAGNOSIS — I509 Heart failure, unspecified: Secondary | ICD-10-CM | POA: Diagnosis not present

## 2021-01-08 DIAGNOSIS — E782 Mixed hyperlipidemia: Secondary | ICD-10-CM | POA: Diagnosis not present

## 2021-01-08 DIAGNOSIS — N400 Enlarged prostate without lower urinary tract symptoms: Secondary | ICD-10-CM | POA: Diagnosis not present

## 2021-01-08 DIAGNOSIS — E1151 Type 2 diabetes mellitus with diabetic peripheral angiopathy without gangrene: Secondary | ICD-10-CM | POA: Diagnosis not present

## 2021-01-08 DIAGNOSIS — K219 Gastro-esophageal reflux disease without esophagitis: Secondary | ICD-10-CM | POA: Diagnosis not present

## 2021-01-08 DIAGNOSIS — I4891 Unspecified atrial fibrillation: Secondary | ICD-10-CM | POA: Diagnosis not present

## 2021-01-08 DIAGNOSIS — E0840 Diabetes mellitus due to underlying condition with diabetic neuropathy, unspecified: Secondary | ICD-10-CM | POA: Diagnosis not present

## 2021-01-08 DIAGNOSIS — E11319 Type 2 diabetes mellitus with unspecified diabetic retinopathy without macular edema: Secondary | ICD-10-CM | POA: Diagnosis not present

## 2021-01-31 ENCOUNTER — Other Ambulatory Visit (HOSPITAL_COMMUNITY): Payer: Self-pay | Admitting: Cardiology

## 2021-01-31 ENCOUNTER — Telehealth (HOSPITAL_COMMUNITY): Payer: Self-pay | Admitting: Cardiology

## 2021-01-31 MED ORDER — POTASSIUM CHLORIDE CRYS ER 20 MEQ PO TBCR: 20.0000 meq | EXTENDED_RELEASE_TABLET | Freq: Two times a day (BID) | ORAL | 1 refills | Status: AC

## 2021-01-31 NOTE — Telephone Encounter (Signed)
Opened in error

## 2021-02-05 DIAGNOSIS — E1165 Type 2 diabetes mellitus with hyperglycemia: Secondary | ICD-10-CM | POA: Diagnosis not present

## 2021-02-05 DIAGNOSIS — E11319 Type 2 diabetes mellitus with unspecified diabetic retinopathy without macular edema: Secondary | ICD-10-CM | POA: Diagnosis not present

## 2021-02-05 DIAGNOSIS — D509 Iron deficiency anemia, unspecified: Secondary | ICD-10-CM | POA: Diagnosis not present

## 2021-02-05 DIAGNOSIS — I4891 Unspecified atrial fibrillation: Secondary | ICD-10-CM | POA: Diagnosis not present

## 2021-02-05 DIAGNOSIS — K219 Gastro-esophageal reflux disease without esophagitis: Secondary | ICD-10-CM | POA: Diagnosis not present

## 2021-02-05 DIAGNOSIS — I509 Heart failure, unspecified: Secondary | ICD-10-CM | POA: Diagnosis not present

## 2021-02-05 DIAGNOSIS — F322 Major depressive disorder, single episode, severe without psychotic features: Secondary | ICD-10-CM | POA: Diagnosis not present

## 2021-02-05 DIAGNOSIS — E0840 Diabetes mellitus due to underlying condition with diabetic neuropathy, unspecified: Secondary | ICD-10-CM | POA: Diagnosis not present

## 2021-02-05 DIAGNOSIS — I6789 Other cerebrovascular disease: Secondary | ICD-10-CM | POA: Diagnosis not present

## 2021-02-05 DIAGNOSIS — E1151 Type 2 diabetes mellitus with diabetic peripheral angiopathy without gangrene: Secondary | ICD-10-CM | POA: Diagnosis not present

## 2021-02-05 DIAGNOSIS — E782 Mixed hyperlipidemia: Secondary | ICD-10-CM | POA: Diagnosis not present

## 2021-02-05 DIAGNOSIS — N400 Enlarged prostate without lower urinary tract symptoms: Secondary | ICD-10-CM | POA: Diagnosis not present

## 2021-02-09 ENCOUNTER — Other Ambulatory Visit: Payer: Self-pay | Admitting: Neurology

## 2021-02-12 ENCOUNTER — Ambulatory Visit (INDEPENDENT_AMBULATORY_CARE_PROVIDER_SITE_OTHER): Payer: Medicare Other | Admitting: Neurology

## 2021-02-12 ENCOUNTER — Encounter: Payer: Self-pay | Admitting: Neurology

## 2021-02-12 ENCOUNTER — Other Ambulatory Visit: Payer: Self-pay

## 2021-02-12 VITALS — BP 128/77 | HR 84 | Ht 69.0 in | Wt 306.0 lb

## 2021-02-12 DIAGNOSIS — R413 Other amnesia: Secondary | ICD-10-CM

## 2021-02-12 MED ORDER — DONEPEZIL HCL 10 MG PO TABS: ORAL_TABLET | ORAL | 4 refills | Status: DC

## 2021-02-12 NOTE — Patient Instructions (Signed)
Continue Aricept Call for any memory changes  See you back in 1 year

## 2021-02-12 NOTE — Progress Notes (Signed)
I have read the note, and I agree with the clinical assessment and plan.  Ah Bott K Emilly Lavey   

## 2021-02-12 NOTE — Progress Notes (Signed)
PATIENT: Oscar [name]. DOB: November 06, 1945  REASON FOR VISIT: follow up HISTORY FROM: patient Primary neurologist: Dr. Jannifer Franklin  HISTORY OF PRESENT ILLNESS: Today 02/12/21  Oscar [name] is a 75 year old male with history of memory disturbance since stroke.  Remains stable.  MMSE 26/30.  His son continues to live with him.  He does some short distance driving during the daytime to the grocery store or pharmacy.  His daughter manages his medications and finances.  He needs new hearing aids.  He has vision impairments due to diabetes.  His A1c is improved at 7, he is wearing a continuous glucose monitor.  No falls.  Uses CPAP.  Has gotten off track in his diet.  Remains on Aricept, tolerating well.  Sleeping well.  Here today for evaluation accompanied by his daughter, [name]. Admits he mostly watches TV, he does enjoy being outside, fishing.   Update 02/08/2020 SS: Oscar [name] is a 75 year old male with history of memory disturbance since his stroke.  His son lives with him, his wife passed away in 25-Oct-2018.  He remains on Aricept 10 mg at bedtime.  Feels memory is overall stable, has trouble with short term memory. Thinks some of his memory is related to his poor hearing. His daughter manages his medications and finances.  He has someone who comes to clean every 2 weeks.  He does his own laundry.  He does local driving to the grocery and pharmacy, does this well.  He has started a diet, has lost about 28 lbs. his health is overall stable, uses CPAP.  No new problems or concerns.  Presents today accompanied by his daughter.  HISTORY  08/09/2019 SS: Oscar [name] is a 75 year old male with history of memory disturbance since a stroke that occurred about 5 years ago.  Unfortunately, earlier this year his wife unexpectedly passed away.  He lives with his son.  He is able to perform all of his own ADLs.  He says he does have someone who comes in the home every 2 weeks to help clean.  His daughter manages his  medication.  He continues to drive, but does not drive at night or long distances.  He has not had any falls.  He has difficulty hearing.  He has trouble remembering his doctor's appointments.  His daughter manages his finances, but that is more so due to difficulty with his vision.  He remains on Aricept.  He feels his memory is overall stable. He denies any new issues that have arisen. He presents today for evaluation via virtual visit.  REVIEW OF SYSTEMS: Out of a complete 14 system review of symptoms, the patient complains only of the following symptoms, and all other reviewed systems are negative.  See HPI  ALLERGIES: Allergies  Allergen Reactions   Iohexol Hives and Other (See Comments)     Desc: HIVES S/P 13HR.PREMEDS, ?LOW DOSAGE PREMEDS    Ivp Dye [Iodinated Diagnostic Agents] Hives and Other (See Comments)    welps     HOME MEDICATIONS: Outpatient Medications Prior to Visit  Medication Sig Dispense Refill   acetaminophen (TYLENOL) 500 MG tablet Take 500 mg by mouth 2 (two) times daily as needed for moderate pain.     allopurinol (ZYLOPRIM) 300 MG tablet Take 300 mg by mouth daily.  11   B-D INS SYRINGE 0.5CC/30GX1/2" 30G X 1/2" 0.5 ML MISC USE THREE TIMES A DAY DX-E11.65 INJECTION  2   blood glucose meter kit and supplies KIT Dispense based  on patient and insurance preference. Use up to four times daily as directed. (FOR ICD-9 250.00, 250.01). 1 each 0   buPROPion (WELLBUTRIN SR) 150 MG 12 hr tablet Take 150 mg by mouth daily.  5   diltiazem (CARDIZEM CD) 360 MG 24 hr capsule Take 1 capsule (360 mg total) by mouth daily. 90 capsule 0   donepezil (ARICEPT) 10 MG tablet Take 1 at bedtime (Patient not taking: Reported on 05/25/2020) 90 tablet 3   ELIQUIS 5 MG TABS tablet TAKE 1 TABLET BY MOUTH TWICE A DAY 180 tablet 1   empagliflozin (JARDIANCE) 10 MG TABS tablet Take 1 tablet (10 mg total) by mouth daily before breakfast. 30 tablet 11   escitalopram (LEXAPRO) 20 MG tablet Take 40  mg by mouth daily.  3   furosemide (LASIX) 80 MG tablet Take 1 tablet (80 mg total) by mouth daily. T 90 tablet 3   indomethacin (INDOCIN) 50 MG capsule Take 1 capsule (50 mg total) by mouth 3 (three) times daily as needed (for gout flare ups ). Reported on 12/27/2015 90 capsule 1   insulin aspart (NOVOLOG) 100 UNIT/ML injection Inject 50 Units into the skin 3 (three) times daily as needed for high blood sugar.      LEVEMIR 100 UNIT/ML injection Inject 50 Units into the skin at bedtime.   5   metolazone (ZAROXOLYN) 2.5 MG tablet Take 1 tablet every Wednesday. 10 tablet 3   pentoxifylline (TRENTAL) 400 MG CR tablet TAKE 1 TABLET BY MOUTH 3 TIMES DAILY WITH MEALS. (Patient not taking: Reported on 05/25/2020) 270 tablet 1   potassium chloride SA (KLOR-CON M20) 20 MEQ tablet Take 1 tablet (20 mEq total) by mouth 2 (two) times daily. 180 tablet 1   simvastatin (ZOCOR) 20 MG tablet Take 20 mg by mouth every evening.  3   tamsulosin (FLOMAX) 0.4 MG CAPS capsule Take 1 capsule (0.4 mg total) by mouth daily after breakfast. (Patient taking differently: Take 0.4 mg by mouth 2 (two) times daily.) 30 capsule 0   No facility-administered medications prior to visit.    PAST MEDICAL HISTORY: Past Medical History:  Diagnosis Date   Anal fissure    h/o - no recent complications   Arthritis    hips   Atrial fibrillation (HCC)    Depression    Diabetes mellitus without complication ([place])    improved after diet/exercise   Enlarged prostate    GERD (gastroesophageal reflux disease)    Gout    H/O hiatal hernia    History of kidney stones    Hx of typhoid fever    as a child   Hypertension    improved after diet/exercise   Memory change 04/14/2017   OSA (obstructive sleep apnea)    wears cpap   Pneumonia    PONV (postoperative nausea and vomiting)    also difficulty waking up   Stroke ([place]) 08/2013   short term memory loss (slight)    PAST SURGICAL HISTORY: Past Surgical History:  Procedure  Laterality Date   AMPUTATION Left 10/02/2017   Procedure: LEFT FOOT 1ST RAY AMPUTATION;  Surgeon: Newt Minion, MD;  Location: [facility];  Service: Orthopedics;  Laterality: Left;   AMPUTATION Left 01/15/2018   Procedure: LEFT FOOT 2ND TOE AMPUTATION;  Surgeon: Newt Minion, MD;  Location: [facility];  Service: Orthopedics;  Laterality: Left;   carpel tunnel Bilateral    CATARACT EXTRACTION W/ INTRAOCULAR LENS  IMPLANT, BILATERAL     CHOLECYSTECTOMY  COLONOSCOPY     EYE SURGERY     laser surgery    I & D EXTREMITY Left 09/15/2014   Procedure: Excision Necrotic Left Achilles, Skin Graft, apply Wound VAC;  Surgeon: Newt Minion, MD;  Location: [facility];  Service: Orthopedics;  Laterality: Left;   KIDNEY STONE SURGERY     KNEE ARTHROSCOPY Bilateral    LIGAMENT REPAIR     TONSILLECTOMY     VASECTOMY      FAMILY HISTORY: Family History  Problem Relation Age of Onset   COPD Father    Heart attack Sister        died age 43 of MI    SOCIAL HISTORY: Social History   Socioeconomic History   Marital status: Married    Spouse name: Sonny Masters"   Number of children: 3   Years of education: 12   Highest education level: Not on file  Occupational History   Occupation: Retired  Tobacco Use   Smoking status: Never   Smokeless tobacco: Never  Vaping Use   Vaping Use: Never used  Substance and Sexual Activity   Alcohol use: Yes    Alcohol/week: 0.0 standard drinks    Comment: rare - 1-2x /yr   Drug use: No   Sexual activity: Not on file  Other Topics Concern   Not on file  Social History Narrative   Lives with wife   Caffeine use: Tea sometimes (unsweet)   Right handed    Social Determinants of Health   Financial Resource Strain: Not on file  Food Insecurity: Not on file  Transportation Needs: Not on file  Physical Activity: Not on file  Stress: Not on file  Social Connections: Not on file  Intimate Partner Violence: Not on file   PHYSICAL EXAM  Vitals:   02/12/21 1110   BP: 128/77  Pulse: 84  Weight: (!) 306 lb (138.8 kg)  Height: 5\' 9"  (1.753 m)    Body mass index is 45.19 kg/m.  Generalized: Well developed, in no acute distress  MMSE - Mini Mental State Exam 02/12/2021 02/08/2020 04/15/2018  Orientation to time 4 4 5   Orientation to Place 4 5 4   Registration 3 3 3   Attention/ Calculation 4 1 5   Recall 2 3 2   Language- name 2 objects 2 2 2   Language- repeat 1 1 1   Language- follow 3 step command 3 3 3   Language- read & follow direction 1 1 1   Write a sentence 1 1 1   Copy design 1 1 1   Total score 26 25 28    Neurological examination  Mentation: Alert oriented to time, place, history taking. Follows all commands speech and language fluent, smiling, very pleasant, hard of hearing Cranial nerve II-XII: Pupils were equal round reactive to light. Extraocular movements were full, visual field were full on confrontational test. Facial sensation and strength were normal. Head turning and shoulder shrug  were normal and symmetric. Motor: The motor testing reveals 5 over 5 strength of all 4 extremities. Good symmetric motor tone is noted throughout.  Sensory: Sensory testing is intact to soft touch on all 4 extremities. No evidence of extinction is noted.  Coordination: Cerebellar testing reveals good finger-nose-finger and heel-to-shin bilaterally.  Gait and station: Gait is wide-based, but steady and independent.  DIAGNOSTIC DATA (LABS, IMAGING, TESTING) - I reviewed patient records, labs, notes, testing and imaging myself where available.  Lab Results  Component Value Date   WBC 9.3 01/15/2018   HGB 12.0 (  L) 01/15/2018   HCT 38.0 (L) 01/15/2018   MCV 90.0 01/15/2018   PLT 209 01/15/2018      Component Value Date/Time   NA 140 01/15/2018 1107   K 3.8 01/15/2018 1107   CL 104 01/15/2018 1107   CO2 23 01/15/2018 1107   GLUCOSE 218 (H) 01/15/2018 1107   BUN 41 (H) 01/15/2018 1107   CREATININE 1.67 (H) 01/15/2018 1107   CREATININE 1.57 (H)  06/04/2015 0958   CALCIUM 9.4 01/15/2018 1107   PROT 6.9 09/07/2016 2124   ALBUMIN 3.3 (L) 09/07/2016 2124   AST 200 (H) 09/07/2016 2124   ALT 98 (H) 09/07/2016 2124   ALKPHOS 44 09/07/2016 2124   BILITOT 1.0 09/07/2016 2124   GFRNONAA 39 (L) 01/15/2018 1107   GFRAA 46 (L) 01/15/2018 1107   Lab Results  Component Value Date   CHOL 200 08/07/2013   HDL 30 (L) 08/07/2013   LDLCALC 125 (H) 08/07/2013   TRIG 225 (H) 08/07/2013   CHOLHDL 6.7 08/07/2013   Lab Results  Component Value Date   HGBA1C 10.1 (H) 09/08/2016   Lab Results  Component Value Date   VITAMINB12 448 04/14/2017   Lab Results  Component Value Date   TSH 3.093 06/04/2015   ASSESSMENT AND PLAN 75 y.o. year old male  has a past medical history of Anal fissure, Arthritis, Atrial fibrillation ([place]), Depression, Diabetes mellitus without complication ([place]), Enlarged prostate, GERD (gastroesophageal reflux disease), Gout, H/O hiatal hernia, History of kidney stones, typhoid fever, Hypertension, Memory change (04/14/2017), OSA (obstructive sleep apnea), Pneumonia, PONV (postoperative nausea and vomiting), and Stroke ([place]) (08/2013). here with:  1.  Memory disturbance  -Remains overall stable, doing well, is a delightful patient -MMSE was 26/30 today -Will continue Aricept 10 mg at bedtime -He will do his best to remain active, work on weight loss -Will continue to see his PCP for management of vascular risk factors -Follow-up in 1 year or sooner if needed  I spent 32 minutes of face-to-face and non-face-to-face time with patient.  This included previsit chart review, lab review, study review, order entry, performing MMSE, discussing results, medications, and follow-up.   Butler Denmark, AGNP-C, DNP 02/12/2021, 11:28 AM [facility] [address] [place], [place] [address] [phone]

## 2021-03-27 DIAGNOSIS — D509 Iron deficiency anemia, unspecified: Secondary | ICD-10-CM | POA: Diagnosis not present

## 2021-03-27 DIAGNOSIS — I509 Heart failure, unspecified: Secondary | ICD-10-CM | POA: Diagnosis not present

## 2021-03-27 DIAGNOSIS — N400 Enlarged prostate without lower urinary tract symptoms: Secondary | ICD-10-CM | POA: Diagnosis not present

## 2021-03-27 DIAGNOSIS — F322 Major depressive disorder, single episode, severe without psychotic features: Secondary | ICD-10-CM | POA: Diagnosis not present

## 2021-03-27 DIAGNOSIS — E1165 Type 2 diabetes mellitus with hyperglycemia: Secondary | ICD-10-CM | POA: Diagnosis not present

## 2021-03-27 DIAGNOSIS — I6789 Other cerebrovascular disease: Secondary | ICD-10-CM | POA: Diagnosis not present

## 2021-03-27 DIAGNOSIS — E782 Mixed hyperlipidemia: Secondary | ICD-10-CM | POA: Diagnosis not present

## 2021-03-27 DIAGNOSIS — K219 Gastro-esophageal reflux disease without esophagitis: Secondary | ICD-10-CM | POA: Diagnosis not present

## 2021-03-27 DIAGNOSIS — E1151 Type 2 diabetes mellitus with diabetic peripheral angiopathy without gangrene: Secondary | ICD-10-CM | POA: Diagnosis not present

## 2021-03-27 DIAGNOSIS — I4891 Unspecified atrial fibrillation: Secondary | ICD-10-CM | POA: Diagnosis not present

## 2021-03-27 DIAGNOSIS — E11319 Type 2 diabetes mellitus with unspecified diabetic retinopathy without macular edema: Secondary | ICD-10-CM | POA: Diagnosis not present

## 2021-04-16 DIAGNOSIS — H8110 Benign paroxysmal vertigo, unspecified ear: Secondary | ICD-10-CM | POA: Diagnosis not present

## 2021-05-29 DIAGNOSIS — E0840 Diabetes mellitus due to underlying condition with diabetic neuropathy, unspecified: Secondary | ICD-10-CM | POA: Diagnosis not present

## 2021-05-29 DIAGNOSIS — G4733 Obstructive sleep apnea (adult) (pediatric): Secondary | ICD-10-CM | POA: Diagnosis not present

## 2021-05-29 DIAGNOSIS — I4891 Unspecified atrial fibrillation: Secondary | ICD-10-CM | POA: Diagnosis not present

## 2021-05-29 DIAGNOSIS — Z7984 Long term (current) use of oral hypoglycemic drugs: Secondary | ICD-10-CM | POA: Diagnosis not present

## 2021-05-29 DIAGNOSIS — I509 Heart failure, unspecified: Secondary | ICD-10-CM | POA: Diagnosis not present

## 2021-05-29 DIAGNOSIS — I6789 Other cerebrovascular disease: Secondary | ICD-10-CM | POA: Diagnosis not present

## 2021-05-29 DIAGNOSIS — D509 Iron deficiency anemia, unspecified: Secondary | ICD-10-CM | POA: Diagnosis not present

## 2021-05-29 DIAGNOSIS — H8110 Benign paroxysmal vertigo, unspecified ear: Secondary | ICD-10-CM | POA: Diagnosis not present

## 2021-05-29 DIAGNOSIS — E1165 Type 2 diabetes mellitus with hyperglycemia: Secondary | ICD-10-CM | POA: Diagnosis not present

## 2021-05-29 DIAGNOSIS — F322 Major depressive disorder, single episode, severe without psychotic features: Secondary | ICD-10-CM | POA: Diagnosis not present

## 2021-05-29 DIAGNOSIS — D6869 Other thrombophilia: Secondary | ICD-10-CM | POA: Diagnosis not present

## 2021-05-29 DIAGNOSIS — E538 Deficiency of other specified B group vitamins: Secondary | ICD-10-CM | POA: Diagnosis not present

## 2021-05-29 DIAGNOSIS — R413 Other amnesia: Secondary | ICD-10-CM | POA: Diagnosis not present

## 2021-05-29 DIAGNOSIS — L820 Inflamed seborrheic keratosis: Secondary | ICD-10-CM | POA: Diagnosis not present

## 2021-06-10 DIAGNOSIS — Z7984 Long term (current) use of oral hypoglycemic drugs: Secondary | ICD-10-CM | POA: Diagnosis not present

## 2021-06-10 DIAGNOSIS — E1151 Type 2 diabetes mellitus with diabetic peripheral angiopathy without gangrene: Secondary | ICD-10-CM | POA: Diagnosis not present

## 2021-06-10 DIAGNOSIS — I4891 Unspecified atrial fibrillation: Secondary | ICD-10-CM | POA: Diagnosis not present

## 2021-06-10 DIAGNOSIS — E1121 Type 2 diabetes mellitus with diabetic nephropathy: Secondary | ICD-10-CM | POA: Diagnosis not present

## 2021-09-19 DIAGNOSIS — Z7984 Long term (current) use of oral hypoglycemic drugs: Secondary | ICD-10-CM | POA: Diagnosis not present

## 2021-09-19 DIAGNOSIS — E11319 Type 2 diabetes mellitus with unspecified diabetic retinopathy without macular edema: Secondary | ICD-10-CM | POA: Diagnosis not present

## 2021-09-19 DIAGNOSIS — D509 Iron deficiency anemia, unspecified: Secondary | ICD-10-CM | POA: Diagnosis not present

## 2021-09-19 DIAGNOSIS — I509 Heart failure, unspecified: Secondary | ICD-10-CM | POA: Diagnosis not present

## 2021-09-19 DIAGNOSIS — M109 Gout, unspecified: Secondary | ICD-10-CM | POA: Diagnosis not present

## 2021-09-19 DIAGNOSIS — E0840 Diabetes mellitus due to underlying condition with diabetic neuropathy, unspecified: Secondary | ICD-10-CM | POA: Diagnosis not present

## 2021-09-19 DIAGNOSIS — E1151 Type 2 diabetes mellitus with diabetic peripheral angiopathy without gangrene: Secondary | ICD-10-CM | POA: Diagnosis not present

## 2021-09-19 DIAGNOSIS — G4733 Obstructive sleep apnea (adult) (pediatric): Secondary | ICD-10-CM | POA: Diagnosis not present

## 2021-09-19 DIAGNOSIS — E876 Hypokalemia: Secondary | ICD-10-CM | POA: Diagnosis not present

## 2021-09-19 DIAGNOSIS — E1121 Type 2 diabetes mellitus with diabetic nephropathy: Secondary | ICD-10-CM | POA: Diagnosis not present

## 2021-09-19 DIAGNOSIS — H8110 Benign paroxysmal vertigo, unspecified ear: Secondary | ICD-10-CM | POA: Diagnosis not present

## 2021-09-19 DIAGNOSIS — D6869 Other thrombophilia: Secondary | ICD-10-CM | POA: Diagnosis not present

## 2021-09-19 DIAGNOSIS — E1165 Type 2 diabetes mellitus with hyperglycemia: Secondary | ICD-10-CM | POA: Diagnosis not present

## 2021-09-19 DIAGNOSIS — I4891 Unspecified atrial fibrillation: Secondary | ICD-10-CM | POA: Diagnosis not present

## 2021-10-25 DIAGNOSIS — M65342 Trigger finger, left ring finger: Secondary | ICD-10-CM | POA: Diagnosis not present

## 2021-10-25 DIAGNOSIS — M79642 Pain in left hand: Secondary | ICD-10-CM | POA: Diagnosis not present

## 2021-10-25 DIAGNOSIS — R202 Paresthesia of skin: Secondary | ICD-10-CM | POA: Diagnosis not present

## 2021-10-25 DIAGNOSIS — R200 Anesthesia of skin: Secondary | ICD-10-CM | POA: Diagnosis not present

## 2021-10-25 DIAGNOSIS — M79641 Pain in right hand: Secondary | ICD-10-CM | POA: Diagnosis not present

## 2021-10-30 DIAGNOSIS — G5612 Other lesions of median nerve, left upper limb: Secondary | ICD-10-CM | POA: Diagnosis not present

## 2021-10-30 DIAGNOSIS — G5602 Carpal tunnel syndrome, left upper limb: Secondary | ICD-10-CM | POA: Diagnosis not present

## 2021-11-04 DIAGNOSIS — G5602 Carpal tunnel syndrome, left upper limb: Secondary | ICD-10-CM | POA: Diagnosis not present

## 2021-11-18 ENCOUNTER — Telehealth (HOSPITAL_COMMUNITY): Payer: Self-pay | Admitting: *Deleted

## 2021-11-18 NOTE — Telephone Encounter (Signed)
Received fax from [facility], pt needs clearance for L carpal tunnel release and approved to hold Eliquis prior ? ?Per Dr Haroldine Laws: pt cleared, hold Eliquies 24 hours prior ? ?Form faxed back to them at [phone] atten Hassan Rowan ?

## 2021-11-25 ENCOUNTER — Encounter (HOSPITAL_BASED_OUTPATIENT_CLINIC_OR_DEPARTMENT_OTHER): Payer: Self-pay | Admitting: Emergency Medicine

## 2021-11-25 ENCOUNTER — Telehealth: Payer: Self-pay | Admitting: Neurology

## 2021-11-25 ENCOUNTER — Emergency Department (HOSPITAL_BASED_OUTPATIENT_CLINIC_OR_DEPARTMENT_OTHER)
Admission: EM | Admit: 2021-11-25 | Discharge: 2021-11-25 | Disposition: A | Discharge status: Home / Self Care | Payer: Medicare Other | Attending: Emergency Medicine | Admitting: Emergency Medicine

## 2021-11-25 ENCOUNTER — Other Ambulatory Visit: Payer: Self-pay

## 2021-11-25 ENCOUNTER — Emergency Department (HOSPITAL_BASED_OUTPATIENT_CLINIC_OR_DEPARTMENT_OTHER): Payer: Medicare Other

## 2021-11-25 DIAGNOSIS — W19XXXA Unspecified fall, initial encounter: Secondary | ICD-10-CM | POA: Insufficient documentation

## 2021-11-25 DIAGNOSIS — S065XAA Traumatic subdural hemorrhage with loss of consciousness status unknown, initial encounter: Secondary | ICD-10-CM

## 2021-11-25 DIAGNOSIS — Z7901 Long term (current) use of anticoagulants: Secondary | ICD-10-CM | POA: Insufficient documentation

## 2021-11-25 DIAGNOSIS — S61502A Unspecified open wound of left wrist, initial encounter: Secondary | ICD-10-CM | POA: Diagnosis not present

## 2021-11-25 DIAGNOSIS — S0990XA Unspecified injury of head, initial encounter: Secondary | ICD-10-CM | POA: Diagnosis not present

## 2021-11-25 DIAGNOSIS — S065X0A Traumatic subdural hemorrhage without loss of consciousness, initial encounter: Secondary | ICD-10-CM | POA: Diagnosis not present

## 2021-11-25 DIAGNOSIS — Z794 Long term (current) use of insulin: Secondary | ICD-10-CM | POA: Diagnosis not present

## 2021-11-25 NOTE — ED Triage Notes (Signed)
Pt tripped and fell in parking lot, hitting forehead and nose on pavement and scraping left hand. Nosebleed at scene, bleeding since controlled. No visible swelling or neuro deficits noted - pt on eliquis.  Left hand wrapped, bleeding controlled by dressing.   ?

## 2021-11-25 NOTE — Telephone Encounter (Signed)
Pt wife Lelon Frohlich, on [place]) called pt had a fall today, and hit his nose and head.  ?Pt went to ER and did CT-scan and subneural hematoma brain bleed. Pt is staying off ELIQUIS 5 MG TABS tablet  for one week and ER told him to follow up with neurologist. Pt scheduled for 4/19.  ?

## 2021-11-25 NOTE — ED Provider Notes (Signed)
?[facility] ?Provider Note ? ? ?CSN: [id number] ?Arrival date & time: 11/25/21  1149 ? ?  ? ?History ? ?Chief Complaint  ?Patient presents with  ? Fall  ? ? ?Oscar [name]. is a 76 y.o. male. ? ? ?Fall ?Pertinent negatives include no chest pain, no abdominal pain, no headaches and no shortness of breath.  ?76 year old male presents to emergency department after fall on concrete at approximately 11 AM this morning.  Patient is on his way to doctor's appointment when he tried to step over a curb and fell hitting his head, nose, and left hand.  He denies losing consciousness, any prefall symptoms.  Bleeding from the backside of his left hand and nose ceased with direct pressure.  He is currently on Eliquis and was recommended by the doctor at his appointment to visit the ED for head CT.  Denies chest pain, shortness of breath, nausea/vomiting/diarrhea, abdominal pain, chills, fever. ? ?Patient is a past medical history of atrial fibrillation for which he is on Eliquis therapy. ?  ? ?Home Medications ?Prior to Admission medications   ?Medication Sig Start Date End Date Taking? Authorizing Provider  ?acetaminophen (TYLENOL) 500 MG tablet Take 500 mg by mouth 2 (two) times daily as needed for moderate pain.    [provider]  ?allopurinol (ZYLOPRIM) 300 MG tablet Take 300 mg by mouth daily. 12/11/17   [provider]  ?B-D INS SYRINGE 0.5CC/30GX1/2" 30G X 1/2" 0.5 ML MISC USE THREE TIMES A DAY DX-E11.65 INJECTION 04/01/17   [provider]  ?blood glucose meter kit and supplies KIT Dispense based on patient and insurance preference. Use up to four times daily as directed. (FOR ICD-9 250.00, 250.01). 09/03/14   Theodis Blaze, MD  ?buPROPion [facility] SR) 150 MG 12 hr tablet Take 150 mg by mouth daily. 09/12/17   [provider]  ?diltiazem (CARDIZEM CD) 360 MG 24 hr capsule Take 1 capsule (360 mg total) by mouth daily. 02/20/14   Evans Lance, MD   ?donepezil (ARICEPT) 10 MG tablet Take 1 at bedtime 02/12/21   Suzzanne Cloud, NP  ?Arne [place] 5 MG TABS tablet TAKE 1 TABLET BY MOUTH TWICE A DAY 05/03/18   Bensimhon, Shaune Pascal, MD  ?empagliflozin (JARDIANCE) 10 MG TABS tablet Take 1 tablet (10 mg total) by mouth daily before breakfast. 05/25/20   Bensimhon, Shaune Pascal, MD  ?escitalopram (LEXAPRO) 20 MG tablet Take 40 mg by mouth daily. 01/05/18   [provider]  ?furosemide (LASIX) 80 MG tablet Take 1 tablet (80 mg total) by mouth daily. T 01/28/16   Bensimhon, Shaune Pascal, MD  ?indomethacin (INDOCIN) 50 MG capsule Take 1 capsule (50 mg total) by mouth 3 (three) times daily as needed (for gout flare ups ). Reported on 12/27/2015 05/11/17   Suzan Slick, NP  ?insulin aspart (NOVOLOG) 100 UNIT/ML injection Inject 50 Units into the skin 3 (three) times daily as needed for high blood sugar.     [provider]  ?LEVEMIR 100 UNIT/ML injection Inject 50 Units into the skin at bedtime.  05/16/15   [provider]  ?metolazone (ZAROXOLYN) 2.5 MG tablet Take 1 tablet every Wednesday. 11/07/16   Bensimhon, Shaune Pascal, MD  ?pentoxifylline (TRENTAL) 400 MG CR tablet TAKE 1 TABLET BY MOUTH 3 TIMES DAILY WITH MEALS. ?Patient not taking: Reported on 05/25/2020 02/23/19   Newt Minion, MD  ?potassium chloride SA (KLOR-CON M20) 20 MEQ tablet Take 1 tablet (20 mEq  total) by mouth 2 (two) times daily. 01/31/21   Bensimhon, Shaune Pascal, MD  ?simvastatin (ZOCOR) 20 MG tablet Take 20 mg by mouth every evening. 01/07/18   [provider]  ?tamsulosin (FLOMAX) 0.4 MG CAPS capsule Take 1 capsule (0.4 mg total) by mouth daily after breakfast. ?Patient taking differently: Take 0.4 mg by mouth 2 (two) times daily. 08/08/13   Josetta Huddle, MD  ?   ? ?Allergies    ?Iohexol and Ivp dye [iodinated contrast media]   ? ?Review of Systems   ?Review of Systems  ?Constitutional:  Negative for chills, fatigue and fever.  ?HENT:  Negative for congestion and rhinorrhea.   ?Respiratory:   Negative for apnea, cough, shortness of breath and wheezing.   ?Cardiovascular:  Negative for chest pain, palpitations and leg swelling.  ?Gastrointestinal:  Negative for abdominal distention, abdominal pain, constipation, diarrhea, nausea and vomiting.  ?Genitourinary:  Negative for dysuria, frequency and urgency.  ?Musculoskeletal:  Negative for neck pain and neck stiffness.  ?     Left wrist pain and nose pain from fall.  ?Skin:  Positive for wound. Negative for color change, pallor and rash.  ?     Wound to dorsal aspect of left wrist.  No observable nasal deformity.  ?Neurological:  Negative for seizures, syncope, weakness, light-headedness and headaches.  ?Hematological:  Bruises/bleeds easily.  ?     Easy bleeding secondary to Eliquis use  ? ?Physical Exam ?Updated Vital Signs ?BP 117/77 (BP Location: Right Arm)   Pulse 63   Temp 97.6 ?F (36.4 ?C) (Oral)   Resp 18   SpO2 96%  ?Physical Exam ?Vitals and nursing note reviewed.  ?Constitutional:   ?   General: He is not in acute distress. ?   Appearance: Normal appearance. He is obese. He is not ill-appearing, toxic-appearing or diaphoretic.  ?HENT:  ?   Head: Normocephalic and atraumatic.  ?   Right Ear: Tympanic membrane, ear canal and external ear normal.  ?   Left Ear: Tympanic membrane, ear canal and external ear normal.  ?   Nose: Nose normal.  ?   Mouth/Throat:  ?   Mouth: Mucous membranes are moist.  ?   Pharynx: Oropharynx is clear.  ?Eyes:  ?   General: No visual field deficit.    ?   Right eye: No discharge.  ?   Extraocular Movements: Extraocular movements intact.  ?   Conjunctiva/sclera: Conjunctivae normal.  ?   Pupils: Pupils are equal, round, and reactive to light.  ?Cardiovascular:  ?   Rate and Rhythm: Normal rate and regular rhythm.  ?   Pulses: Normal pulses.  ?   Heart sounds: Normal heart sounds.  ?Pulmonary:  ?   Effort: Pulmonary effort is normal. No respiratory distress.  ?   Breath sounds: Normal breath sounds.  ?Abdominal:  ?    General: Abdomen is flat. Bowel sounds are normal.  ?   Palpations: Abdomen is soft.  ?   Tenderness: There is no abdominal tenderness. There is no guarding.  ?Musculoskeletal:     ?   General: Normal range of motion.  ?     Hands: ? ?   Cervical back: Normal range of motion and neck supple.  ?   Comments: Wound noted left dorsal aspect of wrist.  Full AROM of wrist hand and digits.  Strength 5 out of 5 bilaterally upper extremities.  Patient is neurovascularly intact in upper extremities.  ?Skin: ?   General:  Skin is warm and dry.  ?   Capillary Refill: Capillary refill takes less than 2 seconds.  ?Neurological:  ?   General: No focal deficit present.  ?   Mental Status: He is alert and oriented to person, place, and time.  ?   Cranial Nerves: Cranial nerves 2-12 are intact. No cranial nerve deficit, dysarthria or facial asymmetry.  ?   Sensory: Sensation is intact.  ?   Motor: Motor function is intact.  ?   Coordination: Coordination is intact. Finger-Nose-Finger Test normal.  ?   Gait: Gait is intact.  ?Psychiatric:     ?   Mood and Affect: Mood normal.     ?   Behavior: Behavior normal.     ?   Thought Content: Thought content normal.     ?   Judgment: Judgment normal.  ? ? ?ED Results / Procedures / Treatments   ?Labs ?(all labs ordered are listed, but only abnormal results are displayed) ?Labs Reviewed - No data to display ? ?EKG ?None ? ?Radiology ?No results found. ? ?Procedures ?Procedures  ? ? ?Medications Ordered in ED ?Medications - No data to display ? ?ED Course/ Medical Decision Making/ A&P ?Clinical Course as of 11/25/21 1655  ?Mon Nov 25, 2021  ?1459  I discussed the case with APP Megan with Neurosurgery who recommends stopping eliquis for 1 week and strict return precautions [AH]  ?1654 CT Head Wo Contrast [CR]  ?  ?Clinical Course User Index ?[AH] Margarita Mail, PA-C ?[CR] Wilnette Kales, PA  ? ?                        ?Medical Decision Making ?Amount and/or Complexity of Data  Reviewed ?Radiology: ordered. ? ? ?This patient presents to the ED for concern of fall on anticoagulants, this involves an extensive number of treatment options, and is a complaint that carries with it a high risk of complication

## 2021-11-25 NOTE — ED Triage Notes (Signed)
Pt alert, oriented x4, denies HA, dizziness, vision change, n/v or other symptoms.  No focal deficits noted. ?

## 2021-11-25 NOTE — Discharge Instructions (Addendum)
Patient was advised to hold Eliquis for 1 week time.  Patient was educated regarding worrisome signs and symptoms to keep a look out for and to return to [facility] ED noticed.  They are included at the end of this note.  No need to follow-up if symptoms are not experienced.  Begin Eliquis after 1 week of cessation. ?Clear fluid draining from your nose or ears. ?Nausea or vomiting. ?Changes in speech or trouble understanding what people say. ?Seizures. ?Drowsiness or a decrease in alertness. ?Double vision. ?Numbness or inability to move (paralysis) in any part of your body. ?Difficulty walking or poor coordination. ?Difficulty thinking. ?Confusion or forgetfulness. ?Personality changes. ?Irrational or aggressive behavior. ?

## 2021-11-25 NOTE — ED Notes (Signed)
Patient verbalizes understanding of discharge instructions. Opportunity for questioning and answers were provided. Patient discharged from ED.  °

## 2021-11-26 NOTE — Telephone Encounter (Signed)
I have seen the patient in the past for memory, reviewing the chart it appears he is on Eliquis due to history of A-fib (initial consult 2018 with Dr. Jannifer Franklin for memory trouble since stroke 3 or 4 years prior to 2018).  Reviewed ER note, consulted neurosurgery who recommended holding Eliquis for 1 week.  CT head showed small left parafalcine subdural hematoma. He should contact his PCP for follow-up as well.  ?

## 2021-11-26 NOTE — Telephone Encounter (Signed)
Oscar [name] has reviewed his chart/ED notes. ? ?I spoke to his daughter. He is better today but really sore. Per discharge instructions, he is to hold Eliquis for one week then restart. Our office does not manage this medication. We see him once yearly for memory. Our office would not alter this plan. She is going to contact his PCP for follow up after this accident. His routine visit is still pending on 02/12/22.  ?

## 2021-11-27 ENCOUNTER — Ambulatory Visit: Payer: Medicare Other | Admitting: Neurology

## 2021-12-09 ENCOUNTER — Other Ambulatory Visit: Payer: Self-pay | Admitting: Orthopedic Surgery

## 2021-12-09 DIAGNOSIS — M65342 Trigger finger, left ring finger: Secondary | ICD-10-CM | POA: Diagnosis not present

## 2021-12-09 DIAGNOSIS — G5602 Carpal tunnel syndrome, left upper limb: Secondary | ICD-10-CM | POA: Diagnosis not present

## 2021-12-11 DIAGNOSIS — Z20822 Contact with and (suspected) exposure to covid-19: Secondary | ICD-10-CM | POA: Diagnosis not present

## 2021-12-18 DIAGNOSIS — D509 Iron deficiency anemia, unspecified: Secondary | ICD-10-CM | POA: Diagnosis not present

## 2021-12-18 DIAGNOSIS — Z1331 Encounter for screening for depression: Secondary | ICD-10-CM | POA: Diagnosis not present

## 2021-12-18 DIAGNOSIS — M179 Osteoarthritis of knee, unspecified: Secondary | ICD-10-CM | POA: Diagnosis not present

## 2021-12-18 DIAGNOSIS — E559 Vitamin D deficiency, unspecified: Secondary | ICD-10-CM | POA: Diagnosis not present

## 2021-12-18 DIAGNOSIS — E1121 Type 2 diabetes mellitus with diabetic nephropathy: Secondary | ICD-10-CM | POA: Diagnosis not present

## 2021-12-18 DIAGNOSIS — E11319 Type 2 diabetes mellitus with unspecified diabetic retinopathy without macular edema: Secondary | ICD-10-CM | POA: Diagnosis not present

## 2021-12-18 DIAGNOSIS — M109 Gout, unspecified: Secondary | ICD-10-CM | POA: Diagnosis not present

## 2021-12-18 DIAGNOSIS — I10 Essential (primary) hypertension: Secondary | ICD-10-CM | POA: Diagnosis not present

## 2021-12-18 DIAGNOSIS — E876 Hypokalemia: Secondary | ICD-10-CM | POA: Diagnosis not present

## 2021-12-18 DIAGNOSIS — D6869 Other thrombophilia: Secondary | ICD-10-CM | POA: Diagnosis not present

## 2021-12-18 DIAGNOSIS — R413 Other amnesia: Secondary | ICD-10-CM | POA: Diagnosis not present

## 2021-12-18 DIAGNOSIS — E1151 Type 2 diabetes mellitus with diabetic peripheral angiopathy without gangrene: Secondary | ICD-10-CM | POA: Diagnosis not present

## 2021-12-18 DIAGNOSIS — I509 Heart failure, unspecified: Secondary | ICD-10-CM | POA: Diagnosis not present

## 2021-12-18 DIAGNOSIS — H8110 Benign paroxysmal vertigo, unspecified ear: Secondary | ICD-10-CM | POA: Diagnosis not present

## 2021-12-18 DIAGNOSIS — E0840 Diabetes mellitus due to underlying condition with diabetic neuropathy, unspecified: Secondary | ICD-10-CM | POA: Diagnosis not present

## 2021-12-18 DIAGNOSIS — G4733 Obstructive sleep apnea (adult) (pediatric): Secondary | ICD-10-CM | POA: Diagnosis not present

## 2021-12-18 DIAGNOSIS — E1165 Type 2 diabetes mellitus with hyperglycemia: Secondary | ICD-10-CM | POA: Diagnosis not present

## 2021-12-18 DIAGNOSIS — Z0000 Encounter for general adult medical examination without abnormal findings: Secondary | ICD-10-CM | POA: Diagnosis not present

## 2021-12-25 ENCOUNTER — Encounter (HOSPITAL_COMMUNITY): Payer: Self-pay | Admitting: Orthopedic Surgery

## 2021-12-25 ENCOUNTER — Other Ambulatory Visit: Payer: Self-pay

## 2021-12-25 NOTE — Anesthesia Preprocedure Evaluation (Addendum)
Anesthesia Evaluation  Patient identified by MRN, date of birth, ID band Patient awake    Reviewed: Allergy & Precautions, NPO status , Patient's Chart, lab work & pertinent test results  History of Anesthesia Complications (+) PONV and history of anesthetic complications  Airway Mallampati: II       Dental no notable dental hx.    Pulmonary sleep apnea and Continuous Positive Airway Pressure Ventilation ,    Pulmonary exam normal        Cardiovascular hypertension, Pt. on medications + Past MI and +CHF  Normal cardiovascular exam     Neuro/Psych Depression    GI/Hepatic   Endo/Other  diabetes, Insulin DependentMorbid obesity  Renal/GU   negative genitourinary   Musculoskeletal   Abdominal (+) + obese,   Peds  Hematology   Anesthesia Other Findings   Reproductive/Obstetrics                            Anesthesia Physical Anesthesia Plan  ASA: 3  Anesthesia Plan: General   Post-op Pain Management: Minimal or no pain anticipated   Induction:   PONV Risk Score and Plan: 3 and Ondansetron and Treatment may vary due to age or medical condition  Airway Management Planned: LMA  Additional Equipment: None  Intra-op Plan:   Post-operative Plan: Extubation in OR  Informed Consent: I have reviewed the patients History and Physical, chart, labs and discussed the procedure including the risks, benefits and alternatives for the proposed anesthesia with the patient or authorized representative who has indicated his/her understanding and acceptance.     Dental advisory given  Plan Discussed with: CRNA  Anesthesia Plan Comments: (PAT note by Karoline Caldwell, PA-C:  History of chronic A-fib, maintained on Eliquis.  Echo 2016 showed EF 55 to 60%, normal wall motion, no significant valvular abnormalities.  Cardiopulmonary exercise test 2017 showed severe functional limitation primarily due to  severe obesity and related restrictive lung physiology.  Patient reported last dose Eliquis 12/25/2021.  Recently seen in ED 11/25/2021 for mechanical fall.  CT showed small left parafalcine subdural hematoma.  Neurosurgery was consulted and recommended holding Eliquis for 1 week.  IDDM 2, last A1c 6.6 at PCP office on 12/18/2021.  BMP from PCP office 12/18/2021 reviewed, creatinine elevated at 1.79 (baseline appears to be ~1.6 per records in [place]).  CBC from PCP office 12/18/2021 reviewed, unremarkable.  Pt will need DOS evaluation.  Cardiopulmonary exercise test 09/20/2015: Conclusion: Exercise testing with gas exchange demonstrates a  severe functional impairment when compared to matched sedentary  norms. The limitation appears to be primarily due to the patients  severe obesity and related restrictive lung physiology. However,  the blunted BP response to exercise and low O2 pulse suggest a  concomitant circulatory limitation which is likely related to his  diastolic HF and atrial fibrillation.  TTE 06/12/2015: - Left ventricle: The cavity size was normal. Systolic function was  normal. The estimated ejection fraction was in the range of 55%  to 60%. Wall motion was normal; there were no regional wall  motion abnormalities.  - Aortic valve: There was trivial regurgitation.  - Mitral valve: Calcified annulus. There was mild regurgitation.  - Left atrium: The atrium was moderately dilated.  - Right atrium: The atrium was mildly dilated.  - Atrial septum: No defect or patent foramen ovale was identified.  - Pulmonary arteries: PA peak pressure: 37 mm Hg (S).    )  Anesthesia Quick Evaluation  

## 2021-12-25 NOTE — Progress Notes (Addendum)
Interview done with the daughter Oscar [name], due to the pt's being hard of hearing. ? ?PCP - Dr. America Brown ? ?Cardiologist - Dr. Haroldine Laws ? ?EP- Denies ? ?Endocrine- Denies ? ?Pulm- Denies ? ?Chest x-ray - Denies ? ?EKG - 12/26/21- Day of surgery ? ?Stress Test - 09/20/15 (E) ? ?ECHO - 06/12/15 (E) ? ?Cardiac Cath - Denies ? ?AICD-na ?PM-na ?LOOP-na ? ?Nerve Stimulator- Denies ? ?Dialysis- Denies ? ?Sleep Study - Yes- Positive ?CPAP - Yes ? ?LABS- 12/26/21: CBC, BMP ? ?ASA- Denies ?ELIQUIS-LD- 5/17, per the Oscar [name], pt is to hold for 24 hrs ? ?ERAS- Yes- clears until 0630 ? ?HA1C- 2019(E): 10.1 ?Fasting Blood Sugar - 140-200 ?Checks Blood Sugar __5-6___ times a day, pt has a continuous monitor ? ?Anesthesia- Yes- med history ? ?Oscar [name] denies the pt having chest pain, sob, or fever during the pre-op phone call. All instructions explained to Oscar [name], with a verbal understanding of the material including: as of today, stop taking all Aspirin (unless instructed by your doctor) and Other Aspirin containing products, Vitamins, Fish oils, and Herbal medications. Also stop all NSAIDS i.e. Advil, Ibuprofen, Motrin, Aleve, Anaprox, Naproxen, BC, Goody Powders, and all Supplements.  ? ?WHAT DO I DO ABOUT MY DIABETES MEDICATION? ? ?Do not take Jardiance the morning of surgery. ? ?THE NIGHT BEFORE SURGERY, take _____20______ units of _____Levemir______insulin.     ? ?If your CBG is greater than 220 mg/dL, you may take 12-15 units of Novolog insulin. ? ?How do I manage my blood sugar before surgery? ?Check your blood sugar the morning of your surgery when you wake up and every 2 hours until you get to the Short Stay unit. ?If your blood sugar is less than 70 mg/dL, you will need to treat for low blood sugar: ?Do not take insulin. ?Treat a low blood sugar (less than 70 mg/dL) with ? cup of clear juice (cranberry or apple), 4 glucose tablets, OR glucose gel. ?Recheck blood sugar in 15 minutes after treatment (to make sure it is greater than  70 mg/dL). If your blood sugar is not greater than 70 mg/dL on recheck, call [phone] ? for further instructions. ?Report your blood sugar to the short stay nurse when you get to Short Stay. ? ?Reviewed and Endorsed by [facility] Patient Education Committee, August 2015 ? ? Oscar [name] also instructed to have the pt wear a mask and social distance if he goes out. The opportunity to ask questions was provided.  ?

## 2021-12-25 NOTE — Progress Notes (Signed)
Anesthesia Chart Review: ?Same day workup ? ?History of chronic A-fib, maintained on Eliquis.  Echo 2016 showed EF 55 to 60%, normal wall motion, no significant valvular abnormalities.  Cardiopulmonary exercise test 2017 showed severe functional limitation primarily due to severe obesity and related restrictive lung physiology. ? ?Patient reported last dose Eliquis 12/25/2021. ? ?Recently seen in ED 11/25/2021 for mechanical fall.  CT showed small left parafalcine subdural hematoma.  Neurosurgery was consulted and recommended holding Eliquis for 1 week. ? ?IDDM 2, last A1c 6.6 at PCP office on 12/18/2021. ? ?BMP from PCP office 12/18/2021 reviewed, creatinine elevated at 1.79 (baseline appears to be ~1.6 per records in [place]). ? ?CBC from PCP office 12/18/2021 reviewed, unremarkable. ? ?Pt will need DOS evaluation. ? ?Cardiopulmonary exercise test 09/20/2015: ?Conclusion: Exercise testing with gas exchange demonstrates a  ?severe functional impairment when compared to matched sedentary  ?norms. The limitation appears to be primarily due to the patients  ?severe obesity and related restrictive lung physiology. However,  ?the blunted BP response to exercise and low O2 pulse suggest a  ?concomitant circulatory limitation which is likely related to his  ?diastolic HF and atrial fibrillation. ? ?TTE 06/12/2015: ?- Left ventricle: The cavity size was normal. Systolic function was  ?  normal. The estimated ejection fraction was in the range of 55%  ?  to 60%. Wall motion was normal; there were no regional wall  ?  motion abnormalities.  ?- Aortic valve: There was trivial regurgitation.  ?- Mitral valve: Calcified annulus. There was mild regurgitation.  ?- Left atrium: The atrium was moderately dilated.  ?- Right atrium: The atrium was mildly dilated.  ?- Atrial septum: No defect or patent foramen ovale was identified.  ?- Pulmonary arteries: PA peak pressure: 37 mm Hg (S).  ? ? ?Karoline Caldwell, PA-C ?[facility] Short Stay  Center/Anesthesiology ?Phone [phone] ?12/25/2021 3:11 PM ? ?

## 2021-12-26 ENCOUNTER — Encounter (HOSPITAL_COMMUNITY): Payer: Self-pay | Admitting: Orthopedic Surgery

## 2021-12-26 ENCOUNTER — Other Ambulatory Visit: Payer: Self-pay

## 2021-12-26 ENCOUNTER — Ambulatory Visit (HOSPITAL_COMMUNITY): Payer: Medicare Other | Admitting: Physician Assistant

## 2021-12-26 ENCOUNTER — Ambulatory Visit (HOSPITAL_COMMUNITY)
Admission: RE | Admit: 2021-12-26 | Discharge: 2021-12-26 | Disposition: A | Discharge status: Home / Self Care | Payer: Medicare Other | Attending: Orthopedic Surgery | Admitting: Orthopedic Surgery

## 2021-12-26 ENCOUNTER — Ambulatory Visit (HOSPITAL_BASED_OUTPATIENT_CLINIC_OR_DEPARTMENT_OTHER): Payer: Medicare Other | Admitting: Physician Assistant

## 2021-12-26 ENCOUNTER — Encounter (HOSPITAL_COMMUNITY)
Admission: RE | Disposition: A | Discharge status: Home / Self Care | Payer: Self-pay | Source: Home / Self Care | Attending: Orthopedic Surgery

## 2021-12-26 DIAGNOSIS — G4733 Obstructive sleep apnea (adult) (pediatric): Secondary | ICD-10-CM

## 2021-12-26 DIAGNOSIS — I110 Hypertensive heart disease with heart failure: Secondary | ICD-10-CM | POA: Diagnosis not present

## 2021-12-26 DIAGNOSIS — I509 Heart failure, unspecified: Secondary | ICD-10-CM | POA: Diagnosis not present

## 2021-12-26 DIAGNOSIS — Z6841 Body Mass Index (BMI) 40.0 and over, adult: Secondary | ICD-10-CM | POA: Diagnosis not present

## 2021-12-26 DIAGNOSIS — E119 Type 2 diabetes mellitus without complications: Secondary | ICD-10-CM | POA: Diagnosis not present

## 2021-12-26 DIAGNOSIS — I252 Old myocardial infarction: Secondary | ICD-10-CM | POA: Diagnosis not present

## 2021-12-26 DIAGNOSIS — I482 Chronic atrial fibrillation, unspecified: Secondary | ICD-10-CM | POA: Diagnosis not present

## 2021-12-26 DIAGNOSIS — Z794 Long term (current) use of insulin: Secondary | ICD-10-CM | POA: Insufficient documentation

## 2021-12-26 DIAGNOSIS — Z79899 Other long term (current) drug therapy: Secondary | ICD-10-CM | POA: Insufficient documentation

## 2021-12-26 DIAGNOSIS — G5602 Carpal tunnel syndrome, left upper limb: Secondary | ICD-10-CM | POA: Insufficient documentation

## 2021-12-26 DIAGNOSIS — Z7984 Long term (current) use of oral hypoglycemic drugs: Secondary | ICD-10-CM | POA: Insufficient documentation

## 2021-12-26 DIAGNOSIS — F32A Depression, unspecified: Secondary | ICD-10-CM | POA: Diagnosis not present

## 2021-12-26 DIAGNOSIS — Z9989 Dependence on other enabling machines and devices: Secondary | ICD-10-CM | POA: Diagnosis not present

## 2021-12-26 DIAGNOSIS — Z7901 Long term (current) use of anticoagulants: Secondary | ICD-10-CM | POA: Diagnosis not present

## 2021-12-26 HISTORY — DX: Unspecified hearing loss, unspecified ear: H91.90

## 2021-12-26 HISTORY — PX: CARPAL TUNNEL RELEASE: SHX101

## 2021-12-26 LAB — BASIC METABOLIC PANEL
Anion gap: 9 (ref 5–15)
BUN: 39 mg/dL — ABNORMAL HIGH (ref 8–23)
CO2: 23 mmol/L (ref 22–32)
Calcium: 9.1 mg/dL (ref 8.9–10.3)
Chloride: 107 mmol/L (ref 98–111)
Creatinine, Ser: 2.35 mg/dL — ABNORMAL HIGH (ref 0.61–1.24)
GFR, Estimated: 28 mL/min — ABNORMAL LOW (ref >60)
Glucose, Bld: 159 mg/dL — ABNORMAL HIGH (ref 70–99)
Potassium: 4.1 mmol/L (ref 3.5–5.1)
Sodium: 139 mmol/L (ref 135–145)

## 2021-12-26 LAB — CBC
HCT: 39.7 % (ref 39.0–52.0)
Hemoglobin: 12.7 g/dL — ABNORMAL LOW (ref 13.0–17.0)
MCH: 31.2 pg (ref 26.0–34.0)
MCHC: 32 g/dL (ref 30.0–36.0)
MCV: 97.5 fL (ref 80.0–100.0)
Platelets: 195 10*3/uL (ref 150–400)
RBC: 4.07 MIL/uL — ABNORMAL LOW (ref 4.22–5.81)
RDW: 15.9 % — ABNORMAL HIGH (ref 11.5–15.5)
WBC: 7.5 10*3/uL (ref 4.0–10.5)
nRBC: 0 % (ref 0.0–0.2)

## 2021-12-26 LAB — GLUCOSE, CAPILLARY
Glucose-Capillary: 152 mg/dL — ABNORMAL HIGH (ref 70–99)
Glucose-Capillary: 179 mg/dL — ABNORMAL HIGH (ref 70–99)
Glucose-Capillary: 175 mg/dL — ABNORMAL HIGH (ref 70–99)

## 2021-12-26 SURGERY — CARPAL TUNNEL RELEASE
Anesthesia: General | Site: Hand | Laterality: Left

## 2021-12-26 MED ORDER — CEFAZOLIN IN SODIUM CHLORIDE 3-0.9 GM/100ML-% IV SOLN
3.0000 g | INTRAVENOUS | Status: AC
  Administered 2021-12-26: 2 g via INTRAVENOUS
  Filled 2021-12-26: qty 100

## 2021-12-26 MED ORDER — PHENYLEPHRINE 80 MCG/ML (10ML) SYRINGE FOR IV PUSH (FOR BLOOD PRESSURE SUPPORT)
PREFILLED_SYRINGE | INTRAVENOUS | Status: AC
  Filled 2021-12-26: qty 10

## 2021-12-26 MED ORDER — FENTANYL CITRATE (PF) 250 MCG/5ML IJ SOLN
INTRAMUSCULAR | Status: AC
  Filled 2021-12-26: qty 5

## 2021-12-26 MED ORDER — LACTATED RINGERS IV SOLN: INTRAVENOUS | Status: DC

## 2021-12-26 MED ORDER — FENTANYL CITRATE (PF) 250 MCG/5ML IJ SOLN
INTRAMUSCULAR | Status: DC | PRN
  Administered 2021-12-26: 100 ug via INTRAVENOUS

## 2021-12-26 MED ORDER — LIDOCAINE 2% (20 MG/ML) 5 ML SYRINGE
INTRAMUSCULAR | Status: AC
  Filled 2021-12-26: qty 15

## 2021-12-26 MED ORDER — TRAMADOL HCL 50 MG PO TABS: ORAL_TABLET | ORAL | 0 refills | Status: DC

## 2021-12-26 MED ORDER — ACETAMINOPHEN 160 MG/5ML PO SOLN: 325.0000 mg | ORAL | Status: DC | PRN

## 2021-12-26 MED ORDER — 0.9 % SODIUM CHLORIDE (POUR BTL) OPTIME
TOPICAL | Status: DC | PRN
  Administered 2021-12-26: 1000 mL

## 2021-12-26 MED ORDER — CHLORHEXIDINE GLUCONATE 0.12 % MT SOLN
15.0000 mL | Freq: Once | OROMUCOSAL | Status: AC
  Administered 2021-12-26: 15 mL via OROMUCOSAL
  Filled 2021-12-26: qty 15

## 2021-12-26 MED ORDER — LIDOCAINE 2% (20 MG/ML) 5 ML SYRINGE
INTRAMUSCULAR | Status: DC | PRN
  Administered 2021-12-26: 50 mg via INTRAVENOUS

## 2021-12-26 MED ORDER — FENTANYL CITRATE (PF) 100 MCG/2ML IJ SOLN: 25.0000 ug | INTRAMUSCULAR | Status: DC | PRN

## 2021-12-26 MED ORDER — OXYCODONE HCL 5 MG PO TABS: 5.0000 mg | ORAL_TABLET | Freq: Once | ORAL | Status: DC | PRN

## 2021-12-26 MED ORDER — BUPIVACAINE HCL (PF) 0.25 % IJ SOLN
INTRAMUSCULAR | Status: AC
  Filled 2021-12-26: qty 30

## 2021-12-26 MED ORDER — PHENYLEPHRINE 80 MCG/ML (10ML) SYRINGE FOR IV PUSH (FOR BLOOD PRESSURE SUPPORT)
PREFILLED_SYRINGE | INTRAVENOUS | Status: DC | PRN
  Administered 2021-12-26: 320 ug via INTRAVENOUS
  Administered 2021-12-26: 80 ug via INTRAVENOUS

## 2021-12-26 MED ORDER — ONDANSETRON HCL 4 MG/2ML IJ SOLN
INTRAMUSCULAR | Status: DC | PRN
  Administered 2021-12-26: 4 mg via INTRAVENOUS

## 2021-12-26 MED ORDER — BUPIVACAINE HCL (PF) 0.25 % IJ SOLN
INTRAMUSCULAR | Status: DC | PRN
  Administered 2021-12-26: 9 mL

## 2021-12-26 MED ORDER — PROPOFOL 10 MG/ML IV BOLUS
INTRAVENOUS | Status: DC | PRN
Start: 2021-12-26 — End: 2021-12-26
  Administered 2021-12-26: 200 mg via INTRAVENOUS

## 2021-12-26 MED ORDER — ACETAMINOPHEN 325 MG PO TABS: 325.0000 mg | ORAL_TABLET | ORAL | Status: DC | PRN

## 2021-12-26 MED ORDER — OXYCODONE HCL 5 MG/5ML PO SOLN: 5.0000 mg | Freq: Once | ORAL | Status: DC | PRN

## 2021-12-26 MED ORDER — ONDANSETRON HCL 4 MG/2ML IJ SOLN
INTRAMUSCULAR | Status: AC
  Filled 2021-12-26: qty 2

## 2021-12-26 MED ORDER — ORAL CARE MOUTH RINSE: 15.0000 mL | Freq: Once | OROMUCOSAL | Status: AC

## 2021-12-26 SURGICAL SUPPLY — 50 items
APL PRP STRL LF DISP 70% ISPRP (MISCELLANEOUS) ×1
BAG COUNTER SPONGE SURGICOUNT (BAG) ×2 IMPLANT
BAG SPNG CNTER NS LX DISP (BAG) ×1
BNDG CMPR 9X4 STRL LF SNTH (GAUZE/BANDAGES/DRESSINGS) ×1
BNDG ELASTIC 3X5.8 VLCR STR LF (GAUZE/BANDAGES/DRESSINGS) ×2 IMPLANT
BNDG ELASTIC 4X5.8 VLCR STR LF (GAUZE/BANDAGES/DRESSINGS) ×2 IMPLANT
BNDG ESMARK 4X9 LF (GAUZE/BANDAGES/DRESSINGS) ×2 IMPLANT
BNDG GAUZE ELAST 4 BULKY (GAUZE/BANDAGES/DRESSINGS) ×2 IMPLANT
CHLORAPREP W/TINT 26 (MISCELLANEOUS) ×2 IMPLANT
CORD BIPOLAR FORCEPS 12FT (ELECTRODE) ×2 IMPLANT
COVER SURGICAL LIGHT HANDLE (MISCELLANEOUS) ×1 IMPLANT
CUFF TOURN SGL QUICK 18X4 (TOURNIQUET CUFF) ×2 IMPLANT
CUFF TOURN SGL QUICK 24 (TOURNIQUET CUFF)
CUFF TRNQT CYL 24X4X16.5-23 (TOURNIQUET CUFF) IMPLANT
DRAPE SURG 17X23 STRL (DRAPES) ×2 IMPLANT
DRSG PAD ABDOMINAL 8X10 ST (GAUZE/BANDAGES/DRESSINGS) ×3 IMPLANT
DRSG XEROFORM 1X8 (GAUZE/BANDAGES/DRESSINGS) ×1 IMPLANT
GAUZE SPONGE 4X4 12PLY STRL (GAUZE/BANDAGES/DRESSINGS) ×2 IMPLANT
GAUZE SPONGE 4X4 12PLY STRL LF (GAUZE/BANDAGES/DRESSINGS) ×1 IMPLANT
GAUZE XEROFORM 1X8 LF (GAUZE/BANDAGES/DRESSINGS) ×2 IMPLANT
GLOVE BIO SURGEON STRL SZ 6.5 (GLOVE) ×1 IMPLANT
GLOVE BIO SURGEON STRL SZ7.5 (GLOVE) ×4 IMPLANT
GLOVE BIOGEL PI IND STRL 6.5 (GLOVE) IMPLANT
GLOVE BIOGEL PI IND STRL 7.0 (GLOVE) IMPLANT
GLOVE BIOGEL PI IND STRL 8 (GLOVE) ×1 IMPLANT
GLOVE BIOGEL PI INDICATOR 6.5 (GLOVE) ×1
GLOVE BIOGEL PI INDICATOR 7.0 (GLOVE) ×1
GLOVE BIOGEL PI INDICATOR 8 (GLOVE) ×1
GOWN STRL REUS W/ TWL LRG LVL3 (GOWN DISPOSABLE) ×2 IMPLANT
GOWN STRL REUS W/TWL LRG LVL3 (GOWN DISPOSABLE) ×4
KIT BASIN OR (CUSTOM PROCEDURE TRAY) ×2 IMPLANT
KIT TURNOVER KIT B (KITS) ×2 IMPLANT
NDL HYPO 25GX1X1/2 BEV (NEEDLE) IMPLANT
NEEDLE HYPO 25GX1X1/2 BEV (NEEDLE) IMPLANT
NS IRRIG 1000ML POUR BTL (IV SOLUTION) ×2 IMPLANT
PACK ORTHO EXTREMITY (CUSTOM PROCEDURE TRAY) ×2 IMPLANT
PAD ARMBOARD 7.5X6 YLW CONV (MISCELLANEOUS) ×4 IMPLANT
PADDING CAST ABS 4INX4YD NS (CAST SUPPLIES) ×1
PADDING CAST ABS COTTON 4X4 ST (CAST SUPPLIES) ×1 IMPLANT
SPONGE T-LAP 4X18 ~~LOC~~+RFID (SPONGE) ×2 IMPLANT
SUCTION FRAZIER HANDLE 10FR (MISCELLANEOUS)
SUCTION TUBE FRAZIER 10FR DISP (MISCELLANEOUS) IMPLANT
SUT ETHILON 3 0 PS 1 (SUTURE) ×2 IMPLANT
SUT VIC AB 3-0 SH 18 (SUTURE) ×1 IMPLANT
SYR CONTROL 10ML LL (SYRINGE) IMPLANT
TOWEL GREEN STERILE (TOWEL DISPOSABLE) ×2 IMPLANT
TOWEL GREEN STERILE FF (TOWEL DISPOSABLE) ×2 IMPLANT
TUBE CONNECTING 12X1/4 (SUCTIONS) IMPLANT
UNDERPAD 30X36 HEAVY ABSORB (UNDERPADS AND DIAPERS) ×2 IMPLANT
WATER STERILE IRR 1000ML POUR (IV SOLUTION) ×2 IMPLANT

## 2021-12-26 NOTE — Op Note (Signed)
12/26/2021 [facility] OR                              OPERATIVE REPORT   PREOPERATIVE DIAGNOSIS:  Left carpal tunnel syndrome.  POSTOPERATIVE DIAGNOSIS:  Left carpal tunnel syndrome.  PROCEDURE:  Left carpal tunnel release.  SURGEON:  Leanora Cover, MD  ASSISTANT:  none.  ANESTHESIA: General  IV FLUIDS:  Per anesthesia flow sheet.  ESTIMATED BLOOD LOSS:  Minimal.  COMPLICATIONS:  None.  SPECIMENS:  None.  TOURNIQUET TIME:    Total Tourniquet Time Documented: Forearm (Left) - 15 minutes Total: Forearm (Left) - 15 minutes   DISPOSITION:  Stable to PACU.  LOCATION: [facility] OR  INDICATIONS:  76 yo male with numbness and tingling left hand.  Positive nerve conduction studies.  Nocturnal symptoms.  He wishes to have a carpal tunnel release for management of his symptoms.  Risks, benefits and alternatives of surgery were discussed including the risk of blood loss; infection; damage to nerves, vessels, tendons, ligaments, bone; failure of surgery; need for additional surgery; complications with wound healing; continued pain; recurrence of carpal tunnel syndrome; and damage to motor branch. He voiced understanding of these risks and elected to proceed.   OPERATIVE COURSE:  After being identified preoperatively by myself, the patient and I agreed upon the procedure and site of procedure.  The surgical site was marked.  Surgical consent had been signed.  He was given IV Ancef as preoperative antibiotic prophylaxis.  He was transferred to the operating room and placed on the operating room table in supine position with the Left upper extremity on an armboard.  General anesthesia was induced by the anesthesiologist.  Left upper extremity was prepped and draped in normal sterile orthopaedic fashion.  A surgical pause was performed between the surgeons, anesthesia, and operating room staff, and all were in agreement as to the patient, procedure, and site of procedure.  Tourniquet at the proximal aspect of  the extremity was inflated to 250 mmHg after exsanguination of the arm with an Esmarch bandage  Incision was made over the transverse carpal ligament and carried into the subcutaneous tissues by spreading technique.  Bipolar electrocautery was used to obtain hemostasis.  The palmar fascia was sharply incised.  The transverse carpal ligament was identified and sharply incised.  It was incised distally first.  The flexor tendons were identified.  The flexor tendon to the ring finger was identified and retracted radially.  The transverse carpal ligament was then incised proximally.  Scissors were used to split the distal aspect of the volar antebrachial fascia.  A finger was placed into the wound to ensure complete decompression, which was the case.  The nerve was examined.  It was flattened and hyperemic.  The motor branch was identified and was intact.  The wound was copiously irrigated with sterile saline.  It was then closed with 4-0 nylon in a horizontal mattress fashion.  It was injected with 0.25% plain Marcaine to aid in postoperative analgesia.  It was dressed with sterile Xeroform, 4x4s, an ABD, and wrapped with Kerlix and an Ace bandage.  Tourniquet was deflated at 15 minutes.  Fingertips were pink with brisk capillary refill after deflation of the tourniquet.  Operative drapes were broken down.  The patient was awoken from anesthesia safely.  He was transferred back to stretcher and taken to the PACU in stable condition.  I will see him back in the office in 1 week  for postoperative followup.  I will give him a prescription for Tramadol 50 mg 1 tab PO q6 hours prn pain, dispense # 20.    Leanora Cover, MD Electronically signed, 12/26/21

## 2021-12-26 NOTE — H&P (Signed)
Oscar [name]. is an 76 y.o. male.   Chief Complaint: carpal tunnel syndrome HPI: 76 yo male with numbness and tingling left hand.  Positive nerve conduction studies.  Nocturnal symptoms.  He wishes to have left carpal tunnel release.  Allergies:  Allergies  Allergen Reactions   Iohexol Hives and Other (See Comments)     Desc: HIVES S/P 13HR.PREMEDS, ?LOW DOSAGE PREMEDS    Ivp Dye [Iodinated Contrast Media] Hives and Other (See Comments)    welps     Past Medical History:  Diagnosis Date   Anal fissure    h/o - no recent complications   Arthritis    hips   Atrial fibrillation (HCC)    Depression    Diabetes mellitus without complication (HCC)    Type II   Enlarged prostate    GERD (gastroesophageal reflux disease)    Gout    H/O hiatal hernia    Hard of hearing    History of kidney stones    Hx of typhoid fever    as a child   Hypertension    improved after diet/exercise   Memory change 04/14/2017   OSA (obstructive sleep apnea)    wears cpap   Pneumonia    PONV (postoperative nausea and vomiting)    also difficulty waking up   Stroke ([place]) 08/2013   short term memory loss (slight)    Past Surgical History:  Procedure Laterality Date   AMPUTATION Left 10/02/2017   Procedure: LEFT FOOT 1ST RAY AMPUTATION;  Surgeon: Newt Minion, MD;  Location: [place];  Service: Orthopedics;  Laterality: Left;   AMPUTATION Left 01/15/2018   Procedure: LEFT FOOT 2ND TOE AMPUTATION;  Surgeon: Newt Minion, MD;  Location: [place];  Service: Orthopedics;  Laterality: Left;   carpel tunnel Bilateral    CATARACT EXTRACTION W/ INTRAOCULAR LENS  IMPLANT, BILATERAL     CHOLECYSTECTOMY     COLONOSCOPY     EYE SURGERY     laser surgery    I & D EXTREMITY Left 09/15/2014   Procedure: Excision Necrotic Left Achilles, Skin Graft, apply Wound VAC;  Surgeon: Newt Minion, MD;  Location: [place];  Service: Orthopedics;  Laterality: Left;   KIDNEY STONE SURGERY     KNEE ARTHROSCOPY Bilateral     LIGAMENT REPAIR     TONSILLECTOMY     VASECTOMY      Family History: Family History  Problem Relation Age of Onset   COPD Father    Heart attack Sister        died age 16 of MI    Social History:   reports that he has never smoked. He has never used smokeless tobacco. He reports current alcohol use. He reports that he does not use drugs.  Medications: Medications Prior to Admission  Medication Sig Dispense Refill   acetaminophen (TYLENOL) 500 MG tablet Take 500 mg by mouth 2 (two) times daily as needed for moderate pain.     allopurinol (ZYLOPRIM) 300 MG tablet Take 150 mg by mouth daily.  11   buPROPion (WELLBUTRIN SR) 150 MG 12 hr tablet Take 150 mg by mouth daily.  5   Cholecalciferol (VITAMIN D) 50 MCG (2000 UT) tablet Take 4,000 Units by mouth daily.     diltiazem (CARDIZEM CD) 360 MG 24 hr capsule Take 1 capsule (360 mg total) by mouth daily. 90 capsule 0   donepezil (ARICEPT) 10 MG tablet Take 1 at bedtime 90 tablet 4  ELIQUIS 5 MG TABS tablet TAKE 1 TABLET BY MOUTH TWICE A DAY 180 tablet 1   escitalopram (LEXAPRO) 20 MG tablet Take 40 mg by mouth daily.  3   furosemide (LASIX) 80 MG tablet Take 1 tablet (80 mg total) by mouth daily. T 90 tablet 3   indomethacin (INDOCIN) 50 MG capsule Take 1 capsule (50 mg total) by mouth 3 (three) times daily as needed (for gout flare ups ). Reported on 12/27/2015 90 capsule 1   insulin aspart (NOVOLOG) 100 UNIT/ML injection Inject 25-30 Units into the skin 3 (three) times daily as needed for high blood sugar.     JARDIANCE 25 MG TABS tablet Take 25 mg by mouth daily.     LEVEMIR 100 UNIT/ML injection Inject 40 Units into the skin at bedtime.  5   pentoxifylline (TRENTAL) 400 MG CR tablet TAKE 1 TABLET BY MOUTH 3 TIMES DAILY WITH MEALS. (Patient taking differently: Take 400 mg by mouth in the morning and at bedtime.) 270 tablet 1   potassium chloride SA (KLOR-CON M20) 20 MEQ tablet Take 1 tablet (20 mEq total) by mouth 2 (two) times  daily. 180 tablet 1   simvastatin (ZOCOR) 20 MG tablet Take 20 mg by mouth every evening.  3   tamsulosin (FLOMAX) 0.4 MG CAPS capsule Take 1 capsule (0.4 mg total) by mouth daily after breakfast. (Patient taking differently: Take 0.4 mg by mouth 2 (two) times daily.) 30 capsule 0   B-D INS SYRINGE 0.5CC/30GX1/2" 30G X 1/2" 0.5 ML MISC USE THREE TIMES A DAY [id number] INJECTION  2   blood glucose meter kit and supplies KIT Dispense based on patient and insurance preference. Use up to four times daily as directed. (FOR ICD-9 250.00, 250.01). 1 each 0    Results for orders placed or performed during the hospital encounter of 12/26/21 (from the past 48 hour(s))  Glucose, capillary     Status: Abnormal   Collection Time: 12/26/21  7:15 AM  Result Value Ref Range   Glucose-Capillary 152 (H) 70 - 99 mg/dL    Comment: Glucose reference range applies only to samples taken after fasting for at least 8 hours.  CBC per protocol     Status: Abnormal   Collection Time: 12/26/21  7:51 AM  Result Value Ref Range   WBC 7.5 4.0 - 10.5 K/uL   RBC 4.07 (L) 4.22 - 5.81 MIL/uL   Hemoglobin 12.7 (L) 13.0 - 17.0 g/dL   HCT 39.7 39.0 - 52.0 %   MCV 97.5 80.0 - 100.0 fL   MCH 31.2 26.0 - 34.0 pg   MCHC 32.0 30.0 - 36.0 g/dL   RDW 15.9 (H) 11.5 - 15.5 %   Platelets 195 150 - 400 K/uL   nRBC 0.0 0.0 - 0.2 %    Comment: Performed at [facility], [place] [address] [place], [place] [address]    No results found.    Blood pressure 121/79, pulse 80, temperature 97.6 F (36.4 C), temperature source Oral, resp. rate 18, height $RemoveBe'5\' 10"'DDaQBHJFo$  (1.778 m), weight 127 kg, SpO2 94 %.  General appearance: alert, cooperative, and appears stated age Head: Normocephalic, without obvious abnormality, atraumatic Neck: supple, symmetrical, trachea midline Extremities: Intact capillary refill all digits.  Decreased sensation in left hand.  +epl/fpl/io.  No wounds.  Pulses: 2+ and symmetric Skin: Skin color, texture, turgor  normal. No rashes or lesions Neurologic: Grossly normal Incision/Wound: none  Assessment/Plan Left carpal tunnel syndrome.  Non operative and  operative treatment options have been discussed with the patient and patient wishes to proceed with operative treatment. Risks, benefits, and alternatives of surgery have been discussed and the patient agrees with the plan of care.   Leanora Cover 12/26/2021, 8:38 AM

## 2021-12-26 NOTE — Anesthesia Procedure Notes (Signed)
Procedure Name: LMA Insertion Date/Time: 12/26/2021 9:57 AM Performed by: Mariea Clonts, CRNA Pre-anesthesia Checklist: Patient identified, Emergency Drugs available, Suction available and Patient being monitored Patient Re-evaluated:Patient Re-evaluated prior to induction Oxygen Delivery Method: Circle System Utilized Preoxygenation: Pre-oxygenation with 100% oxygen Induction Type: IV induction Ventilation: Mask ventilation without difficulty LMA: LMA inserted LMA Size: 5.0 Number of attempts: 1 Airway Equipment and Method: Bite block Placement Confirmation: positive ETCO2 Tube secured with: Tape Dental Injury: Teeth and Oropharynx as per pre-operative assessment

## 2021-12-26 NOTE — Transfer of Care (Signed)
Immediate Anesthesia Transfer of Care Note  Patient: Zorion Nims.  Procedure(s) Performed: CARPAL TUNNEL RELEASE (Left: Hand)  Patient Location: PACU  Anesthesia Type:General  Level of Consciousness: awake, alert  and oriented  Airway & Oxygen Therapy: Patient Spontanous Breathing and Patient connected to nasal cannula oxygen  Post-op Assessment: Report given to RN and Post -op Vital signs reviewed and stable  Post vital signs: Reviewed and stable  Last Vitals:  Vitals Value Taken Time  BP 120/73 12/26/21 1033  Temp    Pulse 85 12/26/21 1035  Resp 20 12/26/21 1035  SpO2 97 % 12/26/21 1035  Vitals shown include unvalidated device data.  Last Pain:  Vitals:   12/26/21 0743  TempSrc:   PainSc: 0-No pain         Complications: No notable events documented.

## 2021-12-26 NOTE — Discharge Instructions (Signed)

## 2021-12-26 NOTE — Progress Notes (Signed)
Notified Dr. Jillyn Hidden that pt has continuous blood glucose monitor on right arm. Per Dr. Jillyn Hidden it is ok for pt to leave it on since surgery is on the left arm.

## 2021-12-26 NOTE — Anesthesia Postprocedure Evaluation (Signed)
Anesthesia Post Note  Patient: Oscar [name].  Procedure(s) Performed: CARPAL TUNNEL RELEASE (Left: Hand)     Patient location during evaluation: PACU Anesthesia Type: General Level of consciousness: awake Pain management: pain level controlled Vital Signs Assessment: post-procedure vital signs reviewed and stable Respiratory status: spontaneous breathing Cardiovascular status: stable Postop Assessment: no apparent nausea or vomiting Anesthetic complications: no   No notable events documented.  Last Vitals:  Vitals:   12/26/21 1050 12/26/21 1105  BP: 116/73 111/70  Pulse: (!) 58 61  Resp: (!) 21 18  Temp:    SpO2: 92% 93%    Last Pain:  Vitals:   12/26/21 1105  TempSrc:   PainSc: 0-No pain                 Huston Foley

## 2021-12-27 ENCOUNTER — Encounter (HOSPITAL_COMMUNITY): Payer: Self-pay | Admitting: Orthopedic Surgery

## 2022-02-12 ENCOUNTER — Ambulatory Visit: Payer: Medicare Other | Admitting: Neurology

## 2022-02-12 ENCOUNTER — Ambulatory Visit (INDEPENDENT_AMBULATORY_CARE_PROVIDER_SITE_OTHER): Payer: Medicare Other | Admitting: Neurology

## 2022-02-12 ENCOUNTER — Encounter: Payer: Self-pay | Admitting: Neurology

## 2022-02-12 VITALS — BP 119/56 | HR 60 | Ht 69.0 in | Wt 283.1 lb

## 2022-02-12 DIAGNOSIS — R413 Other amnesia: Secondary | ICD-10-CM

## 2022-02-12 MED ORDER — DONEPEZIL HCL 10 MG PO TABS: ORAL_TABLET | ORAL | 4 refills | Status: AC

## 2022-02-12 NOTE — Progress Notes (Signed)
PATIENT: Oscar [name]. DOB: Jul 21, 1946  REASON FOR VISIT: follow up for memory HISTORY FROM: patient, daughter Oscar [name] Primary neurologist: Dr. Jannifer Franklin  HISTORY OF PRESENT ILLNESS: Today 02/12/22  Oscar [name]. here today for follow-up. Had a fall 11/25/21 trying to step over a curb.  On Eliquis.  CT head showed small left parafalcine subdural hematoma.  Neurosurgery recommended holding Eliquis for 1 week. MMSE 26/30.  No problems since.  Denies headache.  Feels memory has remained overall stable.  No changes to his health.  Uses CPAP nightly.  His son lives with him, drives short distances, daughter manages medicines. MMSE 30/30.  02/12/21 SS: [name] is a 76 year old male with history of memory disturbance since stroke.  Remains stable.  MMSE 26/30.  His son continues to live with him.  He does some short distance driving during the daytime to the grocery store or pharmacy.  His daughter manages his medications and finances.  He needs new hearing aids.  He has vision impairments due to diabetes.  His A1c is improved at 7, he is wearing a continuous glucose monitor.  No falls.  Uses CPAP.  Has gotten off track in his diet.  Remains on Aricept, tolerating well.  Sleeping well.  Here today for evaluation accompanied by his daughter, Oscar [name]. Admits he mostly watches TV, he does enjoy being outside, fishing.   Update 02/08/2020 SS: [name] is a 76 year old male with history of memory disturbance since his stroke.  His son lives with him, his wife passed away in 2018-10-30.  He remains on Aricept 10 mg at bedtime.  Feels memory is overall stable, has trouble with short term memory. Thinks some of his memory is related to his poor hearing. His daughter manages his medications and finances.  He has someone who comes to clean every 2 weeks.  He does his own laundry.  He does local driving to the grocery and pharmacy, does this well.  He has started a diet, has lost about 28 lbs. his health is overall  stable, uses CPAP.  No new problems or concerns.  Presents today accompanied by his daughter.  HISTORY  08/09/2019 SS: [name] is a 76 year old male with history of memory disturbance since a stroke that occurred about 5 years ago.  Unfortunately, earlier this year his wife unexpectedly passed away.  He lives with his son.  He is able to perform all of his own ADLs.  He says he does have someone who comes in the home every 2 weeks to help clean.  His daughter manages his medication.  He continues to drive, but does not drive at night or long distances.  He has not had any falls.  He has difficulty hearing.  He has trouble remembering his doctor's appointments.  His daughter manages his finances, but that is more so due to difficulty with his vision.  He remains on Aricept.  He feels his memory is overall stable. He denies any new issues that have arisen. He presents today for evaluation via virtual visit.  REVIEW OF SYSTEMS: Out of a complete 14 system review of symptoms, the patient complains only of the following symptoms, and all other reviewed systems are negative.  See HPI  ALLERGIES: Allergies  Allergen Reactions   Iohexol Hives and Other (See Comments)     Desc: HIVES S/P 13HR.PREMEDS, ?LOW DOSAGE PREMEDS    Ivp Dye [Iodinated Contrast Media] Hives and Other (See Comments)    welps  HOME MEDICATIONS: Outpatient Medications Prior to Visit  Medication Sig Dispense Refill   acetaminophen (TYLENOL) 500 MG tablet Take 500 mg by mouth 2 (two) times daily as needed for moderate pain.     allopurinol (ZYLOPRIM) 300 MG tablet Take 150 mg by mouth daily.  11   B-D INS SYRINGE 0.5CC/30GX1/2" 30G X 1/2" 0.5 ML MISC USE THREE TIMES A DAY DX-E11.65 INJECTION  2   blood glucose meter kit and supplies KIT Dispense based on patient and insurance preference. Use up to four times daily as directed. (FOR ICD-9 250.00, 250.01). 1 each 0   buPROPion (WELLBUTRIN SR) 150 MG 12 hr tablet Take 150 mg by  mouth daily.  5   Cholecalciferol (VITAMIN D) 50 MCG (2000 UT) tablet Take 4,000 Units by mouth daily.     diltiazem (CARDIZEM CD) 360 MG 24 hr capsule Take 1 capsule (360 mg total) by mouth daily. 90 capsule 0   ELIQUIS 5 MG TABS tablet TAKE 1 TABLET BY MOUTH TWICE A DAY 180 tablet 1   escitalopram (LEXAPRO) 20 MG tablet Take 40 mg by mouth daily.  3   furosemide (LASIX) 80 MG tablet Take 1 tablet (80 mg total) by mouth daily. T 90 tablet 3   indomethacin (INDOCIN) 50 MG capsule Take 1 capsule (50 mg total) by mouth 3 (three) times daily as needed (for gout flare ups ). Reported on 12/27/2015 90 capsule 1   insulin aspart (NOVOLOG) 100 UNIT/ML injection Inject 25-30 Units into the skin 3 (three) times daily as needed for high blood sugar.     JARDIANCE 25 MG TABS tablet Take 25 mg by mouth daily.     LEVEMIR 100 UNIT/ML injection Inject 40 Units into the skin at bedtime.  5   pentoxifylline (TRENTAL) 400 MG CR tablet TAKE 1 TABLET BY MOUTH 3 TIMES DAILY WITH MEALS. (Patient taking differently: Take 400 mg by mouth in the morning and at bedtime.) 270 tablet 1   potassium chloride SA (KLOR-CON M20) 20 MEQ tablet Take 1 tablet (20 mEq total) by mouth 2 (two) times daily. 180 tablet 1   simvastatin (ZOCOR) 20 MG tablet Take 20 mg by mouth every evening.  3   tamsulosin (FLOMAX) 0.4 MG CAPS capsule Take 1 capsule (0.4 mg total) by mouth daily after breakfast. (Patient taking differently: Take 0.4 mg by mouth 2 (two) times daily.) 30 capsule 0   donepezil (ARICEPT) 10 MG tablet Take 1 at bedtime 90 tablet 4   traMADol (ULTRAM) 50 MG tablet 1 tab PO q6 hours prn pain (Patient not taking: Reported on 02/12/2022) 20 tablet 0   No facility-administered medications prior to visit.    PAST MEDICAL HISTORY: Past Medical History:  Diagnosis Date   Anal fissure    h/o - no recent complications   Arthritis    hips   Atrial fibrillation (HCC)    Depression    Diabetes mellitus without complication (HCC)     Type II   Enlarged prostate    GERD (gastroesophageal reflux disease)    Gout    H/O hiatal hernia    Hard of hearing    History of kidney stones    Hx of typhoid fever    as a child   Hypertension    improved after diet/exercise   Memory change 04/14/2017   OSA (obstructive sleep apnea)    wears cpap   Pneumonia    PONV (postoperative nausea and vomiting)    also difficulty  waking up   Stroke ([place]) 08/2013   short term memory loss (slight)    PAST SURGICAL HISTORY: Past Surgical History:  Procedure Laterality Date   AMPUTATION Left 10/02/2017   Procedure: LEFT FOOT 1ST RAY AMPUTATION;  Surgeon: Newt Minion, MD;  Location: [place] [facility];  Service: Orthopedics;  Laterality: Left;   AMPUTATION Left 01/15/2018   Procedure: LEFT FOOT 2ND TOE AMPUTATION;  Surgeon: Newt Minion, MD;  Location: [place] [facility];  Service: Orthopedics;  Laterality: Left;   CARPAL TUNNEL RELEASE Left 12/26/2021   Procedure: CARPAL TUNNEL RELEASE;  Surgeon: Leanora Cover, MD;  Location: [facility];  Service: Orthopedics;  Laterality: Left;   carpel tunnel Bilateral    CATARACT EXTRACTION W/ INTRAOCULAR LENS  IMPLANT, BILATERAL     CHOLECYSTECTOMY     COLONOSCOPY     EYE SURGERY     laser surgery    I & D EXTREMITY Left 09/15/2014   Procedure: Excision Necrotic Left Achilles, Skin Graft, apply Wound VAC;  Surgeon: Newt Minion, MD;  Location: [facility];  Service: Orthopedics;  Laterality: Left;   KIDNEY STONE SURGERY     KNEE ARTHROSCOPY Bilateral    LIGAMENT REPAIR     TONSILLECTOMY     VASECTOMY      FAMILY HISTORY: Family History  Problem Relation Age of Onset   COPD Father    Heart attack Sister        died age 90 of MI    SOCIAL HISTORY: Social History   Socioeconomic History   Marital status: Widowed    Spouse name: Sonny Masters"   Number of children: 3   Years of education: 12   Highest education level: Not on file  Occupational History   Occupation: Retired  Tobacco Use   Smoking status: Never    Smokeless tobacco: Never  Vaping Use   Vaping Use: Never used  Substance and Sexual Activity   Alcohol use: Yes    Alcohol/week: 0.0 standard drinks of alcohol    Comment: rare - 1-2x /yr   Drug use: No   Sexual activity: Not on file  Other Topics Concern   Not on file  Social History Narrative   Lives with wife   Caffeine use: Tea sometimes (unsweet)   Right handed    Social Determinants of Health   Financial Resource Strain: Not on file  Food Insecurity: Not on file  Transportation Needs: Not on file  Physical Activity: Not on file  Stress: Not on file  Social Connections: Not on file  Intimate Partner Violence: Not on file   PHYSICAL EXAM  Vitals:   02/12/22 1108  BP: (!) 119/56  Pulse: 60  Weight: 283 lb 2 oz (128.4 kg)  Height: 5\' 9"  (1.753 m)   Body mass index is 41.81 kg/m.  Generalized: Well developed, in no acute distress     02/12/2022   11:39 AM 02/12/2021   11:00 AM 02/08/2020    1:00 PM  MMSE - Mini Mental State Exam  Orientation to time 5 4 4   Orientation to Place 5 4 5   Registration 3 3 3   Attention/ Calculation 5 4 1   Recall 3 2 3   Language- name 2 objects 2 2 2   Language- repeat 1 1 1   Language- follow 3 step command 3 3 3   Language- read & follow direction 1 1 1   Write a sentence 1 1 1   Copy design 1 1 1   Total score 30 26 25  Neurological examination  Mentation: Alert oriented to time, place, history taking. Follows all commands speech and language fluent, smiling, very pleasant, hard of hearing Cranial nerve II-XII: Pupils were equal round reactive to light. Extraocular movements were full, visual field were full on confrontational test. Facial sensation and strength were normal. Head turning and shoulder shrug  were normal and symmetric. Motor: The motor testing reveals 5 over 5 strength of all 4 extremities. Good symmetric motor tone is noted throughout.  Sensory: Sensory testing is intact to soft touch on all 4 extremities. No  evidence of extinction is noted.  Coordination: Cerebellar testing reveals good finger-nose-finger and heel-to-shin bilaterally.  Gait and station: Gait is wide-based, but steady and independent.  DIAGNOSTIC DATA (LABS, IMAGING, TESTING) - I reviewed patient records, labs, notes, testing and imaging myself where available.  Lab Results  Component Value Date   WBC 7.5 12/26/2021   HGB 12.7 (L) 12/26/2021   HCT 39.7 12/26/2021   MCV 97.5 12/26/2021   PLT 195 12/26/2021      Component Value Date/Time   NA 139 12/26/2021 0751   K 4.1 12/26/2021 0751   CL 107 12/26/2021 0751   CO2 23 12/26/2021 0751   GLUCOSE 159 (H) 12/26/2021 0751   BUN 39 (H) 12/26/2021 0751   CREATININE 2.35 (H) 12/26/2021 0751   CREATININE 1.57 (H) 06/04/2015 0958   CALCIUM 9.1 12/26/2021 0751   PROT 6.9 09/07/2016 2124   ALBUMIN 3.3 (L) 09/07/2016 2124   AST 200 (H) 09/07/2016 2124   ALT 98 (H) 09/07/2016 2124   ALKPHOS 44 09/07/2016 2124   BILITOT 1.0 09/07/2016 2124   GFRNONAA 28 (L) 12/26/2021 0751   GFRAA 46 (L) 01/15/2018 1107   Lab Results  Component Value Date   CHOL 200 08/07/2013   HDL 30 (L) 08/07/2013   LDLCALC 125 (H) 08/07/2013   TRIG 225 (H) 08/07/2013   CHOLHDL 6.7 08/07/2013   Lab Results  Component Value Date   HGBA1C 10.1 (H) 09/08/2016   Lab Results  Component Value Date   VITAMINB12 448 04/14/2017   Lab Results  Component Value Date   TSH 3.093 06/04/2015   ASSESSMENT AND PLAN 76 y.o. year old male   1.  Memory disturbance 2.  History of stroke  -Has remained overall stable, MMSE 30/30 today -No changes to memory, continue Aricept 10 mg at bedtime -We talked about Namenda, may consider in the future if decline, will follow-up with PCP for memory/management of vascular risk factors, return here as needed  Butler Denmark, AGNP-C, DNP 02/12/2022, 11:43 AM [facility] [address], [place], [place] [address] [phone]

## 2022-02-12 NOTE — Patient Instructions (Signed)
Great to see you today  Continue the Aricept  Continue to see primary care, return here as needed

## 2022-04-22 ENCOUNTER — Ambulatory Visit (INDEPENDENT_AMBULATORY_CARE_PROVIDER_SITE_OTHER): Payer: Medicare Other | Admitting: Family

## 2022-04-22 DIAGNOSIS — L97521 Non-pressure chronic ulcer of other part of left foot limited to breakdown of skin: Secondary | ICD-10-CM

## 2022-04-22 DIAGNOSIS — M6702 Short Achilles tendon (acquired), left ankle: Secondary | ICD-10-CM | POA: Diagnosis not present

## 2022-04-22 DIAGNOSIS — Z89412 Acquired absence of left great toe: Secondary | ICD-10-CM

## 2022-04-25 ENCOUNTER — Encounter: Payer: Self-pay | Admitting: Family

## 2022-04-25 NOTE — Progress Notes (Signed)
Office Visit Note   Patient: Oscar [name].           Date of Birth: August 28, 1945           MRN: [id number] Visit Date: 04/22/2022              Requested by: Josetta Huddle, MD [address] [place],  [place] [address] PCP: Josetta Huddle, MD  Chief Complaint  Patient presents with   Left Foot - Wound Check      HPI: The patient is a 76 year old gentleman who presents today for initial evaluation of a new wound to his left foot he is status post first ray amputation as well as second toe amputation and has developed blistered ulceration beneath the medial column  He is currently in [facility] supportive shoewear however endorses walking barefoot for the majority of his time does not wear shoes in the home.  Denies any fever or chills  Assessment & Plan: Visit Diagnoses: No diagnosis found.  Plan: They will continue with daily dose of cleansing.  Mupirocin dressings.  Encouraged him to stay in shoewear whenever weightbearing have modified the insert of his shoe to offload the ulcerative area  Follow-Up Instructions: Return in about 4 weeks (around 05/20/2022).   Ortho Exam  Patient is alert, oriented, no adenopathy, well-dressed, normal affect, normal respiratory effort. On examination of the left foot beneath the third metatarsal head there is callused ulceration this was debrided with a 10 blade knife back to viable tissue there is a 3 cm in diameter ulceration this is 5 mm deep there is granulation in the wound bed there is no fibrinous exudative tissue there is no purulence no odor no surrounding erythema or warmth this does not probe  Imaging: No results found. No images are attached to the encounter.  Labs: Lab Results  Component Value Date   HGBA1C 10.1 (H) 09/08/2016   HGBA1C 9.1 (H) 08/28/2014   HGBA1C 10.1 (H) 08/07/2013   ESRSEDRATE 75 (H) 08/28/2014   ESRSEDRATE 42 (H) 11/17/2012   CRP 12.9 (H) 08/28/2014   LABURIC 3.0 (L) 08/31/2014   REPTSTATUS  10/03/2017 FINAL 09/30/2017   GRAMSTAIN  09/30/2017    RARE WBC PRESENT, PREDOMINANTLY MONONUCLEAR NO ORGANISMS SEEN    CULT  09/30/2017    NO GROWTH 2 DAYS Performed at [facility], [place] [address] [place],  [address]      Lab Results  Component Value Date   ALBUMIN 3.3 (L) 09/07/2016   ALBUMIN 3.6 09/15/2014   ALBUMIN 3.1 (L) 08/29/2014    No results found for: "MG" No results found for: "VD25OH"  No results found for: "PREALBUMIN"    Latest Ref Rng & Units 12/26/2021    7:51 AM 01/15/2018   11:07 AM 09/30/2017   11:15 PM  CBC EXTENDED  WBC 4.0 - 10.5 K/uL 7.5  9.3  9.1   RBC 4.22 - 5.81 MIL/uL 4.07  4.22  4.04   Hemoglobin 13.0 - 17.0 g/dL 12.7  12.0  12.1   HCT 39.0 - 52.0 % 39.7  38.0  37.3   Platelets 150 - 400 K/uL 195  209  219   NEUT# 1.7 - 7.7 K/uL   6.6   Lymph# 0.7 - 4.0 K/uL   1.6      There is no height or weight on file to calculate BMI.  Orders:  No orders of the defined types were placed in this encounter.  No orders  of the defined types were placed in this encounter.    Procedures: No procedures performed  Clinical Data: No additional findings.  ROS:  All other systems negative, except as noted in the HPI. Review of Systems  Objective: Vital Signs: There were no vitals taken for this visit.  Specialty Comments:  No specialty comments available.  PMFS History: Patient Active Problem List   Diagnosis Date Noted   Pain in left foot 04/21/2018   PVD (peripheral vascular disease) ([place]) 04/21/2018   Amputated toe, left ([place]) 01/21/2018   Achilles tendon contracture, left 12/28/2017   Dehiscence of amputation stump ([place]) 11/10/2017   Great toe amputation status, left 11/10/2017   Memory change 04/14/2017   Cutaneous abscess of left foot 04/09/2017   Non-pressure chronic ulcer of right heel and midfoot limited to breakdown of skin ([place]) 10/07/2016   Influenza A    AKI (acute kidney injury) ([place]) 09/07/2016   Acute  encephalopathy 09/07/2016   Acute respiratory failure with hypoxia ([place]) 09/07/2016   Elevated lactic acid level 09/07/2016   Elevated troponin 09/07/2016   Coronary artery calcification seen on CT scan 10/28/2015   Acute on chronic diastolic (congestive) heart failure ([place]) 09/26/2015   OSA (obstructive sleep apnea) 02/07/2015   Sepsis ([place]) 08/28/2014   Chronic diastolic heart failure ([place]) 09/21/2013   CVA (cerebral infarction) 08/08/2013   Dizziness 08/07/2013   TIA (transient ischemic attack) 08/07/2013   Atrial fibrillation ([place]) 08/07/2013   Hypertension 08/07/2013   Anemia 08/07/2013   Dyspnea 06/21/2013   Chest pain 11/16/2012   Atrial fibrillation with RVR ([place]) 11/16/2012   Gout 11/16/2012   Diabetes mellitus type 2, uncontrolled, with complications ([place]) 52/84/1324   CKD (chronic kidney disease), stage II 11/16/2012   Atypical atrial flutter ([place]) 11/16/2012   Pleuritic chest pain 11/16/2012   Morbid obesity ([place]) 11/16/2012   Obstructive sleep apnea 11/16/2012   Past Medical History:  Diagnosis Date   Anal fissure    h/o - no recent complications   Arthritis    hips   Atrial fibrillation (HCC)    Depression    Diabetes mellitus without complication ([place])    Type II   Enlarged prostate    GERD (gastroesophageal reflux disease)    Gout    H/O hiatal hernia    Hard of hearing    History of kidney stones    Hx of typhoid fever    as a child   Hypertension    improved after diet/exercise   Memory change 04/14/2017   OSA (obstructive sleep apnea)    wears cpap   Pneumonia    PONV (postoperative nausea and vomiting)    also difficulty waking up   Stroke ([place]) 08/2013   short term memory loss (slight)    Family History  Problem Relation Age of Onset   COPD Father    Heart attack Sister        died age 34 of MI    Past Surgical History:  Procedure Laterality Date   AMPUTATION Left 10/02/2017   Procedure: LEFT FOOT 1ST RAY AMPUTATION;  Surgeon: Newt Minion, MD;  Location: [facility];  Service: Orthopedics;  Laterality: Left;   AMPUTATION Left 01/15/2018   Procedure: LEFT FOOT 2ND TOE AMPUTATION;  Surgeon: Newt Minion, MD;  Location: [facility];  Service: Orthopedics;  Laterality: Left;   CARPAL TUNNEL RELEASE Left 12/26/2021   Procedure: CARPAL TUNNEL RELEASE;  Surgeon: Leanora Cover, MD;  Location: [facility];  Service:  Orthopedics;  Laterality: Left;   carpel tunnel Bilateral    CATARACT EXTRACTION W/ INTRAOCULAR LENS  IMPLANT, BILATERAL     CHOLECYSTECTOMY     COLONOSCOPY     EYE SURGERY     laser surgery    I & D EXTREMITY Left 09/15/2014   Procedure: Excision Necrotic Left Achilles, Skin Graft, apply Wound VAC;  Surgeon: Newt Minion, MD;  Location: [facility];  Service: Orthopedics;  Laterality: Left;   KIDNEY STONE SURGERY     KNEE ARTHROSCOPY Bilateral    LIGAMENT REPAIR     TONSILLECTOMY     VASECTOMY     Social History   Occupational History   Occupation: Retired  Tobacco Use   Smoking status: Never   Smokeless tobacco: Never  Vaping Use   Vaping Use: Never used  Substance and Sexual Activity   Alcohol use: Yes    Alcohol/week: 0.0 standard drinks of alcohol    Comment: rare - 1-2x /yr   Drug use: No   Sexual activity: Not on file

## 2022-05-20 ENCOUNTER — Encounter: Payer: Self-pay | Admitting: Family

## 2022-05-20 ENCOUNTER — Ambulatory Visit (INDEPENDENT_AMBULATORY_CARE_PROVIDER_SITE_OTHER): Payer: Medicare Other | Admitting: Family

## 2022-05-20 DIAGNOSIS — M6702 Short Achilles tendon (acquired), left ankle: Secondary | ICD-10-CM | POA: Diagnosis not present

## 2022-05-20 DIAGNOSIS — L97521 Non-pressure chronic ulcer of other part of left foot limited to breakdown of skin: Secondary | ICD-10-CM

## 2022-05-20 NOTE — Progress Notes (Signed)
Office Visit Note   Patient: Oscar [name].           Date of Birth: 1945/11/27           MRN: [id number] Visit Date: 05/20/2022              Requested by: Josetta Huddle, MD [address] [place],  [place] [address] PCP: Josetta Huddle, MD  Chief Complaint  Patient presents with   Left Foot - Wound Check      HPI: The patient is a 76 year old gentleman who presents today in follow-up for Wagner grade 1 ulcer to the left foot.  He has continued his Hoka shoe wear with pressure relieving felt padding.  Has been doing daily Dial soap cleansing and dry dressings.  Denies any fever or chills  Assessment & Plan: Visit Diagnoses: No diagnosis found.  Plan: They will continue with daily dial soap cleansing.  Mupirocin dressings.  Encouraged him to stay in shoewear whenever weightbearing have modified the insert of his shoe to offload the ulcerative area  Follow-Up Instructions: Return in about 4 weeks (around 06/17/2022).   Ortho Exam  Patient is alert, oriented, no adenopathy, well-dressed, normal affect, normal respiratory effort. On examination of the left foot beneath the third metatarsal head there is callused ulceration this was debrided with a 10 blade knife back to viable tissue there is a 1 cm in diameter ulceration this is 2  mm deep there is granulation in the wound bed there is no fibrinous exudative tissue there is no purulence no odor no surrounding erythema or warmth this does not probe  Imaging: No results found. No images are attached to the encounter.  Labs: Lab Results  Component Value Date   HGBA1C 10.1 (H) 09/08/2016   HGBA1C 9.1 (H) 08/28/2014   HGBA1C 10.1 (H) 08/07/2013   ESRSEDRATE 75 (H) 08/28/2014   ESRSEDRATE 42 (H) 11/17/2012   CRP 12.9 (H) 08/28/2014   LABURIC 3.0 (L) 08/31/2014   REPTSTATUS 10/03/2017 FINAL 09/30/2017   GRAMSTAIN  09/30/2017    RARE WBC PRESENT, PREDOMINANTLY MONONUCLEAR NO ORGANISMS SEEN    CULT  09/30/2017     NO GROWTH 2 DAYS Performed at [facility], [place] [address] [place], [place] [address]      Lab Results  Component Value Date   ALBUMIN 3.3 (L) 09/07/2016   ALBUMIN 3.6 09/15/2014   ALBUMIN 3.1 (L) 08/29/2014    No results found for: "MG" No results found for: "VD25OH"  No results found for: "PREALBUMIN"    Latest Ref Rng & Units 12/26/2021    7:51 AM 01/15/2018   11:07 AM 09/30/2017   11:15 PM  CBC EXTENDED  WBC 4.0 - 10.5 K/uL 7.5  9.3  9.1   RBC 4.22 - 5.81 MIL/uL 4.07  4.22  4.04   Hemoglobin 13.0 - 17.0 g/dL 12.7  12.0  12.1   HCT 39.0 - 52.0 % 39.7  38.0  37.3   Platelets 150 - 400 K/uL 195  209  219   NEUT# 1.7 - 7.7 K/uL   6.6   Lymph# 0.7 - 4.0 K/uL   1.6      There is no height or weight on file to calculate BMI.  Orders:  No orders of the defined types were placed in this encounter.  No orders of the defined types were placed in this encounter.    Procedures: No procedures performed  Clinical Data: No additional findings.  ROS:  All other systems negative, except as noted in the HPI. Review of Systems  Objective: Vital Signs: There were no vitals taken for this visit.  Specialty Comments:  No specialty comments available.  PMFS History: Patient Active Problem List   Diagnosis Date Noted   Pain in left foot 04/21/2018   PVD (peripheral vascular disease) ([place]) 04/21/2018   Amputated toe, left ([place]) 01/21/2018   Achilles tendon contracture, left 12/28/2017   Dehiscence of amputation stump ([place]) 11/10/2017   Great toe amputation status, left 11/10/2017   Memory change 04/14/2017   Cutaneous abscess of left foot 04/09/2017   Non-pressure chronic ulcer of right heel and midfoot limited to breakdown of skin ([place]) 10/07/2016   Influenza A    AKI (acute kidney injury) ([place]) 09/07/2016   Acute encephalopathy 09/07/2016   Acute respiratory failure with hypoxia ([place]) 09/07/2016   Elevated lactic acid level 09/07/2016   Elevated troponin  09/07/2016   Coronary artery calcification seen on CT scan 10/28/2015   Acute on chronic diastolic (congestive) heart failure ([place]) 09/26/2015   OSA (obstructive sleep apnea) 02/07/2015   Sepsis ([place]) 08/28/2014   Chronic diastolic heart failure ([place]) 09/21/2013   CVA (cerebral infarction) 08/08/2013   Dizziness 08/07/2013   TIA (transient ischemic attack) 08/07/2013   Atrial fibrillation ([place]) 08/07/2013   Hypertension 08/07/2013   Anemia 08/07/2013   Dyspnea 06/21/2013   Chest pain 11/16/2012   Atrial fibrillation with RVR ([place]) 11/16/2012   Gout 11/16/2012   Diabetes mellitus type 2, uncontrolled, with complications ([place]) 59/56/3875   CKD (chronic kidney disease), stage II 11/16/2012   Atypical atrial flutter ([place]) 11/16/2012   Pleuritic chest pain 11/16/2012   Morbid obesity ([place]) 11/16/2012   Obstructive sleep apnea 11/16/2012   Past Medical History:  Diagnosis Date   Anal fissure    h/o - no recent complications   Arthritis    hips   Atrial fibrillation (HCC)    Depression    Diabetes mellitus without complication ([place])    Type II   Enlarged prostate    GERD (gastroesophageal reflux disease)    Gout    H/O hiatal hernia    Hard of hearing    History of kidney stones    Hx of typhoid fever    as a child   Hypertension    improved after diet/exercise   Memory change 04/14/2017   OSA (obstructive sleep apnea)    wears cpap   Pneumonia    PONV (postoperative nausea and vomiting)    also difficulty waking up   Stroke ([place]) 08/2013   short term memory loss (slight)    Family History  Problem Relation Age of Onset   COPD Father    Heart attack Sister        died age 62 of MI    Past Surgical History:  Procedure Laterality Date   AMPUTATION Left 10/02/2017   Procedure: LEFT FOOT 1ST RAY AMPUTATION;  Surgeon: Newt Minion, MD;  Location: [facility];  Service: Orthopedics;  Laterality: Left;   AMPUTATION Left 01/15/2018   Procedure: LEFT FOOT 2ND TOE AMPUTATION;   Surgeon: Newt Minion, MD;  Location: [facility];  Service: Orthopedics;  Laterality: Left;   CARPAL TUNNEL RELEASE Left 12/26/2021   Procedure: CARPAL TUNNEL RELEASE;  Surgeon: Leanora Cover, MD;  Location: [facility];  Service: Orthopedics;  Laterality: Left;   carpel tunnel Bilateral    CATARACT EXTRACTION W/ INTRAOCULAR LENS  IMPLANT, BILATERAL  CHOLECYSTECTOMY     COLONOSCOPY     EYE SURGERY     laser surgery    I & D EXTREMITY Left 09/15/2014   Procedure: Excision Necrotic Left Achilles, Skin Graft, apply Wound VAC;  Surgeon: Newt Minion, MD;  Location: [facility];  Service: Orthopedics;  Laterality: Left;   KIDNEY STONE SURGERY     KNEE ARTHROSCOPY Bilateral    LIGAMENT REPAIR     TONSILLECTOMY     VASECTOMY     Social History   Occupational History   Occupation: Retired  Tobacco Use   Smoking status: Never   Smokeless tobacco: Never  Vaping Use   Vaping Use: Never used  Substance and Sexual Activity   Alcohol use: Yes    Alcohol/week: 0.0 standard drinks of alcohol    Comment: rare - 1-2x /yr   Drug use: No   Sexual activity: Not on file

## 2022-06-17 ENCOUNTER — Encounter: Payer: Self-pay | Admitting: Family

## 2022-06-17 ENCOUNTER — Ambulatory Visit (INDEPENDENT_AMBULATORY_CARE_PROVIDER_SITE_OTHER): Payer: Medicare Other | Admitting: Family

## 2022-06-17 ENCOUNTER — Ambulatory Visit: Payer: Self-pay

## 2022-06-17 DIAGNOSIS — L97521 Non-pressure chronic ulcer of other part of left foot limited to breakdown of skin: Secondary | ICD-10-CM

## 2022-06-17 DIAGNOSIS — Z89412 Acquired absence of left great toe: Secondary | ICD-10-CM

## 2022-06-17 MED ORDER — DOXYCYCLINE HYCLATE 100 MG PO TABS: 100.0000 mg | ORAL_TABLET | Freq: Two times a day (BID) | ORAL | 0 refills | Status: DC

## 2022-06-17 NOTE — Progress Notes (Signed)
Office Visit Note   Patient: Oscar [name].           Date of Birth: 1945/10/10           MRN: [id number] Visit Date: 06/17/2022              Requested by: Josetta Huddle, MD [address] [place],  [place] [address] PCP: Josetta Huddle, MD  Chief Complaint  Patient presents with   Left Foot - Follow-up      HPI: The patient is a 76 year old gentleman who presents in follow-up for Wagner grade 1 ulcer beneath the second metatarsal head.  The first ray is surgically absent.  He and his daughter are concerned for lack of improvement in this wound he denies any fever chills or any drainage  Assessment & Plan: Visit Diagnoses:  1. Skin ulcer of plantar aspect of foot, left, limited to breakdown of skin ([place])     Plan: Concern for osteomyelitis in the second metatarsal head.  We will place him on a course of oral antibiotics discussed the importance of offloading pressure from the forefoot.  Minimize weightbearing he will follow-up in 2 weeks.  Will also refer to vascular for abi, concern for poor healing in Diabetic patient.  Follow-Up Instructions: No follow-ups on file.   Ortho Exam  Patient is alert, oriented, no adenopathy, well-dressed, normal affect, normal respiratory effort. On examination of left foot ulcer beneath the 2nd metatarsal head which is 2 cm in diameter with callus. Was debrided of nonviable tissue back to viable tissue. Ulcer probes 7 mm deep. Does not probe to bone. No surrounding erythema or warmth. No purulence. Palpable DP pulse.  Imaging: No results found. No images are attached to the encounter.  Labs: Lab Results  Component Value Date   HGBA1C 10.1 (H) 09/08/2016   HGBA1C 9.1 (H) 08/28/2014   HGBA1C 10.1 (H) 08/07/2013   ESRSEDRATE 75 (H) 08/28/2014   ESRSEDRATE 42 (H) 11/17/2012   CRP 12.9 (H) 08/28/2014   LABURIC 3.0 (L) 08/31/2014   REPTSTATUS 10/03/2017 FINAL 09/30/2017   GRAMSTAIN  09/30/2017    RARE WBC PRESENT,  PREDOMINANTLY MONONUCLEAR NO ORGANISMS SEEN    CULT  09/30/2017    NO GROWTH 2 DAYS Performed at [facility], [place] [address], [place], [place] [address]      Lab Results  Component Value Date   ALBUMIN 3.3 (L) 09/07/2016   ALBUMIN 3.6 09/15/2014   ALBUMIN 3.1 (L) 08/29/2014    No results found for: "MG" No results found for: "VD25OH"  No results found for: "PREALBUMIN"    Latest Ref Rng & Units 12/26/2021    7:51 AM 01/15/2018   11:07 AM 09/30/2017   11:15 PM  CBC EXTENDED  WBC 4.0 - 10.5 K/uL 7.5  9.3  9.1   RBC 4.22 - 5.81 MIL/uL 4.07  4.22  4.04   Hemoglobin 13.0 - 17.0 g/dL 12.7  12.0  12.1   HCT 39.0 - 52.0 % 39.7  38.0  37.3   Platelets 150 - 400 K/uL 195  209  219   NEUT# 1.7 - 7.7 K/uL   6.6   Lymph# 0.7 - 4.0 K/uL   1.6      There is no height or weight on file to calculate BMI.  Orders:  Orders Placed This Encounter  Procedures   XR Foot 2 Views Left   No orders of the defined types were placed in this encounter.  Procedures: No procedures performed  Clinical Data: No additional findings.  ROS:  All other systems negative, except as noted in the HPI. Review of Systems  Objective: Vital Signs: There were no vitals taken for this visit.  Specialty Comments:  No specialty comments available.  PMFS History: Patient Active Problem List   Diagnosis Date Noted   Pain in left foot 04/21/2018   PVD (peripheral vascular disease) ([place]) 04/21/2018   Amputated toe, left ([place]) 01/21/2018   Achilles tendon contracture, left 12/28/2017   Dehiscence of amputation stump ([place]) 11/10/2017   Great toe amputation status, left 11/10/2017   Memory change 04/14/2017   Cutaneous abscess of left foot 04/09/2017   Non-pressure chronic ulcer of right heel and midfoot limited to breakdown of skin ([place]) 10/07/2016   Influenza A    AKI (acute kidney injury) ([place]) 09/07/2016   Acute encephalopathy 09/07/2016   Acute respiratory failure with hypoxia ([place])  09/07/2016   Elevated lactic acid level 09/07/2016   Elevated troponin 09/07/2016   Coronary artery calcification seen on CT scan 10/28/2015   Acute on chronic diastolic (congestive) heart failure ([place]) 09/26/2015   OSA (obstructive sleep apnea) 02/07/2015   Sepsis ([place]) 08/28/2014   Chronic diastolic heart failure ([place]) 09/21/2013   CVA (cerebral infarction) 08/08/2013   Dizziness 08/07/2013   TIA (transient ischemic attack) 08/07/2013   Atrial fibrillation ([place]) 08/07/2013   Hypertension 08/07/2013   Anemia 08/07/2013   Dyspnea 06/21/2013   Chest pain 11/16/2012   Atrial fibrillation with RVR ([place]) 11/16/2012   Gout 11/16/2012   Diabetes mellitus type 2, uncontrolled, with complications ([place]) 86/76/1950   CKD (chronic kidney disease), stage II 11/16/2012   Atypical atrial flutter ([place]) 11/16/2012   Pleuritic chest pain 11/16/2012   Morbid obesity ([place]) 11/16/2012   Obstructive sleep apnea 11/16/2012   Past Medical History:  Diagnosis Date   Anal fissure    h/o - no recent complications   Arthritis    hips   Atrial fibrillation (HCC)    Depression    Diabetes mellitus without complication ([place])    Type II   Enlarged prostate    GERD (gastroesophageal reflux disease)    Gout    H/O hiatal hernia    Hard of hearing    History of kidney stones    Hx of typhoid fever    as a child   Hypertension    improved after diet/exercise   Memory change 04/14/2017   OSA (obstructive sleep apnea)    wears cpap   Pneumonia    PONV (postoperative nausea and vomiting)    also difficulty waking up   Stroke ([place]) 08/2013   short term memory loss (slight)    Family History  Problem Relation Age of Onset   COPD Father    Heart attack Sister        died age 52 of MI    Past Surgical History:  Procedure Laterality Date   AMPUTATION Left 10/02/2017   Procedure: LEFT FOOT 1ST RAY AMPUTATION;  Surgeon: Newt Minion, MD;  Location: [facility];  Service: Orthopedics;  Laterality: Left;    AMPUTATION Left 01/15/2018   Procedure: LEFT FOOT 2ND TOE AMPUTATION;  Surgeon: Newt Minion, MD;  Location: [facility];  Service: Orthopedics;  Laterality: Left;   CARPAL TUNNEL RELEASE Left 12/26/2021   Procedure: CARPAL TUNNEL RELEASE;  Surgeon: Leanora Cover, MD;  Location: [facility];  Service: Orthopedics;  Laterality: Left;   carpel tunnel Bilateral  CATARACT EXTRACTION W/ INTRAOCULAR LENS  IMPLANT, BILATERAL     CHOLECYSTECTOMY     COLONOSCOPY     EYE SURGERY     laser surgery    I & D EXTREMITY Left 09/15/2014   Procedure: Excision Necrotic Left Achilles, Skin Graft, apply Wound VAC;  Surgeon: Newt Minion, MD;  Location: [facility];  Service: Orthopedics;  Laterality: Left;   KIDNEY STONE SURGERY     KNEE ARTHROSCOPY Bilateral    LIGAMENT REPAIR     TONSILLECTOMY     VASECTOMY     Social History   Occupational History   Occupation: Retired  Tobacco Use   Smoking status: Never   Smokeless tobacco: Never  Vaping Use   Vaping Use: Never used  Substance and Sexual Activity   Alcohol use: Yes    Alcohol/week: 0.0 standard drinks of alcohol    Comment: rare - 1-2x /yr   Drug use: No   Sexual activity: Not on file

## 2022-06-23 ENCOUNTER — Ambulatory Visit (HOSPITAL_COMMUNITY)
Admission: RE | Admit: 2022-06-23 | Discharge: 2022-06-23 | Disposition: A | Discharge status: Home / Self Care | Payer: Medicare Other | Source: Ambulatory Visit | Attending: Surgery | Admitting: Surgery

## 2022-06-23 ENCOUNTER — Other Ambulatory Visit (HOSPITAL_COMMUNITY): Payer: Self-pay | Admitting: Family

## 2022-06-23 DIAGNOSIS — L97309 Non-pressure chronic ulcer of unspecified ankle with unspecified severity: Secondary | ICD-10-CM

## 2022-06-25 ENCOUNTER — Encounter: Payer: Self-pay | Admitting: Vascular Surgery

## 2022-06-25 ENCOUNTER — Ambulatory Visit (INDEPENDENT_AMBULATORY_CARE_PROVIDER_SITE_OTHER): Payer: Medicare Other | Admitting: Vascular Surgery

## 2022-06-25 VITALS — BP 95/55 | HR 68 | Temp 97.9°F | Resp 20 | Ht 69.0 in | Wt 273.0 lb

## 2022-06-25 DIAGNOSIS — E08621 Diabetes mellitus due to underlying condition with foot ulcer: Secondary | ICD-10-CM

## 2022-06-25 DIAGNOSIS — L97521 Non-pressure chronic ulcer of other part of left foot limited to breakdown of skin: Secondary | ICD-10-CM | POA: Diagnosis not present

## 2022-06-25 NOTE — Progress Notes (Signed)
Patient ID: Oscar Hattabaugh., male   DOB: November 04, 1945, 77 y.o.   MRN: [id number]  Reason for Consult: New Patient (Initial Visit)   Referred by Suzan Slick, NP  Subjective:     HPI:  Oscar Querry. is a 76 y.o. male with history of diabetes status post amputation of first 2 toes on the left no amputations on the right.  Denies any history of vascular disease.  Sister did coronary artery disease at the age of 67.  He denies any personal or family history of aneurysm disease in the has no history of carotid artery disease.  He now has a wound on the plantar aspect of his left foot as well as development of wound on the third toe on the left.  He continues to walk.  Past Medical History:  Diagnosis Date   Anal fissure    h/o - no recent complications   Arthritis    hips   Atrial fibrillation (HCC)    Depression    Diabetes mellitus without complication (HCC)    Type II   Enlarged prostate    GERD (gastroesophageal reflux disease)    Gout    H/O hiatal hernia    Hard of hearing    History of kidney stones    Hx of typhoid fever    as a child   Hypertension    improved after diet/exercise   Memory change 04/14/2017   OSA (obstructive sleep apnea)    wears cpap   Pneumonia    PONV (postoperative nausea and vomiting)    also difficulty waking up   Stroke ([place]) 08/2013   short term memory loss (slight)   Family History  Problem Relation Age of Onset   COPD Father    Heart attack Sister        died age 63 of MI   Past Surgical History:  Procedure Laterality Date   AMPUTATION Left 10/02/2017   Procedure: LEFT FOOT 1ST RAY AMPUTATION;  Surgeon: Newt Minion, MD;  Location: [facility];  Service: Orthopedics;  Laterality: Left;   AMPUTATION Left 01/15/2018   Procedure: LEFT FOOT 2ND TOE AMPUTATION;  Surgeon: Newt Minion, MD;  Location: [facility];  Service: Orthopedics;  Laterality: Left;   CARPAL TUNNEL RELEASE Left 12/26/2021   Procedure: CARPAL TUNNEL RELEASE;  Surgeon:  Leanora Cover, MD;  Location: [facility];  Service: Orthopedics;  Laterality: Left;   carpel tunnel Bilateral    CATARACT EXTRACTION W/ INTRAOCULAR LENS  IMPLANT, BILATERAL     CHOLECYSTECTOMY     COLONOSCOPY     EYE SURGERY     laser surgery    I & D EXTREMITY Left 09/15/2014   Procedure: Excision Necrotic Left Achilles, Skin Graft, apply Wound VAC;  Surgeon: Newt Minion, MD;  Location: [facility];  Service: Orthopedics;  Laterality: Left;   KIDNEY STONE SURGERY     KNEE ARTHROSCOPY Bilateral    LIGAMENT REPAIR     TONSILLECTOMY     VASECTOMY      Short Social History:  Social History   Tobacco Use   Smoking status: Never   Smokeless tobacco: Never  Substance Use Topics   Alcohol use: Yes    Alcohol/week: 0.0 standard drinks of alcohol    Comment: rare - 1-2x /yr    Allergies  Allergen Reactions   Iohexol Hives and Other (See Comments)     Desc: HIVES S/P 13HR.PREMEDS, ?LOW DOSAGE PREMEDS  Ivp Dye [Iodinated Contrast Media] Hives and Other (See Comments)    welps    Sulfamethoxazole-Trimethoprim     Other Reaction(s): Other (See Comments)    Current Outpatient Medications  Medication Sig Dispense Refill   acetaminophen (TYLENOL) 500 MG tablet Take 500 mg by mouth 2 (two) times daily as needed for moderate pain.     allopurinol (ZYLOPRIM) 300 MG tablet Take 150 mg by mouth daily.  11   B-D INS SYRINGE 0.5CC/30GX1/2" 30G X 1/2" 0.5 ML MISC USE THREE TIMES A DAY DX-E11.65 INJECTION  2   blood glucose meter kit and supplies KIT Dispense based on patient and insurance preference. Use up to four times daily as directed. (FOR ICD-9 250.00, 250.01). 1 each 0   buPROPion (WELLBUTRIN SR) 150 MG 12 hr tablet Take 150 mg by mouth daily.  5   Cholecalciferol (VITAMIN D) 50 MCG (2000 UT) tablet Take 4,000 Units by mouth daily.     diltiazem (CARDIZEM CD) 360 MG 24 hr capsule Take 1 capsule (360 mg total) by mouth daily. 90 capsule 0   donepezil (ARICEPT) 10 MG tablet Take 1 at bedtime 90  tablet 4   doxycycline (VIBRA-TABS) 100 MG tablet Take 1 tablet (100 mg total) by mouth 2 (two) times daily. 60 tablet 0   ELIQUIS 5 MG TABS tablet TAKE 1 TABLET BY MOUTH TWICE A DAY 180 tablet 1   escitalopram (LEXAPRO) 20 MG tablet Take 40 mg by mouth daily.  3   furosemide (LASIX) 80 MG tablet Take 1 tablet (80 mg total) by mouth daily. T 90 tablet 3   indomethacin (INDOCIN) 50 MG capsule Take 1 capsule (50 mg total) by mouth 3 (three) times daily as needed (for gout flare ups ). Reported on 12/27/2015 90 capsule 1   insulin aspart (NOVOLOG) 100 UNIT/ML injection Inject 25-30 Units into the skin 3 (three) times daily as needed for high blood sugar.     JARDIANCE 25 MG TABS tablet Take 25 mg by mouth daily.     LEVEMIR 100 UNIT/ML injection Inject 40 Units into the skin at bedtime.  5   pentoxifylline (TRENTAL) 400 MG CR tablet TAKE 1 TABLET BY MOUTH 3 TIMES DAILY WITH MEALS. (Patient taking differently: Take 400 mg by mouth in the morning and at bedtime.) 270 tablet 1   potassium chloride SA (KLOR-CON M20) 20 MEQ tablet Take 1 tablet (20 mEq total) by mouth 2 (two) times daily. 180 tablet 1   simvastatin (ZOCOR) 20 MG tablet Take 20 mg by mouth every evening.  3   tamsulosin (FLOMAX) 0.4 MG CAPS capsule Take 1 capsule (0.4 mg total) by mouth daily after breakfast. (Patient taking differently: Take 0.4 mg by mouth 2 (two) times daily.) 30 capsule 0   traMADol (ULTRAM) 50 MG tablet 1 tab PO q6 hours prn pain 20 tablet 0   No current facility-administered medications for this visit.    Review of Systems  Constitutional:  Constitutional negative. HENT: HENT negative.  Eyes: Eyes negative.  Respiratory: Respiratory negative.  Cardiovascular: Positive for leg swelling.  GI: Gastrointestinal negative.  Skin: Positive for wound.  Neurological: Neurological negative. Hematologic: Hematologic/lymphatic negative.  Psychiatric: Psychiatric negative.        Objective:  Objective  Vitals:    06/25/22 1021  BP: (!) 95/55  Pulse: 68  Resp: 20  Temp: 97.9 F (36.6 C)  SpO2: 93%     Physical Exam HENT:     Head: Normocephalic.  Mouth/Throat:     Mouth: Mucous membranes are moist.  Eyes:     Pupils: Pupils are equal, round, and reactive to light.  Cardiovascular:     Rate and Rhythm: Normal rate.     Pulses:          Dorsalis pedis pulses are 2+ on the right side.       Posterior tibial pulses are 2+ on the right side and 2+ on the left side.  Pulmonary:     Effort: Pulmonary effort is normal.  Abdominal:     General: Abdomen is flat.  Musculoskeletal:     Comments: Plantar ulceration left foot base of second toe amputation site and there is an ulceration on the dorsum of the third toe with hammertoe deformity  Skin:    Capillary Refill: Capillary refill takes less than 2 seconds.  Neurological:     Mental Status: He is alert.  Psychiatric:        Mood and Affect: Mood normal.        Thought Content: Thought content normal.        Judgment: Judgment normal.     Data: ABI Findings:  +---------+------------------+-----+---------+--------+  Right   Rt Pressure (mmHg)IndexWaveform Comment   +---------+------------------+-----+---------+--------+  Brachial 141                                       +---------+------------------+-----+---------+--------+  PTA     182               1.29 triphasic          +---------+------------------+-----+---------+--------+  DP      160               1.13 triphasic          +---------+------------------+-----+---------+--------+  Great Toe98                0.70 Normal             +---------+------------------+-----+---------+--------+   +---------+------------------+-----+---------+---------+  Left    Lt Pressure (mmHg)IndexWaveform Comment    +---------+------------------+-----+---------+---------+  Brachial 135                                         +---------+------------------+-----+---------+---------+  PTA     230               1.63 triphasic           +---------+------------------+-----+---------+---------+  DP      159               1.13 biphasic            +---------+------------------+-----+---------+---------+  Great Toe                                amputated  +---------+------------------+-----+---------+---------+   +-------+-----------+-----------+------------+------------+  ABI/TBIToday's ABIToday's TBIPrevious ABIPrevious TBI  +-------+-----------+-----------+------------+------------+  Right 1.09       0.70                                 +-------+-----------+-----------+------------+------------+  Left  1.63       amputated                            +-------+-----------+-----------+------------+------------+  Summary:  Right: Resting right ankle-brachial index is within normal range. The  right toe-brachial index is normal.   Left: Resting left ankle-brachial index indicates noncompressible left  lower extremity arteries with triphasic and biphasic waveforms suggestive  of adequate perfusion..        Assessment/Plan:    76 year old male without history of arterial disease now with wounds on his left foot where he has had the first and second toe amputated.  Does not appear to have arterial disease and can see me on an as-needed basis.  We will keep wound care appointments with Dr. Sharol Given.    Waynetta Sandy MD [facility]

## 2022-07-01 ENCOUNTER — Ambulatory Visit (INDEPENDENT_AMBULATORY_CARE_PROVIDER_SITE_OTHER): Payer: Medicare Other | Admitting: Family

## 2022-07-01 ENCOUNTER — Encounter: Payer: Self-pay | Admitting: Family

## 2022-07-01 DIAGNOSIS — M86272 Subacute osteomyelitis, left ankle and foot: Secondary | ICD-10-CM | POA: Diagnosis not present

## 2022-07-01 NOTE — Progress Notes (Signed)
Office Visit Note   Patient: Oscar [name].           Date of Birth: 1946/02/20           MRN: [id number] Visit Date: 07/01/2022              Requested by: Josetta Huddle, MD [address] [place],  [place] [address] PCP: Kathalene Frames, MD  Chief Complaint  Patient presents with   Left Foot - Wound Check      HPI: The patient is a 76 year old gentleman seen in follow up for wagner grade 1 ulcer beneath second metatarsal head on left foot. Also has wound on second toe dorsum of IP joint.  Has been on doxycycline for the last 2 weeks for concern of infection in the left foot.    Has recently been to vascular surgery for ABIs: R 1.09 and L 1.63  He denies fever or chills but has had worsening of the ulcer over the dorsum of the second toe which is clogged  Assessment & Plan: Visit Diagnoses: No diagnosis found.  Plan: Discussed concern for further limb salvage surgery with the patient Dr. Sharol Given in agreement the plan will proceed with transmetatarsal amputation next week.  He will continue on his doxycycline.  Follow-Up Instructions: No follow-ups on file.   Ortho Exam  Patient is alert, oriented, no adenopathy, well-dressed, normal affect, normal respiratory effort. On examination of the left foot there is plantar ulcer beneath the second metatarsal head which is 2 cm in diameter and does probe to bone he also has dorsal ulcer to the IP joint of the second toe.  There is exposed bone in the wound bed there is mild surrounding erythema no ascending cellulitis no edema of the foot and ankle  Imaging: No results found. No images are attached to the encounter.  Labs: Lab Results  Component Value Date   HGBA1C 10.1 (H) 09/08/2016   HGBA1C 9.1 (H) 08/28/2014   HGBA1C 10.1 (H) 08/07/2013   ESRSEDRATE 75 (H) 08/28/2014   ESRSEDRATE 42 (H) 11/17/2012   CRP 12.9 (H) 08/28/2014   LABURIC 3.0 (L) 08/31/2014   REPTSTATUS 10/03/2017 FINAL 09/30/2017    GRAMSTAIN  09/30/2017    RARE WBC PRESENT, PREDOMINANTLY MONONUCLEAR NO ORGANISMS SEEN    CULT  09/30/2017    NO GROWTH 2 DAYS Performed at [facility], [place] [address], [place], [place] [address]      Lab Results  Component Value Date   ALBUMIN 3.3 (L) 09/07/2016   ALBUMIN 3.6 09/15/2014   ALBUMIN 3.1 (L) 08/29/2014    No results found for: "MG" No results found for: "VD25OH"  No results found for: "PREALBUMIN"    Latest Ref Rng & Units 12/26/2021    7:51 AM 01/15/2018   11:07 AM 09/30/2017   11:15 PM  CBC EXTENDED  WBC 4.0 - 10.5 K/uL 7.5  9.3  9.1   RBC 4.22 - 5.81 MIL/uL 4.07  4.22  4.04   Hemoglobin 13.0 - 17.0 g/dL 12.7  12.0  12.1   HCT 39.0 - 52.0 % 39.7  38.0  37.3   Platelets 150 - 400 K/uL 195  209  219   NEUT# 1.7 - 7.7 K/uL   6.6   Lymph# 0.7 - 4.0 K/uL   1.6      There is no height or weight on file to calculate BMI.  Orders:  No orders of the defined  types were placed in this encounter.  No orders of the defined types were placed in this encounter.    Procedures: No procedures performed  Clinical Data: No additional findings.  ROS:  All other systems negative, except as noted in the HPI. Review of Systems  Objective: Vital Signs: There were no vitals taken for this visit.  Specialty Comments:  No specialty comments available.  PMFS History: Patient Active Problem List   Diagnosis Date Noted   Pain in left foot 04/21/2018   PVD (peripheral vascular disease) ([place]) 04/21/2018   Amputated toe, left ([place]) 01/21/2018   Achilles tendon contracture, left 12/28/2017   Dehiscence of amputation stump ([place]) 11/10/2017   Great toe amputation status, left 11/10/2017   Memory change 04/14/2017   Cutaneous abscess of left foot 04/09/2017   Non-pressure chronic ulcer of right heel and midfoot limited to breakdown of skin ([place]) 10/07/2016   Influenza A    AKI (acute kidney injury) ([place]) 09/07/2016   Acute encephalopathy 09/07/2016   Acute  respiratory failure with hypoxia ([place]) 09/07/2016   Elevated lactic acid level 09/07/2016   Elevated troponin 09/07/2016   Coronary artery calcification seen on CT scan 10/28/2015   Acute on chronic diastolic (congestive) heart failure ([place]) 09/26/2015   OSA (obstructive sleep apnea) 02/07/2015   Sepsis ([place]) 08/28/2014   Chronic diastolic heart failure ([place]) 09/21/2013   CVA (cerebral infarction) 08/08/2013   Dizziness 08/07/2013   TIA (transient ischemic attack) 08/07/2013   Atrial fibrillation ([place]) 08/07/2013   Hypertension 08/07/2013   Anemia 08/07/2013   Dyspnea 06/21/2013   Chest pain 11/16/2012   Atrial fibrillation with RVR ([place]) 11/16/2012   Gout 11/16/2012   Diabetes mellitus type 2, uncontrolled, with complications ([place]) 96/29/5284   CKD (chronic kidney disease), stage II 11/16/2012   Atypical atrial flutter ([place]) 11/16/2012   Pleuritic chest pain 11/16/2012   Morbid obesity ([place]) 11/16/2012   Obstructive sleep apnea 11/16/2012   Past Medical History:  Diagnosis Date   Anal fissure    h/o - no recent complications   Arthritis    hips   Atrial fibrillation (HCC)    Depression    Diabetes mellitus without complication ([place])    Type II   Enlarged prostate    GERD (gastroesophageal reflux disease)    Gout    H/O hiatal hernia    Hard of hearing    History of kidney stones    Hx of typhoid fever    as a child   Hypertension    improved after diet/exercise   Memory change 04/14/2017   OSA (obstructive sleep apnea)    wears cpap   Pneumonia    PONV (postoperative nausea and vomiting)    also difficulty waking up   Stroke ([place]) 08/2013   short term memory loss (slight)    Family History  Problem Relation Age of Onset   COPD Father    Heart attack Sister        died age 23 of MI    Past Surgical History:  Procedure Laterality Date   AMPUTATION Left 10/02/2017   Procedure: LEFT FOOT 1ST RAY AMPUTATION;  Surgeon: Newt Minion, MD;  Location: [facility];   Service: Orthopedics;  Laterality: Left;   AMPUTATION Left 01/15/2018   Procedure: LEFT FOOT 2ND TOE AMPUTATION;  Surgeon: Newt Minion, MD;  Location: [facility];  Service: Orthopedics;  Laterality: Left;   CARPAL TUNNEL RELEASE Left 12/26/2021   Procedure: CARPAL TUNNEL RELEASE;  Surgeon:  Leanora Cover, MD;  Location: [facility];  Service: Orthopedics;  Laterality: Left;   carpel tunnel Bilateral    CATARACT EXTRACTION W/ INTRAOCULAR LENS  IMPLANT, BILATERAL     CHOLECYSTECTOMY     COLONOSCOPY     EYE SURGERY     laser surgery    I & D EXTREMITY Left 09/15/2014   Procedure: Excision Necrotic Left Achilles, Skin Graft, apply Wound VAC;  Surgeon: Newt Minion, MD;  Location: [facility];  Service: Orthopedics;  Laterality: Left;   KIDNEY STONE SURGERY     KNEE ARTHROSCOPY Bilateral    LIGAMENT REPAIR     TONSILLECTOMY     VASECTOMY     Social History   Occupational History   Occupation: Retired  Tobacco Use   Smoking status: Never   Smokeless tobacco: Never  Vaping Use   Vaping Use: Never used  Substance and Sexual Activity   Alcohol use: Yes    Alcohol/week: 0.0 standard drinks of alcohol    Comment: rare - 1-2x /yr   Drug use: No   Sexual activity: Not on file

## 2022-07-10 ENCOUNTER — Telehealth: Payer: Self-pay | Admitting: Orthopedic Surgery

## 2022-07-10 NOTE — Telephone Encounter (Signed)
SW Oscar [name], she hasn't heard anything else about scheduling her dad for surgery. His foot is looking worse. She thought they were going to be scheduled the Wednesday after Thanksgiving. I will check with Malachy Mood and ask her about his surgery date. He may need a refill of his abx as he has 5 days left. Will hold message.

## 2022-07-10 NOTE — Telephone Encounter (Signed)
Patient's daughter want someone to call ASAP due to change in scheduling for surgery.. [phone]

## 2022-07-11 NOTE — Telephone Encounter (Signed)
SW Oscar [name] she will get him on the schedule for Wednesday 07/16/22. Patient has enough abx to last him through first of next week. I will ask Dr. Sharol Given if appropriate to give another refill prior to surgery.

## 2022-07-11 NOTE — Telephone Encounter (Signed)
SW pt's dtr, she says that 07/16/22 will not work. The best date is on 07/23/22. Cheryl informed. She will get him scheduled and call dtr back with details.

## 2022-07-14 ENCOUNTER — Other Ambulatory Visit: Payer: Self-pay | Admitting: Family

## 2022-07-14 ENCOUNTER — Other Ambulatory Visit: Payer: Self-pay | Admitting: Orthopedic Surgery

## 2022-07-14 MED ORDER — DOXYCYCLINE HYCLATE 100 MG PO TABS: 100.0000 mg | ORAL_TABLET | Freq: Two times a day (BID) | ORAL | 0 refills | Status: DC

## 2022-07-14 NOTE — Telephone Encounter (Signed)
Pt is going to be scheduled on 07/23/22 for transmet amputation. Pt will be out of his doxycycline by tomorrow. Dtr is asking if he can get another refill prior to surgery? Foot is still not looking good.

## 2022-07-22 ENCOUNTER — Encounter (HOSPITAL_COMMUNITY): Payer: Self-pay | Admitting: Orthopedic Surgery

## 2022-07-22 ENCOUNTER — Telehealth: Payer: Self-pay | Admitting: Orthopedic Surgery

## 2022-07-22 ENCOUNTER — Other Ambulatory Visit: Payer: Self-pay

## 2022-07-22 NOTE — Progress Notes (Signed)
Patient's daughter, Katharine Look, states that nobody called them regarding Eliquis prior to surgery. This Probation officer instructed patient's daughter to call MD office and ask about Eliquis. This Probation officer sent a message to Dr. Sharol Given, via [facility].

## 2022-07-22 NOTE — Progress Notes (Signed)
Anesthesia Chart Review: SAME DAY WORK-UP   Case: [id number] Date/Time: 07/23/22 0951   Procedure: LEFT TRANSMETATARSAL AMPUTATION (Left)   Anesthesia type: Choice   Pre-op diagnosis: Osteomyelitis Left Foot   [facility]OM 04 / [facility]   Surgeons: Duda, Marcus V, MD       DISCUSSION: Patient is a 76-year-old male scheduled for the above procedure.  History includes never smoker, post-operative N/V, HTN, DM2, CKD, chronic diastolic CHF, afib (diagnosed 2014), CVA (08/2013), SDH (small left parafalcine SDH hematoma after mechanical fall, Eliquis held x 1 week per neurosurgery), memory disturbance (stage 02/12/22, on Aricept), GERD, hiatal hernia, gout, hard of hearing, BPH, cholecystectomy (08/16/09), nephrolithiasis (05/2010), left foot achilles abscess (s/p debridement, skin graft 09/15/14), osteomyelitis (left foot 1st ray amputation 10/02/17, left 2nd toe amputation 01/15/18), carpal tunnel syndrome (s/p left CTR 12/26/21).   History of chronic A-fib and chronic diastolic CHF dating back about 10 years. First diagnosed with aflutter with RVR in April 2014 and followed by Dr. Taylor for chronic afib. In 2017, he was referred to Dr. Bensimhon for diastolic CHF. Echo 2016 showed EF 55-60%, normal wall motion, no significant valvular abnormalities. Cardiopulmonary exercise test 2017 showed severe functional limitation primarily due to severe obesity and related restrictive lung physiology. Last visit with Dr. Bensimhon was on 05/25/20. Volume status stable on Lasix and as needed metolazone, Weight loss recommended. On Eliquis for afib. Dr. Bensimhon gave permission to hold temporarily for patient' carpal tunnel surgery in May.  Cardiac medications include Cardizem 360 mg daily, Eliquis 5 mg twice daily, Lasix 80 mg daily, Zocor 20 mg daily.  Dr. Duda said [name] did not have to hold Eliquis for this procedure.   PCP records from Henderson, Jonathan A, MD at [facility] [place]  requested, but are still pending. Per Anesthesia APP note in May 2023, A1c was 6.6% and Creatinine was 1.79 on 12/18/21. (His Creatinine was 2.35 on 12/26/21).   Of note, daughter reported he had some cold symptoms several days ago without fever. He did not require medical evaluation and self medication with OTC medications. I attempted to call him, but only got a voice message. His daughter was able to contact him and reported that he said he felt well and that congestion had resolved. Coughing continues to improve. He is a same day work-up, so anesthesia team to evaluate on the day of surgery. He has osteomyelitis and a left TMA has been recommended.    VS:  BP Readings from Last 3 Encounters:  06/25/22 (!) 95/55  02/12/22 (!) 119/56  12/26/21 111/70   Pulse Readings from Last 3 Encounters:  06/25/22 68  02/12/22 60  12/26/21 61     PROVIDERS: Henderson, Jonathan A, MD is PCP ([facility]) Slack, Sarah, NP is neurology provider. Last visit 02/12/22.  Taylor, Gregg, MD is EP cardiologist. Last visit 09/05/15. Bensimhon, Daniel, MD is HF cardiologist. Last visit 05/25/20.    LABS: See DISCUSSION. Preoperative labs on the day of surgery as indicated.   IMAGES: CT Head 11/25/21 (post mechanical fall, Eliquis held x 1 week): IMPRESSION: Small left parafalcine subdural hematoma is noted. Critical Value/emergent results were called by telephone at the time of interpretation on 11/25/2021 at 2:33 pm to provider Abigail Harris, PA, who verbally acknowledged these results.   EKG: 12/26/21: Atrial fibrillation with premature ventricular or aberrantly conducted complexes. Ventricular rate 78 bpm. Left axis deviation Low voltage QRS Possible Inferior infarct , age undetermined Cannot rule   out Anteroseptal infarct , age undetermined Abnormal ECG When compared with ECG of 25-May-2020 11:17, PREVIOUS ECG IS PRESENT since the last tracing the PVC is new Confirmed by Lauree Chandler [phone])  on 12/26/2021 9:28:11 AM   CV: Cardiopulmonary exercise test 09/20/2015: Conclusion: Exercise testing with gas exchange demonstrates a  severe functional impairment when compared to matched sedentary  norms. The limitation appears to be primarily due to the patients  severe obesity and related restrictive lung physiology. However,  the blunted BP response to exercise and low O2 pulse suggest a  concomitant circulatory limitation which is likely related to his  diastolic HF and atrial fibrillation.   TTE 06/12/2015: - Left ventricle: The cavity size was normal. Systolic function was    normal. The estimated ejection fraction was in the range of 55%    to 60%. Wall motion was normal; there were no regional wall    motion abnormalities.  - Aortic valve: There was trivial regurgitation.  - Mitral valve: Calcified annulus. There was mild regurgitation.  - Left atrium: The atrium was moderately dilated.  - Right atrium: The atrium was mildly dilated.  - Atrial septum: No defect or patent foramen ovale was identified.  - Pulmonary arteries: PA peak pressure: 37 mm Hg (S).   Carotid duplex 08/07/13:  - The vertebral arteries appear patent with antegrade flow. - Findings consistent with 1-39 percent stenosis involving the B ICA.     Past Medical History:  Diagnosis Date   Anal fissure    h/o - no recent complications   Arthritis    hips   Atrial fibrillation (HCC)    CKD (chronic kidney disease)    Depression    Diabetes mellitus without complication (HCC)    Type II   Enlarged prostate    GERD (gastroesophageal reflux disease)    Gout    H/O hiatal hernia    Hard of hearing    History of kidney stones    Hx of typhoid fever    as a child   Hypertension    improved after diet/exercise   Memory change 04/14/2017   OSA (obstructive sleep apnea)    wears cpap   Pneumonia    PONV (postoperative nausea and vomiting)    also difficulty waking up   Stroke ([place]) 08/2013   short  term memory loss (slight)    Past Surgical History:  Procedure Laterality Date   AMPUTATION Left 10/02/2017   Procedure: LEFT FOOT 1ST RAY AMPUTATION;  Surgeon: Newt Minion, MD;  Location: [facility];  Service: Orthopedics;  Laterality: Left;   AMPUTATION Left 01/15/2018   Procedure: LEFT FOOT 2ND TOE AMPUTATION;  Surgeon: Newt Minion, MD;  Location: [facility];  Service: Orthopedics;  Laterality: Left;   CARPAL TUNNEL RELEASE Left 12/26/2021   Procedure: CARPAL TUNNEL RELEASE;  Surgeon: Leanora Cover, MD;  Location: [facility];  Service: Orthopedics;  Laterality: Left;   carpel tunnel Bilateral    CATARACT EXTRACTION W/ INTRAOCULAR LENS  IMPLANT, BILATERAL     CHOLECYSTECTOMY     COLONOSCOPY     EYE SURGERY     laser surgery    I & D EXTREMITY Left 09/15/2014   Procedure: Excision Necrotic Left Achilles, Skin Graft, apply Wound VAC;  Surgeon: Newt Minion, MD;  Location: [facility];  Service: Orthopedics;  Laterality: Left;   KIDNEY STONE SURGERY     KNEE ARTHROSCOPY Bilateral    LIGAMENT REPAIR     TONSILLECTOMY  VASECTOMY      MEDICATIONS: No current facility-administered medications for this encounter.    acetaminophen (TYLENOL) 500 MG tablet   allopurinol (ZYLOPRIM) 300 MG tablet   buPROPion (WELLBUTRIN SR) 150 MG 12 hr tablet   Cholecalciferol (VITAMIN D) 50 MCG (2000 UT) tablet   diltiazem (CARDIZEM CD) 360 MG 24 hr capsule   donepezil (ARICEPT) 10 MG tablet   doxycycline (VIBRA-TABS) 100 MG tablet   ELIQUIS 5 MG TABS tablet   escitalopram (LEXAPRO) 20 MG tablet   furosemide (LASIX) 80 MG tablet   indomethacin (INDOCIN) 50 MG capsule   insulin aspart (NOVOLOG) 100 UNIT/ML injection   JARDIANCE 25 MG TABS tablet   LEVEMIR 100 UNIT/ML injection   oxymetazoline (AFRIN) 0.05 % nasal spray   pentoxifylline (TRENTAL) 400 MG CR tablet   potassium chloride SA (KLOR-CON M20) 20 MEQ tablet   simvastatin (ZOCOR) 20 MG tablet   tamsulosin (FLOMAX) 0.4 MG CAPS capsule   B-D INS SYRINGE  0.5CC/30GX1/2" 30G X 1/2" 0.5 ML MISC   blood glucose meter kit and supplies KIT     , PA-C Surgical Short Stay/Anesthesiology [facility] Phone [phone] [facility] Phone [phone] 07/22/2022 12:51 PM        

## 2022-07-22 NOTE — Telephone Encounter (Signed)
Erin called and sw pt about medication today.

## 2022-07-22 NOTE — Telephone Encounter (Signed)
Patient daughter has questions about surgery for her father  tomorrow that she just found out about yesterday from the Pharmacy and stated that know one called her from Dr Sharol Given office. Best contact [phone]

## 2022-07-22 NOTE — Anesthesia Preprocedure Evaluation (Signed)
Anesthesia Evaluation  Patient identified by MRN, date of birth, ID band Patient awake    Reviewed: Allergy & Precautions, NPO status , Patient's Chart, lab work & pertinent test results  History of Anesthesia Complications Negative for: history of anesthetic complications  Airway Mallampati: II  TM Distance: >3 FB Neck ROM: Full    Dental  (+) Dental Advisory Given   Pulmonary neg pulmonary ROS   Pulmonary exam normal        Cardiovascular hypertension, + Peripheral Vascular Disease and +CHF  Normal cardiovascular exam+ dysrhythmias Atrial Fibrillation   CV: Cardiopulmonary exercise test 09/20/2015: Conclusion: Exercise testing with gas exchange demonstrates a  severe functional impairment when compared to matched sedentary  norms. The limitation appears to be primarily due to the patients  severe obesity and related restrictive lung physiology. However,  the blunted BP response to exercise and low O2 pulse suggest a  concomitant circulatory limitation which is likely related to his  diastolic HF and atrial fibrillation.   TTE 06/12/2015: - Left ventricle: The cavity size was normal. Systolic function was    normal. The estimated ejection fraction was in the range of 55%    to 60%. Wall motion was normal; there were no regional wall    motion abnormalities.  - Aortic valve: There was trivial regurgitation.  - Mitral valve: Calcified annulus. There was mild regurgitation.  - Left atrium: The atrium was moderately dilated.  - Right atrium: The atrium was mildly dilated.  - Atrial septum: No defect or patent foramen ovale was identified.  - Pulmonary arteries: PA peak pressure: 37 mm Hg (S).    Neuro/Psych Carotid duplex 08/07/13:  - The vertebral arteries appear patent with antegrade flow. - Findings consistent with 1-39 percent stenosis involving the B ICA. CVA (2015)    GI/Hepatic Neg liver ROS, hiatal hernia,GERD  ,,   Endo/Other  diabetes, Type 2  Morbid obesity  Renal/GU Renal Insufficiency and CRFRenal disease  negative genitourinary   Musculoskeletal negative musculoskeletal ROS (+)    Abdominal   Peds  Hematology negative hematology ROS (+)   Anesthesia Other Findings Left foot osteomyelitis  Reproductive/Obstetrics                             Anesthesia Physical Anesthesia Plan  ASA: 3  Anesthesia Plan: General   Post-op Pain Management: Tylenol PO (pre-op)*   Induction: Intravenous  PONV Risk Score and Plan: 2 and Ondansetron, Dexamethasone, Midazolam and Treatment may vary due to age or medical condition  Airway Management Planned: LMA  Additional Equipment: None  Intra-op Plan:   Post-operative Plan: Extubation in OR  Informed Consent: I have reviewed the patients History and Physical, chart, labs and discussed the procedure including the risks, benefits and alternatives for the proposed anesthesia with the patient or authorized representative who has indicated his/her understanding and acceptance.     Dental advisory given  Plan Discussed with:   Anesthesia Plan Comments: (PAT note written 07/22/2022 by Myra Gianotti, PA-C.  )       Anesthesia Quick Evaluation

## 2022-07-22 NOTE — Progress Notes (Addendum)
This Probation officer called Mrs. Katharine Look, patient's daughter with questions and instructions for the surgery day, patient being hard of hearing. Per daughter, patient had in the last few days symptoms of cold, cough, congestion, not fewer. Patient didn't see his PCP and he took OTC medicine for these symptoms. This morning the daughter is not sure how her father is feeling because she is still at her house. Mrs. Katharine Look was instructed to check with her father and if he still has cold symptoms to call patient's PCP and to call back short stay. Mrs. Katharine Look verbalized understanding.    PCP - Josetta Huddle, MD Cardiologist - Glori Bickers, MD  PPM/ICD - denies Device Orders - n/a Rep Notified - n/a  Chest x-ray - n/a EKG - 12/26/21 Stress Test - 09/20/15 ECHO - 06/12/15 Cardiac Cath - denies  CPAP - yes  Fasting Blood Sugar - 140-200 Checks Blood Sugar 5-6 times/day  Blood Thinner Instructions: Eliquis - per Dr. Sharol Given, Patient will continue Eliquis Aspirin Instructions: Patient was instructed: As of today, STOP taking any Aspirin (unless otherwise instructed by your surgeon) Aleve, Naproxen, Ibuprofen, Motrin, Advil, Goody's, BC's, all herbal medications, fish oil, and all vitamins.  ERAS Protcol - yes, until 07:00 o'clock  COVID TEST- n/a  Anesthesia review: yes - cardiac history  Patient verbally denies any shortness of breath, fever, cough and chest pain during phone call   -------------  SDW INSTRUCTIONS given:  Your procedure is scheduled on Wednesday, December 13th, 2023.  Report to [facility] Main Entrance "A" at 07:30 A.M., and check in at the Admitting office.  Call this number if you have problems the morning of surgery:  [phone]   Remember:  Do not eat after midnight the night before your surgery  You may drink clear liquids until 07:00 the morning of your surgery.   Clear liquids allowed are: Water, Non-Citrus Juices (without pulp), Carbonated Beverages, Clear Tea,  Black Coffee Only, and Gatorade    Take these medicines the morning of surgery with A SIP OF WATER - Allopurinol, Bupropion, Cardizem, Doxycycline, Lexapro, Flomax, Trental. PRN: Tylenol  THE NIGHT BEFORE SURGERY, take 50% of your regular dose of Levemir.   Hold Jardiance 72hrs prior to surgery Hold Novolog the day of surgery    If your CBG is greater than 220 mg/dL, you may take  of your sliding scale (correction) dose of insulin.   How do I manage my blood sugar before surgery? Check your blood sugar at least 4 times a day, starting 2 days before surgery, to make sure that the level is not too high or low.  Check your blood sugar the morning of your surgery when you wake up and every 2 hours until you get to the Short Stay unit.  If your blood sugar is less than 70 mg/dL, you will need to treat for low blood sugar: Do not take insulin. Treat a low blood sugar (less than 70 mg/dL) with  cup of clear juice (cranberry or apple), 4 glucose tablets, OR glucose gel. Recheck blood sugar in 15 minutes after treatment (to make sure it is greater than 70 mg/dL). If your blood sugar is not greater than 70 mg/dL on recheck, call [phone] for further instructions. Report your blood sugar to the short stay nurse when you get to Short Stay.   The day of surgery:                     Do not wear jewelry,  Do not wear lotions, powders, colognes, or deodorant.            Men may shave face and neck.            Do not bring valuables to the hospital.            [facility] is not responsible for any belongings or valuables.  Do NOT Smoke (Tobacco/Vaping) 24 hours prior to your procedure If you use a CPAP at night, you may bring all equipment for your overnight stay.   Contacts, glasses, dentures or bridgework may not be worn into surgery.      For patients admitted to the hospital, discharge time will be determined by your treatment team.   Patients discharged the day of surgery  will not be allowed to drive home, and someone needs to stay with them for 24 hours.    Special instructions:   [place]- Preparing For Surgery  Before surgery, you can play an important role. Because skin is not sterile, your skin needs to be as free of germs as possible. You can reduce the number of germs on your skin by washing with CHG (chlorahexidine gluconate) Soap before surgery.  CHG is an antiseptic cleaner which kills germs and bonds with the skin to continue killing germs even after washing.    Oral Hygiene is also important to reduce your risk of infection.  Remember - BRUSH YOUR TEETH THE MORNING OF SURGERY WITH YOUR REGULAR TOOTHPASTE  Please do not use if you have an allergy to CHG or antibacterial soaps. If your skin becomes reddened/irritated stop using the CHG.  Do not shave (including legs and underarms) for at least 48 hours prior to first CHG shower. It is OK to shave your face.  Please follow these instructions carefully.   Shower the NIGHT BEFORE SURGERY and the MORNING OF SURGERY with DIAL Soap.   Pat yourself dry with a CLEAN TOWEL.  Wear CLEAN PAJAMAS to bed the night before surgery  Place CLEAN SHEETS on your bed the night of your first shower and DO NOT SLEEP WITH PETS.   Day of Surgery: Please shower morning of surgery  Wear Clean/Comfortable clothing the morning of surgery Do not apply any deodorants/lotions.   Remember to brush your teeth WITH YOUR REGULAR TOOTHPASTE.   Questions were answered. Patient verbalized understanding of instructions.

## 2022-07-23 ENCOUNTER — Other Ambulatory Visit: Payer: Self-pay

## 2022-07-23 ENCOUNTER — Encounter (HOSPITAL_COMMUNITY)
Admission: RE | Disposition: A | Discharge status: Home / Self Care | Payer: Self-pay | Source: Home / Self Care | Attending: Orthopedic Surgery

## 2022-07-23 ENCOUNTER — Inpatient Hospital Stay (HOSPITAL_COMMUNITY)
Admission: RE | Admit: 2022-07-23 | Discharge: 2022-07-29 | DRG: 617 | Disposition: A | Discharge status: Rehabilitation Facility | Payer: Medicare Other | Attending: Orthopedic Surgery | Admitting: Orthopedic Surgery

## 2022-07-23 ENCOUNTER — Ambulatory Visit (HOSPITAL_COMMUNITY): Payer: Medicare Other | Admitting: Vascular Surgery

## 2022-07-23 ENCOUNTER — Ambulatory Visit (HOSPITAL_BASED_OUTPATIENT_CLINIC_OR_DEPARTMENT_OTHER): Payer: Medicare Other | Admitting: Vascular Surgery

## 2022-07-23 ENCOUNTER — Encounter (HOSPITAL_COMMUNITY): Payer: Self-pay | Admitting: Orthopedic Surgery

## 2022-07-23 DIAGNOSIS — Z91041 Radiographic dye allergy status: Secondary | ICD-10-CM

## 2022-07-23 DIAGNOSIS — M869 Osteomyelitis, unspecified: Secondary | ICD-10-CM | POA: Diagnosis not present

## 2022-07-23 DIAGNOSIS — E785 Hyperlipidemia, unspecified: Secondary | ICD-10-CM | POA: Diagnosis present

## 2022-07-23 DIAGNOSIS — Z89432 Acquired absence of left foot: Secondary | ICD-10-CM | POA: Diagnosis not present

## 2022-07-23 DIAGNOSIS — F0390 Unspecified dementia without behavioral disturbance: Secondary | ICD-10-CM | POA: Diagnosis present

## 2022-07-23 DIAGNOSIS — I69311 Memory deficit following cerebral infarction: Secondary | ICD-10-CM | POA: Diagnosis not present

## 2022-07-23 DIAGNOSIS — N400 Enlarged prostate without lower urinary tract symptoms: Secondary | ICD-10-CM | POA: Diagnosis present

## 2022-07-23 DIAGNOSIS — L02612 Cutaneous abscess of left foot: Secondary | ICD-10-CM

## 2022-07-23 DIAGNOSIS — F03918 Unspecified dementia, unspecified severity, with other behavioral disturbance: Secondary | ICD-10-CM | POA: Diagnosis present

## 2022-07-23 DIAGNOSIS — Z7901 Long term (current) use of anticoagulants: Secondary | ICD-10-CM

## 2022-07-23 DIAGNOSIS — R413 Other amnesia: Secondary | ICD-10-CM | POA: Diagnosis present

## 2022-07-23 DIAGNOSIS — Z79899 Other long term (current) drug therapy: Secondary | ICD-10-CM | POA: Diagnosis not present

## 2022-07-23 DIAGNOSIS — I130 Hypertensive heart and chronic kidney disease with heart failure and stage 1 through stage 4 chronic kidney disease, or unspecified chronic kidney disease: Secondary | ICD-10-CM | POA: Diagnosis not present

## 2022-07-23 DIAGNOSIS — T8781 Dehiscence of amputation stump: Secondary | ICD-10-CM

## 2022-07-23 DIAGNOSIS — E1169 Type 2 diabetes mellitus with other specified complication: Secondary | ICD-10-CM | POA: Diagnosis not present

## 2022-07-23 DIAGNOSIS — K219 Gastro-esophageal reflux disease without esophagitis: Secondary | ICD-10-CM | POA: Diagnosis present

## 2022-07-23 DIAGNOSIS — Z89412 Acquired absence of left great toe: Secondary | ICD-10-CM | POA: Diagnosis not present

## 2022-07-23 DIAGNOSIS — F32A Depression, unspecified: Secondary | ICD-10-CM | POA: Diagnosis present

## 2022-07-23 DIAGNOSIS — Z794 Long term (current) use of insulin: Secondary | ICD-10-CM

## 2022-07-23 DIAGNOSIS — I5032 Chronic diastolic (congestive) heart failure: Secondary | ICD-10-CM | POA: Diagnosis present

## 2022-07-23 DIAGNOSIS — M868X7 Other osteomyelitis, ankle and foot: Secondary | ICD-10-CM | POA: Diagnosis present

## 2022-07-23 DIAGNOSIS — N401 Enlarged prostate with lower urinary tract symptoms: Secondary | ICD-10-CM | POA: Diagnosis present

## 2022-07-23 DIAGNOSIS — D6489 Other specified anemias: Secondary | ICD-10-CM | POA: Diagnosis present

## 2022-07-23 DIAGNOSIS — Z7984 Long term (current) use of oral hypoglycemic drugs: Secondary | ICD-10-CM

## 2022-07-23 DIAGNOSIS — E1122 Type 2 diabetes mellitus with diabetic chronic kidney disease: Secondary | ICD-10-CM

## 2022-07-23 DIAGNOSIS — Z8673 Personal history of transient ischemic attack (TIA), and cerebral infarction without residual deficits: Secondary | ICD-10-CM

## 2022-07-23 DIAGNOSIS — I509 Heart failure, unspecified: Secondary | ICD-10-CM

## 2022-07-23 DIAGNOSIS — I739 Peripheral vascular disease, unspecified: Principal | ICD-10-CM

## 2022-07-23 DIAGNOSIS — R3911 Hesitancy of micturition: Secondary | ICD-10-CM | POA: Diagnosis not present

## 2022-07-23 DIAGNOSIS — M160 Bilateral primary osteoarthritis of hip: Secondary | ICD-10-CM | POA: Diagnosis present

## 2022-07-23 DIAGNOSIS — E876 Hypokalemia: Secondary | ICD-10-CM | POA: Diagnosis not present

## 2022-07-23 DIAGNOSIS — Z882 Allergy status to sulfonamides status: Secondary | ICD-10-CM

## 2022-07-23 DIAGNOSIS — E669 Obesity, unspecified: Secondary | ICD-10-CM | POA: Diagnosis present

## 2022-07-23 DIAGNOSIS — Z89422 Acquired absence of other left toe(s): Secondary | ICD-10-CM

## 2022-07-23 DIAGNOSIS — Z825 Family history of asthma and other chronic lower respiratory diseases: Secondary | ICD-10-CM

## 2022-07-23 DIAGNOSIS — G4733 Obstructive sleep apnea (adult) (pediatric): Secondary | ICD-10-CM | POA: Diagnosis present

## 2022-07-23 DIAGNOSIS — F0393 Unspecified dementia, unspecified severity, with mood disturbance: Secondary | ICD-10-CM | POA: Diagnosis present

## 2022-07-23 DIAGNOSIS — I10 Essential (primary) hypertension: Secondary | ICD-10-CM | POA: Diagnosis not present

## 2022-07-23 DIAGNOSIS — Z6839 Body mass index (BMI) 39.0-39.9, adult: Secondary | ICD-10-CM

## 2022-07-23 DIAGNOSIS — I4891 Unspecified atrial fibrillation: Secondary | ICD-10-CM | POA: Diagnosis present

## 2022-07-23 DIAGNOSIS — E11621 Type 2 diabetes mellitus with foot ulcer: Secondary | ICD-10-CM | POA: Diagnosis present

## 2022-07-23 DIAGNOSIS — N189 Chronic kidney disease, unspecified: Secondary | ICD-10-CM

## 2022-07-23 DIAGNOSIS — I110 Hypertensive heart disease with heart failure: Secondary | ICD-10-CM | POA: Diagnosis present

## 2022-07-23 DIAGNOSIS — Z8249 Family history of ischemic heart disease and other diseases of the circulatory system: Secondary | ICD-10-CM

## 2022-07-23 DIAGNOSIS — L97529 Non-pressure chronic ulcer of other part of left foot with unspecified severity: Secondary | ICD-10-CM | POA: Diagnosis present

## 2022-07-23 DIAGNOSIS — M109 Gout, unspecified: Secondary | ICD-10-CM | POA: Diagnosis present

## 2022-07-23 DIAGNOSIS — Z4781 Encounter for orthopedic aftercare following surgical amputation: Secondary | ICD-10-CM | POA: Diagnosis present

## 2022-07-23 DIAGNOSIS — L089 Local infection of the skin and subcutaneous tissue, unspecified: Secondary | ICD-10-CM | POA: Diagnosis not present

## 2022-07-23 DIAGNOSIS — N1831 Chronic kidney disease, stage 3a: Secondary | ICD-10-CM | POA: Diagnosis present

## 2022-07-23 DIAGNOSIS — L929 Granulomatous disorder of the skin and subcutaneous tissue, unspecified: Secondary | ICD-10-CM | POA: Diagnosis not present

## 2022-07-23 DIAGNOSIS — Z9049 Acquired absence of other specified parts of digestive tract: Secondary | ICD-10-CM | POA: Diagnosis not present

## 2022-07-23 DIAGNOSIS — M86172 Other acute osteomyelitis, left ankle and foot: Secondary | ICD-10-CM | POA: Diagnosis not present

## 2022-07-23 DIAGNOSIS — E1151 Type 2 diabetes mellitus with diabetic peripheral angiopathy without gangrene: Secondary | ICD-10-CM | POA: Diagnosis not present

## 2022-07-23 HISTORY — PX: AMPUTATION: SHX166

## 2022-07-23 HISTORY — DX: Chronic kidney disease, unspecified: N18.9

## 2022-07-23 LAB — BASIC METABOLIC PANEL
Anion gap: 13 (ref 5–15)
BUN: 50 mg/dL — ABNORMAL HIGH (ref 8–23)
CO2: 23 mmol/L (ref 22–32)
Calcium: 8.9 mg/dL (ref 8.9–10.3)
Chloride: 100 mmol/L (ref 98–111)
Creatinine, Ser: 2.14 mg/dL — ABNORMAL HIGH (ref 0.61–1.24)
GFR, Estimated: 31 mL/min — ABNORMAL LOW (ref >60)
Glucose, Bld: 209 mg/dL — ABNORMAL HIGH (ref 70–99)
Potassium: 3.5 mmol/L (ref 3.5–5.1)
Sodium: 136 mmol/L (ref 135–145)

## 2022-07-23 LAB — CBC
HCT: 40 % (ref 39.0–52.0)
Hemoglobin: 13.2 g/dL (ref 13.0–17.0)
MCH: 31.3 pg (ref 26.0–34.0)
MCHC: 33 g/dL (ref 30.0–36.0)
MCV: 94.8 fL (ref 80.0–100.0)
Platelets: 204 10*3/uL (ref 150–400)
RBC: 4.22 MIL/uL (ref 4.22–5.81)
RDW: 15.3 % (ref 11.5–15.5)
WBC: 7.3 10*3/uL (ref 4.0–10.5)
nRBC: 0 % (ref 0.0–0.2)

## 2022-07-23 LAB — GLUCOSE, CAPILLARY
Glucose-Capillary: 200 mg/dL — ABNORMAL HIGH (ref 70–99)
Glucose-Capillary: 201 mg/dL — ABNORMAL HIGH (ref 70–99)
Glucose-Capillary: 202 mg/dL — ABNORMAL HIGH (ref 70–99)
Glucose-Capillary: 209 mg/dL — ABNORMAL HIGH (ref 70–99)
Glucose-Capillary: 219 mg/dL — ABNORMAL HIGH (ref 70–99)
Glucose-Capillary: 211 mg/dL — ABNORMAL HIGH (ref 70–99)
Glucose-Capillary: 265 mg/dL — ABNORMAL HIGH (ref 70–99)

## 2022-07-23 SURGERY — AMPUTATION, FOOT, RAY
Anesthesia: General | Site: Foot | Laterality: Left

## 2022-07-23 MED ORDER — SODIUM CHLORIDE 0.9 % IV SOLN: INTRAVENOUS | Status: DC

## 2022-07-23 MED ORDER — OXYCODONE HCL 5 MG PO TABS: 5.0000 mg | ORAL_TABLET | Freq: Once | ORAL | Status: DC | PRN

## 2022-07-23 MED ORDER — OXYCODONE HCL 5 MG PO TABS
5.0000 mg | ORAL_TABLET | ORAL | Status: DC | PRN
  Administered 2022-07-24: 10 mg via ORAL
  Administered 2022-07-26 (×2): 5 mg via ORAL
  Administered 2022-07-27 (×2): 10 mg via ORAL
  Administered 2022-07-28 – 2022-07-29 (×3): 5 mg via ORAL
  Filled 2022-07-23 (×5): qty 1
  Filled 2022-07-23: qty 2
  Filled 2022-07-23: qty 1
  Filled 2022-07-23 (×4): qty 2

## 2022-07-23 MED ORDER — ORAL CARE MOUTH RINSE: 15.0000 mL | Freq: Once | OROMUCOSAL | Status: AC

## 2022-07-23 MED ORDER — METHOCARBAMOL 1000 MG/10ML IJ SOLN: 500.0000 mg | Freq: Four times a day (QID) | INTRAVENOUS | Status: DC | PRN

## 2022-07-23 MED ORDER — POTASSIUM CHLORIDE CRYS ER 20 MEQ PO TBCR
20.0000 meq | EXTENDED_RELEASE_TABLET | Freq: Two times a day (BID) | ORAL | Status: DC
  Administered 2022-07-23 – 2022-07-29 (×12): 20 meq via ORAL
  Filled 2022-07-23 (×12): qty 1

## 2022-07-23 MED ORDER — METOCLOPRAMIDE HCL 5 MG/ML IJ SOLN: 5.0000 mg | Freq: Three times a day (TID) | INTRAMUSCULAR | Status: DC | PRN

## 2022-07-23 MED ORDER — ONDANSETRON HCL 4 MG/2ML IJ SOLN: 4.0000 mg | Freq: Once | INTRAMUSCULAR | Status: DC | PRN

## 2022-07-23 MED ORDER — DONEPEZIL HCL 10 MG PO TABS
10.0000 mg | ORAL_TABLET | Freq: Every evening | ORAL | Status: DC
  Administered 2022-07-23 – 2022-07-28 (×6): 10 mg via ORAL
  Filled 2022-07-23 (×6): qty 1

## 2022-07-23 MED ORDER — LIDOCAINE 2% (20 MG/ML) 5 ML SYRINGE
INTRAMUSCULAR | Status: DC | PRN
  Administered 2022-07-23: 100 mg via INTRAVENOUS

## 2022-07-23 MED ORDER — 0.9 % SODIUM CHLORIDE (POUR BTL) OPTIME
TOPICAL | Status: DC | PRN
  Administered 2022-07-23: 1000 mL

## 2022-07-23 MED ORDER — ACETAMINOPHEN 325 MG PO TABS
325.0000 mg | ORAL_TABLET | Freq: Four times a day (QID) | ORAL | Status: DC | PRN
  Administered 2022-07-24 – 2022-07-28 (×6): 650 mg via ORAL
  Filled 2022-07-23 (×7): qty 2

## 2022-07-23 MED ORDER — LACTATED RINGERS IV SOLN: INTRAVENOUS | Status: DC

## 2022-07-23 MED ORDER — FENTANYL CITRATE (PF) 250 MCG/5ML IJ SOLN
INTRAMUSCULAR | Status: DC | PRN
  Administered 2022-07-23: 50 ug via INTRAVENOUS

## 2022-07-23 MED ORDER — ESCITALOPRAM OXALATE 10 MG PO TABS
40.0000 mg | ORAL_TABLET | ORAL | Status: DC
  Administered 2022-07-24 – 2022-07-29 (×6): 40 mg via ORAL
  Filled 2022-07-23 (×6): qty 4

## 2022-07-23 MED ORDER — TAMSULOSIN HCL 0.4 MG PO CAPS
0.4000 mg | ORAL_CAPSULE | Freq: Two times a day (BID) | ORAL | Status: DC
  Administered 2022-07-23 – 2022-07-29 (×12): 0.4 mg via ORAL
  Filled 2022-07-23 (×12): qty 1

## 2022-07-23 MED ORDER — FUROSEMIDE 40 MG PO TABS
80.0000 mg | ORAL_TABLET | Freq: Every day | ORAL | Status: DC
  Administered 2022-07-24 – 2022-07-29 (×6): 80 mg via ORAL
  Filled 2022-07-23 (×6): qty 2

## 2022-07-23 MED ORDER — CHLORHEXIDINE GLUCONATE 0.12 % MT SOLN
15.0000 mL | Freq: Once | OROMUCOSAL | Status: AC
  Administered 2022-07-23: 15 mL via OROMUCOSAL
  Filled 2022-07-23: qty 15

## 2022-07-23 MED ORDER — INSULIN ASPART 100 UNIT/ML IJ SOLN
0.0000 [IU] | Freq: Three times a day (TID) | INTRAMUSCULAR | Status: DC
  Administered 2022-07-23 (×2): 5 [IU] via SUBCUTANEOUS
  Administered 2022-07-24 – 2022-07-25 (×4): 3 [IU] via SUBCUTANEOUS
  Administered 2022-07-25: 5 [IU] via SUBCUTANEOUS
  Administered 2022-07-25: 3 [IU] via SUBCUTANEOUS
  Administered 2022-07-26: 5 [IU] via SUBCUTANEOUS
  Administered 2022-07-26 (×2): 3 [IU] via SUBCUTANEOUS
  Administered 2022-07-27: 2 [IU] via SUBCUTANEOUS
  Administered 2022-07-27: 5 [IU] via SUBCUTANEOUS
  Administered 2022-07-27 – 2022-07-29 (×6): 3 [IU] via SUBCUTANEOUS

## 2022-07-23 MED ORDER — POLYETHYLENE GLYCOL 3350 17 G PO PACK: 17.0000 g | PACK | Freq: Every day | ORAL | Status: DC | PRN

## 2022-07-23 MED ORDER — FENTANYL CITRATE (PF) 100 MCG/2ML IJ SOLN
INTRAMUSCULAR | Status: AC
  Filled 2022-07-23: qty 2

## 2022-07-23 MED ORDER — OXYCODONE HCL 5 MG PO TABS
ORAL_TABLET | ORAL | Status: AC
  Filled 2022-07-23: qty 2

## 2022-07-23 MED ORDER — FENTANYL CITRATE (PF) 100 MCG/2ML IJ SOLN
25.0000 ug | INTRAMUSCULAR | Status: DC | PRN
  Administered 2022-07-23 (×2): 25 ug via INTRAVENOUS

## 2022-07-23 MED ORDER — INSULIN ASPART 100 UNIT/ML IJ SOLN
4.0000 [IU] | Freq: Three times a day (TID) | INTRAMUSCULAR | Status: DC
  Administered 2022-07-23 – 2022-07-29 (×15): 4 [IU] via SUBCUTANEOUS

## 2022-07-23 MED ORDER — DOCUSATE SODIUM 100 MG PO CAPS
100.0000 mg | ORAL_CAPSULE | Freq: Two times a day (BID) | ORAL | Status: DC
  Administered 2022-07-23 – 2022-07-29 (×12): 100 mg via ORAL
  Filled 2022-07-23 (×12): qty 1

## 2022-07-23 MED ORDER — INSULIN DETEMIR 100 UNIT/ML ~~LOC~~ SOLN
20.0000 [IU] | Freq: Every day | SUBCUTANEOUS | Status: DC
  Administered 2022-07-23 – 2022-07-28 (×6): 20 [IU] via SUBCUTANEOUS
  Filled 2022-07-23 (×7): qty 0.2

## 2022-07-23 MED ORDER — ONDANSETRON HCL 4 MG PO TABS
4.0000 mg | ORAL_TABLET | Freq: Four times a day (QID) | ORAL | Status: DC | PRN
  Filled 2022-07-23: qty 1

## 2022-07-23 MED ORDER — FENTANYL CITRATE (PF) 250 MCG/5ML IJ SOLN
INTRAMUSCULAR | Status: AC
  Filled 2022-07-23: qty 5

## 2022-07-23 MED ORDER — OXYCODONE HCL 5 MG/5ML PO SOLN: 5.0000 mg | Freq: Once | ORAL | Status: DC | PRN

## 2022-07-23 MED ORDER — DILTIAZEM HCL ER COATED BEADS 180 MG PO CP24
360.0000 mg | ORAL_CAPSULE | Freq: Every day | ORAL | Status: DC
  Administered 2022-07-24 – 2022-07-29 (×6): 360 mg via ORAL
  Filled 2022-07-23 (×6): qty 2

## 2022-07-23 MED ORDER — ONDANSETRON HCL 4 MG/2ML IJ SOLN
INTRAMUSCULAR | Status: DC | PRN
  Administered 2022-07-23: 4 mg via INTRAVENOUS

## 2022-07-23 MED ORDER — AMISULPRIDE (ANTIEMETIC) 5 MG/2ML IV SOLN: 10.0000 mg | Freq: Once | INTRAVENOUS | Status: DC | PRN

## 2022-07-23 MED ORDER — EMPAGLIFLOZIN 25 MG PO TABS
25.0000 mg | ORAL_TABLET | Freq: Every day | ORAL | Status: DC
  Administered 2022-07-24 – 2022-07-29 (×6): 25 mg via ORAL
  Filled 2022-07-23 (×6): qty 1

## 2022-07-23 MED ORDER — OXYCODONE HCL 5 MG PO TABS
10.0000 mg | ORAL_TABLET | ORAL | Status: DC | PRN
  Administered 2022-07-23 – 2022-07-24 (×3): 10 mg via ORAL

## 2022-07-23 MED ORDER — HYDROMORPHONE HCL 1 MG/ML IJ SOLN: 0.5000 mg | INTRAMUSCULAR | Status: DC | PRN

## 2022-07-23 MED ORDER — SIMVASTATIN 20 MG PO TABS
20.0000 mg | ORAL_TABLET | Freq: Every evening | ORAL | Status: DC
  Administered 2022-07-23 – 2022-07-28 (×6): 20 mg via ORAL
  Filled 2022-07-23 (×6): qty 1

## 2022-07-23 MED ORDER — ONDANSETRON HCL 4 MG/2ML IJ SOLN: 4.0000 mg | Freq: Four times a day (QID) | INTRAMUSCULAR | Status: DC | PRN

## 2022-07-23 MED ORDER — BISACODYL 10 MG RE SUPP: 10.0000 mg | Freq: Every day | RECTAL | Status: DC | PRN

## 2022-07-23 MED ORDER — MIDAZOLAM HCL 2 MG/2ML IJ SOLN
INTRAMUSCULAR | Status: AC
  Filled 2022-07-23: qty 2

## 2022-07-23 MED ORDER — MAGNESIUM CITRATE PO SOLN: 1.0000 | Freq: Once | ORAL | Status: DC | PRN

## 2022-07-23 MED ORDER — METOCLOPRAMIDE HCL 5 MG PO TABS: 5.0000 mg | ORAL_TABLET | Freq: Three times a day (TID) | ORAL | Status: DC | PRN

## 2022-07-23 MED ORDER — ACETAMINOPHEN 500 MG PO TABS
1000.0000 mg | ORAL_TABLET | Freq: Once | ORAL | Status: AC
  Administered 2022-07-23: 1000 mg via ORAL
  Filled 2022-07-23: qty 2

## 2022-07-23 MED ORDER — BUPROPION HCL ER (SR) 150 MG PO TB12
150.0000 mg | ORAL_TABLET | ORAL | Status: DC
  Administered 2022-07-24 – 2022-07-29 (×6): 150 mg via ORAL
  Filled 2022-07-23 (×6): qty 1

## 2022-07-23 MED ORDER — PROPOFOL 10 MG/ML IV BOLUS
INTRAVENOUS | Status: DC | PRN
  Administered 2022-07-23: 150 mg via INTRAVENOUS

## 2022-07-23 MED ORDER — CEFAZOLIN IN SODIUM CHLORIDE 3-0.9 GM/100ML-% IV SOLN
3.0000 g | INTRAVENOUS | Status: AC
  Administered 2022-07-23: 3 g via INTRAVENOUS
  Filled 2022-07-23: qty 100

## 2022-07-23 MED ORDER — PHENYLEPHRINE HCL (PRESSORS) 10 MG/ML IV SOLN
INTRAVENOUS | Status: DC | PRN
  Administered 2022-07-23: 160 ug via INTRAVENOUS

## 2022-07-23 MED ORDER — CEFAZOLIN SODIUM-DEXTROSE 2-4 GM/100ML-% IV SOLN
2.0000 g | Freq: Four times a day (QID) | INTRAVENOUS | Status: AC
  Administered 2022-07-23 – 2022-07-24 (×3): 2 g via INTRAVENOUS
  Filled 2022-07-23 (×3): qty 100

## 2022-07-23 MED ORDER — METHOCARBAMOL 500 MG PO TABS
500.0000 mg | ORAL_TABLET | Freq: Four times a day (QID) | ORAL | Status: DC | PRN
  Administered 2022-07-24 – 2022-07-29 (×4): 500 mg via ORAL
  Filled 2022-07-23 (×4): qty 1

## 2022-07-23 MED ORDER — APIXABAN 5 MG PO TABS
5.0000 mg | ORAL_TABLET | Freq: Two times a day (BID) | ORAL | Status: DC
  Administered 2022-07-23 – 2022-07-29 (×12): 5 mg via ORAL
  Filled 2022-07-23 (×12): qty 1

## 2022-07-23 MED ORDER — INSULIN ASPART 100 UNIT/ML IJ SOLN
0.0000 [IU] | INTRAMUSCULAR | Status: AC | PRN
  Administered 2022-07-23 (×2): 2 [IU] via SUBCUTANEOUS

## 2022-07-23 SURGICAL SUPPLY — 34 items
BAG COUNTER SPONGE SURGICOUNT (BAG) ×1 IMPLANT
BAG SPNG CNTER NS LX DISP (BAG) ×1
BLADE SAW SGTL MED 73X18.5 STR (BLADE) IMPLANT
BLADE SURG 21 STRL SS (BLADE) ×1 IMPLANT
BNDG COHESIVE 4X5 TAN STRL (GAUZE/BANDAGES/DRESSINGS) ×1 IMPLANT
BNDG GAUZE DERMACEA FLUFF 4 (GAUZE/BANDAGES/DRESSINGS) ×1 IMPLANT
BNDG GZE DERMACEA 4 6PLY (GAUZE/BANDAGES/DRESSINGS)
COVER SURGICAL LIGHT HANDLE (MISCELLANEOUS) ×2 IMPLANT
DRAPE DERMATAC (DRAPES) IMPLANT
DRAPE U-SHAPE 47X51 STRL (DRAPES) ×2 IMPLANT
DRESSING PEEL AND PLC PRVNA 13 (GAUZE/BANDAGES/DRESSINGS) IMPLANT
DRSG ADAPTIC 3X8 NADH LF (GAUZE/BANDAGES/DRESSINGS) ×1 IMPLANT
DRSG PEEL AND PLACE PREVENA 13 (GAUZE/BANDAGES/DRESSINGS) ×1
DURAPREP 26ML APPLICATOR (WOUND CARE) ×1 IMPLANT
ELECT REM PT RETURN 9FT ADLT (ELECTROSURGICAL) ×1
ELECTRODE REM PT RTRN 9FT ADLT (ELECTROSURGICAL) ×1 IMPLANT
GAUZE PAD ABD 8X10 STRL (GAUZE/BANDAGES/DRESSINGS) ×2 IMPLANT
GAUZE SPONGE 4X4 12PLY STRL (GAUZE/BANDAGES/DRESSINGS) ×1 IMPLANT
GLOVE BIOGEL PI IND STRL 9 (GLOVE) ×1 IMPLANT
GLOVE SURG ORTHO 9.0 STRL STRW (GLOVE) ×1 IMPLANT
GOWN STRL REUS W/ TWL XL LVL3 (GOWN DISPOSABLE) ×2 IMPLANT
GOWN STRL REUS W/TWL XL LVL3 (GOWN DISPOSABLE) ×2
GRAFT SKIN WND MICRO 38 (Tissue) IMPLANT
KIT BASIN OR (CUSTOM PROCEDURE TRAY) ×1 IMPLANT
KIT DRSG PREVENA PLUS 7DAY 125 (MISCELLANEOUS) IMPLANT
KIT TURNOVER KIT B (KITS) ×1 IMPLANT
NS IRRIG 1000ML POUR BTL (IV SOLUTION) ×1 IMPLANT
PACK ORTHO EXTREMITY (CUSTOM PROCEDURE TRAY) ×1 IMPLANT
PAD ARMBOARD 7.5X6 YLW CONV (MISCELLANEOUS) ×2 IMPLANT
STOCKINETTE IMPERVIOUS LG (DRAPES) IMPLANT
SUT ETHILON 2 0 PSLX (SUTURE) ×1 IMPLANT
TOWEL GREEN STERILE (TOWEL DISPOSABLE) ×1 IMPLANT
TUBE CONNECTING 12X1/4 (SUCTIONS) ×1 IMPLANT
YANKAUER SUCT BULB TIP NO VENT (SUCTIONS) ×1 IMPLANT

## 2022-07-23 NOTE — Anesthesia Procedure Notes (Signed)
Procedure Name: LMA Insertion Date/Time: 07/23/2022 9:47 AM  Performed by: Minerva Ends, CRNAPre-anesthesia Checklist: Patient identified, Emergency Drugs available, Suction available and Patient being monitored Patient Re-evaluated:Patient Re-evaluated prior to induction Oxygen Delivery Method: Circle system utilized Preoxygenation: Pre-oxygenation with 100% oxygen Induction Type: IV induction Ventilation: Mask ventilation without difficulty LMA: LMA inserted LMA Size: 4.0 Tube type: Oral Number of attempts: 1 Placement Confirmation: positive ETCO2 and breath sounds checked- equal and bilateral Tube secured with: Tape Dental Injury: Teeth and Oropharynx as per pre-operative assessment

## 2022-07-23 NOTE — Progress Notes (Signed)
Inpatient Diabetes Program Recommendations  AACE/ADA: New Consensus Statement on Inpatient Glycemic Control (2015)  Target Ranges:  Prepandial:   less than 140 mg/dL      Peak postprandial:   less than 180 mg/dL (1-2 hours)      Critically ill patients:  140 - 180 mg/dL   Lab Results  Component Value Date   GLUCAP 209 (H) 07/23/2022   HGBA1C 10.1 (H) 09/08/2016    Review of Glycemic Control  Latest Reference Range & Units 07/23/22 07:57 07/23/22 10:16 07/23/22 10:27 07/23/22 11:00  Glucose-Capillary 70 - 99 mg/dL 200 (H) 201 (H) 202 (H) 209 (H)   Diabetes history: DM  Outpatient Diabetes medications:  Novolog 10-50 units tid with meals Levemir 10-50 units q HS Jardiance 25 mg daily Current orders for Inpatient glycemic control:  Novolog 0-15 units tid with meals  Inpatient Diabetes Program Recommendations:    If patient will be admitted to hospital, consider adding Levemir 25 units q HS.  Also consider adding Novolog 4 units tid with meals (hold if patient eats less than 50% or NPO).   Thanks,  Adah Perl, RN, BC-ADM Inpatient Diabetes Coordinator Pager [phone]  (8a-5p)

## 2022-07-23 NOTE — H&P (Signed)
Oscar [name]. is an 76 y.o. male.   Chief Complaint: Progressive ulceration left forefoot HPI: The patient is a 76 year old gentleman seen in follow up for wagner grade 1 ulcer beneath second metatarsal head on left foot. Also has wound on second toe dorsum of IP joint.  Has been on doxycycline for the last 2 weeks for concern of infection in the left foot.  Has recently been to vascular surgery for ABIs: R 1.09 and L 1.63   He denies fever or chills but has had worsening of the ulcer over the dorsum of the second toe  Past Medical History:  Diagnosis Date   Anal fissure    h/o - no recent complications   Arthritis    hips   Atrial fibrillation (HCC)    CKD (chronic kidney disease)    Depression    Diabetes mellitus without complication (HCC)    Type II   Enlarged prostate    GERD (gastroesophageal reflux disease)    Gout    H/O hiatal hernia    Hard of hearing    History of kidney stones    Hx of typhoid fever    as a child   Hypertension    improved after diet/exercise   Memory change 04/14/2017   OSA (obstructive sleep apnea)    wears cpap   Pneumonia    PONV (postoperative nausea and vomiting)    also difficulty waking up   Stroke ([place]) 08/2013   short term memory loss (slight)    Past Surgical History:  Procedure Laterality Date   AMPUTATION Left 10/02/2017   Procedure: LEFT FOOT 1ST RAY AMPUTATION;  Surgeon: Newt Minion, MD;  Location: [facility];  Service: Orthopedics;  Laterality: Left;   AMPUTATION Left 01/15/2018   Procedure: LEFT FOOT 2ND TOE AMPUTATION;  Surgeon: Newt Minion, MD;  Location: [facility];  Service: Orthopedics;  Laterality: Left;   CARPAL TUNNEL RELEASE Left 12/26/2021   Procedure: CARPAL TUNNEL RELEASE;  Surgeon: Leanora Cover, MD;  Location: [facility];  Service: Orthopedics;  Laterality: Left;   carpel tunnel Bilateral    CATARACT EXTRACTION W/ INTRAOCULAR LENS  IMPLANT, BILATERAL     CHOLECYSTECTOMY     COLONOSCOPY     EYE SURGERY     laser  surgery    I & D EXTREMITY Left 09/15/2014   Procedure: Excision Necrotic Left Achilles, Skin Graft, apply Wound VAC;  Surgeon: Newt Minion, MD;  Location: [facility];  Service: Orthopedics;  Laterality: Left;   KIDNEY STONE SURGERY     KNEE ARTHROSCOPY Bilateral    LIGAMENT REPAIR     TONSILLECTOMY     VASECTOMY      Family History  Problem Relation Age of Onset   COPD Father    Heart attack Sister        died age 37 of MI   Social History:  reports that he has never smoked. He has never used smokeless tobacco. He reports current alcohol use. He reports that he does not use drugs.  Allergies:  Allergies  Allergen Reactions   Iohexol Hives and Other (See Comments)     Desc: HIVES S/P 13HR.PREMEDS, ?LOW DOSAGE PREMEDS    Ivp Dye [Iodinated Contrast Media] Hives and Other (See Comments)    welps    Sulfamethoxazole-Trimethoprim Other (See Comments)    Gi upset    No medications prior to admission.    No results found for this or any previous  visit (from the past 48 hour(s)). No results found.  Review of Systems  All other systems reviewed and are negative.   There were no vitals taken for this visit. Physical Exam  Patient is alert, oriented, no adenopathy, well-dressed, normal affect, normal respiratory effort. On examination of the left foot there is plantar ulcer beneath the second metatarsal head which is 2 cm in diameter and does probe to bone he also has dorsal ulcer to the IP joint of the second toe.  There is exposed bone in the wound bed there is mild surrounding erythema no ascending cellulitis no edema of the foot and ankle Assessment/Plan Ulceration osteomyelitis left foot second metatarsal head and second toe.  Plan: Discussed treatment options.  Recommended proceeding with a left transmetatarsal amputation risk and benefits were discussed including risk of the wound not healing need for additional surgery.  Patient states he understands wished to proceed at this  time.  Newt Minion, MD 07/23/2022, 7:15 AM

## 2022-07-23 NOTE — Progress Notes (Signed)
Orthopedic Tech Progress Note Patient Details:  Oscar [name] July 25, 1946 [id number]  Ortho Devices Type of Ortho Device: Postop shoe/boot Ortho Device/Splint Location: LLE Ortho Device/Splint Interventions: Ordered, Application, Adjustment   Post Interventions Patient Tolerated: Well Instructions Provided: Care of device, Adjustment of device  Oscar [name] 07/23/2022, 8:15 PM

## 2022-07-23 NOTE — Anesthesia Postprocedure Evaluation (Signed)
Anesthesia Post Note  Patient: Oscar [name].  Procedure(s) Performed: LEFT TRANSMETATARSAL AMPUTATION (Left: Foot)     Patient location during evaluation: PACU Anesthesia Type: General Level of consciousness: awake and alert Pain management: pain level controlled Vital Signs Assessment: post-procedure vital signs reviewed and stable Respiratory status: spontaneous breathing, nonlabored ventilation and respiratory function stable Cardiovascular status: blood pressure returned to baseline and stable Postop Assessment: no apparent nausea or vomiting Anesthetic complications: no   No notable events documented.  Last Vitals:  Vitals:   07/23/22 1215 07/23/22 1245  BP: 110/76 102/60  Pulse: (!) 51 (!) 49  Resp: 13   Temp:    SpO2: 92% 93%    Last Pain:  Vitals:   07/23/22 1245  TempSrc:   PainSc: 2                  Lidia Collum

## 2022-07-23 NOTE — Op Note (Signed)
07/23/2022  10:23 AM  PATIENT:  Oscar [name].    PRE-OPERATIVE DIAGNOSIS:  Osteomyelitis Left Foot  POST-OPERATIVE DIAGNOSIS:  Same  PROCEDURE:  LEFT TRANSMETATARSAL AMPUTATION Application Kerecis micro 38 cm. Application of 13 cm wound VAC.  SURGEON:  Newt Minion, MD  PHYSICIAN ASSISTANT:None ANESTHESIA:   General  PREOPERATIVE INDICATIONS:  [name]. is a  76 y.o. male with a diagnosis of Osteomyelitis Left Foot who failed conservative measures and elected for surgical management.    The risks benefits and alternatives were discussed with the patient preoperatively including but not limited to the risks of infection, bleeding, nerve injury, cardiopulmonary complications, the need for revision surgery, among others, and the patient was willing to proceed.  OPERATIVE IMPLANTS: Kerecis micro 38 cm.  '@ENCIMAGES'$ @  OPERATIVE FINDINGS: Patient had calcified vessels no ischemic changes no abscess at the level amputation.  OPERATIVE PROCEDURE: Patient brought the operating room and underwent a general anesthetic.  After adequate levels anesthesia obtained patient's left lower extremity was prepped using DuraPrep draped into a sterile field a timeout was called.  A fishmouth incision was made just proximal to the ulcerative necrotic tissue this was carried sharply down to bone.  A oscillating saw was used to perform a transmetatarsal amputation.  Electrocautery was used hemostasis.  The wound was irrigated with normal saline there is no evidence of abscess or infection at the amputation site the margins were clear.  The wound bed was filled with 38 cm of Kerecis micro.  The incision was closed using 2-0 nylon a 13 cm wound VAC applied this had a good suction fit overwrapped with Coban patient was extubated taken the PACU in stable condition.   DISCHARGE PLANNING:  Antibiotic duration: Continue antibiotics for 24 hours  Weightbearing: Touchdown weightbearing on the  left  Pain medication: Opioid pathway  Dressing care/ Wound VAC: Continue wound VAC for 1 week  Ambulatory devices: Walker  Discharge to: Home.  Follow-up: In the office 1 week post operative.

## 2022-07-23 NOTE — Plan of Care (Signed)
  Problem: Education: Goal: Ability to describe self-care measures that may prevent or decrease complications (Diabetes Survival Skills Education) will improve Outcome: Progressing   Problem: Education: Goal: Knowledge of General Education information will improve Description: Including pain rating scale, medication(s)/side effects and non-pharmacologic comfort measures Outcome: Progressing   Problem: Activity: Goal: Risk for activity intolerance will decrease Outcome: Progressing   Problem: Pain Managment: Goal: General experience of comfort will improve Outcome: Progressing   Problem: Safety: Goal: Ability to remain free from injury will improve Outcome: Progressing   Problem: Skin Integrity: Goal: Risk for impaired skin integrity will decrease Outcome: Progressing

## 2022-07-23 NOTE — Progress Notes (Signed)
Spoke with [employer], lunch tray ordered for patient.

## 2022-07-23 NOTE — Transfer of Care (Signed)
Immediate Anesthesia Transfer of Care Note  Patient: Oscar [name].  Procedure(s) Performed: LEFT TRANSMETATARSAL AMPUTATION (Left: Foot)  Patient Location: PACU  Anesthesia Type:General  Level of Consciousness: sedated  Airway & Oxygen Therapy: Patient Spontanous Breathing and Patient connected to face mask oxygen  Post-op Assessment: Report given to RN and Post -op Vital signs reviewed and stable  Post vital signs: Reviewed and stable  Last Vitals:  Vitals Value Taken Time  BP 103/70 07/23/22 1012  Temp 97.5   Pulse 67 07/23/22 1014  Resp 16 07/23/22 1014  SpO2 94 % 07/23/22 1014  Vitals shown include unvalidated device data.  Last Pain:  Vitals:   07/23/22 0821  TempSrc:   PainSc: 0-No pain         Complications: No notable events documented.

## 2022-07-23 NOTE — Interval H&P Note (Signed)
History and Physical Interval Note:  07/23/2022 9:28 AM  Oscar [name].  has presented today for surgery, with the diagnosis of Osteomyelitis Left Foot.  The various methods of treatment have been discussed with the patient and family. After consideration of risks, benefits and other options for treatment, the patient has consented to  Procedure(s): LEFT TRANSMETATARSAL AMPUTATION (Left) as a surgical intervention.  The patient's history has been reviewed, patient examined, no change in status, stable for surgery.  I have reviewed the patient's chart and labs.  Questions were answered to the patient's satisfaction.     Newt Minion

## 2022-07-24 ENCOUNTER — Encounter (HOSPITAL_COMMUNITY): Payer: Self-pay | Admitting: Orthopedic Surgery

## 2022-07-24 LAB — GLUCOSE, CAPILLARY
Glucose-Capillary: 195 mg/dL — ABNORMAL HIGH (ref 70–99)
Glucose-Capillary: 169 mg/dL — ABNORMAL HIGH (ref 70–99)
Glucose-Capillary: 160 mg/dL — ABNORMAL HIGH (ref 70–99)
Glucose-Capillary: 252 mg/dL — ABNORMAL HIGH (ref 70–99)

## 2022-07-24 LAB — HEMOGLOBIN A1C
Hgb A1c MFr Bld: 7.5 % — ABNORMAL HIGH (ref 4.8–5.6)
Mean Plasma Glucose: 169 mg/dL

## 2022-07-24 LAB — SURGICAL PATHOLOGY, GROSS ONLY (NOT ARMC)

## 2022-07-24 MED ORDER — OXYCODONE-ACETAMINOPHEN 5-325 MG PO TABS: 1.0000 | ORAL_TABLET | ORAL | 0 refills | Status: DC | PRN

## 2022-07-24 NOTE — Progress Notes (Signed)
Inpatient Rehab Admissions Coordinator:  Consult received. Noted pt is under observation status at this time. Pt may not have the medical necessity to warrant an inpatient rehab stay if they remain observation. If status were to change to inpatient, admissions team will assess for candidacy.   Gayland Curry, [place], [place] Admissions Coordinator [phone]

## 2022-07-24 NOTE — Progress Notes (Signed)
Patient ID: Oscar [name]., male   DOB: [birth year], 76 y.o.   MRN: [id number] Plan for discharge today.  Patient states he will use his kneeling scooter for ambulation.  Discussed the importance of minimizing weightbearing on the left foot.  The wound VAC is functioning well minimal drainage.  Patient will need to be shown how to plug the wound VAC and to keep it charged.  Prescription sent for Percocet.

## 2022-07-24 NOTE — TOC Initial Note (Signed)
Transition of Care (TOC) - Initial/Assessment Note    Patient Details  Name: Oscar [name]. MRN: [id number] Date of Birth: 1945-08-16  Transition of Care [facility]) CM/SW Contact:    Sharin Mons, RN Phone Number: 07/24/2022, 10:37 AM  Clinical Narrative:                 -s/p left transmetatarsal amputation, 12/13   NCM @ bedside to spoke with pt regarding d/c planning needs.PTA pt  resided with daughter, Katharine Look and son in law. Pt requested NCM speak with his daughter, Katharine Look [phone]). NCM called and spoke with daughter. NCM shared PT's eval/ recommendation for SNF. Katharine Look states they prefer he not transition to a SNF. Questioned is SNF and option. If not they prefer pt d/c to home with home health services, MD made aware. Per daughter if home health services are needed @ d/c she has no provider preference. Referral made with [facility] pending MD's orders/ F2F.  TOC team following and will assist with TOC needs....  Expected Discharge Plan: Home/Self Care (resides with daughter and son in law) Barriers to Discharge: No Barriers Identified   Patient Goals and CMS Choice        Expected Discharge Plan and Services Expected Discharge Plan: Home/Self Care (resides with daughter and son in law)   Discharge Planning Services: CM Consult   Living arrangements for the past 2 months: Single Family Home Expected Discharge Date: 07/24/22                                    Prior Living Arrangements/Services Living arrangements for the past 2 months: Single Family Home Lives with:: Adult Children Patient language and need for interpreter reviewed:: Yes Do you feel safe going back to the place where you live?: Yes      Need for Family Participation in Patient Care: Yes (Comment) Care giver support system in place?: Yes (comment)   Criminal Activity/Legal Involvement Pertinent to Current Situation/Hospitalization: No - Comment as needed  Activities of  Daily Living Home Assistive Devices/Equipment: Cane (specify quad or straight), Walker (specify type) ADL Screening (condition at time of admission) Patient's cognitive ability adequate to safely complete daily activities?: Yes Is the patient deaf or have difficulty hearing?: No Does the patient have difficulty seeing, even when wearing glasses/contacts?: No Does the patient have difficulty concentrating, remembering, or making decisions?: No Patient able to express need for assistance with ADLs?: Yes Does the patient have difficulty dressing or bathing?: No Independently performs ADLs?: Yes (appropriate for developmental age) Does the patient have difficulty walking or climbing stairs?: No Weakness of Legs: None Weakness of Arms/Hands: None  Permission Sought/Granted      Share Information with NAME: Lonia Skinner  Daughter  [phone]           Emotional Assessment Appearance:: Appears stated age Attitude/Demeanor/Rapport: Engaged Affect (typically observed): Accepting Orientation: : Oriented to Self, Oriented to Place, Oriented to  Time, Oriented to Situation Alcohol / Substance Use: Not Applicable Psych Involvement: No (comment)  Admission diagnosis:  History of transmetatarsal amputation of left foot [facility]) [Z89.432] Patient Active Problem List   Diagnosis Date Noted   History of transmetatarsal amputation of left foot ([place]) 07/23/2022   Pain in left foot 04/21/2018   PVD (peripheral vascular disease) ([place]) 04/21/2018   Amputated toe, left ([place]) 01/21/2018   Achilles tendon contracture, left 12/28/2017   Dehiscence of  amputation stump ([place]) 11/10/2017   Great toe amputation status, left 11/10/2017   Memory change 04/14/2017   Cutaneous abscess of left foot 04/09/2017   Non-pressure chronic ulcer of right heel and midfoot limited to breakdown of skin ([place]) 10/07/2016   Influenza A    AKI (acute kidney injury) ([place]) 09/07/2016   Acute encephalopathy 09/07/2016    Acute respiratory failure with hypoxia (HCC) 09/07/2016   Elevated lactic acid level 09/07/2016   Elevated troponin 09/07/2016   Coronary artery calcification seen on CT scan 10/28/2015   Acute on chronic diastolic (congestive) heart failure ([place]) 09/26/2015   OSA (obstructive sleep apnea) 02/07/2015   Sepsis ([place]) 08/28/2014   Chronic diastolic heart failure ([place]) 09/21/2013   CVA (cerebral infarction) 08/08/2013   Dizziness 08/07/2013   TIA (transient ischemic attack) 08/07/2013   Atrial fibrillation ([place]) 08/07/2013   Hypertension 08/07/2013   Anemia 08/07/2013   Dyspnea 06/21/2013   Chest pain 11/16/2012   Atrial fibrillation with RVR ([place]) 11/16/2012   Gout 11/16/2012   Diabetes mellitus type 2, uncontrolled, with complications ([place]) 34/19/3790   CKD (chronic kidney disease), stage II 11/16/2012   Atypical atrial flutter ([place]) 11/16/2012   Pleuritic chest pain 11/16/2012   Morbid obesity ([place]) 11/16/2012   Obstructive sleep apnea 11/16/2012   PCP:  Kathalene Frames, MD Pharmacy:   [facility], [place] - [address] AT [address] [place] [address] Phone: [phone] Fax: [phone]  [id number] [place] [id number]: [phone]: [phone]    Social Determinants of Health (SDOH) Interventions    Readmission Risk Interventions     No data to display

## 2022-07-24 NOTE — Discharge Summary (Addendum)
Discharge Diagnoses:  Principal Problem:   History of transmetatarsal amputation of left foot ([place])   Surgeries: Procedure(s): LEFT TRANSMETATARSAL AMPUTATION on 07/23/2022    Consultants:   Discharged Condition: Improved  Hospital Course: Oscar [name]. is an 76 y.o. male who was admitted 07/23/2022 with a chief complaint of osteomyelitis left foot, with a final diagnosis of Osteomyelitis Left Foot.  Patient was brought to the operating room on 07/23/2022 and underwent Procedure(s): LEFT TRANSMETATARSAL AMPUTATION.    Patient was given perioperative antibiotics:  Anti-infectives (From admission, onward)    Start     Dose/Rate Route Frequency Ordered Stop   07/23/22 1615  ceFAZolin (ANCEF) IVPB 2g/100 mL premix        2 g 200 mL/hr over 30 Minutes Intravenous Every 6 hours 07/23/22 1526 07/24/22 0459   07/23/22 0815  ceFAZolin (ANCEF) IVPB 3g/100 mL premix        3 g 200 mL/hr over 30 Minutes Intravenous On call to O.R. 07/23/22 5732 07/23/22 0949     .  Patient was given sequential compression devices, early ambulation, and aspirin for DVT prophylaxis.  Recent vital signs: Patient Vitals for the past 24 hrs:  BP Temp Temp src Pulse Resp SpO2 Height Weight  07/24/22 0422 (!) 159/120 (!) 101.1 F (38.4 C) Oral 74 18 98 % -- --  07/23/22 2002 (!) 101/41 98.1 F (36.7 C) Oral 68 16 98 % -- --  07/23/22 1528 114/64 97.7 F (36.5 C) Oral 92 14 98 % -- --  07/23/22 1526 -- 97.7 F (36.5 C) Oral (!) 57 -- -- -- --  07/23/22 1430 -- (!) 97.2 F (36.2 C) -- (!) 53 15 95 % -- --  07/23/22 1415 (!) 98/57 -- -- (!) 52 14 96 % -- --  07/23/22 1345 115/61 -- -- (!) 55 13 95 % -- --  07/23/22 1315 (!) 110/58 -- -- (!) 56 14 95 % -- --  07/23/22 1245 102/60 -- -- (!) 49 -- 93 % -- --  07/23/22 1215 110/76 -- -- (!) 51 13 92 % -- --  07/23/22 1145 118/74 97.6 F (36.4 C) -- 61 18 92 % -- --  07/23/22 1130 105/72 -- -- (!) 56 18 94 % -- --  07/23/22 1115 97/67 -- -- (!) 58 14  94 % -- --  07/23/22 1100 93/62 -- -- (!) 55 15 94 % -- --  07/23/22 1045 (!) 104/56 -- -- 60 12 92 % -- --  07/23/22 1030 99/66 -- -- 66 19 93 % -- --  07/23/22 1015 117/64 97.6 F (36.4 C) -- 74 16 95 % -- --  07/23/22 0755 132/66 98.2 F (36.8 C) Oral 88 18 97 % _0  (1.753 m) 122.5 kg  .  Recent laboratory studies: No results found.  Discharge Medications:   Allergies as of 07/24/2022       Reactions   Iohexol Hives, Other (See Comments)    Desc: HIVES S/P 13HR.PREMEDS, ?LOW DOSAGE PREMEDS   Ivp Dye [iodinated Contrast Media] Hives, Other (See Comments)   welps   Sulfamethoxazole-trimethoprim Other (See Comments)   Gi upset        Medication List     TAKE these medications    acetaminophen 500 MG tablet Commonly known as: TYLENOL Take 1,000 mg by mouth 2 (two) times daily as needed for moderate pain, headache, fever or mild pain.   allopurinol 300 MG tablet Commonly known as: ZYLOPRIM Take 150 mg  by mouth every morning.   B-D INS SYRINGE 0.5CC/30GX1/2" 30G X 1/2" 0.5 ML Misc Generic drug: Insulin Syringe-Needle U-100 USE THREE TIMES A DAY DX-E11.65 INJECTION   blood glucose meter kit and supplies Kit Dispense based on patient and insurance preference. Use up to four times daily as directed. (FOR ICD-9 250.00, 250.01).   buPROPion 150 MG 12 hr tablet Commonly known as: WELLBUTRIN SR Take 150 mg by mouth every morning.   diltiazem 360 MG 24 hr capsule Commonly known as: CARDIZEM CD Take 1 capsule (360 mg total) by mouth daily.   donepezil 10 MG tablet Commonly known as: ARICEPT Take 1 at bedtime What changed:  how much to take how to take this when to take this additional instructions   doxycycline 100 MG tablet Commonly known as: VIBRA-TABS Take 1 tablet (100 mg total) by mouth 2 (two) times daily.   Eliquis 5 MG Tabs tablet Generic drug: apixaban TAKE 1 TABLET BY MOUTH TWICE A DAY   escitalopram 20 MG tablet Commonly known as: LEXAPRO Take  40 mg by mouth every morning.   furosemide 80 MG tablet Commonly known as: LASIX Take 1 tablet (80 mg total) by mouth daily. T   indomethacin 50 MG capsule Commonly known as: INDOCIN Take 1 capsule (50 mg total) by mouth 3 (three) times daily as needed (for gout flare ups ). Reported on 12/27/2015   insulin aspart 100 UNIT/ML injection Commonly known as: novoLOG Inject 10-50 Units into the skin 3 (three) times daily as needed for high blood sugar. Sliding scale   Jardiance 25 MG Tabs tablet Generic drug: empagliflozin Take 25 mg by mouth daily.   Levemir 100 UNIT/ML injection Generic drug: insulin detemir Inject 10-50 Units into the skin at bedtime. Sliding scale   oxyCODONE-acetaminophen 5-325 MG tablet Commonly known as: PERCOCET/ROXICET Take 1 tablet by mouth every 4 (four) hours as needed.   oxymetazoline 0.05 % nasal spray Commonly known as: AFRIN Place 1 spray into both nostrils daily as needed for congestion.   pentoxifylline 400 MG CR tablet Commonly known as: TRENTAL TAKE 1 TABLET BY MOUTH 3 TIMES DAILY WITH MEALS. What changed: See the new instructions.   potassium chloride SA 20 MEQ tablet Commonly known as: Klor-Con M20 Take 1 tablet (20 mEq total) by mouth 2 (two) times daily.   simvastatin 20 MG tablet Commonly known as: ZOCOR Take 20 mg by mouth every evening.   tamsulosin 0.4 MG Caps capsule Commonly known as: FLOMAX Take 1 capsule (0.4 mg total) by mouth daily after breakfast. What changed: when to take this   Vitamin D 50 MCG (2000 UT) tablet Take 4,000 Units by mouth daily.               Discharge Care Instructions  (From admission, onward)           Start     Ordered   07/24/22 0000  Touch down weight bearing       Question Answer Comment  Laterality left   Extremity Lower      07/24/22 0715            Diagnostic Studies: No results found.  Patient benefited maximally from their hospital stay and there were no  complications.     Disposition: Discharge disposition: 01-Home or Self Care      Discharge Instructions     Call MD / Call 911   Complete by: As directed    If you experience chest pain or  shortness of breath, CALL 911 and be transported to the hospital emergency room.  If you develope a fever above 101 F, pus (white drainage) or increased drainage or redness at the wound, or calf pain, call your surgeon's office.   Constipation Prevention   Complete by: As directed    Drink plenty of fluids.  Prune juice may be helpful.  You may use a stool softener, such as Colace (over the counter) 100 mg twice a day.  Use MiraLax (over the counter) for constipation as needed.   Diet - low sodium heart healthy   Complete by: As directed    Increase activity slowly as tolerated   Complete by: As directed    Negative Pressure Wound Therapy - Incisional   Complete by: As directed    Negative Pressure Wound Therapy - Incisional   Complete by: As directed    Instruct patient how to plug in the wound VAC to keep it charged.   Post-operative opioid taper instructions:   Complete by: As directed    POST-OPERATIVE OPIOID TAPER INSTRUCTIONS: It is important to wean off of your opioid medication as soon as possible. If you do not need pain medication after your surgery it is ok to stop day one. Opioids include: Codeine, Hydrocodone(Norco, Vicodin), Oxycodone(Percocet, oxycontin) and hydromorphone amongst others.  Long term and even short term use of opiods can cause: Increased pain response Dependence Constipation Depression Respiratory depression And more.  Withdrawal symptoms can include Flu like symptoms Nausea, vomiting And more Techniques to manage these symptoms Hydrate well Eat regular healthy meals Stay active Use relaxation techniques(deep breathing, meditating, yoga) Do Not substitute Alcohol to help with tapering If you have been on opioids for less than two weeks and do not have  pain than it is ok to stop all together.  Plan to wean off of opioids This plan should start within one week post op of your joint replacement. Maintain the same interval or time between taking each dose and first decrease the dose.  Cut the total daily intake of opioids by one tablet each day Next start to increase the time between doses. The last dose that should be eliminated is the evening dose.      Touch down weight bearing   Complete by: As directed    Laterality: left   Extremity: Lower       Follow-up Information     Newt Minion, MD Follow up in 1 week(s).   Specialty: Orthopedic Surgery Contact information: [address] [place] [address] [phone]                  Signed: Newt Minion 07/24/2022, 7:15 AM

## 2022-07-24 NOTE — Evaluation (Signed)
Physical Therapy Evaluation Patient Details Name: Oscar [name]. MRN: [id number] DOB: Sep 14, 1945 Today's Date: 07/24/2022  History of Present Illness  Pt is 76 yo male who presents for left transmetatarsal amputation on 07/23/22. PMH: HTN, OSA, CVA, typhoid fever in childhood, DMII, CKD, GERD, gout, Afib  Clinical Impression  Pt admitted with above diagnosis. Pt states that he will be going to his daughter's home where she and her husband are RN's. Unclear how much supervision he will have there. On eval, he demonstrated delayed processing and very limited ability to problem solve mobility tasks. Needed max vc's to sequence coming to EOB. Attempted standing to RW 5+ times without success. Pt had difficulty pushing through RLE which he later stated was due to R knee OA. But he also had great difficulty initiating and processing the task. Stedy brought in as well as +2 assist and pt was able to achieve standing with mod A +2. Do not feel that he will be able to safely d/c to daughter's home as he would not even be able to transfer to a w/c at this point. Recommending SNF for rehab first.  Pt currently with functional limitations due to the deficits listed below (see PT Problem List). Pt will benefit from skilled PT to increase their independence and safety with mobility to allow discharge to the venue listed below.          Recommendations for follow up therapy are one component of a multi-disciplinary discharge planning process, led by the attending physician.  Recommendations may be updated based on patient status, additional functional criteria and insurance authorization.  Follow Up Recommendations Skilled nursing-short term rehab (<3 hours/day) Can patient physically be transported by private vehicle: No    Assistance Recommended at Discharge Frequent or constant Supervision/Assistance  Patient can return home with the following  Two people to help with walking and/or transfers;Two people  to help with bathing/dressing/bathroom;Assistance with cooking/housework;Assist for transportation;Help with stairs or ramp for entrance    Equipment Recommendations Wheelchair (measurements PT)  Recommendations for Other Services  OT consult    Functional Status Assessment Patient has had a recent decline in their functional status and demonstrates the ability to make significant improvements in function in a reasonable and predictable amount of time.     Precautions / Restrictions Precautions Precautions: Fall Precaution Comments: L transmet amp Restrictions Weight Bearing Restrictions: Yes LLE Weight Bearing: Touchdown weight bearing Other Position/Activity Restrictions: wound vac      Mobility  Bed Mobility Overal bed mobility: Needs Assistance Bed Mobility: Rolling, Sidelying to Sit Rolling: Min assist Sidelying to sit: Min assist       General bed mobility comments: pt needed max vc's, use of rail and HOB elevated to come to EOB. Had difficulty sequencing task    Transfers Overall transfer level: Needs assistance Equipment used: Ambulation equipment used, Rolling walker (2 wheels) Transfers: Sit to/from Stand Sit to Stand: Mod assist, +2 physical assistance           General transfer comment: attempted sit>stand with RW 5+ times and pt not able to even initiate stand sepite vc's, tactile stimulation and trying various hand positions. Pt relays that he is unable to attempt but not really why. Oscar [name] was brought in at that point and with 3 tries and mod A +2 pt was able to acheive standing. Pt not transferred to chair, would not be able to rise from that position even with stedy    Ambulation/Gait  General Gait Details: unable  Stairs            Wheelchair Mobility    Modified Rankin (Stroke Patients Only)       Balance Overall balance assessment: Needs assistance Sitting-balance support: Feet supported, Bilateral upper extremity  supported Sitting balance-Leahy Scale: Fair Sitting balance - Comments: close supervision given Postural control: Right lateral lean Standing balance support: Bilateral upper extremity supported, During functional activity, Reliant on assistive device for balance Standing balance-Leahy Scale: Zero                               Pertinent Vitals/Pain Pain Assessment Pain Assessment: Faces Faces Pain Scale: Hurts whole lot Pain Location: L foot Pain Descriptors / Indicators: Sore, Grimacing Pain Intervention(s): Limited activity within patient's tolerance, Monitored during session    Home Living Family/patient expects to be discharged to:: Private residence Living Arrangements: Children Available Help at Discharge: Family Type of Home: House Home Access: Stairs to enter Entrance Stairs-Rails: Right Entrance Stairs-Number of Steps: 4   Home Layout: Two level;Able to live on main level with bedroom/bathroom Home Equipment: Rolling Walker (2 wheels) (knee scooter) Additional Comments: pt to d/c to daughter's home. She and her husband are RN's. Pt states his son in law works at [employer]. He can't remember what his daughter does. Unclear how much assist he would have all day    Prior Function Prior Level of Function : Independent/Modified Independent             Mobility Comments: pt reports that he was independent but question accuracy of this       Hand Dominance        Extremity/Trunk Assessment   Upper Extremity Assessment Upper Extremity Assessment: Generalized weakness    Lower Extremity Assessment Lower Extremity Assessment: RLE deficits/detail;LLE deficits/detail;Generalized weakness RLE Deficits / Details: After trying to get up many times unsuccessfully and lying back down, pt mentions that he has R knee OA and pain which makes is difficult to put fillwt on that LE. Hip flex 3-/5. knee ext 3/4 RLE Sensation: decreased proprioception RLE Coordination:  decreased gross motor LLE Deficits / Details: hip flex 2+/5, knee ext 2+/5 LLE Sensation: decreased proprioception LLE Coordination: decreased gross motor    Cervical / Trunk Assessment Cervical / Trunk Assessment: Kyphotic;Other exceptions Cervical / Trunk Exceptions: increased body habitus  Communication   Communication: No difficulties  Cognition Arousal/Alertness: Awake/alert Behavior During Therapy: Flat affect Overall Cognitive Status: No family/caregiver present to determine baseline cognitive functioning Area of Impairment: Following commands, Safety/judgement, Awareness, Problem solving, Memory                     Memory: Decreased short-term memory Following Commands: Follows one step commands consistently, Follows multi-step commands inconsistently, Follows one step commands with increased time, Follows multi-step commands with increased time Safety/Judgement: Decreased awareness of safety, Decreased awareness of deficits Awareness: Emergent Problem Solving: Slow processing, Decreased initiation, Difficulty sequencing, Requires verbal cues, Requires tactile cues General Comments: pt very slow to process and had difficulty problem solving mobility tasks        General Comments General comments (skin integrity, edema, etc.): education given on TDWB but pt not able to keep this effectively in standing.    Exercises     Assessment/Plan    PT Assessment Patient needs continued PT services  PT Problem List Decreased strength;Decreased range of motion;Decreased activity tolerance;Decreased balance;Decreased mobility;Decreased coordination;Decreased cognition;Decreased  knowledge of use of DME;Decreased safety awareness;Decreased knowledge of precautions;Pain;Obesity;Decreased skin integrity       PT Treatment Interventions DME instruction;Gait training;Stair training;Functional mobility training;Therapeutic activities;Therapeutic exercise;Neuromuscular  re-education;Balance training;Patient/family education;Cognitive remediation    PT Goals (Current goals can be found in the Care Plan section)  Acute Rehab PT Goals Patient Stated Goal: go to daughter's house PT Goal Formulation: With patient Time For Goal Achievement: 08/07/22 Potential to Achieve Goals: Fair    Frequency Min 3X/week     Co-evaluation               AM-PAC PT "6 Clicks" Mobility  Outcome Measure Help needed turning from your back to your side while in a flat bed without using bedrails?: A Lot Help needed moving from lying on your back to sitting on the side of a flat bed without using bedrails?: A Lot Help needed moving to and from a bed to a chair (including a wheelchair)?: Total Help needed standing up from a chair using your arms (e.g., wheelchair or bedside chair)?: Total Help needed to walk in hospital room?: Total Help needed climbing 3-5 steps with a railing? : Total 6 Click Score: 8    End of Session Equipment Utilized During Treatment: Gait belt Activity Tolerance: Patient limited by fatigue Patient left: in bed;with call bell/phone within reach;with bed alarm set Nurse Communication: Mobility status PT Visit Diagnosis: Muscle weakness (generalized) (M62.81);Pain;Difficulty in walking, not elsewhere classified (R26.2) Pain - Right/Left: Left Pain - part of body: Ankle and joints of foot    Time: 0910-0942 PT Time Calculation (min) (ACUTE ONLY): 32 min   Charges:   PT Evaluation $PT Eval Moderate Complexity: 1 Mod PT Treatments $Therapeutic Activity: 8-22 mins        Leighton Roach, PT  Acute Rehab Services Secure chat preferred Office [place] 07/24/2022, 9:58 AM

## 2022-07-25 DIAGNOSIS — F03918 Unspecified dementia, unspecified severity, with other behavioral disturbance: Secondary | ICD-10-CM | POA: Diagnosis present

## 2022-07-25 DIAGNOSIS — N1831 Chronic kidney disease, stage 3a: Secondary | ICD-10-CM | POA: Diagnosis present

## 2022-07-25 DIAGNOSIS — Z79899 Other long term (current) drug therapy: Secondary | ICD-10-CM | POA: Diagnosis not present

## 2022-07-25 DIAGNOSIS — Z89412 Acquired absence of left great toe: Secondary | ICD-10-CM | POA: Diagnosis not present

## 2022-07-25 DIAGNOSIS — Z8673 Personal history of transient ischemic attack (TIA), and cerebral infarction without residual deficits: Secondary | ICD-10-CM | POA: Diagnosis not present

## 2022-07-25 DIAGNOSIS — Z794 Long term (current) use of insulin: Secondary | ICD-10-CM | POA: Diagnosis not present

## 2022-07-25 DIAGNOSIS — R3911 Hesitancy of micturition: Secondary | ICD-10-CM | POA: Diagnosis present

## 2022-07-25 DIAGNOSIS — E669 Obesity, unspecified: Secondary | ICD-10-CM | POA: Diagnosis present

## 2022-07-25 DIAGNOSIS — G4733 Obstructive sleep apnea (adult) (pediatric): Secondary | ICD-10-CM | POA: Diagnosis present

## 2022-07-25 DIAGNOSIS — I110 Hypertensive heart disease with heart failure: Secondary | ICD-10-CM | POA: Diagnosis present

## 2022-07-25 DIAGNOSIS — Z89422 Acquired absence of other left toe(s): Secondary | ICD-10-CM | POA: Diagnosis not present

## 2022-07-25 DIAGNOSIS — K219 Gastro-esophageal reflux disease without esophagitis: Secondary | ICD-10-CM | POA: Diagnosis present

## 2022-07-25 DIAGNOSIS — I5032 Chronic diastolic (congestive) heart failure: Secondary | ICD-10-CM | POA: Diagnosis present

## 2022-07-25 DIAGNOSIS — Z9049 Acquired absence of other specified parts of digestive tract: Secondary | ICD-10-CM | POA: Diagnosis not present

## 2022-07-25 DIAGNOSIS — Z89432 Acquired absence of left foot: Secondary | ICD-10-CM | POA: Diagnosis not present

## 2022-07-25 DIAGNOSIS — R413 Other amnesia: Secondary | ICD-10-CM | POA: Diagnosis present

## 2022-07-25 DIAGNOSIS — F32A Depression, unspecified: Secondary | ICD-10-CM | POA: Diagnosis present

## 2022-07-25 DIAGNOSIS — F0393 Unspecified dementia, unspecified severity, with mood disturbance: Secondary | ICD-10-CM | POA: Diagnosis present

## 2022-07-25 DIAGNOSIS — L97529 Non-pressure chronic ulcer of other part of left foot with unspecified severity: Secondary | ICD-10-CM | POA: Diagnosis present

## 2022-07-25 DIAGNOSIS — Z882 Allergy status to sulfonamides status: Secondary | ICD-10-CM | POA: Diagnosis not present

## 2022-07-25 DIAGNOSIS — Z4781 Encounter for orthopedic aftercare following surgical amputation: Secondary | ICD-10-CM | POA: Diagnosis present

## 2022-07-25 DIAGNOSIS — M109 Gout, unspecified: Secondary | ICD-10-CM | POA: Diagnosis present

## 2022-07-25 DIAGNOSIS — M869 Osteomyelitis, unspecified: Secondary | ICD-10-CM | POA: Diagnosis not present

## 2022-07-25 DIAGNOSIS — I10 Essential (primary) hypertension: Secondary | ICD-10-CM | POA: Diagnosis not present

## 2022-07-25 DIAGNOSIS — E11621 Type 2 diabetes mellitus with foot ulcer: Secondary | ICD-10-CM | POA: Diagnosis present

## 2022-07-25 DIAGNOSIS — F0390 Unspecified dementia without behavioral disturbance: Secondary | ICD-10-CM | POA: Diagnosis present

## 2022-07-25 DIAGNOSIS — E785 Hyperlipidemia, unspecified: Secondary | ICD-10-CM | POA: Diagnosis present

## 2022-07-25 DIAGNOSIS — M160 Bilateral primary osteoarthritis of hip: Secondary | ICD-10-CM | POA: Diagnosis present

## 2022-07-25 DIAGNOSIS — E1122 Type 2 diabetes mellitus with diabetic chronic kidney disease: Secondary | ICD-10-CM | POA: Diagnosis present

## 2022-07-25 DIAGNOSIS — I69311 Memory deficit following cerebral infarction: Secondary | ICD-10-CM | POA: Diagnosis not present

## 2022-07-25 DIAGNOSIS — D6489 Other specified anemias: Secondary | ICD-10-CM | POA: Diagnosis present

## 2022-07-25 DIAGNOSIS — I4891 Unspecified atrial fibrillation: Secondary | ICD-10-CM | POA: Diagnosis present

## 2022-07-25 DIAGNOSIS — Z7901 Long term (current) use of anticoagulants: Secondary | ICD-10-CM | POA: Diagnosis not present

## 2022-07-25 DIAGNOSIS — Z7984 Long term (current) use of oral hypoglycemic drugs: Secondary | ICD-10-CM | POA: Diagnosis not present

## 2022-07-25 DIAGNOSIS — N401 Enlarged prostate with lower urinary tract symptoms: Secondary | ICD-10-CM | POA: Diagnosis present

## 2022-07-25 DIAGNOSIS — M868X7 Other osteomyelitis, ankle and foot: Secondary | ICD-10-CM | POA: Diagnosis present

## 2022-07-25 DIAGNOSIS — Z91041 Radiographic dye allergy status: Secondary | ICD-10-CM | POA: Diagnosis not present

## 2022-07-25 DIAGNOSIS — E876 Hypokalemia: Secondary | ICD-10-CM | POA: Diagnosis present

## 2022-07-25 DIAGNOSIS — E1169 Type 2 diabetes mellitus with other specified complication: Secondary | ICD-10-CM | POA: Diagnosis present

## 2022-07-25 DIAGNOSIS — I130 Hypertensive heart and chronic kidney disease with heart failure and stage 1 through stage 4 chronic kidney disease, or unspecified chronic kidney disease: Secondary | ICD-10-CM | POA: Diagnosis present

## 2022-07-25 DIAGNOSIS — N400 Enlarged prostate without lower urinary tract symptoms: Secondary | ICD-10-CM | POA: Diagnosis present

## 2022-07-25 LAB — GLUCOSE, CAPILLARY
Glucose-Capillary: 179 mg/dL — ABNORMAL HIGH (ref 70–99)
Glucose-Capillary: 176 mg/dL — ABNORMAL HIGH (ref 70–99)
Glucose-Capillary: 240 mg/dL — ABNORMAL HIGH (ref 70–99)
Glucose-Capillary: 207 mg/dL — ABNORMAL HIGH (ref 70–99)
Glucose-Capillary: 171 mg/dL — ABNORMAL HIGH (ref 70–99)

## 2022-07-25 NOTE — Progress Notes (Signed)
Physical Therapy Treatment Patient Details Name: Oscar [name]. MRN: [id number] DOB: 08-18-1945 Today's Date: 07/25/2022   History of Present Illness Pt is 76 yo male who presents for left transmetatarsal amputation on 07/23/22. PMH: HTN, OSA, CVA, typhoid fever in childhood, DMII, CKD, GERD, gout, Afib    PT Comments    Pt was seen for mobility to get to chair with two therapists due to skilled nature of his mobility and instruction with mult UE and LE injuries.  Pt is demonstrating limits to use RUE and wb on LLE, but with scooting transition he could be safely assisted to chair with drop arm feature.  Instructions sent to nursing to get him back to bed, and will follow along acutely for goals of PT as are outlined on POC.   Recommendations for follow up therapy are one component of a multi-disciplinary discharge planning process, led by the attending physician.  Recommendations may be updated based on patient status, additional functional criteria and insurance authorization.  Follow Up Recommendations  Skilled nursing-short term rehab (<3 hours/day) Can patient physically be transported by private vehicle: No   Assistance Recommended at Discharge Frequent or constant Supervision/Assistance  Patient can return home with the following Two people to help with walking and/or transfers;Two people to help with bathing/dressing/bathroom;Assistance with cooking/housework;Assist for transportation;Help with stairs or ramp for entrance   Equipment Recommendations  Wheelchair (measurements PT)    Recommendations for Other Services       Precautions / Restrictions Precautions Precautions: Fall Precaution Comments: Left transmetatarsal amputation Restrictions Weight Bearing Restrictions: Yes LLE Weight Bearing: Touchdown weight bearing Other Position/Activity Restrictions: wound vac     Mobility  Bed Mobility Overal bed mobility: Needs Assistance Bed Mobility: Supine to  Sit Rolling: Min assist   Supine to sit: Min assist     General bed mobility comments: used bed rail and    Transfers Overall transfer level: Needs assistance Equipment used: 2 person hand held assist Transfers: Bed to chair/wheelchair/BSC            Lateral/Scoot Transfers: Max assist, +2 physical assistance, +2 safety/equipment General transfer comment: pt required help to initiate scoot to chair due to cath and distraction, cognition, hearing.    Ambulation/Gait               General Gait Details: unable   Stairs             Wheelchair Mobility    Modified Rankin (Stroke Patients Only)       Balance Overall balance assessment: Needs assistance Sitting-balance support: Feet supported Sitting balance-Leahy Scale: Fair                                      Cognition Arousal/Alertness: Awake/alert Behavior During Therapy: WFL for tasks assessed/performed Overall Cognitive Status: No family/caregiver present to determine baseline cognitive functioning Area of Impairment: Following commands, Safety/judgement, Awareness, Problem solving                     Memory: Decreased short-term memory, Decreased recall of precautions Following Commands: Follows one step commands with increased time, Follows one step commands inconsistently Safety/Judgement: Decreased awareness of safety, Decreased awareness of deficits Awareness: Emergent, Intellectual Problem Solving: Slow processing, Requires verbal cues, Decreased initiation General Comments: requires repetitive instructions but can follow through better toward end of session        Exercises  General Comments General comments (skin integrity, edema, etc.): pt is requiring a lot of redirection for instructions to get to chair, but is more focused than he sounded in last rehab note      Pertinent Vitals/Pain Pain Assessment Pain Assessment: Faces Faces Pain Scale: Hurts  little more Pain Location: right elbow and knee during use Pain Descriptors / Indicators: Guarding, Grimacing Pain Intervention(s): Limited activity within patient's tolerance, Monitored during session, Premedicated before session, Repositioned    Home Living Family/patient expects to be discharged to:: Private residence Living Arrangements: Children Available Help at Discharge: Family Type of Home: House Home Access: Stairs to enter Entrance Stairs-Rails: Right Entrance Stairs-Number of Steps: 4   Home Layout: Two level;Able to live on main level with bedroom/bathroom Home Equipment: Rolling Walker (2 wheels) (knee scooter) Additional Comments: Daughter and Son-in-law are RN's.    Prior Function            PT Goals (current goals can now be found in the care plan section) Acute Rehab PT Goals Patient Stated Goal: go to daughter's house Progress towards PT goals: Progressing toward goals    Frequency    Min 3X/week      PT Plan Current plan remains appropriate    Co-evaluation   Reason for Co-Treatment: To address functional/ADL transfers          AM-PAC PT "6 Clicks" Mobility   Outcome Measure  Help needed turning from your back to your side while in a flat bed without using bedrails?: A Lot Help needed moving from lying on your back to sitting on the side of a flat bed without using bedrails?: A Lot Help needed moving to and from a bed to a chair (including a wheelchair)?: Total Help needed standing up from a chair using your arms (e.g., wheelchair or bedside chair)?: Total Help needed to walk in hospital room?: Total Help needed climbing 3-5 steps with a railing? : Total 6 Click Score: 8    End of Session Equipment Utilized During Treatment: Gait belt Activity Tolerance: Patient limited by fatigue Patient left: in chair;with call bell/phone within reach;with chair alarm set Nurse Communication: Mobility status;Other (comment) (discussion of transfer  options to return to bed) PT Visit Diagnosis: Muscle weakness (generalized) (M62.81);Pain;Difficulty in walking, not elsewhere classified (R26.2) Pain - Right/Left: Left Pain - part of body: Ankle and joints of foot     Time: 1347-1420 PT Time Calculation (min) (ACUTE ONLY): 33 min  Charges:  $Therapeutic Activity: 23-37 mins         Ramond Dial 07/25/2022, 3:12 PM   Mee Hives, PT PhD Acute Rehab Dept. Number: [place]

## 2022-07-25 NOTE — Evaluation (Signed)
Occupational Therapy Evaluation Patient Details Name: Oscar [name]. MRN: [id number] DOB: [birth year] Today's Date: 07/25/2022   History of Present Illness Pt is 76 yo male who presents for left transmetatarsal amputation on 07/23/22. PMH: HTN, OSA, CVA, typhoid fever in childhood, DMII, CKD, GERD, gout, Afib   Clinical Impression   Pt in bed upon therapy arrival and agreeable to participate in OT evaluation. Prior to this admit, pt reports that he was independent with ADL tasks and did not require any AD to complete functional mobility. Currently, pt presents with decreased activity tolerance, endurance, and generalized weakness requiring increased physical assistance to complete BADL tasks and functional transfers. Due to pt's reports of severe right knee and elbow arthritis, pt is unable to use RLE only to stand and pvt to recliner. Lateral scoot transfer was then performed with Max Ax2 provided. At this time, pt would benefit from SNF at discharge to focus on mentioned deficits. This OT is aware that family is wishing to take pt home versus d/c to SNF. Family has not been present during therapy sessions and would benefit from pt/family training session prior to discharge if they plan to take patient home.       Recommendations for follow up therapy are one component of a multi-disciplinary discharge planning process, led by the attending physician.  Recommendations may be updated based on patient status, additional functional criteria and insurance authorization.   Follow Up Recommendations  Skilled nursing-short term rehab (<3 hours/day)     Assistance Recommended at Discharge Intermittent Supervision/Assistance  Patient can return home with the following Two people to help with walking and/or transfers;A lot of help with bathing/dressing/bathroom;Assistance with cooking/housework;Assist for transportation;Help with stairs or ramp for entrance    Functional Status Assessment  Patient  has had a recent decline in their functional status and demonstrates the ability to make significant improvements in function in a reasonable and predictable amount of time.  Equipment Recommendations  Other (comment);Wheelchair (measurements OT);Wheelchair cushion (measurements OT) (Drop arm BSC)       Precautions / Restrictions Precautions Precautions: Fall Precaution Comments: Left transmetatarsal amputation Restrictions Weight Bearing Restrictions: Yes LLE Weight Bearing: Touchdown weight bearing Other Position/Activity Restrictions: wound vac      Mobility Bed Mobility Overal bed mobility: Needs Assistance Bed Mobility: Supine to Sit     Supine to sit: Min assist, HOB elevated       Patient Response: Cooperative  Transfers Overall transfer level: Needs assistance Equipment used: 2 person hand held assist, None Transfers: Bed to chair/wheelchair/BSC            Lateral/Scoot Transfers: Max assist, +2 physical assistance, +2 safety/equipment General transfer comment: Due to pt being [facility], directions were repeated as needed. Required increased physical assist to complete lateral scoot initially. Once pt was able to grab arm rest with right hand, he could "Butt walk" the remainder of the way into recliner without physical assist.      Balance Overall balance assessment: Needs assistance Sitting-balance support: No upper extremity supported, Feet supported Sitting balance-Leahy Scale: Fair Sitting balance - Comments: seated EOB     Standing balance-Leahy Scale:  (Unable to stand to assess due to OA in right knee and elbow)           ADL either performed or assessed with clinical judgement   ADL Overall ADL's : Needs assistance/impaired Eating/Feeding: Independent;Sitting   Grooming: Wash/dry hands;Wash/dry face;Oral care;Applying deodorant;Set up;Sitting   Upper Body Bathing: Set up;Sitting   Lower  Body Bathing: Total assistance;Bed level;Sitting/lateral  leans   Upper Body Dressing : Set up;Sitting   Lower Body Dressing: Total assistance;Sitting/lateral leans;Bed level   Toilet Transfer: Maximal assistance;+2 for physical assistance;+2 for safety/equipment Toilet Transfer Details (indicate cue type and reason): lateral scoot simulated to drop arm recliner towards right side Toileting- Clothing Manipulation and Hygiene: Total assistance;Sitting/lateral lean;Bed level               Vision Baseline Vision/History: 0 No visual deficits Ability to See in Adequate Light: 0 Adequate Patient Visual Report: No change from baseline Vision Assessment?: No apparent visual deficits            Pertinent Vitals/Pain Pain Assessment Pain Assessment: Faces Faces Pain Scale: Hurts little more Pain Location: right elbow and knee during use Pain Descriptors / Indicators: Sore, Grimacing Pain Intervention(s): Monitored during session     Hand Dominance Right   Extremity/Trunk Assessment Upper Extremity Assessment Upper Extremity Assessment: Overall WFL for tasks assessed   Lower Extremity Assessment Lower Extremity Assessment: Defer to PT evaluation       Communication Communication Communication: HOH   Cognition Arousal/Alertness: Awake/alert Behavior During Therapy: WFL for tasks assessed/performed Overall Cognitive Status: No family/caregiver present to determine baseline cognitive functioning         Following Commands: Follows one step commands consistently                Home Living Family/patient expects to be discharged to:: Private residence Living Arrangements: Children Available Help at Discharge: Family Type of Home: House Home Access: Stairs to enter Technical brewer of Steps: 4 Entrance Stairs-Rails: Right Home Layout: Two level;Able to live on main level with bedroom/bathroom     Bathroom Shower/Tub: Tub/shower unit         Home Equipment: Conservation officer, nature (2 wheels) (knee scooter)    Additional Comments: Daughter and Son-in-law are RN's.      Prior Functioning/Environment Prior Level of Function : Independent/Modified Independent (pt report)            OT Problem List: Decreased strength;Decreased knowledge of use of DME or AE;Decreased activity tolerance;Impaired balance (sitting and/or standing);Pain      OT Treatment/Interventions: Self-care/ADL training;Modalities;Balance training;Therapeutic exercise;Neuromuscular education;Therapeutic activities;Energy conservation;DME and/or AE instruction;Manual therapy;Patient/family education    OT Goals(Current goals can be found in the care plan section) Acute Rehab OT Goals Patient Stated Goal: to go home OT Goal Formulation: With patient Time For Goal Achievement: 08/08/22 Potential to Achieve Goals: Good  OT Frequency: Min 2X/week    Co-evaluation PT/OT/SLP Co-Evaluation/Treatment: Yes Reason for Co-Treatment: To address functional/ADL transfers          AM-PAC OT "6 Clicks" Daily Activity     Outcome Measure Help from another person eating meals?: None Help from another person taking care of personal grooming?: None Help from another person toileting, which includes using toliet, bedpan, or urinal?: A Lot Help from another person bathing (including washing, rinsing, drying)?: A Lot Help from another person to put on and taking off regular upper body clothing?: None Help from another person to put on and taking off regular lower body clothing?: Total 6 Click Score: 17   End of Session Equipment Utilized During Treatment: Gait belt  Activity Tolerance: Patient tolerated treatment well Patient left: in chair;with call bell/phone within reach;with chair alarm set  OT Visit Diagnosis: Muscle weakness (generalized) (M62.81);Unsteadiness on feet (R26.81)                Time: 7673-4193  OT Time Calculation (min): 24 min Charges:  OT General Charges $OT Visit: 1 Visit OT Evaluation $OT Eval Moderate  Complexity: 1 Mod  [employer], OTR/L,CBIS  Supplemental OT - [facility] and [facility]   Adalaya Irion, Clarene Duke 07/25/2022, 2:34 PM

## 2022-07-25 NOTE — Progress Notes (Signed)
IP rehab admissions - I met with patient at the bedside.  He wants to DC home with [facility] rather than admit to CIR once he is medically ready.  He has a daughter at home who is a Marine scientist.  Dtr's boyfriend is also a Marine scientist.  If patient should change his mind, we can re-assess.  Recommend pursuit of [place] therapies at this time.  Call for questions.  [phone]

## 2022-07-26 LAB — GLUCOSE, CAPILLARY
Glucose-Capillary: 180 mg/dL — ABNORMAL HIGH (ref 70–99)
Glucose-Capillary: 190 mg/dL — ABNORMAL HIGH (ref 70–99)
Glucose-Capillary: 242 mg/dL — ABNORMAL HIGH (ref 70–99)
Glucose-Capillary: 229 mg/dL — ABNORMAL HIGH (ref 70–99)

## 2022-07-26 MED ORDER — DICLOFENAC SODIUM 1 % EX GEL
2.0000 g | Freq: Four times a day (QID) | CUTANEOUS | Status: DC
  Administered 2022-07-26 – 2022-07-29 (×11): 2 g via TOPICAL
  Filled 2022-07-26: qty 100

## 2022-07-26 NOTE — Plan of Care (Signed)

## 2022-07-26 NOTE — TOC Progression Note (Signed)
Transition of Care (TOC) - Progression Note    Patient Details  Name: Oscar [name]. MRN: [id number] Date of Birth: November 30, 1945  Transition of [facility]) CM/SW Contact  Emeterio Reeve, [place] Phone Number: 07/26/2022, 4:00 PM  Clinical Narrative:     CSW spoke with pt at bedside about DC plan.PT reports at DC he will go home with his daughter and son in law, who are both nurses. Pt is open to [facility] services, he does not know his daughters address but will get it from her. Pt declined medicare.gov SNF list.   Expected Discharge Plan: Home/Self Care (resides with daughter and son in law) Barriers to Discharge: No Barriers Identified  Expected Discharge Plan and Services Expected Discharge Plan: Home/Self Care (resides with daughter and son in law)   Discharge Planning Services: CM Consult   Living arrangements for the past 2 months: Single Family Home Expected Discharge Date: 07/24/22                                     Social Determinants of Health (SDOH) Interventions    Readmission Risk Interventions     No data to display

## 2022-07-26 NOTE — Progress Notes (Signed)
Patient ID: Oscar [name]., male   DOB: 10-12-1945, 76 y.o.   MRN: [id number] Patient is status post transmetatarsal amputation on the left.  Patient has had difficulty with assistance getting from the bed to a chair.  Patient complains of degenerative arthritic pain in the right knee as well as tendinitis of the right elbow.  I will write orders for patient to try Voltaren gel 3 times a day to the affected joints.  Anticipate discharge to inpatient versus outpatient rehab.

## 2022-07-27 LAB — GLUCOSE, CAPILLARY
Glucose-Capillary: 127 mg/dL — ABNORMAL HIGH (ref 70–99)
Glucose-Capillary: 175 mg/dL — ABNORMAL HIGH (ref 70–99)
Glucose-Capillary: 210 mg/dL — ABNORMAL HIGH (ref 70–99)
Glucose-Capillary: 201 mg/dL — ABNORMAL HIGH (ref 70–99)

## 2022-07-27 NOTE — Plan of Care (Signed)

## 2022-07-27 NOTE — Progress Notes (Signed)
  Subjective: Patient stable.  Pain very well-controlled.   Objective: Vital signs in last 24 hours: Temp:  [98.1 F (36.7 C)-99.9 F (37.7 C)] 98.1 F (36.7 C) (12/17 0344) Pulse Rate:  [77-81] 79 (12/17 0344) Resp:  [18-20] 19 (12/17 0344) BP: (115-134)/(59-74) 115/67 (12/17 0344) SpO2:  [97 %-100 %] 100 % (12/17 0344)  Intake/Output from previous day: 12/16 0701 - 12/17 0700 In: 600 [P.O.:600] Out: 2300 [Urine:2300] Intake/Output this shift: No intake/output data recorded.  Exam:  Dressing dry.  Labs: No results for input(s): "HGB" in the last 72 hours. No results for input(s): "WBC", "RBC", "HCT", "PLT" in the last 72 hours. No results for input(s): "NA", "K", "CL", "CO2", "BUN", "CREATININE", "GLUCOSE", "CALCIUM" in the last 72 hours. No results for input(s): "LABPT", "INR" in the last 72 hours.  Assessment/Plan: Patient is doing well.  Mobilize with physical therapy today.  Patient is in a hard sole cast shoe.   Oscar [name] 07/27/2022, 9:09 AM

## 2022-07-28 LAB — GLUCOSE, CAPILLARY
Glucose-Capillary: 152 mg/dL — ABNORMAL HIGH (ref 70–99)
Glucose-Capillary: 166 mg/dL — ABNORMAL HIGH (ref 70–99)
Glucose-Capillary: 196 mg/dL — ABNORMAL HIGH (ref 70–99)
Glucose-Capillary: 218 mg/dL — ABNORMAL HIGH (ref 70–99)

## 2022-07-28 NOTE — Care Management Important Message (Signed)
Important Message  Patient Details  Name: Oscar [name]. MRN: [id number] Date of Birth: 1946-01-04   Medicare Important Message Given:  Yes     Deyra Perdomo Montine Circle 07/28/2022, 3:42 PM

## 2022-07-28 NOTE — Progress Notes (Signed)
Patient ID: Oscar Cain., male   DOB: Dec 31, 1945, 76 y.o.   MRN: [id number] Patient states that he will discharge to home with family assisting him.  Discussed that he needs to be more independent with therapy.  Will see how he does today with physical therapy.

## 2022-07-28 NOTE — Progress Notes (Signed)
Physical Therapy Treatment Patient Details Name: Oscar [name]. MRN: [id number] DOB: June 18, 1946 Today's Date: 07/28/2022   History of Present Illness Pt is 76 yo male who presents for left transmetatarsal amputation on 07/23/22. PMH: HTN, OSA, CVA, typhoid fever in childhood, DMII, CKD, GERD, gout, Afib    PT Comments    Pt progressing towards his physical therapy goals; he is agreeable to participate and demonstrates good activity tolerance throughout session. Focus on transfer training this session. Pt requiring moderate assist for slide board transfer from bed to drop arm chair with max multimodal cues for technique. Pt then requiring two person moderate assist for sit to stand from recliner with poor adherence to weightbearing precautions. Would recommend continued training on lateral scoot vs slide board transfer for now in order to maintain weightbearing precautions. Pt would benefit from AIR to address deficits, maximize functional mobility and decrease caregiver burden.    Recommendations for follow up therapy are one component of a multi-disciplinary discharge planning process, led by the attending physician.  Recommendations may be updated based on patient status, additional functional criteria and insurance authorization.  Follow Up Recommendations  Acute inpatient rehab (3hours/day) Can patient physically be transported by private vehicle: No   Assistance Recommended at Discharge Frequent or constant Supervision/Assistance  Patient can return home with the following Assistance with cooking/housework;Assist for transportation;Help with stairs or ramp for entrance;A lot of help with walking and/or transfers;A lot of help with bathing/dressing/bathroom   Equipment Recommendations  Wheelchair (measurements PT);Other (comment);Wheelchair cushion (measurements PT) (slide board, drop arm BSC)    Recommendations for Other Services       Precautions / Restrictions  Precautions Precautions: Fall Required Braces or Orthoses: Other Brace Other Brace: post op shoe Restrictions Weight Bearing Restrictions: Yes LLE Weight Bearing: Touchdown weight bearing     Mobility  Bed Mobility Overal bed mobility: Needs Assistance Bed Mobility: Supine to Sit Rolling: Supervision         General bed mobility comments: Supervision for safety, cueing for execution    Transfers Overall transfer level: Needs assistance Equipment used: Sliding board, Rolling walker (2 wheels) Transfers: Sit to/from Stand, Bed to chair/wheelchair/BSC Sit to Stand: +2 physical assistance, Mod assist          Lateral/Scoot Transfers: Mod assist, With slide board, +2 safety/equipment General transfer comment: Pt performed slide board transfer from bed to chair with modA and multiple scoots required to progress over. Multimodal cues for hand placement, head/hip relationship. Pt then performed additional stand from chair with modA + 2; poor adherence to WB precautions    Ambulation/Gait                   Stairs             Wheelchair Mobility    Modified Rankin (Stroke Patients Only)       Balance Overall balance assessment: Needs assistance Sitting-balance support: Feet supported Sitting balance-Leahy Scale: Fair     Standing balance support: Bilateral upper extremity supported, During functional activity, Reliant on assistive device for balance Standing balance-Leahy Scale: Poor Standing balance comment: heavy reliance on RW                            Cognition Arousal/Alertness: Awake/alert Behavior During Therapy: WFL for tasks assessed/performed Overall Cognitive Status: Impaired/Different from baseline Area of Impairment: Following commands, Safety/judgement, Awareness, Problem solving  Memory: Decreased short-term memory, Decreased recall of precautions Following Commands: Follows one step commands  with increased time, Follows one step commands inconsistently Safety/Judgement: Decreased awareness of safety, Decreased awareness of deficits Awareness: Emergent, Intellectual Problem Solving: Slow processing, Requires verbal cues, Decreased initiation General Comments: Continues to require repetitive instructions and cueing for weightbearing precautions        Exercises      General Comments        Pertinent Vitals/Pain Pain Assessment Pain Assessment: Faces Faces Pain Scale: Hurts a little bit Pain Location: surgical site Pain Descriptors / Indicators: Operative site guarding Pain Intervention(s): Monitored during session    Home Living                          Prior Function            PT Goals (current goals can now be found in the care plan section) Acute Rehab PT Goals Patient Stated Goal: go home Potential to Achieve Goals: Good Progress towards PT goals: Progressing toward goals    Frequency    Min 3X/week      PT Plan Discharge plan needs to be updated    Co-evaluation              AM-PAC PT "6 Clicks" Mobility   Outcome Measure  Help needed turning from your back to your side while in a flat bed without using bedrails?: A Little Help needed moving from lying on your back to sitting on the side of a flat bed without using bedrails?: A Little Help needed moving to and from a bed to a chair (including a wheelchair)?: A Lot Help needed standing up from a chair using your arms (e.g., wheelchair or bedside chair)?: Total Help needed to walk in hospital room?: Total Help needed climbing 3-5 steps with a railing? : Total 6 Click Score: 11    End of Session Equipment Utilized During Treatment: Gait belt;Other (comment) (post op shoe) Activity Tolerance: Patient tolerated treatment well Patient left: in chair;with call bell/phone within reach;with chair alarm set Nurse Communication: Mobility status PT Visit Diagnosis: Muscle weakness  (generalized) (M62.81);Pain;Difficulty in walking, not elsewhere classified (R26.2) Pain - Right/Left: Left Pain - part of body: Ankle and joints of foot     Time: 1306-1330 PT Time Calculation (min) (ACUTE ONLY): 24 min  Charges:  $Therapeutic Activity: 23-37 mins                     Wyona Almas, PT, DPT Acute Rehabilitation Services Office [phone]    Deno Etienne 07/28/2022, 4:13 PM

## 2022-07-28 NOTE — Inpatient Diabetes Management (Signed)
Inpatient Diabetes Program Recommendations  AACE/ADA: New Consensus Statement on Inpatient Glycemic Control  Target Ranges:  Prepandial:   less than 140 mg/dL      Peak postprandial:   less than 180 mg/dL (1-2 hours)      Critically ill patients:  140 - 180 mg/dL    Latest Reference Range & Units 07/27/22 07:32 07/27/22 12:19 07/27/22 17:01 07/27/22 20:26 07/28/22 08:11  Glucose-Capillary 70 - 99 mg/dL 127 (H) 175 (H) 210 (H) 201 (H) 152 (H)   Review of Glycemic Control  Current orders for Inpatient glycemic control: Levemir 20 units QHS, Novolog 4 units TID with meals, Novolog 0-15 units TID with meals, Jardiance 25 mg daily  Inpatient Diabetes Program Recommendations:    Insulin: Please consider increasing meal coverage to Novolog 6 units TID wits meals.  Thanks, Barnie Alderman, RN, MSN, [place] Diabetes Coordinator Inpatient Diabetes Program [phone] (Team Pager from 8am to [place])

## 2022-07-28 NOTE — Progress Notes (Addendum)
Inpatient Rehabilitation Admissions Coordinator   I met with patient , son in law, and nurse at bedside. Pt not yet at a level to discharge directly home . Son in law states he needs to be able to stand pivot to discharge home .Has history of torn meniscus right leg and transmet left foot. I await therapy progress today to clarify if able to tolerate the intensity required of a CIR admit vs SNF level rehab for longer recovery. Acute team and TOC made aware.  Danne Baxter, RN, MSN Rehab Admissions Coordinator [phone] 07/28/2022 12:02 PM

## 2022-07-29 ENCOUNTER — Inpatient Hospital Stay (HOSPITAL_COMMUNITY)
Admission: RE | Admit: 2022-07-29 | Discharge: 2022-08-03 | DRG: 560 | Disposition: A | Discharge status: Home Health | Payer: Medicare Other | Source: Intra-hospital | Attending: Physical Medicine and Rehabilitation | Admitting: Physical Medicine and Rehabilitation

## 2022-07-29 ENCOUNTER — Other Ambulatory Visit: Payer: Self-pay

## 2022-07-29 ENCOUNTER — Encounter (HOSPITAL_COMMUNITY): Payer: Self-pay | Admitting: Physical Medicine and Rehabilitation

## 2022-07-29 DIAGNOSIS — R5381 Other malaise: Secondary | ICD-10-CM | POA: Diagnosis present

## 2022-07-29 DIAGNOSIS — Z89432 Acquired absence of left foot: Secondary | ICD-10-CM | POA: Diagnosis not present

## 2022-07-29 DIAGNOSIS — Z9049 Acquired absence of other specified parts of digestive tract: Secondary | ICD-10-CM

## 2022-07-29 DIAGNOSIS — R3911 Hesitancy of micturition: Secondary | ICD-10-CM | POA: Diagnosis present

## 2022-07-29 DIAGNOSIS — E876 Hypokalemia: Secondary | ICD-10-CM | POA: Diagnosis present

## 2022-07-29 DIAGNOSIS — F0393 Unspecified dementia, unspecified severity, with mood disturbance: Secondary | ICD-10-CM | POA: Diagnosis present

## 2022-07-29 DIAGNOSIS — E1122 Type 2 diabetes mellitus with diabetic chronic kidney disease: Secondary | ICD-10-CM | POA: Diagnosis present

## 2022-07-29 DIAGNOSIS — E669 Obesity, unspecified: Secondary | ICD-10-CM | POA: Diagnosis not present

## 2022-07-29 DIAGNOSIS — Z4781 Encounter for orthopedic aftercare following surgical amputation: Secondary | ICD-10-CM | POA: Diagnosis not present

## 2022-07-29 DIAGNOSIS — M869 Osteomyelitis, unspecified: Secondary | ICD-10-CM | POA: Diagnosis not present

## 2022-07-29 DIAGNOSIS — Z825 Family history of asthma and other chronic lower respiratory diseases: Secondary | ICD-10-CM

## 2022-07-29 DIAGNOSIS — G4733 Obstructive sleep apnea (adult) (pediatric): Secondary | ICD-10-CM | POA: Diagnosis present

## 2022-07-29 DIAGNOSIS — Z794 Long term (current) use of insulin: Secondary | ICD-10-CM | POA: Diagnosis not present

## 2022-07-29 DIAGNOSIS — M109 Gout, unspecified: Secondary | ICD-10-CM | POA: Diagnosis present

## 2022-07-29 DIAGNOSIS — Z91041 Radiographic dye allergy status: Secondary | ICD-10-CM

## 2022-07-29 DIAGNOSIS — Z882 Allergy status to sulfonamides status: Secondary | ICD-10-CM

## 2022-07-29 DIAGNOSIS — K219 Gastro-esophageal reflux disease without esophagitis: Secondary | ICD-10-CM | POA: Diagnosis present

## 2022-07-29 DIAGNOSIS — I4891 Unspecified atrial fibrillation: Secondary | ICD-10-CM | POA: Diagnosis present

## 2022-07-29 DIAGNOSIS — Z961 Presence of intraocular lens: Secondary | ICD-10-CM | POA: Diagnosis present

## 2022-07-29 DIAGNOSIS — Z8249 Family history of ischemic heart disease and other diseases of the circulatory system: Secondary | ICD-10-CM

## 2022-07-29 DIAGNOSIS — I10 Essential (primary) hypertension: Secondary | ICD-10-CM | POA: Diagnosis not present

## 2022-07-29 DIAGNOSIS — F32A Depression, unspecified: Secondary | ICD-10-CM | POA: Diagnosis present

## 2022-07-29 DIAGNOSIS — Z7984 Long term (current) use of oral hypoglycemic drugs: Secondary | ICD-10-CM

## 2022-07-29 DIAGNOSIS — I5032 Chronic diastolic (congestive) heart failure: Secondary | ICD-10-CM | POA: Diagnosis present

## 2022-07-29 DIAGNOSIS — E1169 Type 2 diabetes mellitus with other specified complication: Secondary | ICD-10-CM | POA: Diagnosis not present

## 2022-07-29 DIAGNOSIS — Z7901 Long term (current) use of anticoagulants: Secondary | ICD-10-CM

## 2022-07-29 DIAGNOSIS — N1831 Chronic kidney disease, stage 3a: Secondary | ICD-10-CM | POA: Diagnosis present

## 2022-07-29 DIAGNOSIS — M160 Bilateral primary osteoarthritis of hip: Secondary | ICD-10-CM | POA: Diagnosis present

## 2022-07-29 DIAGNOSIS — D6489 Other specified anemias: Secondary | ICD-10-CM | POA: Diagnosis present

## 2022-07-29 DIAGNOSIS — R001 Bradycardia, unspecified: Secondary | ICD-10-CM | POA: Diagnosis present

## 2022-07-29 DIAGNOSIS — M25522 Pain in left elbow: Secondary | ICD-10-CM | POA: Diagnosis present

## 2022-07-29 DIAGNOSIS — E785 Hyperlipidemia, unspecified: Secondary | ICD-10-CM | POA: Diagnosis present

## 2022-07-29 DIAGNOSIS — M25561 Pain in right knee: Secondary | ICD-10-CM | POA: Diagnosis present

## 2022-07-29 DIAGNOSIS — I130 Hypertensive heart and chronic kidney disease with heart failure and stage 1 through stage 4 chronic kidney disease, or unspecified chronic kidney disease: Secondary | ICD-10-CM | POA: Diagnosis present

## 2022-07-29 DIAGNOSIS — Z79899 Other long term (current) drug therapy: Secondary | ICD-10-CM

## 2022-07-29 DIAGNOSIS — F0390 Unspecified dementia without behavioral disturbance: Secondary | ICD-10-CM | POA: Diagnosis present

## 2022-07-29 DIAGNOSIS — G4700 Insomnia, unspecified: Secondary | ICD-10-CM | POA: Diagnosis present

## 2022-07-29 DIAGNOSIS — R413 Other amnesia: Secondary | ICD-10-CM | POA: Diagnosis present

## 2022-07-29 DIAGNOSIS — Z9841 Cataract extraction status, right eye: Secondary | ICD-10-CM

## 2022-07-29 DIAGNOSIS — Z741 Need for assistance with personal care: Secondary | ICD-10-CM | POA: Diagnosis present

## 2022-07-29 DIAGNOSIS — F03918 Unspecified dementia, unspecified severity, with other behavioral disturbance: Secondary | ICD-10-CM | POA: Diagnosis present

## 2022-07-29 DIAGNOSIS — I69311 Memory deficit following cerebral infarction: Secondary | ICD-10-CM | POA: Diagnosis not present

## 2022-07-29 DIAGNOSIS — N401 Enlarged prostate with lower urinary tract symptoms: Secondary | ICD-10-CM | POA: Diagnosis present

## 2022-07-29 DIAGNOSIS — Z9842 Cataract extraction status, left eye: Secondary | ICD-10-CM

## 2022-07-29 DIAGNOSIS — Z87442 Personal history of urinary calculi: Secondary | ICD-10-CM

## 2022-07-29 DIAGNOSIS — Z888 Allergy status to other drugs, medicaments and biological substances status: Secondary | ICD-10-CM

## 2022-07-29 DIAGNOSIS — H9190 Unspecified hearing loss, unspecified ear: Secondary | ICD-10-CM | POA: Diagnosis present

## 2022-07-29 LAB — GLUCOSE, CAPILLARY
Glucose-Capillary: 187 mg/dL — ABNORMAL HIGH (ref 70–99)
Glucose-Capillary: 197 mg/dL — ABNORMAL HIGH (ref 70–99)
Glucose-Capillary: 157 mg/dL — ABNORMAL HIGH (ref 70–99)
Glucose-Capillary: 156 mg/dL — ABNORMAL HIGH (ref 70–99)
Glucose-Capillary: 206 mg/dL — ABNORMAL HIGH (ref 70–99)

## 2022-07-29 MED ORDER — EMPAGLIFLOZIN 25 MG PO TABS
25.0000 mg | ORAL_TABLET | Freq: Every day | ORAL | Status: DC
  Administered 2022-07-30 – 2022-08-03 (×5): 25 mg via ORAL
  Filled 2022-07-29 (×5): qty 1

## 2022-07-29 MED ORDER — FUROSEMIDE 40 MG PO TABS
80.0000 mg | ORAL_TABLET | Freq: Every day | ORAL | Status: DC
  Administered 2022-07-30 – 2022-08-03 (×5): 80 mg via ORAL
  Filled 2022-07-29 (×6): qty 2

## 2022-07-29 MED ORDER — TRAZODONE HCL 50 MG PO TABS: 25.0000 mg | ORAL_TABLET | Freq: Every evening | ORAL | Status: DC | PRN

## 2022-07-29 MED ORDER — ALUM & MAG HYDROXIDE-SIMETH 200-200-20 MG/5ML PO SUSP: 30.0000 mL | ORAL | Status: DC | PRN

## 2022-07-29 MED ORDER — LIDOCAINE HCL URETHRAL/MUCOSAL 2 % EX GEL: 1.0000 | CUTANEOUS | Status: DC | PRN

## 2022-07-29 MED ORDER — OXYCODONE HCL 5 MG PO TABS
5.0000 mg | ORAL_TABLET | ORAL | Status: DC | PRN
  Administered 2022-07-29: 5 mg via ORAL
  Administered 2022-07-30: 10 mg via ORAL
  Filled 2022-07-29: qty 2
  Filled 2022-07-29: qty 1

## 2022-07-29 MED ORDER — APIXABAN 5 MG PO TABS
5.0000 mg | ORAL_TABLET | Freq: Two times a day (BID) | ORAL | Status: DC
  Administered 2022-07-29 – 2022-08-03 (×10): 5 mg via ORAL
  Filled 2022-07-29 (×11): qty 1

## 2022-07-29 MED ORDER — PROCHLORPERAZINE MALEATE 5 MG PO TABS: 5.0000 mg | ORAL_TABLET | Freq: Four times a day (QID) | ORAL | Status: DC | PRN

## 2022-07-29 MED ORDER — POTASSIUM CHLORIDE CRYS ER 20 MEQ PO TBCR
20.0000 meq | EXTENDED_RELEASE_TABLET | Freq: Two times a day (BID) | ORAL | Status: DC
  Administered 2022-07-29 – 2022-08-03 (×10): 20 meq via ORAL
  Filled 2022-07-29 (×11): qty 1

## 2022-07-29 MED ORDER — DONEPEZIL HCL 10 MG PO TABS
10.0000 mg | ORAL_TABLET | Freq: Every evening | ORAL | Status: DC
  Administered 2022-07-29 – 2022-08-02 (×5): 10 mg via ORAL
  Filled 2022-07-29 (×5): qty 1

## 2022-07-29 MED ORDER — VITAMIN D 25 MCG (1000 UNIT) PO TABS
4000.0000 [IU] | ORAL_TABLET | Freq: Every day | ORAL | Status: DC
  Administered 2022-07-29 – 2022-08-03 (×6): 4000 [IU] via ORAL
  Filled 2022-07-29 (×7): qty 4

## 2022-07-29 MED ORDER — SIMVASTATIN 20 MG PO TABS
20.0000 mg | ORAL_TABLET | Freq: Every evening | ORAL | Status: DC
  Administered 2022-07-29 – 2022-08-02 (×5): 20 mg via ORAL
  Filled 2022-07-29 (×5): qty 1

## 2022-07-29 MED ORDER — INSULIN DETEMIR 100 UNIT/ML ~~LOC~~ SOLN
20.0000 [IU] | Freq: Every day | SUBCUTANEOUS | Status: DC
  Administered 2022-07-29 – 2022-08-02 (×5): 20 [IU] via SUBCUTANEOUS
  Filled 2022-07-29 (×7): qty 0.2

## 2022-07-29 MED ORDER — POLYETHYLENE GLYCOL 3350 17 G PO PACK: 17.0000 g | PACK | Freq: Every day | ORAL | Status: DC | PRN

## 2022-07-29 MED ORDER — DOCUSATE SODIUM 100 MG PO CAPS
100.0000 mg | ORAL_CAPSULE | Freq: Two times a day (BID) | ORAL | Status: DC
  Administered 2022-07-29: 100 mg via ORAL
  Filled 2022-07-29 (×2): qty 1

## 2022-07-29 MED ORDER — PROCHLORPERAZINE 25 MG RE SUPP: 12.5000 mg | Freq: Four times a day (QID) | RECTAL | Status: DC | PRN

## 2022-07-29 MED ORDER — SORBITOL 70 % SOLN
30.0000 mL | Freq: Every day | Status: DC | PRN
  Administered 2022-07-30: 30 mL via ORAL

## 2022-07-29 MED ORDER — PROCHLORPERAZINE EDISYLATE 10 MG/2ML IJ SOLN: 5.0000 mg | Freq: Four times a day (QID) | INTRAMUSCULAR | Status: DC | PRN

## 2022-07-29 MED ORDER — TAMSULOSIN HCL 0.4 MG PO CAPS
0.4000 mg | ORAL_CAPSULE | Freq: Two times a day (BID) | ORAL | Status: DC
  Administered 2022-07-29 – 2022-08-03 (×10): 0.4 mg via ORAL
  Filled 2022-07-29 (×11): qty 1

## 2022-07-29 MED ORDER — OXYCODONE HCL 5 MG PO TABS: 10.0000 mg | ORAL_TABLET | ORAL | Status: DC | PRN

## 2022-07-29 MED ORDER — FLEET ENEMA 7-19 GM/118ML RE ENEM: 1.0000 | ENEMA | Freq: Once | RECTAL | Status: DC | PRN

## 2022-07-29 MED ORDER — ESCITALOPRAM OXALATE 10 MG PO TABS
40.0000 mg | ORAL_TABLET | ORAL | Status: DC
  Administered 2022-07-30 – 2022-08-03 (×5): 40 mg via ORAL
  Filled 2022-07-29 (×6): qty 4

## 2022-07-29 MED ORDER — INSULIN ASPART 100 UNIT/ML IJ SOLN
0.0000 [IU] | Freq: Three times a day (TID) | INTRAMUSCULAR | Status: DC
  Administered 2022-07-29 – 2022-07-30 (×2): 3 [IU] via SUBCUTANEOUS
  Administered 2022-07-30 – 2022-07-31 (×2): 2 [IU] via SUBCUTANEOUS
  Administered 2022-07-31 – 2022-08-01 (×2): 3 [IU] via SUBCUTANEOUS
  Administered 2022-08-01: 2 [IU] via SUBCUTANEOUS
  Administered 2022-08-02: 6 [IU] via SUBCUTANEOUS
  Administered 2022-08-02 (×2): 2 [IU] via SUBCUTANEOUS

## 2022-07-29 MED ORDER — ALLOPURINOL 300 MG PO TABS
150.0000 mg | ORAL_TABLET | ORAL | Status: DC
  Administered 2022-07-30 – 2022-08-03 (×5): 150 mg via ORAL
  Filled 2022-07-29 (×6): qty 1

## 2022-07-29 MED ORDER — METHOCARBAMOL 500 MG PO TABS
500.0000 mg | ORAL_TABLET | Freq: Four times a day (QID) | ORAL | Status: DC | PRN
  Administered 2022-07-29: 500 mg via ORAL
  Filled 2022-07-29: qty 1

## 2022-07-29 MED ORDER — DICLOFENAC SODIUM 1 % EX GEL
2.0000 g | Freq: Four times a day (QID) | CUTANEOUS | Status: DC
  Administered 2022-07-29 – 2022-08-02 (×15): 2 g via TOPICAL
  Filled 2022-07-29: qty 100

## 2022-07-29 MED ORDER — INSULIN ASPART 100 UNIT/ML IJ SOLN
6.0000 [IU] | Freq: Three times a day (TID) | INTRAMUSCULAR | Status: DC
  Administered 2022-07-29 – 2022-08-03 (×12): 6 [IU] via SUBCUTANEOUS

## 2022-07-29 MED ORDER — BUPROPION HCL ER (SR) 150 MG PO TB12
150.0000 mg | ORAL_TABLET | ORAL | Status: DC
  Administered 2022-07-30 – 2022-08-03 (×5): 150 mg via ORAL
  Filled 2022-07-29 (×5): qty 1

## 2022-07-29 MED ORDER — DILTIAZEM HCL ER COATED BEADS 120 MG PO CP24
360.0000 mg | ORAL_CAPSULE | Freq: Every day | ORAL | Status: DC
  Administered 2022-07-30 – 2022-08-03 (×5): 360 mg via ORAL
  Filled 2022-07-29 (×5): qty 3

## 2022-07-29 MED ORDER — TRAZODONE HCL 50 MG PO TABS: 50.0000 mg | ORAL_TABLET | Freq: Every evening | ORAL | Status: DC | PRN

## 2022-07-29 MED ORDER — MELATONIN 5 MG PO TABS: 5.0000 mg | ORAL_TABLET | Freq: Every evening | ORAL | Status: DC | PRN

## 2022-07-29 MED ORDER — GUAIFENESIN-DM 100-10 MG/5ML PO SYRP: 5.0000 mL | ORAL_SOLUTION | Freq: Four times a day (QID) | ORAL | Status: DC | PRN

## 2022-07-29 MED ORDER — ACETAMINOPHEN 325 MG PO TABS
325.0000 mg | ORAL_TABLET | ORAL | Status: DC | PRN
  Administered 2022-07-29: 650 mg via ORAL
  Filled 2022-07-29: qty 2

## 2022-07-29 MED ORDER — OXYCODONE-ACETAMINOPHEN 5-325 MG PO TABS: 1.0000 | ORAL_TABLET | ORAL | 0 refills | Status: DC | PRN

## 2022-07-29 NOTE — H&P (Signed)
Physical Medicine and Rehabilitation Admission H&P     CC: Debility secondary to left foot osteomyelitis s/p TMA   HPI: Oscar [name] is a 76 year old diabetic male with a history of a Wagner grade 1 ulcer beneath the second metatarsal head of the left foot.  He also had a wound on the second toe dorsum of the inner phalangeal joint.  He had been on doxycycline for the last 2 weeks secondary to concern for infection.  He failed conservative measures and elected for surgical management.  He underwent left transmetatarsal amputation by Dr. Sharol Given on 07/23/2022.  His medical history is also significant for chronic diastolic heart failure followed by Dr. Haroldine Laws, atrial fibrillation on Eliquis, dementia on Aricept, chronic kidney disease, obstructive sleep apnea, or CVA.  He remained hemodynamically stable postoperatively.  Diabetic coordinator was consulted for management of diabetes.  Incisional negative pressure vacuum dressing to continue for one week. Touchdown weightbearing left lower extremity. He is tolerating diabetic diet. The patient requires inpatient physical medicine and rehabilitation evaluations and treatment secondary to dysfunction due to left transmetatarsal amputation.   Lives with son but will go home to daughter's home.   Review of Systems  Constitutional:  Negative for chills and fever.  HENT:  Negative for congestion, hearing loss and sore throat.   Eyes:  Positive for blurred vision. Negative for double vision.       Chronic blurry vision from diabetic retinopathy  Respiratory:  Negative for cough and shortness of breath.   Cardiovascular:  Negative for chest pain and palpitations.  Gastrointestinal:  Negative for nausea and vomiting.  Genitourinary:  Negative for dysuria and urgency.  Musculoskeletal:  Positive for joint pain. Negative for falls.       Right knee with torn meniscus, right elbow tendinitis  Skin:  Negative for rash.  Neurological:  Negative for dizziness  and headaches.  Psychiatric/Behavioral:  Negative for depression. The patient does not have insomnia.         Past Medical History:  Diagnosis Date   Anal fissure      h/o - no recent complications   Arthritis      hips   Atrial fibrillation (HCC)     CKD (chronic kidney disease)     Depression     Diabetes mellitus without complication (HCC)      Type II   Enlarged prostate     GERD (gastroesophageal reflux disease)     Gout     H/O hiatal hernia     Hard of hearing     History of kidney stones     Hx of typhoid fever      as a child   Hypertension      improved after diet/exercise   Memory change 04/14/2017   OSA (obstructive sleep apnea)      wears cpap   Pneumonia     PONV (postoperative nausea and vomiting)      also difficulty waking up   Stroke ([place]) 08/2013    short term memory loss (slight)         Past Surgical History:  Procedure Laterality Date   AMPUTATION Left 10/02/2017    Procedure: LEFT FOOT 1ST RAY AMPUTATION;  Surgeon: Newt Minion, MD;  Location: [facility];  Service: Orthopedics;  Laterality: Left;   AMPUTATION Left 01/15/2018    Procedure: LEFT FOOT 2ND TOE AMPUTATION;  Surgeon: Newt Minion, MD;  Location: [facility];  Service: Orthopedics;  Laterality: Left;   AMPUTATION  Left 07/23/2022    Procedure: LEFT TRANSMETATARSAL AMPUTATION;  Surgeon: Newt Minion, MD;  Location: [facility];  Service: Orthopedics;  Laterality: Left;   CARPAL TUNNEL RELEASE Left 12/26/2021    Procedure: CARPAL TUNNEL RELEASE;  Surgeon: Leanora Cover, MD;  Location: [place];  Service: Orthopedics;  Laterality: Left;   carpel tunnel Bilateral     CATARACT EXTRACTION W/ INTRAOCULAR LENS  IMPLANT, BILATERAL       CHOLECYSTECTOMY       COLONOSCOPY       EYE SURGERY        laser surgery    I & D EXTREMITY Left 09/15/2014    Procedure: Excision Necrotic Left Achilles, Skin Graft, apply Wound VAC;  Surgeon: Newt Minion, MD;  Location: [facility];  Service: Orthopedics;  Laterality: Left;    KIDNEY STONE SURGERY       KNEE ARTHROSCOPY Bilateral     LIGAMENT REPAIR       TONSILLECTOMY       VASECTOMY             Family History  Problem Relation Age of Onset   COPD Father     Heart attack Sister          died age 56 of MI    Social History:  reports that he has never smoked. He has never used smokeless tobacco. He reports current alcohol use. He reports that he does not use drugs. Allergies:       Allergies  Allergen Reactions   Iohexol Hives and Other (See Comments)       Desc: HIVES S/P 13HR.PREMEDS, ?LOW DOSAGE PREMEDS     Ivp Dye [Iodinated Contrast Media] Hives and Other (See Comments)      welps     Sulfamethoxazole-Trimethoprim Other (See Comments)      Gi upset          Medications Prior to Admission  Medication Sig Dispense Refill   acetaminophen (TYLENOL) 500 MG tablet Take 1,000 mg by mouth 2 (two) times daily as needed for moderate pain, headache, fever or mild pain.       allopurinol (ZYLOPRIM) 300 MG tablet Take 150 mg by mouth every morning.   11   buPROPion (WELLBUTRIN SR) 150 MG 12 hr tablet Take 150 mg by mouth every morning.   5   Cholecalciferol (VITAMIN D) 50 MCG (2000 UT) tablet Take 4,000 Units by mouth daily.       diltiazem (CARDIZEM CD) 360 MG 24 hr capsule Take 1 capsule (360 mg total) by mouth daily. 90 capsule 0   donepezil (ARICEPT) 10 MG tablet Take 1 at bedtime (Patient taking differently: Take 10 mg by mouth every evening.) 90 tablet 4   doxycycline (VIBRA-TABS) 100 MG tablet Take 1 tablet (100 mg total) by mouth 2 (two) times daily. 30 tablet 0   ELIQUIS 5 MG TABS tablet TAKE 1 TABLET BY MOUTH TWICE A DAY 180 tablet 1   escitalopram (LEXAPRO) 20 MG tablet Take 40 mg by mouth every morning.   3   furosemide (LASIX) 80 MG tablet Take 1 tablet (80 mg total) by mouth daily. T 90 tablet 3   insulin aspart (NOVOLOG) 100 UNIT/ML injection Inject 10-50 Units into the skin 3 (three) times daily as needed for high blood sugar. Sliding scale        JARDIANCE 25 MG TABS tablet Take 25 mg by mouth daily.       LEVEMIR 100 UNIT/ML injection  Inject 10-50 Units into the skin at bedtime. Sliding scale   5   oxymetazoline (AFRIN) 0.05 % nasal spray Place 1 spray into both nostrils daily as needed for congestion.       pentoxifylline (TRENTAL) 400 MG CR tablet TAKE 1 TABLET BY MOUTH 3 TIMES DAILY WITH MEALS. (Patient taking differently: Take 400 mg by mouth in the morning and at bedtime.) 270 tablet 1   potassium chloride SA (KLOR-CON M20) 20 MEQ tablet Take 1 tablet (20 mEq total) by mouth 2 (two) times daily. 180 tablet 1   simvastatin (ZOCOR) 20 MG tablet Take 20 mg by mouth every evening.   3   tamsulosin (FLOMAX) 0.4 MG CAPS capsule Take 1 capsule (0.4 mg total) by mouth daily after breakfast. (Patient taking differently: Take 0.4 mg by mouth 2 (two) times daily.) 30 capsule 0   B-D INS SYRINGE 0.5CC/30GX1/2" 30G X 1/2" 0.5 ML MISC USE THREE TIMES A DAY [id number] INJECTION   2   blood glucose meter kit and supplies KIT Dispense based on patient and insurance preference. Use up to four times daily as directed. (FOR ICD-9 250.00, 250.01). 1 each 0   indomethacin (INDOCIN) 50 MG capsule Take 1 capsule (50 mg total) by mouth 3 (three) times daily as needed (for gout flare ups ). Reported on 12/27/2015 90 capsule 1          Home: Home Living Family/patient expects to be discharged to:: Private residence Living Arrangements:  (son lives with patient) Available Help at Discharge: Family, Available 24 hours/day (to likely go stay at daughter's home at discharge) Type of Home: House Home Access: Stairs to enter [employer] of Steps: 4 Entrance Stairs-Rails: Right Home Layout: Two level, Able to live on main level with bedroom/bathroom Bathroom Shower/Tub: Tub/shower unit Home Equipment: Conservation officer, nature (2 wheels) (knee scooter) Additional Comments: Daughter and Son-in-law are RN's.  Lives With: Son   Functional History: Prior  Function Prior Level of Function : Independent/Modified Independent (pt report) Mobility Comments: pt reports that he was independent but question accuracy of this   Functional Status:  Mobility: Bed Mobility Overal bed mobility: Needs Assistance Bed Mobility: Supine to Sit Rolling: Supervision Sidelying to sit: Min assist Supine to sit: Supervision, HOB elevated General bed mobility comments: heavy use of bedrails though good effort on pt part Transfers Overall transfer level: Needs assistance Equipment used: Sliding board Transfers: Bed to chair/wheelchair/BSC Sit to Stand: +2 physical assistance, Mod assist Bed to/from chair/wheelchair/BSC transfer type:: Lateral/scoot transfer  Lateral/Scoot Transfers: Min assist, +2 safety/equipment, With slide board General transfer comment: Pt initially declined sliding board and opted for scooting to chair though difficult to manage safely and agreed for sliding board placement. Min A x 2 w/ chair blocking to prevent sliding and light assist to scoot up onto board w/ cues to avoid WB through LLE. Ambulation/Gait General Gait Details: unable   ADL: ADL Overall ADL's : Needs assistance/impaired Eating/Feeding: Independent, Sitting Grooming: Wash/dry hands, Wash/dry face, Oral care, Applying deodorant, Set up, Sitting Upper Body Bathing: Set up, Sitting Lower Body Bathing: Total assistance, Bed level, Sitting/lateral leans Upper Body Dressing : Set up, Sitting Lower Body Dressing: Total assistance, Sitting/lateral leans, Bed level Toilet Transfer: Maximal assistance, +2 for physical assistance, +2 for safety/equipment Toilet Transfer Details (indicate cue type and reason): lateral scoot simulated to drop arm recliner towards right side Toileting- Clothing Manipulation and Hygiene: Total assistance, Sitting/lateral lean, Bed level General ADL Comments: Focus on transfer progression, education on  barriers to safe DC home and equipment needs.  Educated on drop arm bari BSC need to allow adequate room for hygiene and clothing mgmt, how ADL assist would likely be bed level if only +1 assist at home   Cognition: Cognition Overall Cognitive Status: Impaired/Different from baseline Orientation Level: Oriented X4 Cognition Arousal/Alertness: Awake/alert Behavior During Therapy: WFL for tasks assessed/performed Overall Cognitive Status: Impaired/Different from baseline Area of Impairment: Safety/judgement, Awareness Memory: Decreased short-term memory, Decreased recall of precautions Following Commands: Follows one step commands with increased time, Follows one step commands inconsistently Safety/Judgement: Decreased awareness of safety, Decreased awareness of deficits Awareness: Emergent Problem Solving: Slow processing, Requires verbal cues, Decreased initiation General Comments: cues for WB, minor cues for safety/sequencing and problem solving safe transfers, but pleasant and eager to participate.   Physical Exam: Blood pressure 112/67, pulse 84, temperature 98 F (36.7 C), resp. rate 18, height _0  (1.753 m), weight 122.5 kg, SpO2 97 %. Physical Exam Constitutional:      General: He is not in acute distress.    Appearance: He is obese.  HENT:     Head: Normocephalic and atraumatic.     Right Ear: External ear normal.     Left Ear: External ear normal.     Nose: Nose normal.     Mouth/Throat:     Mouth: Mucous membranes are moist.     Pharynx: Oropharynx is clear.  Eyes:     General: No scleral icterus.    Extraocular Movements: Extraocular movements intact.     Conjunctiva/sclera: Conjunctivae normal.     Pupils: Pupils are equal, round, and reactive to light.  Cardiovascular:     Rate and Rhythm: Normal rate and regular rhythm.     Heart sounds: No murmur heard.    No gallop.  Pulmonary:     Effort: Pulmonary effort is normal. No respiratory distress.     Breath sounds: Normal breath sounds. No wheezing.   Abdominal:     General: Bowel sounds are normal. There is no distension.     Palpations: Abdomen is soft.     Tenderness: There is no abdominal tenderness.  Musculoskeletal:        General: No swelling.     Cervical back: Normal range of motion.  Skin:    General: Skin is warm and dry.     Comments: Left foot with vac in place, sealed. Right foot intact.   Neurological:     Mental Status: He is alert.     Comments: Alert and oriented x 3. Normal insight and awareness. Intact Memory. Normal language and speech. Cranial nerve exam unremarkable. Motor 5/5 UE, 4-5/5 RLE, LLE 3-4/5 where testable due to vac. Decreased LT right foot, left foot wrapped. DTR's 1+  Psychiatric:        Mood and Affect: Mood normal.        Behavior: Behavior normal.        Lab Results Last 48 Hours        Results for orders placed or performed during the hospital encounter of 07/23/22 (from the past 48 hour(s))  Glucose, capillary     Status: Abnormal    Collection Time: 07/27/22  5:01 PM  Result Value Ref Range    Glucose-Capillary 210 (H) 70 - 99 mg/dL      Comment: Glucose reference range applies only to samples taken after fasting for at least 8 hours.  Glucose, capillary     Status: Abnormal    Collection  Time: 07/27/22  8:26 PM  Result Value Ref Range    Glucose-Capillary 201 (H) 70 - 99 mg/dL      Comment: Glucose reference range applies only to samples taken after fasting for at least 8 hours.  Glucose, capillary     Status: Abnormal    Collection Time: 07/28/22  8:11 AM  Result Value Ref Range    Glucose-Capillary 152 (H) 70 - 99 mg/dL      Comment: Glucose reference range applies only to samples taken after fasting for at least 8 hours.  Glucose, capillary     Status: Abnormal    Collection Time: 07/28/22 11:37 AM  Result Value Ref Range    Glucose-Capillary 166 (H) 70 - 99 mg/dL      Comment: Glucose reference range applies only to samples taken after fasting for at least 8 hours.   Glucose, capillary     Status: Abnormal    Collection Time: 07/28/22  4:11 PM  Result Value Ref Range    Glucose-Capillary 196 (H) 70 - 99 mg/dL      Comment: Glucose reference range applies only to samples taken after fasting for at least 8 hours.  Glucose, capillary     Status: Abnormal    Collection Time: 07/28/22  7:38 PM  Result Value Ref Range    Glucose-Capillary 218 (H) 70 - 99 mg/dL      Comment: Glucose reference range applies only to samples taken after fasting for at least 8 hours.  Glucose, capillary     Status: Abnormal    Collection Time: 07/29/22  8:08 AM  Result Value Ref Range    Glucose-Capillary 187 (H) 70 - 99 mg/dL      Comment: Glucose reference range applies only to samples taken after fasting for at least 8 hours.  Glucose, capillary     Status: Abnormal    Collection Time: 07/29/22 11:32 AM  Result Value Ref Range    Glucose-Capillary 197 (H) 70 - 99 mg/dL      Comment: Glucose reference range applies only to samples taken after fasting for at least 8 hours.      Imaging Results (Last 48 hours)  No results found.         Blood pressure 112/67, pulse 84, temperature 98 F (36.7 C), resp. rate 18, height _0  (1.753 m), weight 122.5 kg, SpO2 97 %.   Medical Problem List and Plan: 1. Functional deficits secondary to debility secondary to left foot osteomyelitis s/p left TMA             -patient may shower if foot/vac kept dry             -ELOS/Goals: 5-7 days, supervision to min assist with mobility and self-care 2.  Antithrombotics: -DVT/anticoagulation:  Pharmaceutical: Eliquis             -antiplatelet therapy: none     3. Pain Management: Tylenol, Robaxin, oxycodone as needed             -Voltaren prn right knee and elbow pain 4. Mood/Behavior/Sleep: LCSW to evaluate and provide emotional support             -antipsychotic agents: n/a             -continue Wellbutrin SR 150 mg q AM, Lexapro 40 mg q AM             -melatonin prn for sleep 5.  Neuropsych/cognition: This patient is capable of making decisions  on his own behalf.   6. Skin/Wound Care: routine skin care checks             -4 x 4 gauze pads and ACE wrap; change daily   7. Fluids/Electrolytes/Nutrition: strict Is and Os and follow-up chemistries             -carb modified diet   8: Left TMA: vac. Transition to dry dressing today as above   9: DM: CBGs q AC and q HS; A1c= 7.5             -continue SSI             -Levemir 20 units q HS             -Novolog increase to 6 units TID with meals; hold if eats less than 50% of meals             -Jardiance 25 mg daily   10: Chronic diastolic CHF: daily weight             -continue diltiazem 360 mg daily             -continue Lasix 80 mg daily   11: Atrial fibrillation:             -continue Eliquis   12: Hypertension:              -continue diltiazem 360 mg daily -continue Lasix 80 mg daily   13: OSA: wears CPAP at night but inconsistently   14: CKD: baseline creatinine 2.1-2.3; follow-up BMP   15: Prior CVA with cognitive impairment: continue Aricept 10 mg q evening   16: Hypokalemia: continue supplementation and follow-up BMP   17: Hyperlipidemia: continue simvastatin   18: Urinary hesitancy/frequency: continue Flomax 0.4 mg daily             -check PVRs             -pt has had some flow issues post-operatively 19: Gout, history of: resume allopurinol 300 mg q AM                      Barbie Banner, PA-C 07/29/2022  I have personally performed a face to face diagnostic evaluation of this patient and formulated the key components of the plan.  Additionally, I have personally reviewed laboratory data, imaging studies, as well as relevant notes and concur with the physician assistant's documentation above.  The patient's status has not changed from the original H&P.  Any changes in documentation from the acute care chart have been noted above.  Meredith Staggers, MD, [facility]

## 2022-07-29 NOTE — Progress Notes (Signed)
Oscar Staggers, MD Physician Physical Medicine and Rehabilitation   PMR Pre-admission    Signed   Date of Service: 07/29/2022 12:51 PM  Related encounter: Admission (Current) from 07/23/2022 in [place]      Show:Clear all _0 Written_1 Templated_2 Copied  Added by: _3 Cristina Gong, RN_4 Oscar Staggers, MD  _5 Hover for details PMR Admission Coordinator Pre-Admission Assessment   Patient: Oscar Michael. is an 76 y.o., male MRN: [id number] DOB: [birth year] Height: _6  (175.3 cm) Weight: 122.5 kg   Insurance Information HMO:     PPO:      PCP:      IPA:      80/20:      OTHER:  PRIMARY: Medicare a and b      Policy#: [id number]      Subscriber: pt Benefits:  Phone #: passport one source online     Name:  [place]. Date: a 02/08/10 and b 12/10/14     Deduct: $1600      Out of Pocket Max: none CIR: 100%      SNF: 20 full days Outpatient: 80%     Co-Pay: 20% Home Health: 100%      Co-Pay: none DME: 80%     Co-Pay: 20% Providers: pt choice  SECONDARY: [employer]      Policy#: [id number]   Financial Counselor:       Phone#:    The "Data Collection Information Summary" for patients in Inpatient Rehabilitation Facilities with attached "Privacy Act [place] Records" was provided and verbally reviewed with: Patient and Family   Emergency Contact Information Contact Information       Name Relation Home Work Mobile    Oscar [name],Oscar [name] Daughter     [phone]    Oscar [name] [phone]        Oscar [name], Oscar [name] [phone]             Current Medical History  Patient Admitting Diagnosis: Transmetatarsal amputation   History of Present Illness: 76 year old male with past medical history of afib, arthritis, CKD, type 2 DM, GERD, Gout, HTN, OSA and CVA. Recent history of wagner grade 1 ulcer beneath second metatarsal head on left foot. Also wound on second toe dorsum of IP joint. Placed  on doxycycline for 2 weeks . Recent vascular surgery for ABI'S R 1.09 and L 1.63. Presented on 07/23/22 with fever, chills and worsening of ulcer.   Workup revealed osteomyelitis left foot. OR 07/23/22 and underwent left transmetatarsal amputation. Postoperative pain control and mobility issues. Recent history of torn mensicus right LE which has limited mobility progression. ASA for DVT prophylaxis.    Patient's medical record from [facility] has been reviewed by the rehabilitation admission coordinator and physician.   Past Medical History      Past Medical History:  Diagnosis Date   Anal fissure      h/o - no recent complications   Arthritis      hips   Atrial fibrillation (HCC)     CKD (chronic kidney disease)     Depression     Diabetes mellitus without complication (HCC)      Type II   Enlarged prostate     GERD (gastroesophageal reflux disease)     Gout     H/O hiatal hernia     Hard of hearing     History of kidney stones     Hx of typhoid fever  as a child   Hypertension      improved after diet/exercise   Memory change 04/14/2017   OSA (obstructive sleep apnea)      wears cpap   Pneumonia     PONV (postoperative nausea and vomiting)      also difficulty waking up   Stroke [facility]) 08/2013    short term memory loss (slight)    Has the patient had major surgery during 100 days prior to admission? Yes   Family History   family history includes COPD in his father; Heart attack in his sister.   Current Medications   Current Facility-Administered Medications:    0.9 %  sodium chloride infusion, , Intravenous, Continuous, Newt Minion, MD, Stopped at 07/24/22 0830   acetaminophen (TYLENOL) tablet 325-650 mg, 325-650 mg, Oral, Q6H PRN, Newt Minion, MD, 650 mg at 07/28/22 2358   apixaban (ELIQUIS) tablet 5 mg, 5 mg, Oral, BID, Newt Minion, MD, 5 mg at 07/29/22 0857   bisacodyl (DULCOLAX) suppository 10 mg, 10 mg, Rectal, Daily PRN, Newt Minion,  MD   buPROPion [facility] SR) 12 hr tablet 150 mg, 150 mg, Oral, BH-q7a, Newt Minion, MD, 150 mg at 07/29/22 7425   diclofenac Sodium (VOLTAREN) 1 % topical gel 2 g, 2 g, Topical, QID, Newt Minion, MD, 2 g at 07/29/22 1230   diltiazem (CARDIZEM CD) 24 hr capsule 360 mg, 360 mg, Oral, Daily, Newt Minion, MD, 360 mg at 07/29/22 0857   docusate sodium (COLACE) capsule 100 mg, 100 mg, Oral, BID, Newt Minion, MD, 100 mg at 07/29/22 0857   donepezil (ARICEPT) tablet 10 mg, 10 mg, Oral, QPM, Newt Minion, MD, 10 mg at 07/28/22 1841   empagliflozin (JARDIANCE) tablet 25 mg, 25 mg, Oral, Daily, Newt Minion, MD, 25 mg at 07/29/22 0857   escitalopram (LEXAPRO) tablet 40 mg, 40 mg, Oral, BH-q7a, Newt Minion, MD, 40 mg at 07/29/22 0636   furosemide (LASIX) tablet 80 mg, 80 mg, Oral, Daily, Newt Minion, MD, 80 mg at 07/29/22 0857   HYDROmorphone (DILAUDID) injection 0.5-1 mg, 0.5-1 mg, Intravenous, Q4H PRN, Newt Minion, MD   insulin aspart (novoLOG) injection 0-15 Units, 0-15 Units, Subcutaneous, TID WC, Newt Minion, MD, 3 Units at 07/29/22 1229   insulin aspart (novoLOG) injection 4 Units, 4 Units, Subcutaneous, TID WC, Newt Minion, MD, 4 Units at 07/29/22 1229   insulin detemir (LEVEMIR) injection 20 Units, 20 Units, Subcutaneous, QHS, Newt Minion, MD, 20 Units at 07/28/22 2108   magnesium citrate solution 1 Bottle, 1 Bottle, Oral, Once PRN, Newt Minion, MD   methocarbamol (ROBAXIN) tablet 500 mg, 500 mg, Oral, Q6H PRN, 500 mg at 07/29/22 0635 **OR** methocarbamol (ROBAXIN) 500 mg in dextrose 5 % 50 mL IVPB, 500 mg, Intravenous, Q6H PRN, Newt Minion, MD   metoCLOPramide (REGLAN) tablet 5-10 mg, 5-10 mg, Oral, Q8H PRN **OR** metoCLOPramide (REGLAN) injection 5-10 mg, 5-10 mg, Intravenous, Q8H PRN, Newt Minion, MD   ondansetron (ZOFRAN) tablet 4 mg, 4 mg, Oral, Q6H PRN **OR** ondansetron (ZOFRAN) injection 4 mg, 4 mg, Intravenous, Q6H PRN, Newt Minion, MD    oxyCODONE (Oxy IR/ROXICODONE) immediate release tablet 10-15 mg, 10-15 mg, Oral, Q4H PRN, Newt Minion, MD, 10 mg at 07/24/22 2153   oxyCODONE (Oxy IR/ROXICODONE) immediate release tablet 5-10 mg, 5-10 mg, Oral, Q4H PRN, Newt Minion, MD, 5 mg at 07/29/22 0124   polyethylene glycol (  MIRALAX / GLYCOLAX) packet 17 g, 17 g, Oral, Daily PRN, Newt Minion, MD   potassium chloride SA (KLOR-CON M) CR tablet 20 mEq, 20 mEq, Oral, BID, Newt Minion, MD, 20 mEq at 07/29/22 0857   simvastatin (ZOCOR) tablet 20 mg, 20 mg, Oral, QPM, Newt Minion, MD, 20 mg at 07/28/22 1841   tamsulosin (FLOMAX) capsule 0.4 mg, 0.4 mg, Oral, BID, Newt Minion, MD, 0.4 mg at 07/29/22 0857   Patients Current Diet:  Diet Order                  Diet - low sodium heart healthy             Diet - low sodium heart healthy             Diet Carb Modified Fluid consistency: Thin; Room service appropriate? Yes  Diet effective now                       Precautions / Restrictions Precautions Precautions: Fall Precaution Comments: Left transmetatarsal amputation, wound vac L foot Other Brace: post op shoe Restrictions Weight Bearing Restrictions: Yes LLE Weight Bearing: Touchdown weight bearing Other Position/Activity Restrictions: wound vac    Has the patient had 2 or more falls or a fall with injury in the past year? No   Prior Activity Level Limited Community (1-2x/wk): Independent at baseline   Prior Functional Level Self Care: Did the patient need help bathing, dressing, using the toilet or eating? Independent   Indoor Mobility: Did the patient need assistance with walking from room to room (with or without device)? Independent   Stairs: Did the patient need assistance with internal or external stairs (with or without device)? Independent   Functional Cognition: Did the patient need help planning regular tasks such as shopping or remembering to take medications? Independent   Patient  Information Are you of Hispanic, Latino/a,or Spanish origin?: A. No, not of Hispanic, Latino/a, or Spanish origin What is your race?: A. White Do you need or want an interpreter to communicate with a doctor or health care staff?: 0. No   Patient's Response To:  Health Literacy and Transportation Is the patient able to respond to health literacy and transportation needs?: Yes Health Literacy - How often do you need to have someone help you when you read instructions, pamphlets, or other written material from your doctor or pharmacy?: Never In the past 12 months, has lack of transportation kept you from medical appointments or from getting medications?: No In the past 12 months, has lack of transportation kept you from meetings, work, or from getting things needed for daily living?: No   Home Assistive Devices / [place] Devices/Equipment: Radio producer (specify quad or straight), Walker (specify type) Home Equipment: Conservation officer, nature (2 wheels) (knee scooter)   Prior Device Use: Indicate devices/aids used by the patient prior to current illness, exacerbation or injury? Recent use of knee walker or RW   Current Functional Level Cognition   Overall Cognitive Status: Impaired/Different from baseline Orientation Level: Oriented X4 Following Commands: Follows one step commands with increased time, Follows one step commands inconsistently Safety/Judgement: Decreased awareness of safety, Decreased awareness of deficits General Comments: cues for WB, minor cues for safety/sequencing and problem solving safe transfers, but pleasant and eager to participate.    Extremity Assessment (includes Sensation/Coordination)   Upper Extremity Assessment: Overall WFL for tasks assessed  Lower Extremity Assessment: Defer to PT evaluation RLE Deficits /  Details: After trying to get up many times unsuccessfully and lying back down, pt mentions that he has R knee OA and pain which makes is difficult to put  fillwt on that LE. Hip flex 3-/5. knee ext 3/4 RLE Sensation: decreased proprioception RLE Coordination: decreased gross motor LLE Deficits / Details: hip flex 2+/5, knee ext 2+/5 LLE Sensation: decreased proprioception LLE Coordination: decreased gross motor     ADLs   Overall ADL's : Needs assistance/impaired Eating/Feeding: Independent, Sitting Grooming: Wash/dry hands, Wash/dry face, Oral care, Applying deodorant, Set up, Sitting Upper Body Bathing: Set up, Sitting Lower Body Bathing: Total assistance, Bed level, Sitting/lateral leans Upper Body Dressing : Set up, Sitting Lower Body Dressing: Total assistance, Sitting/lateral leans, Bed level Toilet Transfer: Maximal assistance, +2 for physical assistance, +2 for safety/equipment Toilet Transfer Details (indicate cue type and reason): lateral scoot simulated to drop arm recliner towards right side Toileting- Clothing Manipulation and Hygiene: Total assistance, Sitting/lateral lean, Bed level General ADL Comments: Focus on transfer progression, education on barriers to safe DC home and equipment needs. Educated on drop arm bari BSC need to allow adequate room for hygiene and clothing mgmt, how ADL assist would likely be bed level if only +1 assist at home     Mobility   Overal bed mobility: Needs Assistance Bed Mobility: Supine to Sit Rolling: Supervision Sidelying to sit: Min assist Supine to sit: Supervision, HOB elevated General bed mobility comments: heavy use of bedrails though good effort on pt part     Transfers   Overall transfer level: Needs assistance Equipment used: Sliding board Transfers: Bed to chair/wheelchair/BSC Sit to Stand: +2 physical assistance, Mod assist Bed to/from chair/wheelchair/BSC transfer type:: Lateral/scoot transfer  Lateral/Scoot Transfers: Min assist, +2 safety/equipment, With slide board General transfer comment: Pt initially declined sliding board and opted for scooting to chair though  difficult to manage safely and agreed for sliding board placement. Min A x 2 w/ chair blocking to prevent sliding and light assist to scoot up onto board w/ cues to avoid WB through LLE.     Ambulation / Gait / Stairs / Wheelchair Mobility   Ambulation/Gait General Gait Details: unable     Posture / Balance Dynamic Sitting Balance Sitting balance - Comments: seated EOB Balance Overall balance assessment: Needs assistance Sitting-balance support: Feet supported Sitting balance-Leahy Scale: Fair Sitting balance - Comments: seated EOB Postural control: Right lateral lean Standing balance support: Bilateral upper extremity supported, During functional activity, Reliant on assistive device for balance Standing balance-Leahy Scale: Poor Standing balance comment: heavy reliance on RW     Special needs/care consideration CPAP at HS Fall precautions    Previous Home Environment  Living Arrangements:  (son lives with patient)  Lives With: Son Available Help at Discharge: Family, Available 24 hours/day (to likely go stay at daughter's home at discharge) Type of Home: House Home Layout: Two level, Able to live on main level with bedroom/bathroom Home Access: Stairs to enter Entrance Stairs-Rails: Right Entrance Stairs-Number of Steps: 4 Bathroom Shower/Tub: Tub/shower unit Home Care Services: No Additional Comments: Daughter and Son-in-law are RN's.   Discharge Living Setting Plans for Discharge Living Setting: Patient's home, House, Lives with (comment) (son lives with patient) Type of Home at Discharge: House Discharge Home Layout: Two level, Able to live on main level with bedroom/bathroom Discharge Home Access: Stairs to enter Entrance Stairs-Rails: Right Entrance Stairs-Number of Steps: 4 Discharge Bathroom Shower/Tub: Tub/shower unit Discharge Bathroom Toilet: Handicapped height Discharge Bathroom Accessibility: Yes How Accessible:  Accessible via walker Does the patient have  any problems obtaining your medications?: No   Social/Family/Support Systems Patient Roles: Parent Contact Information: daughter, Jolayne Haines Anticipated Caregiver: daughter and son in law Anticipated Caregiver's Contact Information: see contacts Ability/Limitations of Caregiver: dtr and SIL are working nurses, will alternate schedule to provide 24/7 assist Caregiver Availability: 24/7 Discharge Plan Discussed with Primary Caregiver: Yes Is Caregiver In Agreement with Plan?: Yes Does Caregiver/Family have Issues with Lodging/Transportation while Pt is in Rehab?: No   Goals Patient/Family Goal for Rehab: supervision to min assist with PT and OT at wheelchair level Expected length of stay: ELOS 5 to 7 days or sooner; wants to be home for Christmas Additional Information: Will either return to his home or go stay at Daughter's home Pt/Family Agrees to Admission and willing to participate: Yes Program Orientation Provided & Reviewed with Pt/Caregiver Including Roles  & Responsibilities: Yes   Decrease burden of Care through IP rehab admission: n/a   Possible need for SNF placement upon discharge: not anticipated   Patient Condition: I have reviewed medical records from [facility], spoken with patient, daughter, and family member. I met with patient at the bedside for inpatient rehabilitation assessment.  Patient will benefit from ongoing PT and OT, can actively participate in 3 hours of therapy a day 5 days of the week, and can make measurable gains during the admission.  Patient will also benefit from the coordinated team approach during an Inpatient Acute Rehabilitation admission.  The patient will receive intensive therapy as well as Rehabilitation physician, nursing, social worker, and care management interventions.  Due to bladder management, bowel management, safety, skin/wound care, disease management, medication administration, pain management, and patient education the patient  requires 24 hour a day rehabilitation nursing.  The patient is currently min assist slide board transfers with mobility and basic ADLs.  Discharge setting and therapy post discharge at home with home health is anticipated.  Patient has agreed to participate in the Acute Inpatient Rehabilitation Program and will admit today.   Preadmission Screen Completed By:  Cleatrice Burke, 07/29/2022 12:51 PM ______________________________________________________________________   Discussed status with Dr. Naaman Plummer on 07/29/22 at 1258 and received approval for admission today.   Admission Coordinator:  Cleatrice Burke, RN, time 3154 Date 07/29/22    Assessment/Plan: Diagnosis:osteomyelitis-->left TMA Does the need for close, 24 hr/day Medical supervision in concert with the patient's rehab needs make it unreasonable for this patient to be served in a less intensive setting? Yes Co-Morbidities requiring supervision/potential complications: gout, dm2, htn, osa, ckd Due to bladder management, bowel management, safety, skin/wound care, disease management, medication administration, pain management, and patient education, does the patient require 24 hr/day rehab nursing? Yes Does the patient require coordinated care of a physician, rehab nurse, PT, OT to address physical and functional deficits in the context of the above medical diagnosis(es)? Yes Addressing deficits in the following areas: balance, endurance, locomotion, strength, transferring, bowel/bladder control, bathing, dressing, feeding, grooming, toileting, and psychosocial support Can the patient actively participate in an intensive therapy program of at least 3 hrs of therapy 5 days a week? Yes The potential for patient to make measurable gains while on inpatient rehab is excellent Anticipated functional outcomes upon discharge from inpatient rehab: supervision and min assist PT, supervision and min assist OT, n/a SLP Estimated rehab length  of stay to reach the above functional goals is: 5-7 days Anticipated discharge destination: Home 10. Overall Rehab/Functional Prognosis: excellent     MD Signature:  Oscar Staggers, MD, [place] Director Rehabilitation Services 07/29/2022          Revision History

## 2022-07-29 NOTE — Progress Notes (Signed)
Inpatient Rehabilitation Admissions Coordinator   Met at bedside with patient , daughter and with therapy. After much discussion on the benefits of a brief CIR admit , patient now in agreement to Cir admit today. I have alerted acute team and TOC. I will make the arrangements to admit today.  Danne Baxter, RN, MSN Rehab Admissions Coordinator [phone] 07/29/2022 12:36 PM

## 2022-07-29 NOTE — Progress Notes (Signed)
Occupational Therapy Treatment Patient Details Name: Oscar [name]. MRN: [id number] DOB: [birth year] Today's Date: 07/29/2022   History of present illness Pt is 76 yo male who presents for left transmetatarsal amputation on 07/23/22. PMH: HTN, OSA, CVA, typhoid fever in childhood, DMII, CKD, GERD, gout, Afib   OT comments  Pt making incremental progress towards OT goals and eager to participate. Focus on progression of sliding board transfers with overall Min A x 2 and good effort on pt's part. Cues still needed for safe sequencing and LLE WB precautions. Pt's daughter present and engaged in discussion re: continued improvements in transfers to translate to bari drop arm BSC use and other skills to be addressed to maximize safety with ADL tasks at home. Also educated that if pt were to DC straight home, would likely need ADL assist bed level if only +1 assist available at home. Feel pt would progress transfers/basic ADLs quickly with AIR level therapies and w/ the appropriate DME.    Recommendations for follow up therapy are one component of a multi-disciplinary discharge planning process, led by the attending physician.  Recommendations may be updated based on patient status, additional functional criteria and insurance authorization.    Follow Up Recommendations  Acute inpatient rehab (3hours/day)     Assistance Recommended at Discharge Intermittent Supervision/Assistance  Patient can return home with the following  Two people to help with walking and/or transfers;A lot of help with bathing/dressing/bathroom;Assistance with cooking/housework;Assist for transportation;Help with stairs or ramp for entrance   Equipment Recommendations  Wheelchair (measurements OT);Wheelchair cushion (measurements OT);Hospital bed;Other (comment) (bariatric drop arm BSC; sliding board)    Recommendations for Other Services Rehab consult    Precautions / Restrictions Precautions Precautions:  Fall Precaution Comments: Left transmetatarsal amputation, wound vac L foot Required Braces or Orthoses: Other Brace Other Brace: post op shoe Restrictions Weight Bearing Restrictions: Yes LLE Weight Bearing: Touchdown weight bearing       Mobility Bed Mobility Overal bed mobility: Needs Assistance Bed Mobility: Supine to Sit     Supine to sit: Supervision, HOB elevated     General bed mobility comments: heavy use of bedrails though good effort on pt part    Transfers Overall transfer level: Needs assistance Equipment used: Sliding board Transfers: Bed to chair/wheelchair/BSC            Lateral/Scoot Transfers: Min assist, +2 safety/equipment, With slide board General transfer comment: Pt initially declined sliding board and opted for scooting to chair though difficult to manage safely and agreed for sliding board placement. Min A x 2 w/ chair blocking to prevent sliding and light assist to scoot up onto board w/ cues to avoid WB through LLE.     Balance Overall balance assessment: Needs assistance Sitting-balance support: Feet supported Sitting balance-Leahy Scale: Fair                                     ADL either performed or assessed with clinical judgement   ADL Overall ADL's : Needs assistance/impaired                                       General ADL Comments: Focus on transfer progression, education on barriers to safe DC home and equipment needs. Educated on drop arm bari BSC need to allow adequate room for hygiene and  clothing mgmt, how ADL assist would likely be bed level if only +1 assist at home    Extremity/Trunk Assessment Upper Extremity Assessment Upper Extremity Assessment: Overall WFL for tasks assessed   Lower Extremity Assessment Lower Extremity Assessment: Defer to PT evaluation        Vision   Vision Assessment?: No apparent visual deficits   Perception     Praxis      Cognition  Arousal/Alertness: Awake/alert Behavior During Therapy: WFL for tasks assessed/performed Overall Cognitive Status: Impaired/Different from baseline Area of Impairment: Safety/judgement, Awareness                         Safety/Judgement: Decreased awareness of safety, Decreased awareness of deficits Awareness: Emergent   General Comments: cues for WB, minor cues for safety/sequencing and problem solving safe transfers, but pleasant and eager to participate.        Exercises      Shoulder Instructions       General Comments Daughter present, supportive and engaged. Noted scrotal edema though pt unsure if progressed from baseline though agreeable for elevation to observe changes in edema    Pertinent Vitals/ Pain       Pain Assessment Pain Assessment: No/denies pain  Home Living                                          Prior Functioning/Environment              Frequency  Min 2X/week        Progress Toward Goals  OT Goals(current goals can now be found in the care plan section)  Progress towards OT goals: Progressing toward goals  Acute Rehab OT Goals Patient Stated Goal: get better by christmas OT Goal Formulation: With patient Time For Goal Achievement: 08/08/22 Potential to Achieve Goals: Good ADL Goals Pt Will Perform Lower Body Bathing: with mod assist;sitting/lateral leans;bed level Pt Will Perform Lower Body Dressing: with mod assist;sitting/lateral leans;bed level;with adaptive equipment Pt Will Transfer to Toilet: with mod assist;with transfer board;anterior/posterior transfer;bedside commode Additional ADL Goal #1: Pt will increase bed mobility to SBA in order to transition to sitting on OOB to prepare for functional transfer to BSC/recliner.  Plan Discharge plan needs to be updated    Co-evaluation                 AM-PAC OT "6 Clicks" Daily Activity     Outcome Measure   Help from another person eating  meals?: None Help from another person taking care of personal grooming?: A Little Help from another person toileting, which includes using toliet, bedpan, or urinal?: A Lot Help from another person bathing (including washing, rinsing, drying)?: A Lot Help from another person to put on and taking off regular upper body clothing?: A Little Help from another person to put on and taking off regular lower body clothing?: Total 6 Click Score: 15    End of Session Equipment Utilized During Treatment: Gait belt  OT Visit Diagnosis: Muscle weakness (generalized) (M62.81);Unsteadiness on feet (R26.81)   Activity Tolerance Patient tolerated treatment well   Patient Left in chair;with call bell/phone within reach;with family/visitor present   Nurse Communication Mobility status        Time: 8657-8469 OT Time Calculation (min): 26 min  Charges: OT General Charges $OT Visit: 1 Visit OT Treatments $Self Care/Home  Management : 8-22 mins $Therapeutic Activity: 8-22 mins  Malachy Chamber, OTR/L Acute Rehab Services Office: [phone]   Layla Maw 07/29/2022, 12:37 PM

## 2022-07-29 NOTE — Discharge Summary (Signed)
Discharge Diagnoses:  Principal Problem:   History of transmetatarsal amputation of left foot ([place])   Surgeries: Procedure(s): LEFT TRANSMETATARSAL AMPUTATION on 07/23/2022    Consultants:   Discharged Condition: Improved  Hospital Course: Oscar [name]. is an 76 y.o. male who was admitted 07/23/2022 with a chief complaint of osteomyelitis left foot, with a final diagnosis of Osteomyelitis Left Foot.  Patient was brought to the operating room on 07/23/2022 and underwent Procedure(s): LEFT TRANSMETATARSAL AMPUTATION.    Patient was given perioperative antibiotics:  Anti-infectives (From admission, onward)    Start     Dose/Rate Route Frequency Ordered Stop   07/23/22 1615  ceFAZolin (ANCEF) IVPB 2g/100 mL premix        2 g 200 mL/hr over 30 Minutes Intravenous Every 6 hours 07/23/22 1526 07/24/22 0459   07/23/22 0815  ceFAZolin (ANCEF) IVPB 3g/100 mL premix        3 g 200 mL/hr over 30 Minutes Intravenous On call to O.R. 07/23/22 8413 07/23/22 0949     .  Patient was given sequential compression devices, early ambulation, and aspirin for DVT prophylaxis.  Recent vital signs: Patient Vitals for the past 24 hrs:  BP Temp Temp src Pulse Resp SpO2  07/29/22 0403 126/71 98 F (36.7 C) -- 80 17 96 %  07/29/22 0122 (!) 111/42 -- -- -- -- --  07/28/22 2111 136/72 -- Oral -- -- 99 %  07/28/22 1449 100/74 98.2 F (36.8 C) Oral 84 -- 96 %  07/28/22 1144 134/79 -- -- -- -- --  07/28/22 0810 (!) 98/56 97.8 F (36.6 C) Oral 89 -- 97 %  .  Recent laboratory studies: No results found.  Discharge Medications:   Allergies as of 07/29/2022       Reactions   Iohexol Hives, Other (See Comments)    Desc: HIVES S/P 13HR.PREMEDS, ?LOW DOSAGE PREMEDS   Ivp Dye [iodinated Contrast Media] Hives, Other (See Comments)   welps   Sulfamethoxazole-trimethoprim Other (See Comments)   Gi upset        Medication List     TAKE these medications    acetaminophen 500 MG  tablet Commonly known as: TYLENOL Take 1,000 mg by mouth 2 (two) times daily as needed for moderate pain, headache, fever or mild pain.   allopurinol 300 MG tablet Commonly known as: ZYLOPRIM Take 150 mg by mouth every morning.   B-D INS SYRINGE 0.5CC/30GX1/2" 30G X 1/2" 0.5 ML Misc Generic drug: Insulin Syringe-Needle U-100 USE THREE TIMES A DAY DX-E11.65 INJECTION   blood glucose meter kit and supplies Kit Dispense based on patient and insurance preference. Use up to four times daily as directed. (FOR ICD-9 250.00, 250.01).   buPROPion 150 MG 12 hr tablet Commonly known as: WELLBUTRIN SR Take 150 mg by mouth every morning.   diltiazem 360 MG 24 hr capsule Commonly known as: CARDIZEM CD Take 1 capsule (360 mg total) by mouth daily.   donepezil 10 MG tablet Commonly known as: ARICEPT Take 1 at bedtime What changed:  how much to take how to take this when to take this additional instructions   doxycycline 100 MG tablet Commonly known as: VIBRA-TABS Take 1 tablet (100 mg total) by mouth 2 (two) times daily.   Eliquis 5 MG Tabs tablet Generic drug: apixaban TAKE 1 TABLET BY MOUTH TWICE A DAY   escitalopram 20 MG tablet Commonly known as: LEXAPRO Take 40 mg by mouth every morning.   furosemide 80 MG tablet Commonly  known as: LASIX Take 1 tablet (80 mg total) by mouth daily. T   indomethacin 50 MG capsule Commonly known as: INDOCIN Take 1 capsule (50 mg total) by mouth 3 (three) times daily as needed (for gout flare ups ). Reported on 12/27/2015   insulin aspart 100 UNIT/ML injection Commonly known as: novoLOG Inject 10-50 Units into the skin 3 (three) times daily as needed for high blood sugar. Sliding scale   Jardiance 25 MG Tabs tablet Generic drug: empagliflozin Take 25 mg by mouth daily.   Levemir 100 UNIT/ML injection Generic drug: insulin detemir Inject 10-50 Units into the skin at bedtime. Sliding scale   oxyCODONE-acetaminophen 5-325 MG  tablet Commonly known as: PERCOCET/ROXICET Take 1 tablet by mouth every 4 (four) hours as needed.   oxyCODONE-acetaminophen 5-325 MG tablet Commonly known as: PERCOCET/ROXICET Take 1 tablet by mouth every 4 (four) hours as needed.   oxymetazoline 0.05 % nasal spray Commonly known as: AFRIN Place 1 spray into both nostrils daily as needed for congestion.   pentoxifylline 400 MG CR tablet Commonly known as: TRENTAL TAKE 1 TABLET BY MOUTH 3 TIMES DAILY WITH MEALS. What changed: See the new instructions.   potassium chloride SA 20 MEQ tablet Commonly known as: Klor-Con M20 Take 1 tablet (20 mEq total) by mouth 2 (two) times daily.   simvastatin 20 MG tablet Commonly known as: ZOCOR Take 20 mg by mouth every evening.   tamsulosin 0.4 MG Caps capsule Commonly known as: FLOMAX Take 1 capsule (0.4 mg total) by mouth daily after breakfast. What changed: when to take this   Vitamin D 50 MCG (2000 UT) tablet Take 4,000 Units by mouth daily.               Discharge Care Instructions  (From admission, onward)           Start     Ordered   07/24/22 0000  Touch down weight bearing       Question Answer Comment  Laterality left   Extremity Lower      07/24/22 0715            Diagnostic Studies: No results found.  Patient benefited maximally from their hospital stay and there were no complications.     Disposition: Discharge disposition: 01-Home or Self Care      Discharge Instructions     Call MD / Call 911   Complete by: As directed    If you experience chest pain or shortness of breath, CALL 911 and be transported to the hospital emergency room.  If you develope a fever above 101 F, pus (white drainage) or increased drainage or redness at the wound, or calf pain, call your surgeon's office.   Call MD / Call 911   Complete by: As directed    If you experience chest pain or shortness of breath, CALL 911 and be transported to the hospital emergency room.   If you develope a fever above 101 F, pus (white drainage) or increased drainage or redness at the wound, or calf pain, call your surgeon's office.   Constipation Prevention   Complete by: As directed    Drink plenty of fluids.  Prune juice may be helpful.  You may use a stool softener, such as Colace (over the counter) 100 mg twice a day.  Use MiraLax (over the counter) for constipation as needed.   Constipation Prevention   Complete by: As directed    Drink plenty of fluids.  Prune  juice may be helpful.  You may use a stool softener, such as Colace (over the counter) 100 mg twice a day.  Use MiraLax (over the counter) for constipation as needed.   Diet - low sodium heart healthy   Complete by: As directed    Diet - low sodium heart healthy   Complete by: As directed    Increase activity slowly as tolerated   Complete by: As directed    Increase activity slowly as tolerated   Complete by: As directed    Negative Pressure Wound Therapy - Incisional   Complete by: As directed    Instruct patient how to plug in the wound VAC to keep it charged.   Post-operative opioid taper instructions:   Complete by: As directed    POST-OPERATIVE OPIOID TAPER INSTRUCTIONS: It is important to wean off of your opioid medication as soon as possible. If you do not need pain medication after your surgery it is ok to stop day one. Opioids include: Codeine, Hydrocodone(Norco, Vicodin), Oxycodone(Percocet, oxycontin) and hydromorphone amongst others.  Long term and even short term use of opiods can cause: Increased pain response Dependence Constipation Depression Respiratory depression And more.  Withdrawal symptoms can include Flu like symptoms Nausea, vomiting And more Techniques to manage these symptoms Hydrate well Eat regular healthy meals Stay active Use relaxation techniques(deep breathing, meditating, yoga) Do Not substitute Alcohol to help with tapering If you have been on opioids for less  than two weeks and do not have pain than it is ok to stop all together.  Plan to wean off of opioids This plan should start within one week post op of your joint replacement. Maintain the same interval or time between taking each dose and first decrease the dose.  Cut the total daily intake of opioids by one tablet each day Next start to increase the time between doses. The last dose that should be eliminated is the evening dose.      Post-operative opioid taper instructions:   Complete by: As directed    POST-OPERATIVE OPIOID TAPER INSTRUCTIONS: It is important to wean off of your opioid medication as soon as possible. If you do not need pain medication after your surgery it is ok to stop day one. Opioids include: Codeine, Hydrocodone(Norco, Vicodin), Oxycodone(Percocet, oxycontin) and hydromorphone amongst others.  Long term and even short term use of opiods can cause: Increased pain response Dependence Constipation Depression Respiratory depression And more.  Withdrawal symptoms can include Flu like symptoms Nausea, vomiting And more Techniques to manage these symptoms Hydrate well Eat regular healthy meals Stay active Use relaxation techniques(deep breathing, meditating, yoga) Do Not substitute Alcohol to help with tapering If you have been on opioids for less than two weeks and do not have pain than it is ok to stop all together.  Plan to wean off of opioids This plan should start within one week post op of your joint replacement. Maintain the same interval or time between taking each dose and first decrease the dose.  Cut the total daily intake of opioids by one tablet each day Next start to increase the time between doses. The last dose that should be eliminated is the evening dose.      Touch down weight bearing   Complete by: As directed    Laterality: left   Extremity: Lower       Follow-up Information     Newt Minion, MD Follow up in 1 week(s).    Specialty: Orthopedic  Surgery Contact information: [address] [place] [address] [phone]                  Signed: Newt Minion 07/29/2022, 7:34 AM

## 2022-07-29 NOTE — Progress Notes (Signed)
Patient ID: Oscar [name]., male   DOB: 01-15-1946, 76 y.o.   MRN: [id number] Patient states that he has a scooter at home and has 2 people at home that can care for him.  Patient states he is ready for discharge today.  Will have the wound VAC dressing removed and dry dressing applied with plan for discharge to home today.

## 2022-07-29 NOTE — Plan of Care (Signed)
  Problem: Education: Goal: Ability to describe self-care measures that may prevent or decrease complications (Diabetes Survival Skills Education) will improve Outcome: Progressing   Problem: Skin Integrity: Goal: Risk for impaired skin integrity will decrease Outcome: Progressing   Problem: Education: Goal: Knowledge of General Education information will improve Description: Including pain rating scale, medication(s)/side effects and non-pharmacologic comfort measures Outcome: Progressing   Problem: Activity: Goal: Risk for activity intolerance will decrease Outcome: Progressing   Problem: Pain Managment: Goal: General experience of comfort will improve Outcome: Progressing   Problem: Safety: Goal: Ability to remain free from injury will improve Outcome: Progressing   Problem: Skin Integrity: Goal: Risk for impaired skin integrity will decrease Outcome: Progressing

## 2022-07-29 NOTE — PMR Pre-admission (Signed)
PMR Admission Coordinator Pre-Admission Assessment  Patient: Oscar [name]. is an 76 y.o., male MRN: [id number] DOB: 05/22/1946 Height: 5' 9" (175.3 cm) Weight: 122.5 kg  Insurance Information HMO:     PPO:      PCP:      IPA:      80/20:      OTHER:  PRIMARY: Medicare a and b      Policy#: [id number]      Subscriber: pt Benefits:  Phone #: passport one source online     Name:  Oscar [name]. Date: a 02/08/10 and b 12/10/14     Deduct: $1600      Out of Pocket Max: none CIR: 100%      SNF: 20 full days Outpatient: 80%     Co-Pay: 20% Home Health: 100%      Co-Pay: none DME: 80%     Co-Pay: 20% Providers: pt choice  SECONDARY: [employer]      Policy#: [id number]  Financial Counselor:       Phone#:   The "Data Collection Information Summary" for patients in Inpatient Rehabilitation Facilities with attached "Privacy Act [place] Records" was provided and verbally reviewed with: Patient and Family  Emergency Contact Information Contact Information     Name Relation Home Work Mobile   Oscar [name],Oscar [name] Daughter   [phone]   Oscar [name] [phone]     Oscar [name], Oscar [name] [phone]        Current Medical History  Patient Admitting Diagnosis: Transmetatarsal amputation  History of Present Illness: 76 year old male with past medical history of afib, arthritis, CKD, type 2 DM, GERD, Gout, HTN, OSA and CVA. Recent history of wagner grade 1 ulcer beneath second metatarsal head on left foot. Also wound on second toe dorsum of IP joint. Placed on doxycycline for 2 weeks . Recent vascular surgery for ABI'S R 1.09 and L 1.63. Presented on 07/23/22 with fever, chills and worsening of ulcer.  Workup revealed osteomyelitis left foot. OR 07/23/22 and underwent left transmetatarsal amputation. Postoperative pain control and mobility issues. Recent history of torn mensicus right LE which has limited mobility progression. ASA for DVT prophylaxis.   Patient's medical record  from [facility] has been reviewed by the rehabilitation admission coordinator and physician.  Past Medical History  Past Medical History:  Diagnosis Date   Anal fissure    h/o - no recent complications   Arthritis    hips   Atrial fibrillation (HCC)    CKD (chronic kidney disease)    Depression    Diabetes mellitus without complication (HCC)    Type II   Enlarged prostate    GERD (gastroesophageal reflux disease)    Gout    H/O hiatal hernia    Hard of hearing    History of kidney stones    Hx of typhoid fever    as a child   Hypertension    improved after diet/exercise   Memory change 04/14/2017   OSA (obstructive sleep apnea)    wears cpap   Pneumonia    PONV (postoperative nausea and vomiting)    also difficulty waking up   Stroke ([place]) 08/2013   short term memory loss (slight)   Has the patient had major surgery during 100 days prior to admission? Yes  Family History   family history includes COPD in his father; Heart attack in his sister.  Current Medications  Current Facility-Administered Medications:    0.9 %  sodium chloride infusion, ,  Intravenous, Continuous, Newt Minion, MD, Stopped at 07/24/22 0830   acetaminophen (TYLENOL) tablet 325-650 mg, 325-650 mg, Oral, Q6H PRN, Newt Minion, MD, 650 mg at 07/28/22 2358   apixaban (ELIQUIS) tablet 5 mg, 5 mg, Oral, BID, Newt Minion, MD, 5 mg at 07/29/22 0857   bisacodyl (DULCOLAX) suppository 10 mg, 10 mg, Rectal, Daily PRN, Newt Minion, MD   buPROPion [facility] SR) 12 hr tablet 150 mg, 150 mg, Oral, BH-q7a, Newt Minion, MD, 150 mg at 07/29/22 9977   diclofenac Sodium (VOLTAREN) 1 % topical gel 2 g, 2 g, Topical, QID, Newt Minion, MD, 2 g at 07/29/22 1230   diltiazem (CARDIZEM CD) 24 hr capsule 360 mg, 360 mg, Oral, Daily, Newt Minion, MD, 360 mg at 07/29/22 0857   docusate sodium (COLACE) capsule 100 mg, 100 mg, Oral, BID, Newt Minion, MD, 100 mg at 07/29/22 0857   donepezil  (ARICEPT) tablet 10 mg, 10 mg, Oral, QPM, Newt Minion, MD, 10 mg at 07/28/22 1841   empagliflozin (JARDIANCE) tablet 25 mg, 25 mg, Oral, Daily, Newt Minion, MD, 25 mg at 07/29/22 0857   escitalopram (LEXAPRO) tablet 40 mg, 40 mg, Oral, BH-q7a, Newt Minion, MD, 40 mg at 07/29/22 0636   furosemide (LASIX) tablet 80 mg, 80 mg, Oral, Daily, Newt Minion, MD, 80 mg at 07/29/22 0857   HYDROmorphone (DILAUDID) injection 0.5-1 mg, 0.5-1 mg, Intravenous, Q4H PRN, Newt Minion, MD   insulin aspart (novoLOG) injection 0-15 Units, 0-15 Units, Subcutaneous, TID WC, Newt Minion, MD, 3 Units at 07/29/22 1229   insulin aspart (novoLOG) injection 4 Units, 4 Units, Subcutaneous, TID WC, Newt Minion, MD, 4 Units at 07/29/22 1229   insulin detemir (LEVEMIR) injection 20 Units, 20 Units, Subcutaneous, QHS, Newt Minion, MD, 20 Units at 07/28/22 2108   magnesium citrate solution 1 Bottle, 1 Bottle, Oral, Once PRN, Newt Minion, MD   methocarbamol (ROBAXIN) tablet 500 mg, 500 mg, Oral, Q6H PRN, 500 mg at 07/29/22 0635 **OR** methocarbamol (ROBAXIN) 500 mg in dextrose 5 % 50 mL IVPB, 500 mg, Intravenous, Q6H PRN, Newt Minion, MD   metoCLOPramide (REGLAN) tablet 5-10 mg, 5-10 mg, Oral, Q8H PRN **OR** metoCLOPramide (REGLAN) injection 5-10 mg, 5-10 mg, Intravenous, Q8H PRN, Newt Minion, MD   ondansetron (ZOFRAN) tablet 4 mg, 4 mg, Oral, Q6H PRN **OR** ondansetron (ZOFRAN) injection 4 mg, 4 mg, Intravenous, Q6H PRN, Newt Minion, MD   oxyCODONE (Oxy IR/ROXICODONE) immediate release tablet 10-15 mg, 10-15 mg, Oral, Q4H PRN, Newt Minion, MD, 10 mg at 07/24/22 2153   oxyCODONE (Oxy IR/ROXICODONE) immediate release tablet 5-10 mg, 5-10 mg, Oral, Q4H PRN, Newt Minion, MD, 5 mg at 07/29/22 0124   polyethylene glycol (MIRALAX / GLYCOLAX) packet 17 g, 17 g, Oral, Daily PRN, Newt Minion, MD   potassium chloride SA (KLOR-CON M) CR tablet 20 mEq, 20 mEq, Oral, BID, Newt Minion, MD, 20 mEq at  07/29/22 0857   simvastatin (ZOCOR) tablet 20 mg, 20 mg, Oral, QPM, Newt Minion, MD, 20 mg at 07/28/22 1841   tamsulosin (FLOMAX) capsule 0.4 mg, 0.4 mg, Oral, BID, Newt Minion, MD, 0.4 mg at 07/29/22 0857  Patients Current Diet:  Diet Order             Diet - low sodium heart healthy           Diet - low sodium heart  healthy           Diet Carb Modified Fluid consistency: Thin; Room service appropriate? Yes  Diet effective now                  Precautions / Restrictions Precautions Precautions: Fall Precaution Comments: Left transmetatarsal amputation, wound vac L foot Other Brace: post op shoe Restrictions Weight Bearing Restrictions: Yes LLE Weight Bearing: Touchdown weight bearing Other Position/Activity Restrictions: wound vac   Has the patient had 2 or more falls or a fall with injury in the past year? No  Prior Activity Level Limited Community (1-2x/wk): Independent at baseline  Prior Functional Level Self Care: Did the patient need help bathing, dressing, using the toilet or eating? Independent  Indoor Mobility: Did the patient need assistance with walking from room to room (with or without device)? Independent  Stairs: Did the patient need assistance with internal or external stairs (with or without device)? Independent  Functional Cognition: Did the patient need help planning regular tasks such as shopping or remembering to take medications? Independent  Patient Information Are you of Hispanic, Latino/a,or Spanish origin?: A. No, not of Hispanic, Latino/a, or Spanish origin What is your race?: A. White Do you need or want an interpreter to communicate with a doctor or health care staff?: 0. No  Patient's Response To:  Health Literacy and Transportation Is the patient able to respond to health literacy and transportation needs?: Yes Health Literacy - How often do you need to have someone help you when you read instructions, pamphlets, or other  written material from your doctor or pharmacy?: Never In the past 12 months, has lack of transportation kept you from medical appointments or from getting medications?: No In the past 12 months, has lack of transportation kept you from meetings, work, or from getting things needed for daily living?: No  Home Assistive Devices / [place] Devices/Equipment: Radio producer (specify quad or straight), Walker (specify type) Home Equipment: Conservation officer, nature (2 wheels) (knee scooter)  Prior Device Use: Indicate devices/aids used by the patient prior to current illness, exacerbation or injury? Recent use of knee walker or RW  Current Functional Level Cognition  Overall Cognitive Status: Impaired/Different from baseline Orientation Level: Oriented X4 Following Commands: Follows one step commands with increased time, Follows one step commands inconsistently Safety/Judgement: Decreased awareness of safety, Decreased awareness of deficits General Comments: cues for WB, minor cues for safety/sequencing and problem solving safe transfers, but pleasant and eager to participate.    Extremity Assessment (includes Sensation/Coordination)  Upper Extremity Assessment: Overall WFL for tasks assessed  Lower Extremity Assessment: Defer to PT evaluation RLE Deficits / Details: After trying to get up many times unsuccessfully and lying back down, pt mentions that he has R knee OA and pain which makes is difficult to put fillwt on that LE. Hip flex 3-/5. knee ext 3/4 RLE Sensation: decreased proprioception RLE Coordination: decreased gross motor LLE Deficits / Details: hip flex 2+/5, knee ext 2+/5 LLE Sensation: decreased proprioception LLE Coordination: decreased gross motor    ADLs  Overall ADL's : Needs assistance/impaired Eating/Feeding: Independent, Sitting Grooming: Wash/dry hands, Wash/dry face, Oral care, Applying deodorant, Set up, Sitting Upper Body Bathing: Set up, Sitting Lower Body Bathing:  Total assistance, Bed level, Sitting/lateral leans Upper Body Dressing : Set up, Sitting Lower Body Dressing: Total assistance, Sitting/lateral leans, Bed level Toilet Transfer: Maximal assistance, +2 for physical assistance, +2 for safety/equipment Toilet Transfer Details (indicate cue type and reason): lateral scoot simulated  to drop arm recliner towards right side Toileting- Clothing Manipulation and Hygiene: Total assistance, Sitting/lateral lean, Bed level General ADL Comments: Focus on transfer progression, education on barriers to safe DC home and equipment needs. Educated on drop arm bari BSC need to allow adequate room for hygiene and clothing mgmt, how ADL assist would likely be bed level if only +1 assist at home    Mobility  Overal bed mobility: Needs Assistance Bed Mobility: Supine to Sit Rolling: Supervision Sidelying to sit: Min assist Supine to sit: Supervision, HOB elevated General bed mobility comments: heavy use of bedrails though good effort on pt part    Transfers  Overall transfer level: Needs assistance Equipment used: Sliding board Transfers: Bed to chair/wheelchair/BSC Sit to Stand: +2 physical assistance, Mod assist Bed to/from chair/wheelchair/BSC transfer type:: Lateral/scoot transfer  Lateral/Scoot Transfers: Min assist, +2 safety/equipment, With slide board General transfer comment: Pt initially declined sliding board and opted for scooting to chair though difficult to manage safely and agreed for sliding board placement. Min A x 2 w/ chair blocking to prevent sliding and light assist to scoot up onto board w/ cues to avoid WB through LLE.    Ambulation / Gait / Stairs / Wheelchair Mobility  Ambulation/Gait General Gait Details: unable    Posture / Balance Dynamic Sitting Balance Sitting balance - Comments: seated EOB Balance Overall balance assessment: Needs assistance Sitting-balance support: Feet supported Sitting balance-Leahy Scale: Fair Sitting  balance - Comments: seated EOB Postural control: Right lateral lean Standing balance support: Bilateral upper extremity supported, During functional activity, Reliant on assistive device for balance Standing balance-Leahy Scale: Poor Standing balance comment: heavy reliance on RW    Special needs/care consideration CPAP at HS Fall precautions   Previous Home Environment  Living Arrangements:  (son lives with patient)  Lives With: Son Available Help at Discharge: Family, Available 24 hours/day (to likely go stay at daughter's home at discharge) Type of Home: House Home Layout: Two level, Able to live on main level with bedroom/bathroom Home Access: Stairs to enter Entrance Stairs-Rails: Right Entrance Stairs-Number of Steps: 4 Bathroom Shower/Tub: Tub/shower unit Home Care Services: No Additional Comments: Daughter and Son-in-law are RN's.  Discharge Living Setting Plans for Discharge Living Setting: Patient's home, House, Lives with (comment) (son lives with patient) Type of Home at Discharge: House Discharge Home Layout: Two level, Able to live on main level with bedroom/bathroom Discharge Home Access: Stairs to enter Entrance Stairs-Rails: Right Entrance Stairs-Number of Steps: 4 Discharge Bathroom Shower/Tub: Tub/shower unit Discharge Bathroom Toilet: Handicapped height Discharge Bathroom Accessibility: Yes How Accessible: Accessible via walker Does the patient have any problems obtaining your medications?: No  Social/Family/Support Systems Patient Roles: Parent Contact Information: daughter, Oscar [name] Anticipated Caregiver: daughter and son in law Anticipated Caregiver's Contact Information: see contacts Ability/Limitations of Caregiver: dtr and SIL are working nurses, will alternate schedule to provide 24/7 assist Caregiver Availability: 24/7 Discharge Plan Discussed with Primary Caregiver: Yes Is Caregiver In Agreement with Plan?: Yes Does Caregiver/Family have Issues  with Lodging/Transportation while Pt is in Rehab?: No  Goals Patient/Family Goal for Rehab: supervision to min assist with PT and OT at wheelchair level Expected length of stay: ELOS 5 to 7 days or sooner; wants to be home for Christmas Additional Information: Will either return to his home or go stay at Daughter's home Pt/Family Agrees to Admission and willing to participate: Yes Program Orientation Provided & Reviewed with Pt/Caregiver Including Roles  & Responsibilities: Yes  Decrease burden of Care through  IP rehab admission: n/a  Possible need for SNF placement upon discharge: not anticipated  Patient Condition: I have reviewed medical records from [facility], spoken with patient, daughter, and family member. I met with patient at the bedside for inpatient rehabilitation assessment.  Patient will benefit from ongoing PT and OT, can actively participate in 3 hours of therapy a day 5 days of the week, and can make measurable gains during the admission.  Patient will also benefit from the coordinated team approach during an Inpatient Acute Rehabilitation admission.  The patient will receive intensive therapy as well as Rehabilitation physician, nursing, social worker, and care management interventions.  Due to bladder management, bowel management, safety, skin/wound care, disease management, medication administration, pain management, and patient education the patient requires 24 hour a day rehabilitation nursing.  The patient is currently min assist slide board transfers with mobility and basic ADLs.  Discharge setting and therapy post discharge at home with home health is anticipated.  Patient has agreed to participate in the Acute Inpatient Rehabilitation Program and will admit today.  Preadmission Screen Completed By:  Cleatrice Burke, 07/29/2022 12:51 PM ______________________________________________________________________   Discussed status with Dr. Naaman Plummer on 07/29/22 at  1258 and received approval for admission today.  Admission Coordinator:  Cleatrice Burke, RN, time 3825 Date 07/29/22   Assessment/Plan: Diagnosis:osteomyelitis-->left TMA Does the need for close, 24 hr/day Medical supervision in concert with the patient's rehab needs make it unreasonable for this patient to be served in a less intensive setting? Yes Co-Morbidities requiring supervision/potential complications: gout, dm2, htn, osa, ckd Due to bladder management, bowel management, safety, skin/wound care, disease management, medication administration, pain management, and patient education, does the patient require 24 hr/day rehab nursing? Yes Does the patient require coordinated care of a physician, rehab nurse, PT, OT to address physical and functional deficits in the context of the above medical diagnosis(es)? Yes Addressing deficits in the following areas: balance, endurance, locomotion, strength, transferring, bowel/bladder control, bathing, dressing, feeding, grooming, toileting, and psychosocial support Can the patient actively participate in an intensive therapy program of at least 3 hrs of therapy 5 days a week? Yes The potential for patient to make measurable gains while on inpatient rehab is excellent Anticipated functional outcomes upon discharge from inpatient rehab: supervision and min assist PT, supervision and min assist OT, n/a SLP Estimated rehab length of stay to reach the above functional goals is: 5-7 days Anticipated discharge destination: Home 10. Overall Rehab/Functional Prognosis: excellent   MD Signature: Meredith Staggers, MD, [place] Director Rehabilitation Services 07/29/2022

## 2022-07-29 NOTE — Progress Notes (Cosign Needed)
    Durable Medical Equipment  (From admission, onward)           Start     Ordered   07/29/22 0829  For home use only DME 3 n 1  Once       Comments: Confined to one room   07/29/22 0828   07/29/22 0828  For home use only DME Walker rolling  Once       Question Answer Comment  Walker: With [place] Whe[employer]   Patient needs a walker to treat with the following condition Gait abnormality      07/29/22 0828

## 2022-07-29 NOTE — Progress Notes (Signed)
Pt had increased pain per shift

## 2022-07-29 NOTE — H&P (Signed)
Physical Medicine and Rehabilitation Admission H&P    CC: Debility secondary to left foot osteomyelitis s/p TMA  HPI: Oscar [name] is a 76 year old diabetic male with a history of a Wagner grade 1 ulcer beneath the second metatarsal head of the left foot.  He also had a wound on the second toe dorsum of the inner phalangeal joint.  He had been on doxycycline for the last 2 weeks secondary to concern for infection.  He failed conservative measures and elected for surgical management.  He underwent left transmetatarsal amputation by Dr. Sharol Given on 07/23/2022.  His medical history is also significant for chronic diastolic heart failure followed by Dr. Haroldine Laws, atrial fibrillation on Eliquis, dementia on Aricept, chronic kidney disease, obstructive sleep apnea, or CVA.  He remained hemodynamically stable postoperatively.  Diabetic coordinator was consulted for management of diabetes.  Incisional negative pressure vacuum dressing to continue for one week. Touchdown weightbearing left lower extremity. He is tolerating diabetic diet. The patient requires inpatient physical medicine and rehabilitation evaluations and treatment secondary to dysfunction due to left transmetatarsal amputation.  Lives with son but will go home to daughter's home.  Review of Systems  Constitutional:  Negative for chills and fever.  HENT:  Negative for congestion, hearing loss and sore throat.   Eyes:  Positive for blurred vision. Negative for double vision.       Chronic blurry vision from diabetic retinopathy  Respiratory:  Negative for cough and shortness of breath.   Cardiovascular:  Negative for chest pain and palpitations.  Gastrointestinal:  Negative for nausea and vomiting.  Genitourinary:  Negative for dysuria and urgency.  Musculoskeletal:  Positive for joint pain. Negative for falls.       Right knee with torn meniscus, right elbow tendinitis  Skin:  Negative for rash.  Neurological:  Negative for dizziness  and headaches.  Psychiatric/Behavioral:  Negative for depression. The patient does not have insomnia.    Past Medical History:  Diagnosis Date   Anal fissure    h/o - no recent complications   Arthritis    hips   Atrial fibrillation (HCC)    CKD (chronic kidney disease)    Depression    Diabetes mellitus without complication (HCC)    Type II   Enlarged prostate    GERD (gastroesophageal reflux disease)    Gout    H/O hiatal hernia    Hard of hearing    History of kidney stones    Hx of typhoid fever    as a child   Hypertension    improved after diet/exercise   Memory change 04/14/2017   OSA (obstructive sleep apnea)    wears cpap   Pneumonia    PONV (postoperative nausea and vomiting)    also difficulty waking up   Stroke ([place]) 08/2013   short term memory loss (slight)   Past Surgical History:  Procedure Laterality Date   AMPUTATION Left 10/02/2017   Procedure: LEFT FOOT 1ST RAY AMPUTATION;  Surgeon: Newt Minion, MD;  Location: [place];  Service: Orthopedics;  Laterality: Left;   AMPUTATION Left 01/15/2018   Procedure: LEFT FOOT 2ND TOE AMPUTATION;  Surgeon: Newt Minion, MD;  Location: [facility];  Service: Orthopedics;  Laterality: Left;   AMPUTATION Left 07/23/2022   Procedure: LEFT TRANSMETATARSAL AMPUTATION;  Surgeon: Newt Minion, MD;  Location: [place];  Service: Orthopedics;  Laterality: Left;   CARPAL TUNNEL RELEASE Left 12/26/2021   Procedure: CARPAL TUNNEL RELEASE;  Surgeon: Fredna Dow,  Lennette Bihari, MD;  Location: [facility];  Service: Orthopedics;  Laterality: Left;   carpel tunnel Bilateral    CATARACT EXTRACTION W/ INTRAOCULAR LENS  IMPLANT, BILATERAL     CHOLECYSTECTOMY     COLONOSCOPY     EYE SURGERY     laser surgery    I & D EXTREMITY Left 09/15/2014   Procedure: Excision Necrotic Left Achilles, Skin Graft, apply Wound VAC;  Surgeon: Newt Minion, MD;  Location: [place];  Service: Orthopedics;  Laterality: Left;   KIDNEY STONE SURGERY     KNEE ARTHROSCOPY Bilateral     LIGAMENT REPAIR     TONSILLECTOMY     VASECTOMY     Family History  Problem Relation Age of Onset   COPD Father    Heart attack Sister        died age 47 of MI   Social History:  reports that he has never smoked. He has never used smokeless tobacco. He reports current alcohol use. He reports that he does not use drugs. Allergies:  Allergies  Allergen Reactions   Iohexol Hives and Other (See Comments)     Desc: HIVES S/P 13HR.PREMEDS, ?LOW DOSAGE PREMEDS    Ivp Dye [Iodinated Contrast Media] Hives and Other (See Comments)    welps    Sulfamethoxazole-Trimethoprim Other (See Comments)    Gi upset   Medications Prior to Admission  Medication Sig Dispense Refill   acetaminophen (TYLENOL) 500 MG tablet Take 1,000 mg by mouth 2 (two) times daily as needed for moderate pain, headache, fever or mild pain.     allopurinol (ZYLOPRIM) 300 MG tablet Take 150 mg by mouth every morning.  11   buPROPion (WELLBUTRIN SR) 150 MG 12 hr tablet Take 150 mg by mouth every morning.  5   Cholecalciferol (VITAMIN D) 50 MCG (2000 UT) tablet Take 4,000 Units by mouth daily.     diltiazem (CARDIZEM CD) 360 MG 24 hr capsule Take 1 capsule (360 mg total) by mouth daily. 90 capsule 0   donepezil (ARICEPT) 10 MG tablet Take 1 at bedtime (Patient taking differently: Take 10 mg by mouth every evening.) 90 tablet 4   doxycycline (VIBRA-TABS) 100 MG tablet Take 1 tablet (100 mg total) by mouth 2 (two) times daily. 30 tablet 0   ELIQUIS 5 MG TABS tablet TAKE 1 TABLET BY MOUTH TWICE A DAY 180 tablet 1   escitalopram (LEXAPRO) 20 MG tablet Take 40 mg by mouth every morning.  3   furosemide (LASIX) 80 MG tablet Take 1 tablet (80 mg total) by mouth daily. T 90 tablet 3   insulin aspart (NOVOLOG) 100 UNIT/ML injection Inject 10-50 Units into the skin 3 (three) times daily as needed for high blood sugar. Sliding scale     JARDIANCE 25 MG TABS tablet Take 25 mg by mouth daily.     LEVEMIR 100 UNIT/ML injection Inject 10-50  Units into the skin at bedtime. Sliding scale  5   oxymetazoline (AFRIN) 0.05 % nasal spray Place 1 spray into both nostrils daily as needed for congestion.     pentoxifylline (TRENTAL) 400 MG CR tablet TAKE 1 TABLET BY MOUTH 3 TIMES DAILY WITH MEALS. (Patient taking differently: Take 400 mg by mouth in the morning and at bedtime.) 270 tablet 1   potassium chloride SA (KLOR-CON M20) 20 MEQ tablet Take 1 tablet (20 mEq total) by mouth 2 (two) times daily. 180 tablet 1   simvastatin (ZOCOR) 20 MG tablet Take 20 mg  by mouth every evening.  3   tamsulosin (FLOMAX) 0.4 MG CAPS capsule Take 1 capsule (0.4 mg total) by mouth daily after breakfast. (Patient taking differently: Take 0.4 mg by mouth 2 (two) times daily.) 30 capsule 0   B-D INS SYRINGE 0.5CC/30GX1/2" 30G X 1/2" 0.5 ML MISC USE THREE TIMES A DAY [id number] INJECTION  2   blood glucose meter kit and supplies KIT Dispense based on patient and insurance preference. Use up to four times daily as directed. (FOR ICD-9 250.00, 250.01). 1 each 0   indomethacin (INDOCIN) 50 MG capsule Take 1 capsule (50 mg total) by mouth 3 (three) times daily as needed (for gout flare ups ). Reported on 12/27/2015 90 capsule 1      Home: Home Living Family/patient expects to be discharged to:: Private residence Living Arrangements:  (son lives with patient) Available Help at Discharge: Family, Available 24 hours/day (to likely go stay at daughter's home at discharge) Type of Home: House Home Access: Stairs to enter [employer] of Steps: 4 Entrance Stairs-Rails: Right Home Layout: Two level, Able to live on main level with bedroom/bathroom Bathroom Shower/Tub: Tub/shower unit Home Equipment: Conservation officer, nature (2 wheels) (knee scooter) Additional Comments: Daughter and Son-in-law are RN's.  Lives With: Son   Functional History: Prior Function Prior Level of Function : Independent/Modified Independent (pt report) Mobility Comments: pt reports that he  was independent but question accuracy of this  Functional Status:  Mobility: Bed Mobility Overal bed mobility: Needs Assistance Bed Mobility: Supine to Sit Rolling: Supervision Sidelying to sit: Min assist Supine to sit: Supervision, HOB elevated General bed mobility comments: heavy use of bedrails though good effort on pt part Transfers Overall transfer level: Needs assistance Equipment used: Sliding board Transfers: Bed to chair/wheelchair/BSC Sit to Stand: +2 physical assistance, Mod assist Bed to/from chair/wheelchair/BSC transfer type:: Lateral/scoot transfer  Lateral/Scoot Transfers: Min assist, +2 safety/equipment, With slide board General transfer comment: Pt initially declined sliding board and opted for scooting to chair though difficult to manage safely and agreed for sliding board placement. Min A x 2 w/ chair blocking to prevent sliding and light assist to scoot up onto board w/ cues to avoid WB through LLE. Ambulation/Gait General Gait Details: unable    ADL: ADL Overall ADL's : Needs assistance/impaired Eating/Feeding: Independent, Sitting Grooming: Wash/dry hands, Wash/dry face, Oral care, Applying deodorant, Set up, Sitting Upper Body Bathing: Set up, Sitting Lower Body Bathing: Total assistance, Bed level, Sitting/lateral leans Upper Body Dressing : Set up, Sitting Lower Body Dressing: Total assistance, Sitting/lateral leans, Bed level Toilet Transfer: Maximal assistance, +2 for physical assistance, +2 for safety/equipment Toilet Transfer Details (indicate cue type and reason): lateral scoot simulated to drop arm recliner towards right side Toileting- Clothing Manipulation and Hygiene: Total assistance, Sitting/lateral lean, Bed level General ADL Comments: Focus on transfer progression, education on barriers to safe DC home and equipment needs. Educated on drop arm bari BSC need to allow adequate room for hygiene and clothing mgmt, how ADL assist would likely be  bed level if only +1 assist at home  Cognition: Cognition Overall Cognitive Status: Impaired/Different from baseline Orientation Level: Oriented X4 Cognition Arousal/Alertness: Awake/alert Behavior During Therapy: WFL for tasks assessed/performed Overall Cognitive Status: Impaired/Different from baseline Area of Impairment: Safety/judgement, Awareness Memory: Decreased short-term memory, Decreased recall of precautions Following Commands: Follows one step commands with increased time, Follows one step commands inconsistently Safety/Judgement: Decreased awareness of safety, Decreased awareness of deficits Awareness: Emergent Problem Solving: Slow processing, Requires verbal  cues, Decreased initiation General Comments: cues for WB, minor cues for safety/sequencing and problem solving safe transfers, but pleasant and eager to participate.  Physical Exam: Blood pressure 112/67, pulse 84, temperature 98 F (36.7 C), resp. rate 18, height _0  (1.753 m), weight 122.5 kg, SpO2 97 %. Physical Exam Constitutional:      General: He is not in acute distress.    Appearance: He is obese.  HENT:     Head: Normocephalic and atraumatic.     Right Ear: External ear normal.     Left Ear: External ear normal.     Nose: Nose normal.     Mouth/Throat:     Mouth: Mucous membranes are moist.     Pharynx: Oropharynx is clear.  Eyes:     General: No scleral icterus.    Extraocular Movements: Extraocular movements intact.     Conjunctiva/sclera: Conjunctivae normal.     Pupils: Pupils are equal, round, and reactive to light.  Cardiovascular:     Rate and Rhythm: Normal rate and regular rhythm.     Heart sounds: No murmur heard.    No gallop.  Pulmonary:     Effort: Pulmonary effort is normal. No respiratory distress.     Breath sounds: Normal breath sounds. No wheezing.  Abdominal:     General: Bowel sounds are normal. There is no distension.     Palpations: Abdomen is soft.     Tenderness:  There is no abdominal tenderness.  Musculoskeletal:        General: No swelling.     Cervical back: Normal range of motion.  Skin:    General: Skin is warm and dry.     Comments: Left foot with vac in place, sealed. Right foot intact.   Neurological:     Mental Status: He is alert.     Comments: Alert and oriented x 3. Normal insight and awareness. Intact Memory. Normal language and speech. Cranial nerve exam unremarkable. Motor 5/5 UE, 4-5/5 RLE, LLE 3-4/5 where testable due to vac. Decreased LT right foot, left foot wrapped. DTR's 1+  Psychiatric:        Mood and Affect: Mood normal.        Behavior: Behavior normal.     Results for orders placed or performed during the hospital encounter of 07/23/22 (from the past 48 hour(s))  Glucose, capillary     Status: Abnormal   Collection Time: 07/27/22  5:01 PM  Result Value Ref Range   Glucose-Capillary 210 (H) 70 - 99 mg/dL    Comment: Glucose reference range applies only to samples taken after fasting for at least 8 hours.  Glucose, capillary     Status: Abnormal   Collection Time: 07/27/22  8:26 PM  Result Value Ref Range   Glucose-Capillary 201 (H) 70 - 99 mg/dL    Comment: Glucose reference range applies only to samples taken after fasting for at least 8 hours.  Glucose, capillary     Status: Abnormal   Collection Time: 07/28/22  8:11 AM  Result Value Ref Range   Glucose-Capillary 152 (H) 70 - 99 mg/dL    Comment: Glucose reference range applies only to samples taken after fasting for at least 8 hours.  Glucose, capillary     Status: Abnormal   Collection Time: 07/28/22 11:37 AM  Result Value Ref Range   Glucose-Capillary 166 (H) 70 - 99 mg/dL    Comment: Glucose reference range applies only to samples taken after fasting for at  least 8 hours.  Glucose, capillary     Status: Abnormal   Collection Time: 07/28/22  4:11 PM  Result Value Ref Range   Glucose-Capillary 196 (H) 70 - 99 mg/dL    Comment: Glucose reference range  applies only to samples taken after fasting for at least 8 hours.  Glucose, capillary     Status: Abnormal   Collection Time: 07/28/22  7:38 PM  Result Value Ref Range   Glucose-Capillary 218 (H) 70 - 99 mg/dL    Comment: Glucose reference range applies only to samples taken after fasting for at least 8 hours.  Glucose, capillary     Status: Abnormal   Collection Time: 07/29/22  8:08 AM  Result Value Ref Range   Glucose-Capillary 187 (H) 70 - 99 mg/dL    Comment: Glucose reference range applies only to samples taken after fasting for at least 8 hours.  Glucose, capillary     Status: Abnormal   Collection Time: 07/29/22 11:32 AM  Result Value Ref Range   Glucose-Capillary 197 (H) 70 - 99 mg/dL    Comment: Glucose reference range applies only to samples taken after fasting for at least 8 hours.   No results found.    Blood pressure 112/67, pulse 84, temperature 98 F (36.7 C), resp. rate 18, height _0  (1.753 m), weight 122.5 kg, SpO2 97 %.  Medical Problem List and Plan: 1. Functional deficits secondary to debility secondary to left foot osteomyelitis s/p left TMA  -patient may shower if foot/vac kept dry  -ELOS/Goals: 5-7 days, supervision to min assist with mobility and self-care 2.  Antithrombotics: -DVT/anticoagulation:  Pharmaceutical: Eliquis  -antiplatelet therapy: none   3. Pain Management: Tylenol, Robaxin, oxycodone as needed  -Voltaren prn right knee and elbow pain 4. Mood/Behavior/Sleep: LCSW to evaluate and provide emotional support  -antipsychotic agents: n/a  -continue Wellbutrin SR 150 mg q AM, Lexapro 40 mg q AM  -melatonin prn for sleep 5. Neuropsych/cognition: This patient is capable of making decisions on his own behalf.  6. Skin/Wound Care: routine skin care checks  -4 x 4 gauze pads and ACE wrap; change daily  7. Fluids/Electrolytes/Nutrition: strict Is and Os and follow-up chemistries  -carb modified diet  8: Left TMA: vac. Transition to dry  dressing today as above  9: DM: CBGs q AC and q HS; A1c= 7.5  -continue SSI  -Levemir 20 units q HS  -Novolog increase to 6 units TID with meals; hold if eats less than 50% of meals  -Jardiance 25 mg daily  10: Chronic diastolic CHF: daily weight  -continue diltiazem 360 mg daily  -continue Lasix 80 mg daily  11: Atrial fibrillation:  -continue Eliquis  12: Hypertension:   -continue diltiazem 360 mg daily -continue Lasix 80 mg daily  13: OSA: wears CPAP at night but inconsistently  14: CKD: baseline creatinine 2.1-2.3; follow-up BMP  15: Prior CVA with cognitive impairment: continue Aricept 10 mg q evening  16: Hypokalemia: continue supplementation and follow-up BMP  17: Hyperlipidemia: continue simvastatin  18: Urinary hesitancy/frequency: continue Flomax 0.4 mg daily  -check PVRs  -pt has had some flow issues post-operatively 19: Gout, history of: resume allopurinol 300 mg q AM        Barbie Banner, PA-C 07/29/2022

## 2022-07-30 DIAGNOSIS — I5032 Chronic diastolic (congestive) heart failure: Secondary | ICD-10-CM

## 2022-07-30 DIAGNOSIS — E1169 Type 2 diabetes mellitus with other specified complication: Secondary | ICD-10-CM

## 2022-07-30 DIAGNOSIS — I10 Essential (primary) hypertension: Secondary | ICD-10-CM | POA: Diagnosis not present

## 2022-07-30 DIAGNOSIS — E669 Obesity, unspecified: Secondary | ICD-10-CM

## 2022-07-30 DIAGNOSIS — M869 Osteomyelitis, unspecified: Secondary | ICD-10-CM | POA: Diagnosis not present

## 2022-07-30 LAB — CBC WITH DIFFERENTIAL/PLATELET
Abs Immature Granulocytes: 0.25 10*3/uL — ABNORMAL HIGH (ref 0.00–0.07)
Basophils Absolute: 0.1 10*3/uL (ref 0.0–0.1)
Basophils Relative: 1 %
Eosinophils Absolute: 0.2 10*3/uL (ref 0.0–0.5)
Eosinophils Relative: 3 %
HCT: 36.6 % — ABNORMAL LOW (ref 39.0–52.0)
Hemoglobin: 11.5 g/dL — ABNORMAL LOW (ref 13.0–17.0)
Immature Granulocytes: 3 %
Lymphocytes Relative: 10 %
Lymphs Abs: 0.8 10*3/uL (ref 0.7–4.0)
MCH: 30.5 pg (ref 26.0–34.0)
MCHC: 31.4 g/dL (ref 30.0–36.0)
MCV: 97.1 fL (ref 80.0–100.0)
Monocytes Absolute: 1.1 10*3/uL — ABNORMAL HIGH (ref 0.1–1.0)
Monocytes Relative: 13 %
Neutro Abs: 5.9 10*3/uL (ref 1.7–7.7)
Neutrophils Relative %: 70 %
Platelets: 278 10*3/uL (ref 150–400)
RBC: 3.77 MIL/uL — ABNORMAL LOW (ref 4.22–5.81)
RDW: 15 % (ref 11.5–15.5)
WBC: 8.3 10*3/uL (ref 4.0–10.5)
nRBC: 0 % (ref 0.0–0.2)

## 2022-07-30 LAB — COMPREHENSIVE METABOLIC PANEL
ALT: 16 U/L (ref 0–44)
AST: 26 U/L (ref 15–41)
Albumin: 2.4 g/dL — ABNORMAL LOW (ref 3.5–5.0)
Alkaline Phosphatase: 71 U/L (ref 38–126)
Anion gap: 10 (ref 5–15)
BUN: 40 mg/dL — ABNORMAL HIGH (ref 8–23)
CO2: 28 mmol/L (ref 22–32)
Calcium: 9.2 mg/dL (ref 8.9–10.3)
Chloride: 101 mmol/L (ref 98–111)
Creatinine, Ser: 1.55 mg/dL — ABNORMAL HIGH (ref 0.61–1.24)
GFR, Estimated: 46 mL/min — ABNORMAL LOW (ref >60)
Glucose, Bld: 161 mg/dL — ABNORMAL HIGH (ref 70–99)
Potassium: 3.7 mmol/L (ref 3.5–5.1)
Sodium: 139 mmol/L (ref 135–145)
Total Bilirubin: 0.5 mg/dL (ref 0.3–1.2)
Total Protein: 7.4 g/dL (ref 6.5–8.1)

## 2022-07-30 LAB — GLUCOSE, CAPILLARY
Glucose-Capillary: 174 mg/dL — ABNORMAL HIGH (ref 70–99)
Glucose-Capillary: 82 mg/dL (ref 70–99)
Glucose-Capillary: 132 mg/dL — ABNORMAL HIGH (ref 70–99)
Glucose-Capillary: 123 mg/dL — ABNORMAL HIGH (ref 70–99)

## 2022-07-30 MED ORDER — SORBITOL 70 % SOLN
60.0000 mL | Status: AC
  Filled 2022-07-30: qty 60

## 2022-07-30 MED ORDER — SENNOSIDES-DOCUSATE SODIUM 8.6-50 MG PO TABS
2.0000 | ORAL_TABLET | Freq: Every day | ORAL | Status: DC
  Administered 2022-07-30 – 2022-08-02 (×4): 2 via ORAL
  Filled 2022-07-30 (×4): qty 2

## 2022-07-30 NOTE — Progress Notes (Addendum)
Patient ID: Oscar Weiand., male   DOB: April 21, 1946, 76 y.o.   MRN: [id number]  Team Conference Report to Patient/Family  Team Conference discussion was reviewed with the patient and caregiver, including goals, any changes in plan of care and target discharge date.  Patient and caregiver express understanding and are in agreement.  The patient has a target discharge date of 08/08/22.  Sw met with patient., introduced self and explained role. Patient anticipates discharging home with his daughter and SIL. Patient not opposed to staying the full LOS vs going home before Christmas. Sw will discuss with patient family, no additional questions or concerns. Patient will require ramp and family education.    2:13PM: Sw spoke with patient daughter, Oscar [name] and provided conference updates.Daughter pleased with discharge date and would like patient to be independent as possible due her her spouse working at night and daughter working during the day. No additional questions or concerns.  Dyanne Iha 07/30/2022, 1:39 PM

## 2022-07-30 NOTE — Progress Notes (Signed)
Patient ID: Morgen Ritacco., male   DOB: 07/05/1946, 76 y.o.   MRN: [id number] Met with the patient to review current situation, rehab process, team conference and plan of care. Patient noted pain is managed with current regimen, tendonitis right elbow improved and right knee meniscus tear is tolerable with Voltaren gel. Reports having trouble with TDWB on left foot; no shoe noted in room.  Clarified he will discharge home with dtr, Katharine Look. Has home CPAP; did not use last pm but reports slept fairly well with medications. Anxious to go home; goals is home by Christmas. Continue to follow along to address educational needs to facilitate preparation for discharge. Margarito Liner

## 2022-07-30 NOTE — Evaluation (Signed)
Physical Therapy Assessment and Plan  Patient Details  Name: Oscar [name]. MRN: [id number] Date of Birth: November 05, 1945  PT Diagnosis: Abnormal posture, Abnormality of gait, Cognitive deficits, Difficulty walking, Edema, Impaired cognition, Impaired sensation, and Muscle weakness Rehab Potential: Good ELOS: 7-10 days   Today's Date: 07/30/2022 PT Individual Time: 0830-0925 PT Individual Time Calculation (min): 55 min    Hospital Problem: Principal Problem:   Osteomyelitis of left lower extremity ([place])   Past Medical History:  Past Medical History:  Diagnosis Date   Anal fissure    h/o - no recent complications   Arthritis    hips   Atrial fibrillation (HCC)    CKD (chronic kidney disease)    Depression    Diabetes mellitus without complication ([place])    Type II   Enlarged prostate    GERD (gastroesophageal reflux disease)    Gout    H/O hiatal hernia    Hard of hearing    History of kidney stones    Hx of typhoid fever    as a child   Hypertension    improved after diet/exercise   Memory change 04/14/2017   OSA (obstructive sleep apnea)    wears cpap   Pneumonia    PONV (postoperative nausea and vomiting)    also difficulty waking up   Stroke ([place]) 08/2013   short term memory loss (slight)   Past Surgical History:  Past Surgical History:  Procedure Laterality Date   AMPUTATION Left 10/02/2017   Procedure: LEFT FOOT 1ST RAY AMPUTATION;  Surgeon: Newt Minion, MD;  Location: [place];  Service: Orthopedics;  Laterality: Left;   AMPUTATION Left 01/15/2018   Procedure: LEFT FOOT 2ND TOE AMPUTATION;  Surgeon: Newt Minion, MD;  Location: [place];  Service: Orthopedics;  Laterality: Left;   AMPUTATION Left 07/23/2022   Procedure: LEFT TRANSMETATARSAL AMPUTATION;  Surgeon: Newt Minion, MD;  Location: [place];  Service: Orthopedics;  Laterality: Left;   CARPAL TUNNEL RELEASE Left 12/26/2021   Procedure: CARPAL TUNNEL RELEASE;  Surgeon: Leanora Cover, MD;  Location: [place];  Service: Orthopedics;  Laterality: Left;   carpel tunnel Bilateral    CATARACT EXTRACTION W/ INTRAOCULAR LENS  IMPLANT, BILATERAL     CHOLECYSTECTOMY     COLONOSCOPY     EYE SURGERY     laser surgery    I & D EXTREMITY Left 09/15/2014   Procedure: Excision Necrotic Left Achilles, Skin Graft, apply Wound VAC;  Surgeon: Newt Minion, MD;  Location: [employer];  Service: Orthopedics;  Laterality: Left;   KIDNEY STONE SURGERY     KNEE ARTHROSCOPY Bilateral    LIGAMENT REPAIR     TONSILLECTOMY     VASECTOMY      Assessment & Plan Clinical Impression: Patient is a 76 y.o. year old male with a history of a Wagner grade 1 ulcer beneath the second metatarsal head of the left foot.  He also had a wound on the second toe dorsum of the inner phalangeal joint.  He had been on doxycycline for the last 2 weeks secondary to concern for infection.  He failed conservative measures and elected for surgical management.  He underwent left transmetatarsal amputation by Dr. Sharol Given on 07/23/2022.  His medical history is also significant for chronic diastolic heart failure followed by Dr. Haroldine Laws, atrial fibrillation on Eliquis, dementia on Aricept, chronic kidney disease, obstructive sleep apnea, or CVA.  He remained hemodynamically stable postoperatively.  Diabetic coordinator was consulted for  management of diabetes.  Incisional negative pressure vacuum dressing to continue for one week. Touchdown weightbearing left lower extremity. He is tolerating diabetic diet. The patient requires inpatient physical medicine and rehabilitation evaluations and treatment secondary to dysfunction due to left transmetatarsal amputation. Lives with son but will go home to daughter's home.  Patient currently requires mod with mobility secondary to muscle weakness, decreased cardiorespiratoy endurance, and decreased standing balance, decreased postural control, decreased balance strategies, and difficulty maintaining precautions.  Prior to  hospitalization, patient was independent  with mobility and lived with Daughter (D/C home to daughter and son in [place] home) in a House home.  Home access is 3 steps with no rails to enter front and 4 steps with R handrail entering from garageStairs to enter.  Patient will benefit from skilled PT intervention to maximize safe functional mobility, minimize fall risk, and decrease caregiver burden for planned discharge home with intermittent assist.  Anticipate patient will benefit from follow up [facility] at discharge.  PT - End of Session Activity Tolerance: Tolerates 30+ min activity with multiple rests Endurance Deficit: Yes Endurance Deficit Description: mild SOB before/during/after transfers with rest breaks needed PT Assessment Rehab Potential (ACUTE/IP ONLY): Good PT Barriers to Discharge: [place] home environment;Decreased caregiver support;Home environment access/layout;Wound Care;Weight;Weight bearing restrictions PT Barriers to Discharge Comments: 4 STE with 1 handrail, daughter works and takes care of children during the day, LLE TDWB restrictions PT Patient demonstrates impairments in the following area(s): Balance;Edema;Endurance;Motor;Nutrition;Perception;Skin Integrity PT Transfers Functional Problem(s): Bed Mobility;Bed to Chair;Car;Furniture PT Locomotion Functional Problem(s): Ambulation;Wheelchair Mobility;Stairs PT Plan PT Intensity: Minimum of 1-2 x/day ,45 to 90 minutes PT Frequency: 5 out of 7 days PT Duration Estimated Length of Stay: 7-10 days PT Treatment/Interventions: Ambulation/gait training;Community reintegration;DME/adaptive equipment instruction;Neuromuscular re-education;Psychosocial support;Stair training;UE/LE Strength taining/ROM;Wheelchair propulsion/positioning;Balance/vestibular training;Discharge planning;Pain management;Skin care/wound management;Therapeutic Activities;UE/LE Coordination activities;Cognitive remediation/compensation;Disease  management/prevention;Functional mobility training;Patient/family education;Splinting/orthotics;Therapeutic Exercise PT Transfers Anticipated Outcome(s): Mod I PT Locomotion Anticipated Outcome(s): min A with LRAD PT Recommendation Follow Up Recommendations: Home health PT Patient destination: Home Equipment Recommended: To be determined   PT Evaluation Precautions/Restrictions Precautions Precautions: Fall Precaution Comments: Left transmetatarsal amputation, wound vac L foot Required Braces or Orthoses: Other Brace Other Brace: post op shoe on LLE when OOB Restrictions Weight Bearing Restrictions: Yes LLE Weight Bearing: Touchdown weight bearing Pain Interference Pain Interference Pain Effect on Sleep: 1. Rarely or not at all Pain Interference with Therapy Activities: 1. Rarely or not at all Pain Interference with Day-to-Day Activities: 1. Rarely or not at all Home Living/Prior [place] Available Help at Discharge: Family;Available PRN/intermittently (SIL works night shift, daughter works remotely and intermittently out of the home) Type of Home: House Home Access: Stairs to enter [employer] of Steps: 3 steps with no rails to enter front and 4 steps with R handrail entering from garage [place]: Two level;Able to live on main level with bedroom/bathroom;1/2 bath on main level Alternate Level Stairs-Rails: Can reach both Bathroom Shower/Tub: Other (comment) (reports he was taking full shower PTA but plans to sponge bathe at daughters home due to 1st floor only having 1/2 bath and no plan to go upstairs) Biochemist, clinical: Standard Bathroom Accessibility: Yes Additional Comments: Daughter and son in law are RN's  Lives With: Daughter (D/C home to daughter and son in [place] home) Prior Function Level of Independence: Independent with basic ADLs;Independent with transfers;Independent with homemaking with ambulation;Independent with gait  Able to Take  Stairs?: Yes Driving: Yes Vision/Perception  Vision - History Ability to  See in Adequate Light: 0 Adequate Vision - Assessment Additional Comments: has history of diabetic retinopathy at baseline Perception Perception: Within Functional Limits Praxis Praxis: Intact  Cognition Overall Cognitive Status: No family/caregiver present to determine baseline cognitive functioning Arousal/Alertness: Awake/alert Orientation Level: Oriented X4 Memory: Appears intact Awareness: Appears intact Problem Solving: Impaired Safety/Judgment: Appears intact Sensation Sensation Light Touch: Impaired by gross assessment Hot/Cold: Not tested Proprioception: Appears Intact Stereognosis: Not tested Additional Comments: has peripheral neuropathy at baseline but BUE intact Coordination Gross Motor Movements are Fluid and Coordinated: No Fine Motor Movements are Fluid and Coordinated: No Coordination and Movement Description: grossly uncoordinated due to altered balance strategies 2/2 LLE TDWB precautions Finger Nose Finger Test: dysmetria Heel Shin Test: [facility] - difficulty understanding instructions Motor  Motor Motor: Abnormal postural alignment and control Motor - Skilled Clinical Observations: grossly uncoordinated due to altered balance strategies 2/2 LLE TDWB precautions  Trunk/Postural Assessment  Cervical Assessment Cervical Assessment: Exceptions to [facility] (forward head) Thoracic Assessment Thoracic Assessment: Exceptions to [facility] (rounded shoulders) Lumbar Assessment Lumbar Assessment: Exceptions to [facility] (anterior pelvic tilt) Postural Control Postural Control: Within Functional Limits  Balance Balance Balance Assessed: Yes Static Sitting Balance Static Sitting - Balance Support: Feet supported;Bilateral upper extremity supported Static Sitting - Level of Assistance: 5: Stand by assistance (supervision) Dynamic Sitting Balance Dynamic Sitting - Balance Support: Feet supported;No upper  extremity supported Dynamic Sitting - Level of Assistance: 5: Stand by assistance (CGA) Sitting balance - Comments: seated EOB Extremity Assessment  RLE Assessment RLE Assessment: Exceptions to [facility] General Strength Comments: tested in sitting RLE Strength Right Hip Flexion: 3+/5 Right Hip ABduction: 4-/5 Right Hip ADduction: 4-/5 Right Knee Flexion: 3+/5 Right Knee Extension: 4-/5 Right Ankle Dorsiflexion: 3+/5 Right Ankle Plantar Flexion: 4/5 LLE Assessment LLE Assessment: Exceptions to [facility] General Strength Comments: tested in sitting LLE Strength Left Hip Flexion: 3+/5 Left Hip ABduction: 3+/5 Left Hip ADduction: 3+/5 Left Knee Flexion: 3+/5 Left Knee Extension: 4-/5  Care Tool Care Tool Bed Mobility Roll left and right activity   Roll left and right assist level: Supervision/Verbal cueing    Sit to lying activity        Lying to sitting on side of bed activity   Lying to sitting on side of bed assist level: the ability to move from lying on the back to sitting on the side of the bed with no back support.: Supervision/Verbal cueing     Care Tool Transfers Sit to stand transfer Sit to stand activity did not occur: Refused      Chair/bed transfer   Chair/bed transfer assist level: Moderate Assistance - Patient 50 - 74% (slideboard)     Toilet transfer   Assist Level: Moderate Assistance - Patient 50 - 74% (slideboard transfer)    Scientist, product/process development transfer activity did not occur: Safety/medical concerns (fatigue)        Care Tool Locomotion Ambulation Ambulation activity did not occur: Safety/medical concerns (fatigue, LLE TDWB precautions, R knee discomfort)        Walk 10 feet activity Walk 10 feet activity did not occur: Safety/medical concerns (fatigue, LLE TDWB precautions, R knee discomfort)       Walk 50 feet with 2 turns activity Walk 50 feet with 2 turns activity did not occur: Safety/medical concerns (fatigue, LLE TDWB precautions, R knee  discomfort)      Walk 150 feet activity Walk 150 feet activity did not occur: Safety/medical concerns (fatigue, LLE TDWB precautions, R knee discomfort)  Walk 10 feet on uneven surfaces activity Walk 10 feet on uneven surfaces activity did not occur: Safety/medical concerns (fatigue, LLE TDWB precautions, R knee discomfort)      Stairs Stair activity did not occur: Safety/medical concerns (fatigue, LLE TDWB precautions, R knee discomfort)        Walk up/down 1 step activity Walk up/down 1 step or curb (drop down) activity did not occur: Safety/medical concerns (fatigue, LLE TDWB precautions, R knee discomfort)      Walk up/down 4 steps activity Walk up/down 4 steps activity did not occur: Safety/medical concerns (fatigue, LLE TDWB precautions, R knee discomfort)      Walk up/down 12 steps activity Walk up/down 12 steps activity did not occur: Safety/medical concerns (fatigue, LLE TDWB precautions, R knee discomfort)      Pick up small objects from floor Pick up small object from the floor (from standing position) activity did not occur: Safety/medical concerns (fatigue, LLE TDWB precautions, R knee discomfort)      Wheelchair Is the patient using a wheelchair?: Yes Type of Wheelchair: Manual Wheelchair activity did not occur: Safety/medical concerns (fatigue)      Wheel 50 feet with 2 turns activity Wheelchair 50 feet with 2 turns activity did not occur: Safety/medical concerns (fatigue)    Wheel 150 feet activity Wheelchair 150 feet activity did not occur: Safety/medical concerns (fatigue)      Refer to Care Plan for Long Term Goals  SHORT TERM GOAL WEEK 1 PT Short Term Goal 1 (Week 1): STG=LTG due to LOS  Recommendations for other services: None   Skilled Therapeutic Intervention Evaluation completed (see details above and below) with education on PT POC and goals and individual treatment initiated with focus on functional mobility/transfers, generalized  strengthening and endurance, and dressing. Received pt semi-reclined in bed, pt educated on PT evaluation, CIR policies, and therapy schedule and agreeable. Pt denied any pain at rest. Provided pt with 22x18 manual WC and slideboard. Donned post-op shoe and shorts in supine with mod A to thread legs through. Pt transferred semi-reclined<>sitting EOB with HOB slightly elevated and supervision. Doffed dirty gown and donned clean pull over shirt with supervision. Donned R sock and shoe with max A. Set pt up to stand, however pt politely declined attempting to stand with RW, stating his R knee could not support him with his LLE TDWB precautions. Pt transferred bed<>WC via slideboard with light mod A (total A to place board) and cues for anterior weight shifting, hand placement, and scooting technique - with pt demonstrating good adherence to LLE TDWB precautions. RN arrived to administer medications and notified to order new bed as current bed tilts and does not remain straight. Concluded session with pt sitting in WC, needs within reach, and seatbelt alarm on. Safety plan updated.   Mobility Bed Mobility Bed Mobility: Rolling Right;Supine to Sit Rolling Right: Supervision/verbal cueing Supine to Sit: Supervision/Verbal cueing Transfers Transfers: Lateral/Scoot Transfers Lateral/Scoot Transfers: Moderate Assistance - Patient 50-74% Transfer (Assistive device): Other (Comment) (slideboard) Locomotion  Gait Ambulation: No Gait Gait: No Stairs / Additional Locomotion Stairs: No Wheelchair Mobility Wheelchair Mobility: No   Discharge Criteria: Patient will be discharged from PT if patient refuses treatment 3 consecutive times without medical reason, if treatment goals not met, if there is a change in medical status, if patient makes no progress towards goals or if patient is discharged from hospital.  The above assessment, treatment plan, treatment alternatives and goals were discussed and mutually  agreed upon: by patient  [place], DPT  07/30/2022, 12:29 PM

## 2022-07-30 NOTE — Progress Notes (Signed)
Patient ID: Oscar [name]., male   DOB: 15-Mar-1946, 76 y.o.   MRN: [id number] Patient sitting up in bed this am eating. States is ready to discharge to rehab. No concerns voiced. Dry dressing in place. Vac was removed yesterday.   Suzan Slick, NP [phone]

## 2022-07-30 NOTE — Patient Care Conference (Signed)
Inpatient RehabilitationTeam Conference and Plan of Care Update Date: 07/30/2022   Time: 11:57 AM    Patient Name: Oscar [name].      Medical Record Number: [id number]  Date of Birth: 08-13-1945 Sex: Male         Room/Bed: 4M11C/4M11C-01 Payor Info: Payor: MEDICARE / Plan: MEDICARE PART A AND B / Product Type: *No Product type* /    Admit Date/Time:  07/29/2022  4:51 PM  Primary Diagnosis:  Osteomyelitis of left lower extremity [facility])  Hospital Problems: Principal Problem:   Osteomyelitis of left lower extremity [facility])    Expected Discharge Date: Expected Discharge Date: 08/08/22  Team Members Present: Physician leading conference: Dr. Jennye Boroughs Nurse Present: Dorien Chihuahua, RN PT Present: Becky Sax, PT OT Present: Jennefer Bravo, OT SLP Present: Helaine Chess, SLP PPS Coordinator present : Gunnar Fusi, SLP     Current Status/Progress Goal Weekly Team Focus  Bowel/Bladder   Pt is continent of b/b. LBM doucmented: 12/17   Remain continent of b/b   Assist w/ toileting needs q 2-4 hours and PRN.    Swallow/Nutrition/ Hydration               ADL's   Mod A bathing, dressing and toileting   Mod I to supervision   ADL retraining, functional transfers, general strengthening, endurance, safety education    Mobility   bed mobility supervision, slideboard transfers mod A   Mod I  functional mobility/transfers, generalized strengthening and endurance, dynamic standing balance/coordination, D/C planning    Communication                Safety/Cognition/ Behavioral Observations               Pain   Pt denies pain.   Remain pain free.   Assess pain q shift & PRN.    Skin   surgical incision to L foot - dressing in place   Prevent any skin breakdowns/issues. Assess surgical site and change dressing accordingly.  Assess skin q shift & PRN.      Discharge Planning:   Evals pending  Team Discussion: Patient with hx of DM, Previous stroke (2015),  HTN, A-fib on Eliquis and flomax for urinary retention and admitted post OM - left transmetatarsal amputation for Wegner's ulcer.  Patient on target to meet rehab goals: yes, currently needs supervision for bed moiblity, +2 assist to stand. Needs mod assist with cues for sequencing to complete slide - board transfers.  Needs mod assist for ADLs, Heavy mod assist transfer to [facility] with cues for clothes management. Goals for discharge set for mod I overall.   *See Care Plan and progress notes for long and short-term goals.   Revisions to Treatment Plan:  N/a  Teaching Needs: Safety, skin care, dietary modification, incision care, weight bearing restrictions, medications, transfers, etc.   Current Barriers to Discharge: Decreased caregiver support, Home enviroment access/layout, and Weight bearing restrictions  Possible Resolutions to Barriers: Family education DME: RW, Bari DA - BSC, W/C Ramp recommended for entry to home     Medical Summary Current Status: s/p left TMA for osteo. pain mgt, wound care issues, bp under fair control, diabetic.  Barriers to Discharge: Medical stability;Complicated Wound;Uncontrolled Diabetes   Possible Resolutions to [employer]: daily assessment of wound, cbg's and pain with subsequent medical mgt   Continued Need for Acute Rehabilitation Level of Care: The patient requires daily medical management by a physician with specialized training in physical medicine and rehabilitation  for the following reasons: Direction of a multidisciplinary physical rehabilitation program to maximize functional independence : Yes Medical management of patient stability for increased activity during participation in an intensive rehabilitation regime.: Yes Analysis of laboratory values and/or radiology reports with any subsequent need for medication adjustment and/or medical intervention. : Yes   I attest that I was present, lead the team conference, and concur with  the assessment and plan of the team.   Dorien Chihuahua B 07/30/2022, 4:26 PM

## 2022-07-30 NOTE — Evaluation (Signed)
Occupational Therapy Assessment and Plan  Patient Details  Name: Oscar [name]. MRN: [id number] Date of Birth: Jan 22, 1946  OT Diagnosis: abnormal posture, acute pain, and generalized weakness and strength Rehab Potential: Rehab Potential (ACUTE ONLY): Good ELOS: 7-10 days   Today's Date: 07/30/2022 OT Individual Time: 1035-1130 & 1300-1400 OT Individual Time Calculation (min): 55 min  & 60 min  Hospital Problem: Principal Problem:   Osteomyelitis of left lower extremity ([place])   Past Medical History:  Past Medical History:  Diagnosis Date   Anal fissure    h/o - no recent complications   Arthritis    hips   Atrial fibrillation (HCC)    CKD (chronic kidney disease)    Depression    Diabetes mellitus without complication ([place])    Type II   Enlarged prostate    GERD (gastroesophageal reflux disease)    Gout    H/O hiatal hernia    Hard of hearing    History of kidney stones    Hx of typhoid fever    as a child   Hypertension    improved after diet/exercise   Memory change 04/14/2017   OSA (obstructive sleep apnea)    wears cpap   Pneumonia    PONV (postoperative nausea and vomiting)    also difficulty waking up   Stroke ([place]) 08/2013   short term memory loss (slight)   Past Surgical History:  Past Surgical History:  Procedure Laterality Date   AMPUTATION Left 10/02/2017   Procedure: LEFT FOOT 1ST RAY AMPUTATION;  Surgeon: Newt Minion, MD;  Location: [facility];  Service: Orthopedics;  Laterality: Left;   AMPUTATION Left 01/15/2018   Procedure: LEFT FOOT 2ND TOE AMPUTATION;  Surgeon: Newt Minion, MD;  Location: [facility];  Service: Orthopedics;  Laterality: Left;   AMPUTATION Left 07/23/2022   Procedure: LEFT TRANSMETATARSAL AMPUTATION;  Surgeon: Newt Minion, MD;  Location: [facility];  Service: Orthopedics;  Laterality: Left;   CARPAL TUNNEL RELEASE Left 12/26/2021   Procedure: CARPAL TUNNEL RELEASE;  Surgeon: Leanora Cover, MD;  Location: [facility];  Service: Orthopedics;   Laterality: Left;   carpel tunnel Bilateral    CATARACT EXTRACTION W/ INTRAOCULAR LENS  IMPLANT, BILATERAL     CHOLECYSTECTOMY     COLONOSCOPY     EYE SURGERY     laser surgery    I & D EXTREMITY Left 09/15/2014   Procedure: Excision Necrotic Left Achilles, Skin Graft, apply Wound VAC;  Surgeon: Newt Minion, MD;  Location: [facility];  Service: Orthopedics;  Laterality: Left;   KIDNEY STONE SURGERY     KNEE ARTHROSCOPY Bilateral    LIGAMENT REPAIR     TONSILLECTOMY     VASECTOMY      Assessment & Plan Clinical Impression:  Oscar [name] is a 76 year old diabetic male with a history of a Wagner grade 1 ulcer beneath the second metatarsal head of the left foot.  He also had a wound on the second toe dorsum of the inner phalangeal joint.  He had been on doxycycline for the last 2 weeks secondary to concern for infection.  He failed conservative measures and elected for surgical management.  He underwent left transmetatarsal amputation by Dr. Sharol Given on 07/23/2022.  His medical history is also significant for chronic diastolic heart failure followed by Dr. Haroldine Laws, atrial fibrillation on Eliquis, dementia on Aricept, chronic kidney disease, obstructive sleep apnea, or CVA.  He remained hemodynamically stable postoperatively.  Diabetic coordinator was consulted for  management of diabetes.  Incisional negative pressure vacuum dressing to continue for one week. Touchdown weightbearing left lower extremity. He is tolerating diabetic diet. The patient requires inpatient physical medicine and rehabilitation evaluations and treatment secondary to dysfunction due to left transmetatarsal amputation. Patient transferred to CIR on 07/29/2022 .    Patient currently requires mod with basic self-care skills secondary to muscle weakness, decreased cardiorespiratoy endurance, decreased coordination, decreased problem solving, and difficulty maintaining precautions.  Prior to hospitalization, patient could complete all  self-care independently.  Patient will benefit from skilled intervention to decrease level of assist with basic self-care skills and increase independence with basic self-care skills prior to discharge home with care partner.  Anticipate patient will require intermittent supervision and follow up home health.  OT - End of Session Activity Tolerance: Tolerates < 10 min activity, no significant change in vital signs Endurance Deficit: Yes Endurance Deficit Description: mild SOB during/after transfers with rest breaks needed OT Assessment Rehab Potential (ACUTE ONLY): Good OT Barriers to Discharge: Home environment access/layout;Wound Care;Weight bearing restrictions;Weight OT Patient demonstrates impairments in the following area(s): Balance;Endurance;Pain;Motor OT Basic ADL's Functional Problem(s): Bathing;Dressing;Toileting OT Transfers Functional Problem(s): Toilet;Tub/Shower OT Additional Impairment(s): None OT Plan OT Intensity: Minimum of 1-2 x/day, 45 to 90 minutes OT Frequency: 5 out of 7 days OT Duration/Estimated Length of Stay: 7-10 days OT Treatment/Interventions: Balance/vestibular training;Discharge planning;Pain management;Self Care/advanced ADL retraining;Therapeutic Activities;UE/LE Coordination activities;Disease mangement/prevention;Functional mobility training;Patient/family education;Skin care/wound managment;Therapeutic Exercise;DME/adaptive equipment instruction;Psychosocial support;UE/LE Strength taining/ROM;Wheelchair propulsion/positioning OT Self Feeding Anticipated Outcome(s): Independent OT Basic Self-Care Anticipated Outcome(s): Mod I to supervision OT Toileting Anticipated Outcome(s): Mod I OT Bathroom Transfers Anticipated Outcome(s): Mod I to supervision OT Recommendation Recommendations for Other Services: Therapeutic Recreation consult Therapeutic Recreation Interventions: Pet therapy;Stress management Patient destination: Home Follow Up Recommendations:  Home health OT Equipment Recommended: Other (comment) Equipment Details: bariatric drop arm BSC for slideboard transfers   OT Evaluation Precautions/Restrictions  Precautions Precautions: Fall Required Braces or Orthoses: Other Brace Other Brace: post op shoe on LLE when OOB Restrictions Weight Bearing Restrictions: Yes LLE Weight Bearing: Touchdown weight bearing Home Living/Prior Functioning Home Living Available Help at Discharge: Family, Available PRN/intermittently (SIL works night shift, daughter works remotely and intermittently out of the home) Type of Home: House Home Access: Stairs to enter [employer] of Steps: 3 steps with no rails to enter front and 4 steps with R handrail entering from garage [place]: Two level, Able to live on main level with bedroom/bathroom, 1/2 bath on main level Alternate Level Stairs-Rails: Can reach both Bathroom Shower/Tub: Other (comment) (reports he was taking full shower PTA but plans to sponge bathe at daughters home due to 1st floor only having 1/2 bath and no plan to go upstairs) Biochemist, clinical: Standard Bathroom Accessibility: Yes Additional Comments: Daughter and son in law are RN's  Lives With: Daughter (D/C home to daughter and son in [place] home) IADL History Homemaking Responsibilities: No Current License: Yes Occupation: 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 Leisure and Hobbies: play golf, [place] Prior Function Level of Independence: Independent with basic ADLs, Independent with transfers, Independent with homemaking with ambulation, Independent with gait  Able to Take Stairs?: Yes Driving: Yes Vision Baseline Vision/History: 1 Wears glasses (readers) Ability to See in Adequate Light: 0 Adequate Patient Visual Report: No change from baseline Vision Assessment?: No apparent visual deficits Additional Comments: has history of diabetic retinopathy at baseline Perception   Perception: Within Functional Limits Praxis Praxis: Intact Cognition Cognition  Overall Cognitive Status: No family/caregiver present to determine baseline cognitive functioning Arousal/Alertness: Awake/alert Orientation Level: Person;Place;Situation Person: Oriented Place: Oriented Situation: Oriented Memory: Appears intact Awareness: Appears intact Problem Solving: Impaired Safety/Judgment: Appears intact Brief Interview for Mental Status (BIMS) Repetition of Three Words (First Attempt): 3 Temporal Orientation: Year: Correct Temporal Orientation: Month: Accurate within 5 days Temporal Orientation: Day: Correct Recall: "Sock": Yes, no cue required Recall: "Blue": Yes, no cue required Recall: "Bed": Yes, no cue required BIMS Summary Score: 15 Sensation Sensation Light Touch: Impaired by gross assessment Hot/Cold: Not tested Proprioception: Appears Intact Stereognosis: Not tested Additional Comments: has peripheral neuropathy at baseline but BUE intact Coordination Gross Motor Movements are Fluid and Coordinated: No Fine Motor Movements are Fluid and Coordinated: No Finger Nose Finger Test: dysmetria Heel Shin Test: [facility] - difficulty understanding instructions Motor  Motor Motor: Abnormal postural alignment and control Motor - Skilled Clinical Observations: grossly uncoordinated due to altered balance strategies 2/2 LLE TDWB precautions  Trunk/Postural Assessment  Cervical Assessment Cervical Assessment: Exceptions to [facility] (forward head) Thoracic Assessment Thoracic Assessment: Exceptions to [facility] (rounded shoulders) Lumbar Assessment Lumbar Assessment: Exceptions to [facility] (anterior pelvic tilt) Postural Control Postural Control: Within Functional Limits  Balance Balance Balance Assessed: Yes Static Sitting Balance Static Sitting - Balance Support: Feet supported;Bilateral upper extremity supported Static Sitting - Level of Assistance: 5: Stand by assistance  (supervision) Dynamic Sitting Balance Dynamic Sitting - Balance Support: Feet supported;No upper extremity supported Dynamic Sitting - Level of Assistance: 5: Stand by assistance (CGA) Sitting balance - Comments: seated EOB Extremity/Trunk Assessment RUE Assessment RUE Assessment: Exceptions to [facility] Active Range of Motion (AROM) Comments: WFL General Strength Comments: 4-/5 grossly with history of elbow tendinitis LUE Assessment LUE Assessment: Exceptions to [facility] Active Range of Motion (AROM) Comments: WFL General Strength Comments: 4-/5 grossly  Care Tool Care Tool Self Care Eating   Eating Assist Level: Set up assist    Oral Care  Oral care, brush teeth, clean dentures activity did not occur: Refused      Bathing   Body parts bathed by patient: Right arm;Left arm;Chest;Abdomen;Front perineal area;Right upper leg;Left upper leg;Face Body parts bathed by helper: Buttocks;Right lower leg;Left lower leg   Assist Level: Moderate Assistance - Patient 50 - 74%    Upper Body Dressing(including orthotics)   What is the patient wearing?: Pull over shirt   Assist Level: Set up assist    Lower Body Dressing (excluding footwear)   What is the patient wearing?: Pants Assist for lower body dressing: Moderate Assistance - Patient 50 - 74%    Putting on/Taking off footwear   What is the patient wearing?: Socks;Orthosis Assist for footwear: Dependent - Patient 0%       Care Tool Toileting Toileting activity   Assist for toileting: Moderate Assistance - Patient 50 - 74%     Care Tool Bed Mobility Roll left and right activity        Sit to lying activity        Lying to sitting on side of bed activity         Care Tool Transfers Sit to stand transfer        Chair/bed transfer         Toilet transfer   Assist Level: Moderate Assistance - Patient 50 - 74% (slideboard transfer)     Care Tool Cognition  Expression of Ideas and Wants Expression of Ideas and Wants: 4.  Without difficulty (complex and basic) - expresses complex messages without  difficulty and with speech that is clear and easy to understand  Understanding Verbal and Non-Verbal Content Understanding Verbal and Non-Verbal Content: 4. Understands (complex and basic) - clear comprehension without cues or repetitions   Memory/Recall Ability Memory/Recall Ability : Current season;That he or she is in a hospital/hospital unit   Refer to Care Plan for [place] OT Short Term Goal 1 (Week 1): STG = LTG due to ELOS  Recommendations for other services: Therapeutic Recreation  Pet therapy and Stress management   Skilled Therapeutic Intervention Session 1 Patient received seated in w/c upon therapy arrival and agreeable to participate in OT evaluation. Education provided on OT purpose, therapy schedule, goals for therapy, and safety policy while in rehab. Pt denied pain during session. Patient demonstrates poor problem solving with cues needed for TDWB adherence on LLE especially during slide board transfers, balance and general endurance deficits resulting in difficulty completing BADL tasks without increased physical assist. Pt will benefit from skilled OT services to focus on mentioned deficits. See below for ADL and functional transfer performance.  Pt was already dressed and politely declined bathing therefore ADLs simulated for assessment, with pt voicing need for using bathroom. Able to complete mod A slideboard transfer to wide drop arm BSC after total A placement under L hip, with cues needed for safety throughout. Able to doff pants with lateral leans with min A. Pt demonstrated heavy straining for attempt at [facility]. Due to therapist time constraint, pt remained seated on BSC with call bell in hand and other needs in reach. NT made aware of pt current position and status.  Session 2 Skilled OT intervention completed with focus on SB transfers/safety education, BUE  exercises/endurance. Pt received seated in w/c with nursing present attending to IV removal on R hand however blood not clotting. Therapist assisted nursing with guaze retrieval for pressure dressing. Pt then agreeable to session. No pain reported, with therapist.  Pt initially declined self-care needs however upon exiting of room, pt reported urgency for void. Able to position self and have continent void with urinal use with set up A. Hand hygiene with mod I at sink.  Transported dependently in w/c <> gym. Educated pt on the steps to prep for SB transfer including w/c positioning, brakes, board placement and transfer technique. Max A placement of board under R buttock as pt had difficulty leaning > L while maintaining TDWB of LLE. Then mod A transfer > R side to EOM. Pt was able to recall steps of SB transfer with about 50% accuracy with mod cues needed for sequencing.  Seated EOM, pt completed the following exercises to promote strength needed for functional transfers (2x15)- With 3 pound dowel -Chest presses -Bicep flexion -Overhead presses Mod cues needed for form and technique throughout  Pt was able to verbalize correct steps for transfer back to w/c, however still required max A board placement and heavy min A transfer to w/c.  Back in room, pt remained seated in w/c with belt alarm on/activated, and with all needs in reach at end of session.   ADL ADL Eating: Set up Where Assessed-Eating: Wheelchair Grooming: Unable to assess (pt refusal) Upper Body Bathing: Setup (simulated) Where Assessed-Upper Body Bathing: Other (Comment) (BSC) Lower Body Bathing: Moderate assistance (simulated) Where Assessed-Lower Body Bathing: Other (Comment) (BSC) Upper Body Dressing: Setup Where Assessed-Upper Body Dressing: Wheelchair Lower Body Dressing: Moderate assistance Where Assessed-Lower Body Dressing: Other (Comment) (BSC) Toileting: Moderate assistance Where Assessed-Toileting: Bedside  Commode Toilet Transfer: Moderate assistance Toilet Transfer Method: Theatre manager: Extra wide drop arm bedside commode Tub/Shower Transfer: Unable to assess Tub/Shower Transfer Method: Unable to assess Social research officer, government: Unable to assess Social research officer, government Method: Unable to assess Mobility  Bed Mobility Bed Mobility: Rolling Right;Supine to Sit Rolling Right: Supervision/verbal cueing Supine to Sit: Supervision/Verbal cueing   Discharge Criteria: Patient will be discharged from OT if patient refuses treatment 3 consecutive times without medical reason, if treatment goals not met, if there is a change in medical status, if patient makes no progress towards goals or if patient is discharged from hospital.  The above assessment, treatment plan, treatment alternatives and goals were discussed and mutually agreed upon: by patient  Blase Mess, MS, OTR/L  07/30/2022, 3:58 PM

## 2022-07-30 NOTE — Progress Notes (Signed)
Inpatient Rehabilitation Admission Medication Review by a Pharmacist  A complete drug regimen review was completed for this patient to identify any potential clinically significant medication issues.  High Risk Drug Classes Is patient taking? Indication by Medication  Antipsychotic Yes Compazine - prn N/V  Anticoagulant Yes Apixaban - Afib  Antibiotic No   Opioid Yes Oxycodone prn pain  Antiplatelet No   Hypoglycemics/insulin Yes Insulin - DM Jardiance - DM  Vasoactive Medication Yes Furosemide - fluid Tamsulosin - BPH Diltiazem - BP, Afib  Chemotherapy No   Other Yes Trazodone prn sleep Allopurinol - gout Wellbutrin, Lexapro - mood Donepezil - dementia Simvastatin - HLD Potassium - supplement     Type of Medication Issue Identified Description of Issue Recommendation(s)  Drug Interaction(s) (clinically significant)     Duplicate Therapy     Allergy     No Medication Administration End Date     Incorrect Dose     Additional Drug Therapy Needed     Significant med changes from prior encounter (inform family/care partners about these prior to discharge).    Other       Clinically significant medication issues were identified that warrant physician communication and completion of prescribed/recommended actions by midnight of the next day:  No  Pharmacist comments: None  Time spent performing this drug regimen review (minutes):  30 minutes  Thank you Anette Guarneri, PharmD

## 2022-07-30 NOTE — Progress Notes (Signed)
Physical Therapy Session Note  Patient Details  Name: Oscar [name]. MRN: [id number] Date of Birth: 1945-12-17  Today's Date: 07/30/2022 PT Individual Time: 1430-1455 PT Individual Time Calculation (min): 25 min   Short Term Goals: Week 1:  PT Short Term Goal 1 (Week 1): STG=LTG due to LOS  Skilled Therapeutic Interventions/Progress Updates:   Received pt sitting in WC, pt agreeable to PT treatment, and denied any pain during session. Pt reported not remembering if he had lunch but requesting to return to bed due to fatigue from being up in [facility] all day. Session with emphasis on functional mobility/transfers and generalized strengthening and endurance. Pt transferred WC<>bed via slideboard to L with CGA and total A to place board - cues for scooting technique and adherence to LLE TDWB precautions. Pt transferred sit<>supine with supervision and removed shoes with max A. Placed recommendation to order pt 22x18 manual WC for DC, then pt performed the following exercises with emphasis on LE strength:  -SLR x10 bilaterally -hip abduction x10 bilaterally -hip adduction pillow squeezes x20 Pt requested to take a nap and NT arrived to perform bladder scan. Concluded session with pt semi-reclined in bed, needs within reach, and bed alarm on.   Therapy Documentation Precautions:  Restrictions Weight Bearing Restrictions: Yes LLE Weight Bearing: Touchdown weight bearing  Therapy/Group: Individual Therapy Alfonse Alpers PT, DPT  07/30/2022, 7:19 AM

## 2022-07-30 NOTE — Plan of Care (Signed)
  Problem: Sit to Stand Goal: LTG:  Patient will perform sit to stand in prep for activites of daily living with assistance level (OT) Description: LTG:  Patient will perform sit to stand in prep for activites of daily living with assistance level (OT) Flowsheets (Taken 07/30/2022 1221) LTG: PT will perform sit to stand in prep for activites of daily living with assistance level: Moderate Assistance - Patient 50 - 74%   Problem: RH Bathing Goal: LTG Patient will bathe all body parts with assist levels (OT) Description: LTG: Patient will bathe all body parts with assist levels (OT) Flowsheets (Taken 07/30/2022 1221) LTG: Pt will perform bathing with assistance level/cueing: Supervision/Verbal cueing   Problem: RH Dressing Goal: LTG Patient will perform lower body dressing w/assist (OT) Description: LTG: Patient will perform lower body dressing with assist, with/without cues in positioning using equipment (OT) Flowsheets (Taken 07/30/2022 1221) LTG: Pt will perform lower body dressing with assistance level of: Independent with assistive device   Problem: RH Toileting Goal: LTG Patient will perform toileting task (3/3 steps) with assistance level (OT) Description: LTG: Patient will perform toileting task (3/3 steps) with assistance level (OT)  Flowsheets (Taken 07/30/2022 1221) LTG: Pt will perform toileting task (3/3 steps) with assistance level: Independent with assistive device   Problem: RH Toilet Transfers Goal: LTG Patient will perform toilet transfers w/assist (OT) Description: LTG: Patient will perform toilet transfers with assist, with/without cues using equipment (OT) Flowsheets (Taken 07/30/2022 1221) LTG: Pt will perform toilet transfers with assistance level of: Independent with assistive device   Problem: RH Tub/Shower Transfers Goal: LTG Patient will perform tub/shower transfers w/assist (OT) Description: LTG: Patient will perform tub/shower transfers with assist,  with/without cues using equipment (OT) Flowsheets (Taken 07/30/2022 1221) LTG: Pt will perform tub/shower stall transfers with assistance level of: Supervision/Verbal cueing

## 2022-07-30 NOTE — Progress Notes (Signed)
PROGRESS NOTE   Subjective/Complaints: Had a reasonable night. Left foot was not overly tender. Was able to sleep. Ready for therapy today  ROS: Patient denies fever, rash, sore throat, blurred vision, dizziness, nausea, vomiting, diarrhea, cough, shortness of breath or chest pain,   headache, or mood change.    Objective:   No results found. Recent Labs    07/30/22 0519  WBC 8.3  HGB 11.5*  HCT 36.6*  PLT 278   Recent Labs    07/30/22 0519  NA 139  K 3.7  CL 101  CO2 28  GLUCOSE 161*  BUN 40*  CREATININE 1.55*  CALCIUM 9.2    Intake/Output Summary (Last 24 hours) at 07/30/2022 0845 Last data filed at 07/30/2022 0806 Gross per 24 hour  Intake 472 ml  Output 2425 ml  Net -1953 ml        Physical Exam: Vital Signs Blood pressure (!) 117/91, pulse 68, temperature 98.1 F (36.7 C), temperature source Oral, resp. rate 18, height '5\' 9"'$  (1.753 m), weight 114.9 kg, SpO2 96 %.  General: Alert and oriented x 3, No apparent distress HEENT: Head is normocephalic, atraumatic, PERRLA, EOMI, sclera anicteric, oral mucosa pink and moist, dentition intact, ext ear canals clear,  Neck: Supple without JVD or lymphadenopathy Heart: Reg rate and rhythm. No murmurs rubs or gallops Chest: CTA bilaterally without wheezes, rales, or rhonchi; no distress Abdomen: Soft, non-tender, non-distended, bowel sounds positive. Extremities: No clubbing, cyanosis, or edema. Pulses are 2+ Psych: Pt's affect is appropriate. Pt is cooperative Skin: left TMA incision CDI with scab, dried blood adhered to gauze. 2-3 spots of ishemic appearing superficial tisse on dorsum/sides of foot. Neuro:  Alert and oriented x 3. Normal insight and awareness. Intact Memory. Normal language and speech. Cranial nerve exam unremarkable. Motor 5/5 UE, RLE 4-5/5. LLE 3-4/5. Sl decreased LT below ankles. DTR's 1+ Musculoskeletal: left foot tender to touch, mild  edema.     Assessment/Plan: 1. Functional deficits which require 3+ hours per day of interdisciplinary therapy in a comprehensive inpatient rehab setting. Physiatrist is providing close team supervision and 24 hour management of active medical problems listed below. Physiatrist and rehab team continue to assess barriers to discharge/monitor patient progress toward functional and medical goals  Care Tool:  Bathing              Bathing assist       Upper Body Dressing/Undressing Upper body dressing        Upper body assist      Lower Body Dressing/Undressing Lower body dressing            Lower body assist       Toileting Toileting    Toileting assist       Transfers Chair/bed transfer  Transfers assist           Locomotion Ambulation   Ambulation assist              Walk 10 feet activity   Assist           Walk 50 feet activity   Assist           Walk 150 feet  activity   Assist           Walk 10 feet on uneven surface  activity   Assist           Wheelchair     Assist               Wheelchair 50 feet with 2 turns activity    Assist            Wheelchair 150 feet activity     Assist          Blood pressure (!) 117/91, pulse 68, temperature 98.1 F (36.7 C), temperature source Oral, resp. rate 18, height '5\' 9"'$  (1.753 m), weight 114.9 kg, SpO2 96 %.  Medical Problem List and Plan: 1. Functional deficits secondary to debility secondary to left foot osteomyelitis s/p left TMA             -patient may shower if foot/vac kept dry             -ELOS/Goals: 5-7 days, supervision to min assist with mobility and self-care  -Patient is beginning CIR therapies today including PT and OT  2.  Antithrombotics: -DVT/anticoagulation:  Pharmaceutical: Eliquis             -antiplatelet therapy: none     3. Pain Management: Tylenol, Robaxin, oxycodone as needed             -Voltaren prn right  knee and elbow pain 4. Mood/Behavior/Sleep: LCSW to evaluate and provide emotional support             -antipsychotic agents: n/a             -continue Wellbutrin SR 150 mg q AM, Lexapro 40 mg q AM             -melatonin prn for sleep 5. Neuropsych/cognition: This patient is capable of making decisions on his own behalf.   6. Skin/Wound Care: routine skin care checks             -would use non-adherent dressing, kerlix, and ACE   7. Fluids/Electrolytes/Nutrition: strict Is and Os and follow-up chemistries             -carb modified diet   -glucerna supplements for protein 8: Left TMA: vac off,  dressing today as above   9: DM: CBGs q AC and q HS; A1c= 7.5             -continue SSI             -Levemir 20 units q HS             -Novolog increase to 6 units TID with meals; hold if eats less than 50% of meals             -Jardiance 25 mg daily   CBG (last 3)  Recent Labs    07/29/22 1709 07/29/22 2045 07/30/22 0552  GLUCAP 156* 206* 174*    12/20 monitor for pattern today 10: Chronic diastolic CHF: daily weight             -continue diltiazem 360 mg daily             -continue Lasix 80 mg daily   -12/20 bp borderline. observe 11: Atrial fibrillation:             -continue Eliquis   12: Hypertension:              -continue diltiazem 360 mg daily -  continue Lasix 80 mg daily  -12/20 borderline 13: OSA: wears CPAP at night but inconsistently   14: CKD: baseline creatinine 2.1-2.3; follow-up BMP   12/20 Cr 1.55--actually below range 15: Prior CVA with cognitive impairment: continue Aricept 10 mg q evening   16: Hypokalemia: continue supplementation and follow-up BMP   17: Hyperlipidemia: continue simvastatin   18: Urinary hesitancy/frequency: continue Flomax 0.4 mg daily             -check PVRs             -pt has had some flow issues post-operatively 19: Gout, history of: resume allopurinol 300 mg q AM            20 Post-op anemia: hgb with sl drop to 11.5  -fe+  supp  -diet  LOS: 1 days A FACE TO FACE EVALUATION WAS PERFORMED  Meredith Staggers 07/30/2022, 8:45 AM

## 2022-07-30 NOTE — Discharge Summary (Signed)
Discharge Diagnoses:  Principal Problem:   Osteomyelitis of left lower extremity ([place])   Surgeries:  on left TMA    Consultants:   Discharged Condition: Improved  Hospital Course: Oscar [name]. is an 76 y.o. male who was admitted 07/29/2022 with a chief complaint of No chief complaint on file. , and found to have a diagnosis of right TMA.  They were brought to the operating room on and underwent R TMA on 07/23/22.    They were given perioperative antibiotics:  Anti-infectives (From admission, onward)    None     .  They were given sequential compression devices, early ambulation, and Eliquis for DVT prophylaxis.  Recent vital signs: Patient Vitals for the past 24 hrs:  BP Temp Temp src Pulse Resp SpO2 Height Weight  07/30/22 0445 -- -- -- -- -- -- -- 114.9 kg  07/30/22 0431 (!) 117/91 98.1 F (36.7 C) Oral 68 18 96 % -- --  07/29/22 1931 (!) 108/59 98.3 F (36.8 C) Oral 69 18 96 % -- --  07/29/22 1701 136/78 98.1 F (36.7 C) Oral 78 18 96 % _0  (1.753 m) 105.8 kg  .  Recent laboratory studies: No results found.  Discharge Medications:   Allergies as of 07/30/2022       Reactions   Iohexol Hives, Other (See Comments)    Desc: HIVES S/P 13HR.PREMEDS, ?LOW DOSAGE PREMEDS   Ivp Dye [iodinated Contrast Media] Hives, Other (See Comments)   welps   Sulfamethoxazole-trimethoprim Other (See Comments)   Gi upset        Medication List     TAKE these medications    acetaminophen 500 MG tablet Commonly known as: TYLENOL Take 1,000 mg by mouth 2 (two) times daily as needed for moderate pain, headache, fever or mild pain.   allopurinol 300 MG tablet Commonly known as: ZYLOPRIM Take 150 mg by mouth every morning.   B-D INS SYRINGE 0.5CC/30GX1/2" 30G X 1/2" 0.5 ML Misc Generic drug: Insulin Syringe-Needle U-100 USE THREE TIMES A DAY DX-E11.65 INJECTION   blood glucose meter kit and supplies Kit Dispense based on patient and insurance preference. Use up to  four times daily as directed. (FOR ICD-9 250.00, 250.01).   buPROPion 150 MG 12 hr tablet Commonly known as: WELLBUTRIN SR Take 150 mg by mouth every morning.   diltiazem 360 MG 24 hr capsule Commonly known as: CARDIZEM CD Take 1 capsule (360 mg total) by mouth daily.   donepezil 10 MG tablet Commonly known as: ARICEPT Take 1 at bedtime What changed:  how much to take how to take this when to take this additional instructions   doxycycline 100 MG tablet Commonly known as: VIBRA-TABS Take 1 tablet (100 mg total) by mouth 2 (two) times daily.   Eliquis 5 MG Tabs tablet Generic drug: apixaban TAKE 1 TABLET BY MOUTH TWICE A DAY   escitalopram 20 MG tablet Commonly known as: LEXAPRO Take 40 mg by mouth every morning.   furosemide 80 MG tablet Commonly known as: LASIX Take 1 tablet (80 mg total) by mouth daily. T   indomethacin 50 MG capsule Commonly known as: INDOCIN Take 1 capsule (50 mg total) by mouth 3 (three) times daily as needed (for gout flare ups ). Reported on 12/27/2015   insulin aspart 100 UNIT/ML injection Commonly known as: novoLOG Inject 10-50 Units into the skin 3 (three) times daily as needed for high blood sugar. Sliding scale   Jardiance 25 MG Tabs tablet  Generic drug: empagliflozin Take 25 mg by mouth daily.   Levemir 100 UNIT/ML injection Generic drug: insulin detemir Inject 10-50 Units into the skin at bedtime. Sliding scale   oxyCODONE-acetaminophen 5-325 MG tablet Commonly known as: PERCOCET/ROXICET Take 1 tablet by mouth every 4 (four) hours as needed.   oxyCODONE-acetaminophen 5-325 MG tablet Commonly known as: PERCOCET/ROXICET Take 1 tablet by mouth every 4 (four) hours as needed.   oxymetazoline 0.05 % nasal spray Commonly known as: AFRIN Place 1 spray into both nostrils daily as needed for congestion.   pentoxifylline 400 MG CR tablet Commonly known as: TRENTAL TAKE 1 TABLET BY MOUTH 3 TIMES DAILY WITH MEALS. What changed: See  the new instructions.   potassium chloride SA 20 MEQ tablet Commonly known as: Klor-Con M20 Take 1 tablet (20 mEq total) by mouth 2 (two) times daily.   simvastatin 20 MG tablet Commonly known as: ZOCOR Take 20 mg by mouth every evening.   tamsulosin 0.4 MG Caps capsule Commonly known as: FLOMAX Take 1 capsule (0.4 mg total) by mouth daily after breakfast. What changed: when to take this   Vitamin D 50 MCG (2000 UT) tablet Take 4,000 Units by mouth daily.        Diagnostic Studies: No results found.  They benefited maximally from their hospital stay and there were no complications.     Disposition: Discharge disposition: 02-Transferred to [facility]      to in patient rehab    Signed: Suzan Slick 07/30/2022, 9:02 AM

## 2022-07-30 NOTE — Progress Notes (Signed)
Discussed need for regular foot inspection, use of shoes, and foot care with patient.

## 2022-07-30 NOTE — Progress Notes (Signed)
Inpatient Rehabilitation Care Coordinator Assessment and Plan Patient Details  Name: Oscar [name]. MRN: [id number] Date of Birth: 08-03-1946  Today's Date: 07/30/2022  Hospital Problems: Principal Problem:   Osteomyelitis of left lower extremity [facility])  Past Medical History:  Past Medical History:  Diagnosis Date   Anal fissure    h/o - no recent complications   Arthritis    hips   Atrial fibrillation (HCC)    CKD (chronic kidney disease)    Depression    Diabetes mellitus without complication (HCC)    Type II   Enlarged prostate    GERD (gastroesophageal reflux disease)    Gout    H/O hiatal hernia    Hard of hearing    History of kidney stones    Hx of typhoid fever    as a child   Hypertension    improved after diet/exercise   Memory change 04/14/2017   OSA (obstructive sleep apnea)    wears cpap   Pneumonia    PONV (postoperative nausea and vomiting)    also difficulty waking up   Stroke ([place]) 08/2013   short term memory loss (slight)   Past Surgical History:  Past Surgical History:  Procedure Laterality Date   AMPUTATION Left 10/02/2017   Procedure: LEFT FOOT 1ST RAY AMPUTATION;  Surgeon: Newt Minion, MD;  Location: [place];  Service: Orthopedics;  Laterality: Left;   AMPUTATION Left 01/15/2018   Procedure: LEFT FOOT 2ND TOE AMPUTATION;  Surgeon: Newt Minion, MD;  Location: [place];  Service: Orthopedics;  Laterality: Left;   AMPUTATION Left 07/23/2022   Procedure: LEFT TRANSMETATARSAL AMPUTATION;  Surgeon: Newt Minion, MD;  Location: [place];  Service: Orthopedics;  Laterality: Left;   CARPAL TUNNEL RELEASE Left 12/26/2021   Procedure: CARPAL TUNNEL RELEASE;  Surgeon: Leanora Cover, MD;  Location: [place];  Service: Orthopedics;  Laterality: Left;   carpel tunnel Bilateral    CATARACT EXTRACTION W/ INTRAOCULAR LENS  IMPLANT, BILATERAL     CHOLECYSTECTOMY     COLONOSCOPY     EYE SURGERY     laser surgery    I & D EXTREMITY Left 09/15/2014   Procedure:  Excision Necrotic Left Achilles, Skin Graft, apply Wound VAC;  Surgeon: Newt Minion, MD;  Location: [place];  Service: Orthopedics;  Laterality: Left;   KIDNEY STONE SURGERY     KNEE ARTHROSCOPY Bilateral    LIGAMENT REPAIR     TONSILLECTOMY     VASECTOMY     Social History:  reports that he has never smoked. He has never used smokeless tobacco. He reports current alcohol use. He reports that he does not use drugs.  Family / Support Systems Children: Katharine Look (daughter) and  Quillian Quince (Son) Other Supports: [employer] Anticipated Caregiver: Daughter and SIL Ability/Limitations of Caregiver: daughter and SIL rotate supervision Caregiver Availability: 24/7 Family Dynamics: support from children and grandchildren  Social History Preferred language: English Religion: [employer] - How often do you need to have someone help you when you read instructions, pamphlets, or other written material from your doctor or pharmacy?: Never Writes: Yes   Abuse/Neglect Abuse/Neglect Assessment Can Be Completed: Yes Physical Abuse: Denies Verbal Abuse: Denies Sexual Abuse: Denies Exploitation of patient/patient's resources: Denies Self-Neglect: Denies  Patient response to: Social Isolation - How often do you feel lonely or isolated from those around you?: Rarely  Emotional Status Recent Psychosocial Issues: coping  Patient / Family Perceptions, Expectations & Goals Premorbid pt/family roles/activities: patient  son lived with him, independent Anticipated changes in roles/activities/participation: Discharging home with daughter and SIL to assist. Both are RN's Pt/family expectations/goals: Supervision to [place]: None Premorbid Home Care/DME Agencies: Other (Comment) ([place], RW) Transportation available at discharge: daughter or SIL Is the patient able to respond to transportation needs?: Yes In the past 12 months, has lack of transportation kept  you from medical appointments or from getting medications?: No In the past 12 months, has lack of transportation kept you from meetings, work, or from getting things needed for daily living?: No Resource referrals recommended: Neuropsychology  Discharge Planning Living Arrangements: Children Support Systems: Children Type of Residence: Private residence Insurance Resources: Multimedia programmer (specify) Financial Resources: Family Support Financial Screen Referred: No Living Expenses: Lives with family Money Management: Patient Home Management: Independent Patient/Family Preliminary Plans: Daughter or SIl able to assist if needed Care Coordinator Barriers to Discharge: Lack of/limited family support, Decreased caregiver support, Home environment access/layout Care Coordinator Anticipated Follow Up Needs: HH/OP Expected length of stay: 5-7 Days  Clinical Impression Sw met with patient., introduced self and explained role. Patient anticipates discharging home with his daughter and SIL. Patient not opposed to staying the full LOS vs going home before Christmas. Sw will discuss with patient family, no additional questions or concerns. Patient will require ramp and family education   Dyanne Iha 07/30/2022, 2:17 PM

## 2022-07-30 NOTE — Progress Notes (Signed)
Inpatient Rehabilitation  Patient information reviewed and entered into eRehab system by Sanav Remer M. Amman Bartel, M.A., CCC/SLP, PPS Coordinator.  Information including medical coding, functional ability and quality indicators will be reviewed and updated through discharge.    

## 2022-07-30 NOTE — Progress Notes (Signed)
[facility] Individual Statement of Services  Patient Name:  Oscar [name].  Date:  07/30/2022  Welcome to the [facility].  Our goal is to provide you with an individualized program based on your diagnosis and situation, designed to meet your specific needs.  With this comprehensive rehabilitation program, you will be expected to participate in at least 3 hours of rehabilitation therapies Monday-Friday, with modified therapy programming on the weekends.  Your rehabilitation program will include the following services:  Physical Therapy (PT), Occupational Therapy (OT), Speech Therapy (ST), 24 hour per day rehabilitation nursing, Therapeutic Recreaction (TR), Neuropsychology, Care Coordinator, Rehabilitation Medicine, Nutrition Services, Pharmacy Services, and Other  Weekly team conferences will be held on Wednesdays to discuss your progress.  Your Inpatient Rehabilitation Care Coordinator will talk with you frequently to get your input and to update you on team discussions.  Team conferences with you and your family in attendance may also be held.  Expected length of stay: 5-7 Days  Overall anticipated outcome:  Supervision-Min A  Depending on your progress and recovery, your program may change. Your Inpatient Rehabilitation Care Coordinator will coordinate services and will keep you informed of any changes. Your Inpatient Rehabilitation Care Coordinator's name and contact numbers are listed  below.  The following services may also be recommended but are not provided by the [facility]:   [place] will be made to provide these services after discharge if needed.  Arrangements include referral to agencies that provide these services.  Your insurance has been verified to be:  Medicare A & B Your primary doctor is:  Okey Dupre, MD  Pertinent  information will be shared with your doctor and your insurance company.  Inpatient Rehabilitation Care Coordinator:  Erlene Quan, [place] or [phone]  Information discussed with and copy given to patient by: Dyanne Iha, 07/30/2022, 9:14 AM

## 2022-07-31 DIAGNOSIS — I10 Essential (primary) hypertension: Secondary | ICD-10-CM | POA: Diagnosis not present

## 2022-07-31 DIAGNOSIS — I5032 Chronic diastolic (congestive) heart failure: Secondary | ICD-10-CM | POA: Diagnosis not present

## 2022-07-31 DIAGNOSIS — E1169 Type 2 diabetes mellitus with other specified complication: Secondary | ICD-10-CM | POA: Diagnosis not present

## 2022-07-31 DIAGNOSIS — M869 Osteomyelitis, unspecified: Secondary | ICD-10-CM | POA: Diagnosis not present

## 2022-07-31 LAB — GLUCOSE, CAPILLARY
Glucose-Capillary: 128 mg/dL — ABNORMAL HIGH (ref 70–99)
Glucose-Capillary: 88 mg/dL (ref 70–99)
Glucose-Capillary: 196 mg/dL — ABNORMAL HIGH (ref 70–99)
Glucose-Capillary: 130 mg/dL — ABNORMAL HIGH (ref 70–99)

## 2022-07-31 MED ORDER — LEVEMIR 100 UNIT/ML ~~LOC~~ SOLN: 10.0000 [IU] | Freq: Every day | SUBCUTANEOUS | 5 refills | Status: AC

## 2022-07-31 NOTE — Progress Notes (Incomplete)
Inpatient Rehabilitation Discharge Medication Review by a Pharmacist  A complete drug regimen review was completed for this patient to identify any potential clinically significant medication issues.  High Risk Drug Classes Is patient taking? Indication by Medication  Antipsychotic No   Anticoagulant Yes Apixaban-afib  Antibiotic Yes Doxycyline-osteomyelitis  Opioid Yes Oxycodone-prn pain  Antiplatelet No   Hypoglycemics/insulin Yes Jardiance, aspart,levemir- T2DM  Vasoactive Medication Yes Lasix-fluid Flomax-BPH Diltiazem-afib, bp  Chemotherapy No   Other Yes Trazodone-prn sleep Allopurinol-gout Wellbutrin, lexapro-mood Donepezil-dementia Simvastatin-HLD Potassium supplement Indocin-gout flare ups Trental-claudication     Type of Medication Issue Identified Description of Issue Recommendation(s)  Drug Interaction(s) (clinically significant)     Duplicate Therapy     Allergy     No Medication Administration End Date     Incorrect Dose     Additional Drug Therapy Needed     Significant med changes from prior encounter (inform family/care partners about these prior to discharge).    Other       Clinically significant medication issues were identified that warrant physician communication and completion of prescribed/recommended actions by midnight of the next day:  No  Name of provider notified for urgent issues identified:   Provider Method of Notification:   Pharmacist comments:   Time spent performing this drug regimen review (minutes):  [place], PharmD. [facility] Acute Care PGY-1 07/31/2022 7:38 AM

## 2022-07-31 NOTE — Progress Notes (Signed)
Patient using home CPAP unit independently.

## 2022-07-31 NOTE — Progress Notes (Signed)
Patient ID: Oscar Hoobler., male   DOB: 07/31/1946, 76 y.o.   MRN: [id number]  Pt and daughter would like to move up his discharge date and pt wants to be home by Christmas. Team is feeling as long as family can provide the assist he needs then they are good with it. Both daughter and son in-law are nurses and can do the care pt will require. Discussed with pt and Oscar [name]-daughter his equipment needs and have made referral to [employer] for wheelchair, transfer board and bariatric drop-arm bedside commode. Asked if preference for [facility] both had none preferred. Still awaiting team and MD approval for exact discharge date. Will follow up with daughter once know date.

## 2022-07-31 NOTE — Progress Notes (Signed)
Occupational Therapy Session Note  Patient Details  Name: Oscar [name]. MRN: [id number] Date of Birth: 1946/05/15  Today's Date: 07/31/2022 OT Individual Time: 1340-1410 OT Individual Time Calculation (min): 30 min   Short Term Goals: Week 1:  OT Short Term Goal 1 (Week 1): STG = LTG due to ELOS  Skilled Therapeutic Interventions/Progress Updates:    Pt greeted seated in wc with daughter present. Daughter with questions regarding ramp and requested information from case manager. Sent message to CM via secure chat. Pt wanted to don pants over shorts. Educated on modified technique for threading LLE first. He needed OT assist to get pants over off-loading shoe. Sit<>stand from wc with mod A and cues to maintain TDWB LLE. Pt then needed OT assist to pull pants up over hips as he was unable to safely remove unilateral UE support to assist. Pt brought down to therapy gym in wc for time management. Pt spent time with our therapy dog working on trunk control to reach forward and pet dog. Pt participated in fine motor ornament painting activity with min A to keep stencil on ornament. Pt handoff to PT for next therapy session.   Therapy Documentation Precautions:  Precautions Precautions: Fall Precaution Comments: Left transmetatarsal amputation, wound vac L foot Required Braces or Orthoses: Other Brace Other Brace: post op shoe on LLE when OOB Restrictions Weight Bearing Restrictions: Yes LLE Weight Bearing: Touchdown weight bearing  Pain:  Pt reports LLE is slightly tender, but no number given. Rest and repositioned.    Therapy/Group: Individual Therapy  Valma Cava 07/31/2022, 2:39 PM

## 2022-07-31 NOTE — Progress Notes (Signed)
Physical Therapy Session Note  Patient Details  Name: Oscar [name]. MRN: [id number] Date of Birth: 13-Jul-1946  Today's Date: 07/31/2022 PT Individual Time: [phone] and 0539-7673 PT Individual Time Calculation (min): 70 min and 42 min  Short Term Goals: Week 1:  PT Short Term Goal 1 (Week 1): STG=LTG due to LOS  Skilled Therapeutic Interventions/Progress Updates:   Treatment Session 1 Received pt slouched down in bed with RN checking vitals. Pt agreeable to PT treatment and denied any pain during session. Session with emphasis on functional mobility/transfers, generalized strengthening and endurance, dynamic standing balance/coordination, and simulated car transfers. Pt transferred semi-reclined<>sitting EOB with HOB elevated and use of bedrails with supervision. Donned R tennis shoe and L post op shoe with max A - pt able to recall LLE TDWB precautions. Stood x 3 reps from elevated EOB with RW and max A fading to mod A with decent adherence to LLE TDWB precautions. Pt relies on momentum and with tendency to push up with BUE on RW, but unable to safely pivot on RLE. Therefore performed slideboard transfer bed<>WC with CGA and total A to place board. Pt sat in [name] at sink and brushed teeth and washed face with set up assist. Doffed dirty shirt and donned clean one with set up assist.   Pt then instructed in WC mobility 118f using BUE and supervision to ortho gym - total A to get WC through doorway. Noted skin tear on L lateral forearm and increased irritation on R forearm from rubbing on WC - placed ace wrap on armrest for comfort/cushioning and cleaned wound/placed dressing. Pt then performed simulated car transfer via slideboard with CGA overall (total A to place and remove slideboard). Pt reported having access to [place]- suggested being picked up on [place] due to lower seat height and ease for slideboard transfers.   Pt then performed BUE strengthening on UBE at level 5  alternating 1 minute forwards and 1 minute backwards for 6 minutes with emphasis on UE strength and endurance. Pt transferred on/off mat via slideboard with CGA and worked on sit<>stands x 4 with RW and mod fading to min A from slightly elevated EOM - continued reliance on momentum and pushing up with BUEs. Worked on dynamic standing balance tossing horseshoes with RUE and min A for balance x 4 trials - pt reported not putting much weight on LLE but pt appeared to be placing some weight through L heel. Disccused getting ramp placed for D/C as pt reports daughter has 4 STE with only 1 handrail. Pt suggested using SPC on steps - educated pt on safety concerns with this technique with LLE TDWB precautions and pt verbalized understanding. Pt performed WC mobility 1026fusing BUE and supervision back to room and concluded session with pt sitting in WC, needs within reach, and seatbelt alarm on.   Treatment Session 2 Received pt sitting in WC in dayroom. Pt agreeable to PT treatment and denied any pain during session. Returned to room to get pt's RW and pt's daughter present. Discussed D/C plan as pt requesting to return home before [place]. Pt's daughter verbalized confidence feeling comfortable having pt D/C at a WC level with slideboard. Retreived pt from dayroom and demonstrated slideboard transfer to daughter. Educated on proper technique - step 1: lock brakes, step 2: remove armrests and place board, and step 3: technique for transfer. Pt able to perform slideboard transfer with CGA (assist to place and remove board) with good adherance to  LLE TDWB precautions. Pt then performed x 3 sit<>stands with RW from EOB with min A overall and cues for adherence to LLE TDWB precautions, however pt still unable to pivot. Pt's daughter plans to get ramp installed ASAP; secure chatted MD and CSW to discuss potential D/C of 12/24. Pt voided in urinal with mod I and concluded session with pt sitting in WC, needs within  reach, and seatbelt alarm on.   Therapy Documentation Precautions:  Precautions Precautions: Fall Precaution Comments: Left transmetatarsal amputation, wound vac L foot Required Braces or Orthoses: Other Brace Other Brace: post op shoe on LLE when OOB Restrictions Weight Bearing Restrictions: Yes LLE Weight Bearing: Touchdown weight bearing  Therapy/Group: Individual Therapy Alfonse Alpers PT, DPT  07/31/2022, 6:58 AM

## 2022-07-31 NOTE — IPOC Note (Signed)
Overall Plan of Care [facility]) Patient Details Name: Oscar [name]. MRN: [id number] DOB: 01/12/1946  Admitting Diagnosis: Osteomyelitis of left lower extremity [facility])  Hospital Problems: Principal Problem:   Osteomyelitis of left lower extremity ([place])     Functional Problem List: Nursing Bowel, Bladder, Pain, Safety, Endurance, Medication Management, Skin Integrity  PT Balance, Edema, Endurance, Motor, Nutrition, Perception, Skin Integrity  OT Balance, Endurance, Pain, Motor  SLP    TR         Basic ADL's: OT Bathing, Dressing, Toileting     Advanced  ADL's: OT       Transfers: PT Bed Mobility, Bed to Chair, Car, Manufacturing systems engineer, Metallurgist: PT Ambulation, Emergency planning/management officer, Stairs     Additional Impairments: OT None  SLP        TR      Anticipated Outcomes Item Anticipated Outcome  Self Feeding Independent  Swallowing      Basic self-care  Mod I to supervision  Toileting  Mod I   Bathroom Transfers Mod I to supervision  Bowel/Bladder  manage w mod I assist  Transfers  Mod I  Locomotion  min A with LRAD  Communication     Cognition     Pain  n/a  Safety/Judgment  manage safety w cues   Therapy Plan: PT Intensity: Minimum of 1-2 x/day ,45 to 90 minutes PT Frequency: 5 out of 7 days PT Duration Estimated Length of Stay: 7-10 days OT Intensity: Minimum of 1-2 x/day, 45 to 90 minutes OT Frequency: 5 out of 7 days OT Duration/Estimated Length of Stay: 7-10 days     Team Interventions: Nursing Interventions Patient/Family Education, Disease Management/Prevention, Discharge Planning, Bladder Management, Bowel Management, Medication Management  PT interventions Ambulation/gait training, Community reintegration, DME/adaptive equipment instruction, Neuromuscular re-education, Psychosocial support, Stair training, UE/LE Strength taining/ROM, Wheelchair propulsion/positioning, Training and development officer, Discharge planning, Pain  management, Skin care/wound management, Therapeutic Activities, UE/LE Coordination activities, Cognitive remediation/compensation, Disease management/prevention, Functional mobility training, Patient/family education, Splinting/orthotics, Therapeutic Exercise  OT Interventions Balance/vestibular training, Discharge planning, Pain management, Self Care/advanced ADL retraining, Therapeutic Activities, UE/LE Coordination activities, Disease mangement/prevention, Functional mobility training, Patient/family education, Skin care/wound managment, Therapeutic Exercise, DME/adaptive equipment instruction, Psychosocial support, UE/LE Strength taining/ROM, Wheelchair propulsion/positioning  SLP Interventions    TR Interventions    SW/CM Interventions Discharge Planning, Psychosocial Support, Patient/Family Education, Disease Management/Prevention   Barriers to Discharge MD  Medical stability  Nursing Home environment access/layout, Decreased caregiver support 2 level main B+B 4ste right w son  PT Inaccessible home environment, Decreased caregiver support, Home environment access/layout, Wound Care, Weight, Weight bearing restrictions 4 STE with 1 handrail, daughter works and takes care of children during the day, LLE TDWB restrictions  OT Home environment access/layout, Wound Care, Weight bearing restrictions, Weight    SLP      SW Lack of/limited family support, Decreased caregiver support, Home environment access/layout     Team Discharge Planning: Destination: PT-Home ,OT- Home , SLP-  Projected Follow-up: PT-Home health PT, OT-  Home health OT, SLP-  Projected Equipment Needs: PT-To be determined, OT- Other (comment), SLP-  Equipment Details: PT- , OT-bariatric drop arm BSC for slideboard transfers Patient/family involved in discharge planning: PT- Patient,  OT-Patient, SLP-   MD ELOS: 7-10 days Medical Rehab Prognosis:  Excellent Assessment: The patient has been admitted for CIR therapies with  the diagnosis of osteomyelitis of left foot s/p tma. The team will be addressing functional mobility, strength, stamina, balance,  safety, adaptive techniques and equipment, self-care, bowel and bladder mgt, patient and caregiver education, pain mgt, wound care, wb precautions, community reentry. Goals have been set at mod I for mobility and self-care. Anticipated discharge destination is home.        See Team Conference Notes for weekly updates to the plan of care

## 2022-07-31 NOTE — Progress Notes (Signed)
PROGRESS NOTE   Subjective/Complaints: No new issues this morning. Pain is controlled. Felt therapy went well yesterday  ROS: Patient denies fever, rash, sore throat, blurred vision, dizziness, nausea, vomiting, diarrhea, cough, shortness of breath or chest pain, joint or back/neck pain, headache, or mood change.     Objective:   No results found. Recent Labs    07/30/22 0519  WBC 8.3  HGB 11.5*  HCT 36.6*  PLT 278   Recent Labs    07/30/22 0519  NA 139  K 3.7  CL 101  CO2 28  GLUCOSE 161*  BUN 40*  CREATININE 1.55*  CALCIUM 9.2    Intake/Output Summary (Last 24 hours) at 07/31/2022 0816 Last data filed at 07/31/2022 0100 Gross per 24 hour  Intake 240 ml  Output 1100 ml  Net -860 ml        Physical Exam: Vital Signs Blood pressure 113/65, pulse 73, temperature 97.9 F (36.6 C), resp. rate 16, height '5\' 9"'$  (1.753 m), weight 116.1 kg, SpO2 95 %.  Constitutional: No distress . Vital signs reviewed. HEENT: NCAT, EOMI, oral membranes moist Neck: supple Cardiovascular: RRR without murmur. No JVD    Respiratory/Chest: CTA Bilaterally without wheezes or rales. Normal effort    GI/Abdomen: BS +, non-tender, non-distended Ext: no clubbing, cyanosis, or edema Psych: pleasant and cooperative  Skin: left TMA incision CDI with scab, dried blood adhered to gauze. 2-3 1.5" areas of ishemic appearing superficial tisse on dorsum of foot. Neuro:  Alert and oriented x 3. Normal insight and awareness. Intact Memory. Normal language and speech. Cranial nerve exam unremarkable. Motor 5/5 UE, RLE 4-5/5. LLE 3-4/5. Sl decreased LT below ankles. DTR's 1+ Musculoskeletal: left foot tender to touch, mild edema present.     Assessment/Plan: 1. Functional deficits which require 3+ hours per day of interdisciplinary therapy in a comprehensive inpatient rehab setting. Physiatrist is providing close team supervision and 24 hour  management of active medical problems listed below. Physiatrist and rehab team continue to assess barriers to discharge/monitor patient progress toward functional and medical goals  Care Tool:  Bathing    Body parts bathed by patient: Right arm, Left arm, Chest, Abdomen, Front perineal area, Right upper leg, Left upper leg, Face   Body parts bathed by helper: Buttocks, Right lower leg, Left lower leg     Bathing assist Assist Level: Moderate Assistance - Patient 50 - 74%     Upper Body Dressing/Undressing Upper body dressing   What is the patient wearing?: Pull over shirt    Upper body assist Assist Level: Set up assist    Lower Body Dressing/Undressing Lower body dressing      What is the patient wearing?: Pants     Lower body assist Assist for lower body dressing: Moderate Assistance - Patient 50 - 74%     Toileting Toileting    Toileting assist Assist for toileting: Moderate Assistance - Patient 50 - 74%     Transfers Chair/bed transfer  Transfers assist     Chair/bed transfer assist level: Moderate Assistance - Patient 50 - 74% (slideboard)     Locomotion Ambulation   Ambulation assist   Ambulation activity did  not occur: Safety/medical concerns (fatigue, LLE TDWB precautions, R knee discomfort)          Walk 10 feet activity   Assist  Walk 10 feet activity did not occur: Safety/medical concerns (fatigue, LLE TDWB precautions, R knee discomfort)        Walk 50 feet activity   Assist Walk 50 feet with 2 turns activity did not occur: Safety/medical concerns (fatigue, LLE TDWB precautions, R knee discomfort)         Walk 150 feet activity   Assist Walk 150 feet activity did not occur: Safety/medical concerns (fatigue, LLE TDWB precautions, R knee discomfort)         Walk 10 feet on uneven surface  activity   Assist Walk 10 feet on uneven surfaces activity did not occur: Safety/medical concerns (fatigue, LLE TDWB precautions, R  knee discomfort)         Wheelchair     Assist Is the patient using a wheelchair?: Yes Type of Wheelchair: Manual Wheelchair activity did not occur: Safety/medical concerns (fatigue)         Wheelchair 50 feet with 2 turns activity    Assist    Wheelchair 50 feet with 2 turns activity did not occur: Safety/medical concerns (fatigue)       Wheelchair 150 feet activity     Assist  Wheelchair 150 feet activity did not occur: Safety/medical concerns (fatigue)       Blood pressure 113/65, pulse 73, temperature 97.9 F (36.6 C), resp. rate 16, height '5\' 9"'$  (1.753 m), weight 116.1 kg, SpO2 95 %.  Medical Problem List and Plan: 1. Functional deficits secondary to debility secondary to left foot osteomyelitis s/p left TMA             -patient may shower if foot/vac kept dry             -ELOS/Goals: 5-7 days, supervision to min assist with mobility and self-care  -Continue CIR therapies including PT, OT   2.  Antithrombotics: -DVT/anticoagulation:  Pharmaceutical: Eliquis             -antiplatelet therapy: none     3. Pain Management: Tylenol, Robaxin, oxycodone as needed             -Voltaren prn right knee and elbow pain 4. Mood/Behavior/Sleep: LCSW to evaluate and provide emotional support             -antipsychotic agents: n/a             -continue Wellbutrin SR 150 mg q AM, Lexapro 40 mg q AM             -melatonin prn for sleep 5. Neuropsych/cognition: This patient is capable of making decisions on his own behalf.   6. Skin/Wound Care: routine skin care checks             -would use non-adherent dressing, kerlix, and ACE   7. Fluids/Electrolytes/Nutrition: strict Is and Os and follow-up chemistries             -carb modified diet   -glucerna supplements for protein 8: Left TMA: continue dressing as above   9: DM: CBGs q AC and q HS; A1c= 7.5             -continue SSI             -Levemir 20 units q HS             -Novolog increase to 6 units TID  with meals; hold if eats less than 50% of meals             -Jardiance 25 mg daily   CBG (last 3)  Recent Labs    07/30/22 1636 07/30/22 2109 07/31/22 0614  GLUCAP 132* 123* 128*    12/21 good control 10: Chronic diastolic CHF: daily weight             -continue diltiazem 360 mg daily             -continue Lasix 80 mg daily   -12/20 bp borderline. observe 11: Atrial fibrillation:             -continue Eliquis   12: Hypertension:              -continue diltiazem 360 mg daily -continue Lasix 80 mg daily  -12/21 bp under control 13: OSA: wears CPAP at night but inconsistently   14: CKD: baseline creatinine 2.1-2.3; follow-up BMP   12/20 Cr 1.55--actually below range 15: Prior CVA with cognitive impairment: continue Aricept 10 mg q evening   16: Hypokalemia: continue supplementation and follow-up BMP   17: Hyperlipidemia: continue simvastatin   18: Urinary hesitancy/frequency: continue Flomax 0.4 mg daily             -PVR's low             -pt has had some flow issues post-operatively which appear to be improving 19: Gout, history of: resume allopurinol 300 mg q AM            20 Post-op anemia: hgb with sl drop to 11.5  -fe+ supp  -diet  LOS: 2 days A FACE TO FACE EVALUATION WAS PERFORMED  Meredith Staggers 07/31/2022, 8:16 AM

## 2022-07-31 NOTE — Progress Notes (Signed)
Occupational Therapy Session Note  Patient Details  Name: Oscar [name]. MRN: [id number] Date of Birth: Oct 09, 1945  Today's Date: 07/31/2022 OT Individual Time: 0768-0881 OT Individual Time Calculation (min): 70 min    Short Term Goals: Week 1:  OT Short Term Goal 1 (Week 1): STG = LTG due to ELOS  Skilled Therapeutic Interventions/Progress Updates: Patient seen for UE strengthening and dynamic balance tasks with weighted exercise ball to strengthen and challenge. Worked on functional reaching to strengthen BUE's into shoulder flexion/overhead reaching, Biceps and core strength and stability. Worked on functional transfer training with patient directing own assist in preparation for discharge. Patient able to give steps for slideboard transfer with minimal cuing for safety and more thorough instruction. Patient able to perform scooting to slight incline moving to the right with only min assist. Core strength and stability for improved transfers and to assist in maintaining weight bearing restriction. Return to w/c with cues, continuing to focus on patient directing his care. Returned to room to use urinal. Set-up assist.     Therapy Documentation Precautions:  Precautions Precautions: Fall Precaution Comments: Left transmetatarsal amputation, wound vac L foot Required Braces or Orthoses: Other Brace Other Brace: post op shoe on LLE when OOB Restrictions Weight Bearing Restrictions: Yes LLE Weight Bearing: Touchdown weight bearing General:   Vital Signs: Therapy Vitals Temp: 97.8 F (36.6 C) Temp Source: Oral Pulse Rate: 77 Resp: 18 BP: 113/63 Patient Position (if appropriate): Lying Oxygen Therapy SpO2: 96 % O2 Device: Room Air Pain: Pain Assessment Pain Scale: 0-10 Pain Score: 0-No pain    Balance- good dynamic sitting balance woring on functional reach   Exercises:    Therapy/Group: Individual Therapy  Hermina Barters 07/31/2022, 12:00 PM

## 2022-07-31 NOTE — Discharge Instructions (Addendum)
Inpatient Rehab Discharge Instructions  Oscar [name]. Discharge date and time: 08/03/22   Activities/Precautions/ Functional Status: Activity: no lifting, driving, or strenuous exercise for till cleared by MD Diet: cardiac diet and diabetic diet Wound Care:  keep wound clean and dry.  Cover with stump shrinker.   Functional status:  ___ No restrictions     ___ Walk up steps independently ___ 24/7 supervision/assistance   ___ Walk up steps with assistance ___ Intermittent supervision/assistance  ___ Bathe/dress independently ___ Walk with walker     ___ Bathe/dress with assistance ___ Walk Independently    ___ Shower independently ___ Walk with assistance    ___ Shower with assistance ___ No alcohol     ___ Return to work/school ________  Special Instructions:    COMMUNITY REFERRALS UPON DISCHARGE:    Home Health:   PT  & OT                   Agency: [facility]   Medical Equipment/Items Ordered: bariatric drop-arm bedside commode and 22' wheelchair along with 30' slide board                                                 Agency/Supplier:[facility] [employer]  [phone]    My questions have been answered and I understand these instructions. I will adhere to these goals and the provided educational materials after my discharge from the hospital.  Patient/Caregiver Signature _______________________________ Date __________  Clinician Signature _______________________________________ Date __________  Please bring this form and your medication list with you to all your follow-up doctor's appointments.

## 2022-08-01 DIAGNOSIS — M869 Osteomyelitis, unspecified: Secondary | ICD-10-CM | POA: Diagnosis not present

## 2022-08-01 LAB — GLUCOSE, CAPILLARY
Glucose-Capillary: 141 mg/dL — ABNORMAL HIGH (ref 70–99)
Glucose-Capillary: 93 mg/dL (ref 70–99)
Glucose-Capillary: 186 mg/dL — ABNORMAL HIGH (ref 70–99)
Glucose-Capillary: 130 mg/dL — ABNORMAL HIGH (ref 70–99)

## 2022-08-01 NOTE — Progress Notes (Signed)
PROGRESS NOTE   Subjective/Complaints: No new complaints this morning Discussed elevated CBGs, advised trying to follow low added sugar diet Plan for d/c 12/24  ROS: Patient denies fever, rash, sore throat, blurred vision, dizziness, nausea, vomiting, diarrhea, cough, shortness of breath or chest pain, joint or back/neck pain, headache, or mood change.     Objective:   No results found. Recent Labs    07/30/22 0519  WBC 8.3  HGB 11.5*  HCT 36.6*  PLT 278   Recent Labs    07/30/22 0519  NA 139  K 3.7  CL 101  CO2 28  GLUCOSE 161*  BUN 40*  CREATININE 1.55*  CALCIUM 9.2    Intake/Output Summary (Last 24 hours) at 08/01/2022 1802 Last data filed at 08/01/2022 1717 Gross per 24 hour  Intake 775 ml  Output 1475 ml  Net -700 ml        Physical Exam: Vital Signs Blood pressure 122/75, pulse 91, temperature 99.2 F (37.3 C), temperature source Oral, resp. rate 17, height '5\' 9"'$  (1.753 m), weight 116.3 kg, SpO2 96 %.  Constitutional: No distress . Vital signs reviewed. BMI 37.86 HEENT: NCAT, EOMI, oral membranes moist Neck: supple Cardiovascular: RRR without murmur. No JVD    Respiratory/Chest: CTA Bilaterally without wheezes or rales. Normal effort    GI/Abdomen: BS +, non-tender, non-distended Ext: no clubbing, cyanosis, or edema Psych: pleasant and cooperative  Skin: left TMA incision CDI with scab, dried blood adhered to gauze. 2-3 1.5" areas of ishemic appearing superficial tisse on dorsum of foot. Neuro:  Alert and oriented x 3. Normal insight and awareness. Intact Memory. Normal language and speech. Cranial nerve exam unremarkable. Motor 5/5 UE, RLE 4-5/5. LLE 3-4/5. Sl decreased LT below ankles. DTR's 1+ Musculoskeletal: left foot tender to touch, mild edema present.     Assessment/Plan: 1. Functional deficits which require 3+ hours per day of interdisciplinary therapy in a comprehensive inpatient  rehab setting. Physiatrist is providing close team supervision and 24 hour management of active medical problems listed below. Physiatrist and rehab team continue to assess barriers to discharge/monitor patient progress toward functional and medical goals  Care Tool:  Bathing    Body parts bathed by patient: Right arm, Left arm, Chest, Abdomen, Front perineal area, Right upper leg, Left upper leg, Face   Body parts bathed by helper: Buttocks, Right lower leg, Left lower leg     Bathing assist Assist Level: Moderate Assistance - Patient 50 - 74%     Upper Body Dressing/Undressing Upper body dressing   What is the patient wearing?: Pull over shirt    Upper body assist Assist Level: Set up assist    Lower Body Dressing/Undressing Lower body dressing      What is the patient wearing?: Pants     Lower body assist Assist for lower body dressing: Moderate Assistance - Patient 50 - 74%     Toileting Toileting    Toileting assist Assist for toileting: Set up assist Assistive Device Comment: set up for use of urinal   Transfers Chair/bed transfer  Transfers assist  Chair/bed transfer activity did not occur: Safety/medical concerns  Chair/bed transfer assist level: Contact Guard/Touching  assist (slideboard)     Locomotion Ambulation   Ambulation assist   Ambulation activity did not occur: Safety/medical concerns (fatigue, LLE TDWB precautions, R knee discomfort)          Walk 10 feet activity   Assist  Walk 10 feet activity did not occur: Safety/medical concerns (fatigue, LLE TDWB precautions, R knee discomfort)        Walk 50 feet activity   Assist Walk 50 feet with 2 turns activity did not occur: Safety/medical concerns (fatigue, LLE TDWB precautions, R knee discomfort)         Walk 150 feet activity   Assist Walk 150 feet activity did not occur: Safety/medical concerns (fatigue, LLE TDWB precautions, R knee discomfort)         Walk 10 feet  on uneven surface  activity   Assist Walk 10 feet on uneven surfaces activity did not occur: Safety/medical concerns (fatigue, LLE TDWB precautions, R knee discomfort)         Wheelchair     Assist Is the patient using a wheelchair?: Yes Type of Wheelchair: Manual Wheelchair activity did not occur: Safety/medical concerns (fatigue)  Wheelchair assist level: Supervision/Verbal cueing Max wheelchair distance: 158f    Wheelchair 50 feet with 2 turns activity    Assist    Wheelchair 50 feet with 2 turns activity did not occur: Safety/medical concerns (fatigue)   Assist Level: Supervision/Verbal cueing   Wheelchair 150 feet activity     Assist  Wheelchair 150 feet activity did not occur: Safety/medical concerns (fatigue)       Blood pressure 122/75, pulse 91, temperature 99.2 F (37.3 C), temperature source Oral, resp. rate 17, height '5\' 9"'$  (1.753 m), weight 116.3 kg, SpO2 96 %.  Medical Problem List and Plan: 1. Functional deficits secondary to debility secondary to left foot osteomyelitis s/p left TMA             -patient may shower if foot/vac kept dry             -ELOS/Goals: 5-7 days, supervision to min assist with mobility and self-care  -Continue CIR therapies including PT, OT   2.  Antithrombotics: -DVT/anticoagulation:  Pharmaceutical: Eliquis             -antiplatelet therapy: none     3. Pain Management: continue Tylenol, Robaxin, oxycodone as needed.              -Voltaren prn right knee and elbow pain 4. Depression             -antipsychotic agents: n/a             -continue Wellbutrin SR 150 mg q AM, Lexapro 40 mg q AM             -melatonin prn for sleep 5. Neuropsych/cognition: This patient is capable of making decisions on his own behalf.   6. Skin/Wound Care: routine skin care checks             -would use non-adherent dressing, kerlix, and ACE   7. Fluids/Electrolytes/Nutrition: strict Is and Os and follow-up chemistries              -carb modified diet   -glucerna supplements for protein 8: Left TMA: continue dressing as above   9: DM: CBGs q AC and q HS; A1c= 7.5             -continue SSI             -  Levemir 20 units q HS             -Novolog increase to 6 units TID with meals; hold if eats less than 50% of meals             -Jardiance 25 mg daily  -recommended avoiding foods with added sugar   CBG (last 3)  Recent Labs    08/01/22 0603 08/01/22 1150 08/01/22 1635  GLUCAP 141* 93 186*    12/21 good control 10: Chronic diastolic CHF: daily weight             -continue diltiazem 360 mg daily             -continue Lasix 80 mg daily   -12/20 bp borderline. observe 11: Atrial fibrillation:             -continue Eliquis   12: Hypertension:              -continue diltiazem 360 mg daily -continue Lasix 80 mg daily  -12/21 bp under control 13: OSA: wears CPAP at night but inconsistently   14: CKD: baseline creatinine 2.1-2.3; follow-up BMP   12/20 Cr 1.55--actually below range 15: Prior CVA with cognitive impairment: continue Aricept 10 mg q evening   16: Hypokalemia: continue supplementation and follow-up BMP   17: Hyperlipidemia: continue simvastatin   18: Urinary hesitancy/frequency: continue Flomax 0.4 mg daily             -PVR's low             -pt has had some flow issues post-operatively which appear to be improving 19: Gout, history of: resume allopurinol 300 mg q AM            20 Post-op anemia: hgb with sl drop to 11.5  -fe+ supp  -diet  LOS: 3 days A FACE TO FACE EVALUATION WAS PERFORMED  Martha Clan P Efrain Clauson 08/01/2022, 6:02 PM

## 2022-08-01 NOTE — Progress Notes (Signed)
Occupational Therapy Session Note  Patient Details  Name: Oscar [name]. MRN: [id number] Date of Birth: Sep 03, 1945  Today's Date: 08/01/2022 OT Individual Time: 0800-0900 & 1694-5038 OT Individual Time Calculation (min): 60 min & 72 min   Short Term Goals: Week 1:  OT Short Term Goal 1 (Week 1): STG = LTG due to ELOS  Skilled Therapeutic Interventions/Progress Updates:  Session 1 Skilled OT intervention completed with focus on d/c planning, toilet and other functional transfers, anterior weight shifting, sequencing/reaction speed all to promote skills needed for independence with BADLs. Pt received upright in bed, agreeable to session. No pain reported.  Pt did not remember his DC date being moved up, with time spent at start of session discussing DME (wide BSC for toileting) recommendations.   Transitioned to EOB with heavy use of bed rails with HOB elevated about 45 degrees, with encouragement to practice getting in/out of standard bed as this is what he will have at his daughter-then his own home eventually. Donned R shoe with min A for laces, DARCO on LLE with total A.  Completed toilet transfers for practice to determine CLOF in prep for DC, with pt able to place board with supervision/cues for proper position, then initial CGA transfer > R to wide drop arm BSC fading to supervision with cues for technique and board stability. Pt requested to use urinal for void with pt able to do so with set up A. Transfer from [facility] > R to w/c with CGA for w/c stability. Improved TDWB adherence during transfer on LLE.   Transported dependently in w/c <> gym for time. Seated at [facility], pt completed the following to promote anterior weight shifting and cognitive strategies needed for transfers/ADLs:  Speed dot reaction test -1st trial- 2.48 sec reaction time, 11 misses -2nd trial- 2.45 sec reaction time, 2 misses Trail making test Trail making A- 0 errors, 2:18 to complete; no cues to  complete Trail making B- 10 errors, 9:16 to complete: mod cues for problem solving and remembering his starting point/the sequence  Completed 2.5 x 2 mins on SCIFIT in forwards then backwards position on level 8 for cardiovascular and BUE endurance.  Pt remained seated in w/c with belt alarm on, and with all needs in reach at end of session.  Session 2 Skilled OT intervention completed with focus on functional transfers and ADL retraining within a shower context. Pt received seated in w/c, agreeable to session. No pain reported.  Pt agreeable to shower as he won't be able to due to accessibility reasons at DC. Transported dependently in w/c into bathroom to tub bench. Able to laterally scoot/squat pivot with min A using grab bars and mod cues for TDWB on LLE during transfer. Doffed pants at seated level with min A. Therapist applied waterproof cover to LLE prior to shower.   Able to bathe at the min A level for RLE, all at the seated level- would benefit from use of LH sponge. Cues needed for accessing all parts and use of lateral lean for posterior peri-area. Min A squat pivot/lateral scoot to w/c.   Total A placement of board under R hip, then CGA transfer to EOB though could be supervision if it weren't for being bare bottomed and the added friction. Seated EOB pt needed min A for LB dressing for LLE threading then able to donn over hips at bed level due to recommendation of w/c level ADLs at DC and body habitus challenging pt's lateral leaning for donning pants.  Deo, shirt and shaving with electric razor, all with set up A at EOB.  DME delivered to room, with therapist reviewing with pt what each piece of equipment was for.  Seated EOB, pt completed the following exercises to promote strength needed for functional transfers and ADL management: (With green theraband) x12 reps Horizontal abduction Self-anchored shoulder flexion each arm Self-anchored bicep flexion each arm Shoulder external  rotation Shoulder diagonal pulls  Pt transitioned back into supine with max cues needed for use of bed rails to pull self up into sitting to then scoot bottom towards [facility] with pt able to do at supervision level. Pt remained upright in bed, with fresh drink per request, bed alarm on and all needs in reach at end of session.  Therapy Documentation Precautions:  Precautions Precautions: Fall Precaution Comments: Left transmetatarsal amputation, wound vac L foot Required Braces or Orthoses: Other Brace Other Brace: post op shoe on LLE when OOB Restrictions Weight Bearing Restrictions: Yes LLE Weight Bearing: Touchdown weight bearing    Therapy/Group: Individual Therapy  Blase Mess, MS, OTR/L  08/01/2022, 2:16 PM

## 2022-08-01 NOTE — Progress Notes (Signed)
Occupational Therapy Discharge Summary  Patient Details  Name: Oscar [name]. MRN: [id number] Date of Birth: 1945-11-30  Date of Discharge from [place] service:August 02, 2022  Patient has met 0 of 6 long term goals due to family request of pt discharging sooner than recommended ELOS therefore did not meet any LTGs. Patient to discharge at [facility] Assist wheelchair ADL level. Patient's daughter and son-in-law are both independent (and registered nurses) to provide the necessary physical and cognitive assistance at discharge and have participated in family education with OT in prep for discharge.    Reasons goals not met: Did not meet any LTGs due to expedited DC therefore unable meet set goals at the mod I level   Recommendation:  Patient will benefit from ongoing skilled OT services in home health setting to continue to advance functional skills in the area of BADL and Reduce care partner burden.  Equipment: Wide drop arm BSC  Reasons for discharge: treatment goals met and discharge from hospital  Patient/family agrees with progress made and goals achieved: Yes  OT Discharge Precautions/Restrictions  Precautions Precautions: Fall Precaution Comments: Left transmetatarsal amputation Required Braces or Orthoses: Other Brace Other Brace: post op shoe on LLE when OOB Restrictions Weight Bearing Restrictions: Yes LLE Weight Bearing: Touchdown weight bearing ADL ADL Eating: Independent Where Assessed-Eating: Wheelchair Grooming: Setup Where Assessed-Grooming: Sitting at sink Upper Body Bathing: Setup Where Assessed-Upper Body Bathing: Shower Lower Body Bathing: Minimal assistance Where Assessed-Lower Body Bathing: Shower Upper Body Dressing: Setup Where Assessed-Upper Body Dressing: Edge of bed Lower Body Dressing: Minimal assistance Where Assessed-Lower Body Dressing: Edge of bed, Bed level Toileting: Minimal assistance Where Assessed-Toileting: Bedside Commode Toilet  Transfer: Therapist, music Method: Theatre manager: Extra wide drop arm bedside commode Tub/Shower Transfer: Unable to assess Tub/Shower Transfer Method: Unable to assess Social research officer, government: Minimal assistance Social research officer, government Method: Other (comment), Squat pivot (lateral scoot) Youth worker: Radio broadcast assistant, Grab bars Vision Baseline Vision/History: 1 Wears glasses Patient Visual Report: No change from baseline Vision Assessment?: No apparent visual deficits Perception  Perception: Within Functional Limits Praxis Praxis: Intact Cognition Cognition Overall Cognitive Status: History of cognitive impairments - at baseline Arousal/Alertness: Awake/alert Orientation Level: Person;Place;Situation Person: Oriented Place: Oriented Situation: Oriented Memory: Appears intact Awareness: Appears intact Problem Solving: Impaired Safety/Judgment: Appears intact Brief Interview for Mental Status (BIMS) Repetition of Three Words (First Attempt): 3 Temporal Orientation: Year: Correct Temporal Orientation: Month: Accurate within 5 days Temporal Orientation: Day: Incorrect Recall: "Sock": Yes, no cue required Recall: "Blue": Yes, no cue required Recall: "Bed": Yes, no cue required BIMS Summary Score: 14 Sensation Sensation Light Touch: Impaired by gross assessment Hot/Cold: Not tested Proprioception: Appears Intact Stereognosis: Not tested Additional Comments: has peripheral neuropathy at baseline but BUE intact Coordination Gross Motor Movements are Fluid and Coordinated: No Fine Motor Movements are Fluid and Coordinated: No Coordination and Movement Description: grossly uncoordinated due to altered balance strategies 2/2 LLE TDWB precautions Finger Nose Finger Test: dysmetria Motor  Motor Motor: Abnormal postural alignment and control Motor - Skilled Clinical Observations: uncoordinated due to altered balance  strategies 2/2 LLE TDWB precautions Mobility     Trunk/Postural Assessment  Cervical Assessment Cervical Assessment: Exceptions to [facility] (forward head) Thoracic Assessment Thoracic Assessment: Exceptions to [facility] (rounded shoulders) Lumbar Assessment Lumbar Assessment: Exceptions to [facility] (anterior pelvic tilt) Postural Control Postural Control: Deficits on evaluation Protective Responses: delayed in standing due to altered balance strategies 2/2 LLE TDWB precautions  Balance  Balance Balance Assessed: Yes Static Sitting Balance Static Sitting - Balance Support: Feet supported;Bilateral upper extremity supported Static Sitting - Level of Assistance: 6: Modified independent (Device/Increase time) Dynamic Sitting Balance Dynamic Sitting - Balance Support: Feet supported;No upper extremity supported Dynamic Sitting - Level of Assistance: 6: Modified independent (Device/Increase time) Static Standing Balance Static Standing - Balance Support: Bilateral upper extremity supported (RW) Static Standing - Level of Assistance: 5: Stand by assistance (CGA) Extremity/Trunk Assessment RUE Assessment RUE Assessment: Exceptions to [facility] Active Range of Motion (AROM) Comments: WFL General Strength Comments: 4-/5 grossly with history of elbow tendinitis LUE Assessment LUE Assessment: Exceptions to [facility] Active Range of Motion (AROM) Comments: [facility] General Strength Comments: 4-/5 grossly   Hope E Kluttz 08/01/2022, 7:50 AM

## 2022-08-01 NOTE — Progress Notes (Signed)
Physical Therapy Session Note  Patient Details  Name: Oscar [name]. MRN: [id number] Date of Birth: May 15, 1946  Today's Date: 08/01/2022 PT Individual Time:10000-1115   75 min   Short Term Goals: Week 1:  PT Short Term Goal 1 (Week 1): STG=LTG due to LOS   Skilled Therapeutic Interventions/Progress Updates:   Pt received sitting in WC and agreeable to PT. WC mobility through hall without assist x 116f and increased time. [facility] transfer to and from mat table x 4 with set and progressing to supervision assist with pt positioning slide board. Sit<>stand fom 24 inch mat table x 5 with min assist and able to maintain NWB on the LLE; 28.8 sec.bed mobility with increased time and supervision assist from PT. Supine therex: SLR, bridge through bolster, SAQ, clam shell all performed x 10. Seated therex with green tband x10 each BLE, LAQ, HS flexion, hip abduction, hip flexion. [facility]<>[facility] with min assist but WB noted siting balance EOB with out assist from PT while engaged in christmas sing along through LLE. [facility] returned to [facility] and [facility] and all needs met.        Therapy Documentation Precautions:  Precautions Precautions: Fall Precaution Comments: Left transmetatarsal amputation Required Braces or Orthoses: Other Brace Other Brace: post op shoe on LLE when OOB Restrictions Weight Bearing Restrictions: Yes LLE Weight Bearing: Touchdown weight bearing  Vital Signs: Therapy Vitals Temp: 98.1 F (36.7 C) Temp Source: Oral Pulse Rate: 66 Resp: 16 BP: 104/79 Patient Position (if appropriate): Lying Oxygen Therapy SpO2: 95 % O2 Device: Room Air Pain: denies   Therapy/Group: Individual Therapy  ALorie Phenix12/22/2023, 8:06 AM

## 2022-08-01 NOTE — Progress Notes (Signed)
Inpatient Rehabilitation Care Coordinator Discharge Note DC SUNDAY 12/24  Patient Details  Name: Oscar [name]. MRN: [id number] Date of Birth: Aug 18, 1945   Discharge location: [place] ALSO TO ASSIST 24/7  Length of Stay: 5 DAYS  Discharge activity level: MIN [place]  Home/community participation: ACTIVE  Patient response YO:MAYOKH Literacy - How often do you need to have someone help you when you read instructions, pamphlets, or other written material from your doctor or pharmacy?: Never  Patient response TX:HFSFSE Isolation - How often do you feel lonely or isolated from those around you?: Rarely  Services provided included: MD, RD, PT, OT, RN, CM, TR, Pharmacy, SW  Financial Services:  Financial Services Utilized: Medicare    Choices offered to/list presented to: PT AND DAUGHTER  Follow-up services arranged:  Home Health, DME, Patient/Family has no preference for HH/DME agencies Oscar [name]: [place]    DME : [facility], [address] [place]    Patient response to transportation need: Is the patient able to respond to transportation needs?: Yes In the past 12 months, has lack of transportation kept you from medical appointments or from getting medications?: No In the past 12 months, has lack of transportation kept you from meetings, work, or from getting things needed for daily living?: No    Comments (or additional information):PT REQUESTED [place] ARE BOTH RN'S AND FEEL CAN [place]  Patient/Family verbalized understanding of follow-up arrangements:  Yes  Individual responsible for coordination of the follow-up plan: [facility] [phone]  Confirmed correct DME delivered: Oscar [name] 08/01/2022    Oscar [name], Oscar [name]

## 2022-08-01 NOTE — Progress Notes (Addendum)
Patient ID: Oscar Tamburrino., male   DOB: Feb 25, 1946, 76 y.o.   MRN: [id number] MD feels pt will be medically ready for discharge 12/24. Have contacted daughter-Oscar [name] to make aware she reports portable ramp should be here 12/23. Hopefully the equipment will be delivered today and have found [employer] to cover.  3:15 PM Pt's equipment has been delivered and is in his room. Pt aware to have daughter when here to take it home with her. Pt feels good about going home this weekend.

## 2022-08-01 NOTE — Plan of Care (Signed)
  Problem: RH Ambulation Goal: LTG Patient will ambulate in controlled environment (PT) Description: LTG: Patient will ambulate in a controlled environment, # of feet with assistance (PT). Outcome: Not Applicable Flowsheets (Taken 08/01/2022 1339) LTG: Pt will ambulate in controlled environ  assist needed:: (D/C) -- Note: D/C Goal: LTG Patient will ambulate in home environment (PT) Description: LTG: Patient will ambulate in home environment, # of feet with assistance (PT). Outcome: Not Applicable Flowsheets (Taken 08/01/2022 1339) LTG: Pt will ambulate in home environ  assist needed:: (D/C) -- Note: D/C

## 2022-08-01 NOTE — Progress Notes (Signed)
Physical Therapy Discharge Summary  Patient Details  Name: Oscar [name]. MRN: [id number] Date of Birth: August 24, 1945  Date of Discharge from PT service:August 02, 2022  {CHL IP REHAB PT TIME CALCULATION:[id number]}  Patient has met {NUMBERS 0-12:18577} of 7 long term goals due to improved activity tolerance, improved balance, improved postural control, increased strength, ability to compensate for deficits, improved awareness, and improved coordination. Patient to discharge at a wheelchair level Supervision using slideboard. Patient's care partner is independent to provide the necessary physical and cognitive assistance at discharge. Pt's daughter present briefly on 12/21 to observe technique for slideboard transfers and verbalized confidence with ability to care for pt at a [facility] level upon discharge.   Reasons goals not met: Pt insisting on discharging home before Christmas. Pt's daughter in agreement and verbalized ability to care for pt at [place].   Recommendation:  Patient will benefit from ongoing skilled PT services in home health setting to continue to advance safe functional mobility, address ongoing impairments in transfers, generalized strengthening and endurance, dynamic standing balance/coordination, and to minimize fall risk.  Equipment: 22x18 manual WC with standard legrests, slideboard - daughter to get ramp installed  Reasons for discharge: discharge from hospital - pt requesting to D/C ASAP  Patient/family agrees with progress made and goals achieved: Yes  PT Discharge Precautions/Restrictions Precautions Precautions: Fall Precaution Comments: Left transmetatarsal amputation Required Braces or Orthoses: Other Brace Other Brace: post op shoe on LLE when OOB Restrictions Weight Bearing Restrictions: Yes LLE Weight Bearing: Touchdown weight bearing Pain Interference   Cognition Overall Cognitive Status: History of cognitive impairments - at  baseline Arousal/Alertness: Awake/alert Orientation Level: Oriented X4 Memory: Impaired Awareness: Appears intact Problem Solving: Impaired Safety/Judgment: Appears intact Comments: short term memory deficits per daughter Sensation   Motor  Motor Motor: Abnormal postural alignment and control Motor - Skilled Clinical Observations: uncoordinated due to altered balance strategies 2/2 LLE TDWB precautions  Mobility   Locomotion     Trunk/Postural Assessment  Cervical Assessment Cervical Assessment: Exceptions to [facility] (forward head) Thoracic Assessment Thoracic Assessment: Exceptions to [facility] (rounded shoulders) Lumbar Assessment Lumbar Assessment: Exceptions to [facility] (anterior pelvic tilt) Postural Control Postural Control: Deficits on evaluation Protective Responses: delayed in standing due to altered balance strategies 2/2 LLE TDWB precautions  Balance Balance Balance Assessed: Yes Static Sitting Balance Static Sitting - Balance Support: Feet supported;Bilateral upper extremity supported Static Sitting - Level of Assistance: 6: Modified independent (Device/Increase time) Dynamic Sitting Balance Dynamic Sitting - Balance Support: Feet supported;No upper extremity supported Dynamic Sitting - Level of Assistance: 6: Modified independent (Device/Increase time) Static Standing Balance Static Standing - Balance Support: Bilateral upper extremity supported (RW) Static Standing - Level of Assistance: 5: Stand by assistance (CGA) Extremity Assessment        [place] PT, DPT  08/01/2022, 1:36 PM

## 2022-08-02 DIAGNOSIS — M869 Osteomyelitis, unspecified: Secondary | ICD-10-CM | POA: Diagnosis not present

## 2022-08-02 LAB — GLUCOSE, CAPILLARY
Glucose-Capillary: 133 mg/dL — ABNORMAL HIGH (ref 70–99)
Glucose-Capillary: 127 mg/dL — ABNORMAL HIGH (ref 70–99)
Glucose-Capillary: 144 mg/dL — ABNORMAL HIGH (ref 70–99)
Glucose-Capillary: 157 mg/dL — ABNORMAL HIGH (ref 70–99)

## 2022-08-02 NOTE — Progress Notes (Signed)
Physical Therapy Session Note  Patient Details  Name: Keyante Durio. MRN: [id number] Date of Birth: [birth year]  Today's Date: 08/02/2022 PT Individual Time: 0921-1009 PT Individual Time Calculation (min): 48 min   Short Term Goals: Week 1:  PT Short Term Goal 1 (Week 1): STG=LTG due to LOS Week 2:    Week 3:     Skilled Therapeutic Interventions/Progress Updates:   Pt received supine in bed and agreeable to PT. Supine>sit transfer with *** assist and ***cues   Donning shoes.   Slide board.   WC mobility.   Sit<>stand in rw and parallel bars.   Therex. And review of HEP.   Patient returned to room and left sitting in [facility] with call bell in reach and all needs met.        Therapy Documentation Precautions:  Precautions Precautions: Fall Precaution Comments: Left transmetatarsal amputation Required Braces or Orthoses: Other Brace Other Brace: post op shoe on LLE when OOB Restrictions Weight Bearing Restrictions: Yes LLE Weight Bearing: Touchdown weight bearing General:   Vital Signs:   Pain:   Mobility:   Locomotion :    Trunk/Postural Assessment :    Balance:   Exercises:   Other Treatments:      Therapy/Group: Individual Therapy  Lorie Phenix 08/02/2022, 10:10 AM

## 2022-08-02 NOTE — Progress Notes (Signed)
Occupational Therapy Session Note  Patient Details  Name: Oscar [name]. MRN: [id number] Date of Birth: [birth year]  Today's Date: 08/02/2022 OT Individual Time: 1015-1100 OT Individual Time Calculation (min): 45 min    Short Term Goals: Week 1:  OT Short Term Goal 1 (Week 1): STG = LTG due to ELOS  Skilled Therapeutic Interventions/Progress Updates:    Pt received in wc stating he was dressed and ready for the day.  Reviewed his discharge plans for tomorrow and clarified the level of support he will be having at home.  Pt agreeable to working on strengthening exercises. Pt transported to gym and used slide board with close S (A for set up) to mat. Reviewed with pt safe board placement.  On mat pt used green theraband for various UE exercises and alternated those with LE AROM exercises.   Pt tolerated exercises well.  Pt used board to transfer back to wheelchair with supervision.  Pt self propelled back to his room - pt in room with all needs met.   Therapy Documentation Precautions:  Precautions Precautions: Fall Precaution Comments: Left transmetatarsal amputation Required Braces or Orthoses: Other Brace Other Brace: post op shoe on LLE when OOB Restrictions Weight Bearing Restrictions: Yes LLE Weight Bearing: Touchdown weight bearing    Pain: Pain Assessment Pain Score: 0-No pain     Therapy/Group: Individual Therapy  Shereka Lafortune 08/02/2022, 12:56 PM

## 2022-08-02 NOTE — Plan of Care (Signed)
  Problem: Sit to Stand Goal: LTG:  Patient will perform sit to stand in prep for activites of daily living with assistance level (OT) Description: LTG:  Patient will perform sit to stand in prep for activites of daily living with assistance level (OT) Outcome: Completed/Met

## 2022-08-02 NOTE — Plan of Care (Signed)
  Problem: RH Bathing Goal: LTG Patient will bathe all body parts with assist levels (OT) Description: LTG: Patient will bathe all body parts with assist levels (OT) Outcome: Not Met (add Reason) Note: Pt requires min A and pt and family have opted to discharge home early, therefore time did not allow for goals to be met.    Problem: RH Dressing Goal: LTG Patient will perform lower body dressing w/assist (OT) Description: LTG: Patient will perform lower body dressing with assist, with/without cues in positioning using equipment (OT) Outcome: Not Met (add Reason) Note: Pt requires min A and pt and family have opted to discharge home early, therefore time did not allow for goals to be met.    Problem: RH Toileting Goal: LTG Patient will perform toileting task (3/3 steps) with assistance level (OT) Description: LTG: Patient will perform toileting task (3/3 steps) with assistance level (OT)  Outcome: Not Met (add Reason) Note: Pt requires min  A and pt and family have opted to discharge home early, therefore time did not allow for goals to be met.    Problem: RH Toilet Transfers Goal: LTG Patient will perform toilet transfers w/assist (OT) Description: LTG: Patient will perform toilet transfers with assist, with/without cues using equipment (OT) Outcome: Not Met (add Reason) Note: Pt requires S and pt and family have opted to discharge home early, therefore time did not allow for goals to be met.    Problem: RH Tub/Shower Transfers Goal: LTG Patient will perform tub/shower transfers w/assist (OT) Description: LTG: Patient will perform tub/shower transfers with assist, with/without cues using equipment (OT) Outcome: Not Met (add Reason) Note: Pt requires min A and pt and family have opted to discharge home early, therefore time did not allow for goals to be met.

## 2022-08-02 NOTE — Progress Notes (Addendum)
PROGRESS NOTE   Subjective/Complaints: No new complaints this morning Excited to go home tomorrow  ROS: Patient denies fever, rash, sore throat, blurred vision, dizziness, nausea, vomiting, diarrhea, cough, shortness of breath or chest pain, joint or back/neck pain, headache, or mood change.     Objective:   No results found. No results for input(s): "WBC", "HGB", "HCT", "PLT" in the last 72 hours.  No results for input(s): "NA", "K", "CL", "CO2", "GLUCOSE", "BUN", "CREATININE", "CALCIUM" in the last 72 hours.   Intake/Output Summary (Last 24 hours) at 08/02/2022 1525 Last data filed at 08/02/2022 0901 Gross per 24 hour  Intake 654 ml  Output 1550 ml  Net -896 ml        Physical Exam: Vital Signs Blood pressure 127/67, pulse (!) 59, temperature 97.7 F (36.5 C), temperature source Oral, resp. rate 15, height '5\' 9"'$  (1.753 m), weight 116.3 kg, SpO2 99 %.  Constitutional: No distress . Vital signs reviewed. BMI 37.86 HEENT: NCAT, EOMI, oral membranes moist Neck: supple Cardiovascular: Bradycardia Respiratory/Chest: CTA Bilaterally without wheezes or rales. Normal effort    GI/Abdomen: BS +, non-tender, non-distended Ext: no clubbing, cyanosis, or edema Psych: pleasant and cooperative  Skin: left TMA incision CDI with scab, dried blood adhered to gauze. 2-3 1.5" areas of ishemic appearing superficial tisse on dorsum of foot. Neuro:  Alert and oriented x 3. Normal insight and awareness. Intact Memory. Normal language and speech. Cranial nerve exam unremarkable. Motor 5/5 UE, RLE 4-5/5. LLE 3-4/5. Sl decreased LT below ankles. DTR's 1+ Musculoskeletal: left foot tender to touch, mild edema present.     Assessment/Plan: 1. Functional deficits which require 3+ hours per day of interdisciplinary therapy in a comprehensive inpatient rehab setting. Physiatrist is providing close team supervision and 24 hour management of  active medical problems listed below. Physiatrist and rehab team continue to assess barriers to discharge/monitor patient progress toward functional and medical goals  Care Tool:  Bathing    Body parts bathed by patient: Right arm, Left arm, Chest, Abdomen, Front perineal area, Right upper leg, Left upper leg, Face, Buttocks   Body parts bathed by helper: Right lower leg, Left lower leg     Bathing assist Assist Level: Minimal Assistance - Patient > 75%     Upper Body Dressing/Undressing Upper body dressing   What is the patient wearing?: Pull over shirt    Upper body assist Assist Level: Independent    Lower Body Dressing/Undressing Lower body dressing      What is the patient wearing?: Pants     Lower body assist Assist for lower body dressing: Minimal Assistance - Patient > 75%     Toileting Toileting    Toileting assist Assist for toileting: Minimal Assistance - Patient > 75% Assistive Device Comment: set up for use of urinal   Transfers Chair/bed transfer  Transfers assist  Chair/bed transfer activity did not occur: Safety/medical concerns  Chair/bed transfer assist level: Supervision/Verbal cueing     Locomotion Ambulation   Ambulation assist   Ambulation activity did not occur: Safety/medical concerns          Walk 10 feet activity   Assist  Walk 10 feet  activity did not occur: Safety/medical concerns        Walk 50 feet activity   Assist Walk 50 feet with 2 turns activity did not occur: Safety/medical concerns         Walk 150 feet activity   Assist Walk 150 feet activity did not occur: Safety/medical concerns         Walk 10 feet on uneven surface  activity   Assist Walk 10 feet on uneven surfaces activity did not occur: Safety/medical concerns         Wheelchair     Assist Is the patient using a wheelchair?: Yes Type of Wheelchair: Manual Wheelchair activity did not occur: Safety/medical concerns  (fatigue)  Wheelchair assist level: Independent Max wheelchair distance: 150    Wheelchair 50 feet with 2 turns activity    Assist    Wheelchair 50 feet with 2 turns activity did not occur: Safety/medical concerns (fatigue)   Assist Level: Independent   Wheelchair 150 feet activity     Assist  Wheelchair 150 feet activity did not occur: Safety/medical concerns (fatigue)   Assist Level: Independent   Blood pressure 127/67, pulse (!) 59, temperature 97.7 F (36.5 C), temperature source Oral, resp. rate 15, height '5\' 9"'$  (1.753 m), weight 116.3 kg, SpO2 99 %.  Medical Problem List and Plan: 1. Functional deficits secondary to debility secondary to left foot osteomyelitis s/p left TMA             -patient may shower if foot/vac kept dry             -ELOS/Goals: 5-7 days, supervision to min assist with mobility and self-care  -Discussed can d/c tomorrow as soon as ready 2.  Antithrombotics: -DVT/anticoagulation:  Pharmaceutical: Eliquis             -antiplatelet therapy: none     3. Pain Management: continue Tylenol, Robaxin, oxycodone as needed.              -Voltaren prn right knee and elbow pain 4. Depression             -antipsychotic agents: n/a             -continue Wellbutrin SR 150 mg q AM, Lexapro 40 mg q AM             -melatonin prn for sleep 5. Neuropsych/cognition: This patient is capable of making decisions on his own behalf.   6. Skin/Wound Care: routine skin care checks             -would use non-adherent dressing, kerlix, and ACE   7. Fluids/Electrolytes/Nutrition: strict Is and Os and follow-up chemistries             -carb modified diet   -glucerna supplements for protein 8: Left TMA: continue dressing as above   9: DM: CBGs q AC and q HS; A1c= 7.5             -continue SSI             -Levemir 20 units q HS             -Novolog increase to 6 units TID with meals; hold if eats less than 50% of meals             -Jardiance 25 mg  daily  -recommended avoiding foods with added sugar   CBG (last 3)  Recent Labs    08/01/22 2126 08/02/22 0630 08/02/22 1149  GLUCAP 130*  133* 127*    12/21 good control 10: Chronic diastolic CHF: daily weight             -continue diltiazem 360 mg daily             -continue Lasix 80 mg daily   -12/20 bp borderline. observe 11: Atrial fibrillation:             -continue Eliquis   12: Hypertension:              -continue diltiazem 360 mg daily -continue Lasix 80 mg daily  -12/21 bp under control 13: OSA: wears CPAP at night but inconsistently   14: CKD: baseline creatinine 2.1-2.3; follow-up BMP   12/20 Cr 1.55--actually below range 15: Prior CVA with cognitive impairment: continue Aricept 10 mg q evening   16: Hypokalemia: continue supplementation and follow-up BMP   17: Hyperlipidemia: continue simvastatin   18: Urinary hesitancy/frequency: continue Flomax 0.4 mg daily             -PVR's low             -pt has had some flow issues post-operatively which appear to be improving 19: Gout, history of: continue allopurinol 300 mg q AM            20 Post-op anemia: hgb with sl drop to 11.5  -continue fe+ supp  -diet 21. Bradycardia: continue to monitor HR TID  LOS: 4 days A FACE TO FACE EVALUATION WAS PERFORMED  Oscar [name] 08/02/2022, 3:25 PM

## 2022-08-02 NOTE — Progress Notes (Signed)
Home unit CPAP ready @ bedside. Pt stated he will place on himself when ready.

## 2022-08-03 DIAGNOSIS — M869 Osteomyelitis, unspecified: Secondary | ICD-10-CM | POA: Diagnosis not present

## 2022-08-03 LAB — GLUCOSE, CAPILLARY: Glucose-Capillary: 112 mg/dL — ABNORMAL HIGH (ref 70–99)

## 2022-08-03 NOTE — Progress Notes (Signed)
PROGRESS NOTE   Subjective/Complaints: Used home CPAP Stable for d/c today  ROS: Patient denies fever, rash, sore throat, blurred vision, dizziness, nausea, vomiting, diarrhea, cough, shortness of breath or chest pain, joint or back/neck pain, headache, or mood change.     Objective:   No results found. No results for input(s): "WBC", "HGB", "HCT", "PLT" in the last 72 hours.  No results for input(s): "NA", "K", "CL", "CO2", "GLUCOSE", "BUN", "CREATININE", "CALCIUM" in the last 72 hours.   Intake/Output Summary (Last 24 hours) at 08/03/2022 0849 Last data filed at 08/03/2022 0829 Gross per 24 hour  Intake 840 ml  Output 1650 ml  Net -810 ml        Physical Exam: Vital Signs Blood pressure (!) 118/50, pulse 66, temperature 97.9 F (36.6 C), temperature source Oral, resp. rate 20, height '5\' 9"'$  (1.753 m), weight 118.7 kg, SpO2 95 %.  Constitutional: No distress . Vital signs reviewed. BMI 38.64 HEENT: NCAT, EOMI, oral membranes moist Neck: supple Cardiovascular: Bradycardia Respiratory/Chest: CTA Bilaterally without wheezes or rales. Normal effort    GI/Abdomen: BS +, non-tender, non-distended Ext: no clubbing, cyanosis, or edema Psych: pleasant and cooperative  Skin: left TMA incision CDI with scab, dried blood adhered to gauze. 2-3 1.5" areas of ishemic appearing superficial tisse on dorsum of foot. Neuro:  Alert and oriented x 3. Normal insight and awareness. Intact Memory. Normal language and speech. Cranial nerve exam unremarkable. Motor 5/5 UE, RLE 4-5/5. LLE 3-4/5. Sl decreased LT below ankles. DTR's 1+ Musculoskeletal: left foot tender to touch, mild edema present.     Assessment/Plan: 1. Functional deficits which require 3+ hours per day of interdisciplinary therapy in a comprehensive inpatient rehab setting. Physiatrist is providing close team supervision and 24 hour management of active medical problems  listed below. Physiatrist and rehab team continue to assess barriers to discharge/monitor patient progress toward functional and medical goals  Care Tool:  Bathing    Body parts bathed by patient: Right arm, Left arm, Chest, Abdomen, Front perineal area, Right upper leg, Left upper leg, Face, Buttocks   Body parts bathed by helper: Right lower leg, Left lower leg     Bathing assist Assist Level: Minimal Assistance - Patient > 75%     Upper Body Dressing/Undressing Upper body dressing   What is the patient wearing?: Pull over shirt    Upper body assist Assist Level: Independent    Lower Body Dressing/Undressing Lower body dressing      What is the patient wearing?: Pants     Lower body assist Assist for lower body dressing: Minimal Assistance - Patient > 75%     Toileting Toileting    Toileting assist Assist for toileting: Minimal Assistance - Patient > 75% Assistive Device Comment: set up for use of urinal   Transfers Chair/bed transfer  Transfers assist  Chair/bed transfer activity did not occur: Safety/medical concerns  Chair/bed transfer assist level: Supervision/Verbal cueing     Locomotion Ambulation   Ambulation assist   Ambulation activity did not occur: Safety/medical concerns (WB precautions, weakness, fatigue)          Walk 10 feet activity   Assist  Walk 10  feet activity did not occur: Safety/medical concerns (WB precautions, weakness, fatigue)        Walk 50 feet activity   Assist Walk 50 feet with 2 turns activity did not occur: Safety/medical concerns (WB precautions, weakness, fatigue)         Walk 150 feet activity   Assist Walk 150 feet activity did not occur: Safety/medical concerns (WB precautions, weakness, fatigue)         Walk 10 feet on uneven surface  activity   Assist Walk 10 feet on uneven surfaces activity did not occur: Safety/medical concerns (WB precautions, weakness, fatigue)          Wheelchair     Assist Is the patient using a wheelchair?: Yes Type of Wheelchair: Manual Wheelchair activity did not occur: Safety/medical concerns (fatigue)  Wheelchair assist level: Independent Max wheelchair distance: 150    Wheelchair 50 feet with 2 turns activity    Assist    Wheelchair 50 feet with 2 turns activity did not occur: Safety/medical concerns (fatigue)   Assist Level: Independent   Wheelchair 150 feet activity     Assist  Wheelchair 150 feet activity did not occur: Safety/medical concerns (fatigue)   Assist Level: Independent   Blood pressure (!) 118/50, pulse 66, temperature 97.9 F (36.6 C), temperature source Oral, resp. rate 20, height '5\' 9"'$  (1.753 m), weight 118.7 kg, SpO2 95 %.  Medical Problem List and Plan: 1. Functional deficits secondary to debility secondary to left foot osteomyelitis s/p left TMA             -patient may shower if foot/vac kept dry             -ELOS/Goals: 5-7 days, supervision to min assist with mobility and self-care  -d/c home today 2.  Antithrombotics: -DVT/anticoagulation:  Pharmaceutical: Eliquis             -antiplatelet therapy: none     3. Pain Management: continue Tylenol, Robaxin, oxycodone as needed.              -Voltaren prn right knee and elbow pain 4. Depression             -antipsychotic agents: n/a             -continue Wellbutrin SR 150 mg q AM, Lexapro 40 mg q AM             -melatonin prn for sleep 5. Neuropsych/cognition: This patient is capable of making decisions on his own behalf.   6. Skin/Wound Care: routine skin care checks             -would use non-adherent dressing, kerlix, and ACE   7. Fluids/Electrolytes/Nutrition: strict Is and Os and follow-up chemistries             -carb modified diet   -glucerna supplements for protein 8: Left TMA: continue dressing as above   9: DM: CBGs q AC and q HS; A1c= 7.5             -continue SSI             -Levemir 20 units q HS              -Novolog increase to 6 units TID with meals; hold if eats less than 50% of meals             -Jardiance 25 mg daily  -recommended avoiding foods with added sugar   CBG (last 3)  Recent Labs  08/02/22 1633 08/02/22 2122 08/03/22 0719  GLUCAP 144* 157* 112*    12/23 good control 10: Chronic diastolic CHF: daily weight             -continue diltiazem 360 mg daily             -continue Lasix 80 mg daily   -12/23 bp borderline. observe 11: Atrial fibrillation:             -continue Eliquis   12: Hypertension:              -continue diltiazem 360 mg daily -continue Lasix 80 mg daily  -12/21 bp under control 13: OSA: wears CPAP at night but inconsistently   14: CKD: baseline creatinine 2.1-2.3; follow-up BMP   12/20 Cr 1.55--actually below range 15: Prior CVA with cognitive impairment: continue Aricept 10 mg q evening   16: Hypokalemia: continue supplementation and follow-up BMP   17: Hyperlipidemia: continue simvastatin   18: Urinary hesitancy/frequency: continue Flomax 0.4 mg daily             -PVR's low             -pt has had some flow issues post-operatively which appear to be improving 19: Gout, history of: continue allopurinol 300 mg q AM            20 Post-op anemia: hgb with sl drop to 11.5  -continue fe+ supp  -diet 21. Bradycardia: continue to monitor HR TID   >30 minutes spent in discharge of patient including review of medications and follow-up appointments, physical examination, and in answering all patient's questions   LOS: 5 days A FACE TO FACE EVALUATION WAS [place] 08/03/2022, 8:49 AM

## 2022-08-03 NOTE — Progress Notes (Signed)
Inpatient Rehabilitation Discharge Medication Review by a Pharmacist  A complete drug regimen review was completed for this patient to identify any potential clinically significant medication issues.  High Risk Drug Classes Is patient taking? Indication by Medication  Antipsychotic No   Anticoagulant Yes Apixaban-afib  Antibiotic Yes Doxycyline-osteomyelitis  Opioid Yes Oxycodone-prn pain  Antiplatelet No   Hypoglycemics/insulin Yes Jardiance, aspart,levemir- T2DM  Vasoactive Medication Yes Lasix-fluid Flomax-BPH Diltiazem-afib, bp  Chemotherapy No   Other Yes Trazodone-prn sleep Allopurinol-gout Wellbutrin, lexapro-mood Donepezil-dementia Simvastatin-HLD Potassium supplement Indocin-gout flare ups Trental-claudication     Type of Medication Issue Identified Description of Issue Recommendation(s)  Drug Interaction(s) (clinically significant)     Duplicate Therapy     Allergy     No Medication Administration End Date     Incorrect Dose     Additional Drug Therapy Needed     Significant med changes from prior encounter (inform family/care partners about these prior to discharge).    Other       Clinically significant medication issues were identified that warrant physician communication and completion of prescribed/recommended actions by midnight of the next day:  No  Name of provider notified for urgent issues identified:   Provider Method of Notification:   Pharmacist comments:   Time spent performing this drug regimen review (minutes):  [place], PharmD. [facility] Acute Care PGY-1 08/03/2022 10:56 AM

## 2022-08-03 NOTE — Progress Notes (Signed)
Wound care to left foot incision completed at discharge c family present for education. Non adherent pad had dried to suture area resulting in a superficial open area. Sutures intact, no bleeding or infection present. MD assessed. Incision rewrapped per order.

## 2022-08-07 ENCOUNTER — Telehealth: Payer: Self-pay | Admitting: Orthopedic Surgery

## 2022-08-07 ENCOUNTER — Telehealth: Payer: Self-pay

## 2022-08-07 DIAGNOSIS — D631 Anemia in chronic kidney disease: Secondary | ICD-10-CM | POA: Diagnosis not present

## 2022-08-07 DIAGNOSIS — F32A Depression, unspecified: Secondary | ICD-10-CM | POA: Diagnosis not present

## 2022-08-07 DIAGNOSIS — F0393 Unspecified dementia, unspecified severity, with mood disturbance: Secondary | ICD-10-CM | POA: Diagnosis not present

## 2022-08-07 DIAGNOSIS — M1030 Gout due to renal impairment, unspecified site: Secondary | ICD-10-CM | POA: Diagnosis not present

## 2022-08-07 DIAGNOSIS — E875 Hyperkalemia: Secondary | ICD-10-CM | POA: Diagnosis not present

## 2022-08-07 DIAGNOSIS — R350 Frequency of micturition: Secondary | ICD-10-CM | POA: Diagnosis not present

## 2022-08-07 DIAGNOSIS — R3911 Hesitancy of micturition: Secondary | ICD-10-CM | POA: Diagnosis not present

## 2022-08-07 DIAGNOSIS — Z89432 Acquired absence of left foot: Secondary | ICD-10-CM | POA: Diagnosis not present

## 2022-08-07 DIAGNOSIS — E785 Hyperlipidemia, unspecified: Secondary | ICD-10-CM | POA: Diagnosis not present

## 2022-08-07 DIAGNOSIS — E1122 Type 2 diabetes mellitus with diabetic chronic kidney disease: Secondary | ICD-10-CM | POA: Diagnosis not present

## 2022-08-07 DIAGNOSIS — I130 Hypertensive heart and chronic kidney disease with heart failure and stage 1 through stage 4 chronic kidney disease, or unspecified chronic kidney disease: Secondary | ICD-10-CM | POA: Diagnosis not present

## 2022-08-07 DIAGNOSIS — L89896 Pressure-induced deep tissue damage of other site: Secondary | ICD-10-CM | POA: Diagnosis not present

## 2022-08-07 DIAGNOSIS — M778 Other enthesopathies, not elsewhere classified: Secondary | ICD-10-CM | POA: Diagnosis not present

## 2022-08-07 DIAGNOSIS — E11319 Type 2 diabetes mellitus with unspecified diabetic retinopathy without macular edema: Secondary | ICD-10-CM | POA: Diagnosis not present

## 2022-08-07 DIAGNOSIS — G4733 Obstructive sleep apnea (adult) (pediatric): Secondary | ICD-10-CM | POA: Diagnosis not present

## 2022-08-07 DIAGNOSIS — I69318 Other symptoms and signs involving cognitive functions following cerebral infarction: Secondary | ICD-10-CM | POA: Diagnosis not present

## 2022-08-07 DIAGNOSIS — K449 Diaphragmatic hernia without obstruction or gangrene: Secondary | ICD-10-CM | POA: Diagnosis not present

## 2022-08-07 DIAGNOSIS — I4891 Unspecified atrial fibrillation: Secondary | ICD-10-CM | POA: Diagnosis not present

## 2022-08-07 DIAGNOSIS — N189 Chronic kidney disease, unspecified: Secondary | ICD-10-CM | POA: Diagnosis not present

## 2022-08-07 DIAGNOSIS — N401 Enlarged prostate with lower urinary tract symptoms: Secondary | ICD-10-CM | POA: Diagnosis not present

## 2022-08-07 DIAGNOSIS — S83206D Unspecified tear of unspecified meniscus, current injury, right knee, subsequent encounter: Secondary | ICD-10-CM | POA: Diagnosis not present

## 2022-08-07 DIAGNOSIS — M160 Bilateral primary osteoarthritis of hip: Secondary | ICD-10-CM | POA: Diagnosis not present

## 2022-08-07 DIAGNOSIS — H538 Other visual disturbances: Secondary | ICD-10-CM | POA: Diagnosis not present

## 2022-08-07 DIAGNOSIS — I5032 Chronic diastolic (congestive) heart failure: Secondary | ICD-10-CM | POA: Diagnosis not present

## 2022-08-07 DIAGNOSIS — Z4781 Encounter for orthopedic aftercare following surgical amputation: Secondary | ICD-10-CM | POA: Diagnosis not present

## 2022-08-07 NOTE — Telephone Encounter (Signed)
Wound vac taken off on 07/29/22 at hospital. Pt discharged on 08/03/22. Dtr has been doing daily dressing changes and he has been staying off his foot. I have him scheduled for post op appt on 08/12/22 with Dr.duda.

## 2022-08-07 NOTE — Telephone Encounter (Signed)
VM left on triage line from Amy with [facility] about new wound that appeared on patients ankle. They do have an appt next Tuesday but she wanted someone to be aware. No call back left on VM for Amy

## 2022-08-07 NOTE — Telephone Encounter (Signed)
Pt daughter called in stating that her dad recently had surgery on Dec 13 or 19... Pt daughter stated that pt was suppose to have a post op appt after being release from hospital... Pt daughter stated that pt was just release from hospital after christmas... Pt daughter is requesting appt... First avail with Junie Panning is 08/20/2022 and Dr. Sharol Given first avail on 08/28/2022.Marland KitchenMarland Kitchen

## 2022-08-07 NOTE — Discharge Summary (Signed)
Physician Discharge Summary  Patient ID: Oscar [name]. MRN: [id number] DOB/AGE: 12-18-1945 76 y.o.  Admit date: 07/29/2022 Discharge date: 08/03/2022  Discharge Diagnoses:  Principal Problem:   Osteomyelitis of left lower extremity ([place]) Active problems: Debility secondary to LLE osteomyelitis s/p TMA Right knee pain Left elbow pain Insomnia Depression DM Chronic diastolic CHF Atrial fibrillation Hypertension OSA CKD Hypokalemia Hyperlipidemia Urinary hesitancy Gout Anemia   Discharged Condition: good  Labs:  Basic Metabolic Panel:    Latest Ref Rng & Units 07/30/2022    5:19 AM 07/23/2022    8:35 AM 12/26/2021    7:51 AM  CMP  Glucose 70 - 99 mg/dL 161  209  159   BUN 8 - 23 mg/dL 40  50  39   Creatinine 0.61 - 1.24 mg/dL 1.55  2.14  2.35   Sodium 135 - 145 mmol/L 139  136  139   Potassium 3.5 - 5.1 mmol/L 3.7  3.5  4.1   Chloride 98 - 111 mmol/L 101  100  107   CO2 22 - 32 mmol/L _0 Calcium 8.9 - 10.3 mg/dL 9.2  8.9  9.1   Total Protein 6.5 - 8.1 g/dL 7.4     Total Bilirubin 0.3 - 1.2 mg/dL 0.5     Alkaline Phos 38 - 126 U/L 71     AST 15 - 41 U/L 26     ALT 0 - 44 U/L 16        CBC:    Latest Ref Rng & Units 07/30/2022    5:19 AM 07/23/2022    8:35 AM 12/26/2021    7:51 AM  CBC  WBC 4.0 - 10.5 K/uL 8.3  7.3  7.5   Hemoglobin 13.0 - 17.0 g/dL 11.5  13.2  12.7   Hematocrit 39.0 - 52.0 % 36.6  40.0  39.7   Platelets 150 - 400 K/uL 278  204  195     CBG: Recent Labs  Lab 08/02/22 0630 08/02/22 1149 08/02/22 1633 08/02/22 2122 08/03/22 0719  GLUCAP 133* 127* 144* 157* 112*    Brief HPI:   Oscar Vonstein. is a 76 y.o. male with a history of a Wagner grade 1 ulcer beneath the second metatarsal head of the left foot.  He also had a wound on the second toe dorsum of the inner phalangeal joint.  He had been on doxycycline for the last 2 weeks secondary to concern for infection.  He failed conservative measures and elected for  surgical management.  He underwent left transmetatarsal amputation by Dr. Sharol Given on 07/23/2022.  His medical history is also significant for chronic diastolic heart failure followed by Dr. Haroldine Laws, atrial fibrillation on Eliquis, dementia on Aricept, chronic kidney disease, obstructive sleep apnea, or CVA.  He remained hemodynamically stable postoperatively.  Diabetic coordinator was consulted for management of diabetes.  Incisional negative pressure vacuum dressing to continue for one week. Touchdown weightbearing left lower extremity. He is tolerating diabetic diet .  Hospital Course: Oscar [name]. was admitted to rehab 07/29/2022 for inpatient therapies to consist of PT, ST and OT at least three hours five days a week. Past admission physiatrist, therapy team and rehab RN have worked together to provide customized collaborative inpatient rehab. Iron supplement for mild anemia. Prevena dressing removed. Incision healing without signs of infection. Urinary hesitancy improved. Improved creatinine to below baseline. Stable for discharge on 08/03/2022.  Blood pressures were monitored on TID  basis and remained controlled with diltiazem 360 mg and Lasix 80 mg daily.  Diabetes has been monitored with ac/hs CBG checks and SSI was use prn for tighter BS control. Continued Levemir 20 unit q HS as well as Jardiance 25 mg daily and increased Novolog to 6 units with meals.    Rehab course: During patient's stay in rehab weekly team conferences were held to monitor patient's progress, set goals and discuss barriers to discharge. At admission, patient required mod assist with mobility and with basic self-care skills.   He has had improvement in activity tolerance, balance, postural control as well as ability to compensate for deficits. He has had improvement in functional use RUE/LUE  and RLE/LLE as well as improvement in awareness    Disposition: Discharge disposition: 02-Transferred to [facility]      Diet: carb modified  Special Instructions: No driving, alcohol consumption or tobacco use.  30-35 minutes were spent on discharge planning and discharge summary.   Allergies as of 08/03/2022       Reactions   Iohexol Hives, Other (See Comments)    Desc: HIVES S/P 13HR.PREMEDS, ?LOW DOSAGE PREMEDS   Ivp Dye [iodinated Contrast Media] Hives, Other (See Comments)   welps   Sulfamethoxazole-trimethoprim Other (See Comments)   Gi upset        Medication List     TAKE these medications    acetaminophen 500 MG tablet Commonly known as: TYLENOL Take 1,000 mg by mouth 2 (two) times daily as needed for moderate pain, headache, fever or mild pain.   allopurinol 300 MG tablet Commonly known as: ZYLOPRIM Take 150 mg by mouth every morning.   B-D INS SYRINGE 0.5CC/30GX1/2" 30G X 1/2" 0.5 ML Misc Generic drug: Insulin Syringe-Needle U-100 USE THREE TIMES A DAY DX-E11.65 INJECTION   blood glucose meter kit and supplies Kit Dispense based on patient and insurance preference. Use up to four times daily as directed. (FOR ICD-9 250.00, 250.01).   buPROPion 150 MG 12 hr tablet Commonly known as: WELLBUTRIN SR Take 150 mg by mouth every morning.   diltiazem 360 MG 24 hr capsule Commonly known as: CARDIZEM CD Take 1 capsule (360 mg total) by mouth daily.   donepezil 10 MG tablet Commonly known as: ARICEPT Take 1 at bedtime What changed:  how much to take how to take this when to take this additional instructions   doxycycline 100 MG tablet Commonly known as: VIBRA-TABS Take 1 tablet (100 mg total) by mouth 2 (two) times daily.   Eliquis 5 MG Tabs tablet Generic drug: apixaban TAKE 1 TABLET BY MOUTH TWICE A DAY   escitalopram 20 MG tablet Commonly known as: LEXAPRO Take 40 mg by mouth every morning.   furosemide 80 MG tablet Commonly known as: LASIX Take 1 tablet (80 mg total) by mouth daily. T   indomethacin 50 MG capsule Commonly known as:  INDOCIN Take 1 capsule (50 mg total) by mouth 3 (three) times daily as needed (for gout flare ups ). Reported on 12/27/2015   insulin aspart 100 UNIT/ML injection Commonly known as: novoLOG Inject 10-50 Units into the skin 3 (three) times daily as needed for high blood sugar. Sliding scale   Jardiance 25 MG Tabs tablet Generic drug: empagliflozin Take 25 mg by mouth daily.   Levemir 100 UNIT/ML injection Generic drug: insulin detemir Inject 0.1-0.5 mLs (10-50 Units total) into the skin at bedtime. Sliding scale   oxyCODONE-acetaminophen 5-325 MG tablet Commonly known as: PERCOCET/ROXICET Take  1 tablet by mouth every 4 (four) hours as needed.   oxyCODONE-acetaminophen 5-325 MG tablet Commonly known as: PERCOCET/ROXICET Take 1 tablet by mouth every 4 (four) hours as needed.   oxymetazoline 0.05 % nasal spray Commonly known as: AFRIN Place 1 spray into both nostrils daily as needed for congestion.   pentoxifylline 400 MG CR tablet Commonly known as: TRENTAL TAKE 1 TABLET BY MOUTH 3 TIMES DAILY WITH MEALS. What changed: See the new instructions.   potassium chloride SA 20 MEQ tablet Commonly known as: Klor-Con M20 Take 1 tablet (20 mEq total) by mouth 2 (two) times daily.   simvastatin 20 MG tablet Commonly known as: ZOCOR Take 20 mg by mouth every evening.   tamsulosin 0.4 MG Caps capsule Commonly known as: FLOMAX Take 1 capsule (0.4 mg total) by mouth daily after breakfast. What changed: when to take this   Vitamin D 50 MCG (2000 UT) tablet Take 4,000 Units by mouth daily.        Follow-up Information     Kathalene Frames, MD. Call.   Specialty: Internal Medicine Why: Tuesday for post hospital follow up Contact information: [address], [address] [place] [address] [phone]         Izora Ribas, MD Follow up.   Specialty: Physical Medicine and Rehabilitation Why: office will call you with follow up appointment Contact  information: [address] [place] [address] [phone]         Newt Minion, MD. Call.   Specialty: Orthopedic Surgery Why: Tuesday for post op appointment Contact information: [place] [address] [phone]                 Signed: Barbie Banner 08/07/2022, 11:20 AM

## 2022-08-08 ENCOUNTER — Telehealth: Payer: Self-pay | Admitting: Orthopedic Surgery

## 2022-08-08 NOTE — Telephone Encounter (Signed)
amy  from [facility]  home health ([phone] wants to know if Dr Sharol Given will sign and  for supervise for home health patient

## 2022-08-08 NOTE — Telephone Encounter (Signed)
Noted  

## 2022-08-08 NOTE — Telephone Encounter (Signed)
Amy informed okay for [facility] orders.

## 2022-08-12 ENCOUNTER — Encounter: Payer: Self-pay | Admitting: Orthopedic Surgery

## 2022-08-12 ENCOUNTER — Ambulatory Visit (INDEPENDENT_AMBULATORY_CARE_PROVIDER_SITE_OTHER): Payer: Medicare Other | Admitting: Orthopedic Surgery

## 2022-08-12 DIAGNOSIS — Z89432 Acquired absence of left foot: Secondary | ICD-10-CM

## 2022-08-12 NOTE — Progress Notes (Signed)
Office Visit Note   Patient: Oscar [name].           Date of Birth: 01-Oct-1945           MRN: [id number] Visit Date: 08/12/2022              Requested by: Kathalene Frames, MD [address], [place],  [place] [address] PCP: Kathalene Frames, MD  Chief Complaint  Patient presents with   Left Foot - Routine Post Op    07/23/22 left transmet amputation      HPI: Patient is a 77 year old gentleman who is 3 weeks status post left transmetatarsal amputation with Kerecis tissue graft reinforcement.  Patient states that he has a sore over the ankle secondary rubbing on his postoperative shoe.  Assessment & Plan: Visit Diagnoses:  1. History of transmetatarsal amputation of left foot ([place])     Plan: Recommended patient advance to his regular sneakers he may begin with minimal weightbearing check his foot to ensure the wound does not have any complications.  Wash with soap and water plus dry dressing.  Prescription provided for [employer] for custom orthotic spacer and carbon plate.  Follow-Up Instructions: Return in about 2 weeks (around 08/26/2022).   Ortho Exam  Patient is alert, oriented, no adenopathy, well-dressed, normal affect, normal respiratory effort. Examination patient has some superficial abrasions over the anterior ankle and medial ankle.  There is also some superficial abrasions over the posterior heel.  The surgical incision is well-healed.  We will harvest the sutures today.  Recommended floating his heel to ensure no pressure on the heel and advance to sneakers to get pressure or off the ankle  Imaging: No results found.    Labs: Lab Results  Component Value Date   HGBA1C 7.5 (H) 07/23/2022   HGBA1C 10.1 (H) 09/08/2016   HGBA1C 9.1 (H) 08/28/2014   ESRSEDRATE 75 (H) 08/28/2014   ESRSEDRATE 42 (H) 11/17/2012   CRP 12.9 (H) 08/28/2014   LABURIC 3.0 (L) 08/31/2014   REPTSTATUS 10/03/2017 FINAL 09/30/2017   GRAMSTAIN  09/30/2017     RARE WBC PRESENT, PREDOMINANTLY MONONUCLEAR NO ORGANISMS SEEN    CULT  09/30/2017    NO GROWTH 2 DAYS Performed at [facility], [place] [address] [place], [place] [address]      Lab Results  Component Value Date   ALBUMIN 2.4 (L) 07/30/2022   ALBUMIN 3.3 (L) 09/07/2016   ALBUMIN 3.6 09/15/2014    No results found for: "MG" No results found for: "VD25OH"  No results found for: "PREALBUMIN"    Latest Ref Rng & Units 07/30/2022    5:19 AM 07/23/2022    8:35 AM 12/26/2021    7:51 AM  CBC EXTENDED  WBC 4.0 - 10.5 K/uL 8.3  7.3  7.5   RBC 4.22 - 5.81 MIL/uL 3.77  4.22  4.07   Hemoglobin 13.0 - 17.0 g/dL 11.5  13.2  12.7   HCT 39.0 - 52.0 % 36.6  40.0  39.7   Platelets 150 - 400 K/uL 278  204  195   NEUT# 1.7 - 7.7 K/uL 5.9     Lymph# 0.7 - 4.0 K/uL 0.8        There is no height or weight on file to calculate BMI.  Orders:  No orders of the defined types were placed in this encounter.  No orders of the defined types were placed in this encounter.    Procedures: No  procedures performed  Clinical Data: No additional findings.  ROS:  All other systems negative, except as noted in the HPI. Review of Systems  Objective: Vital Signs: There were no vitals taken for this visit.  Specialty Comments:  No specialty comments available.  PMFS History: Patient Active Problem List   Diagnosis Date Noted   Osteomyelitis of left lower extremity ([place]) 07/29/2022   History of transmetatarsal amputation of left foot ([place]) 07/23/2022   Pain in left foot 04/21/2018   PVD (peripheral vascular disease) ([place]) 04/21/2018   Amputated toe, left ([place]) 01/21/2018   Achilles tendon contracture, left 12/28/2017   Dehiscence of amputation stump ([place]) 11/10/2017   Great toe amputation status, left 11/10/2017   Memory change 04/14/2017   Cutaneous abscess of left foot 04/09/2017   Non-pressure chronic ulcer of right heel and midfoot limited to breakdown of skin ([place]) 10/07/2016    Influenza A    AKI (acute kidney injury) ([place]) 09/07/2016   Acute encephalopathy 09/07/2016   Acute respiratory failure with hypoxia ([place]) 09/07/2016   Elevated lactic acid level 09/07/2016   Elevated troponin 09/07/2016   Coronary artery calcification seen on CT scan 10/28/2015   Acute on chronic diastolic (congestive) heart failure ([place]) 09/26/2015   OSA (obstructive sleep apnea) 02/07/2015   Sepsis ([place]) 08/28/2014   Chronic diastolic heart failure ([place]) 09/21/2013   CVA (cerebral infarction) 08/08/2013   Dizziness 08/07/2013   TIA (transient ischemic attack) 08/07/2013   Atrial fibrillation ([place]) 08/07/2013   Hypertension 08/07/2013   Anemia 08/07/2013   Dyspnea 06/21/2013   Chest pain 11/16/2012   Atrial fibrillation with RVR ([place]) 11/16/2012   Gout 11/16/2012   Diabetes mellitus type 2, uncontrolled, with complications ([place]) 16/05/9603   CKD (chronic kidney disease), stage II 11/16/2012   Atypical atrial flutter ([place]) 11/16/2012   Pleuritic chest pain 11/16/2012   Morbid obesity ([place]) 11/16/2012   Obstructive sleep apnea 11/16/2012   Past Medical History:  Diagnosis Date   Anal fissure    h/o - no recent complications   Arthritis    hips   Atrial fibrillation (H[facility])    CKD (chronic kidney disease)    Depression    Diabetes mellitus without complication ([place])    Type II   Enlarged prostate    GERD (gastroesophageal reflux disease)    Gout    H/O hiatal hernia    Hard of hearing    History of kidney stones    Hx of typhoid fever    as a child   Hypertension    improved after diet/exercise   Memory change 04/14/2017   OSA (obstructive sleep apnea)    wears cpap   Pneumonia    PONV (postoperative nausea and vomiting)    also difficulty waking up   Stroke ([place]) 08/2013   short term memory loss (slight)    Family History  Problem Relation Age of Onset   COPD Father    Heart attack Sister        died age 9 of MI    Past Surgical History:  Procedure  Laterality Date   AMPUTATION Left 10/02/2017   Procedure: LEFT FOOT 1ST RAY AMPUTATION;  Surgeon: Newt Minion, MD;  Location: [facility];  Service: Orthopedics;  Laterality: Left;   AMPUTATION Left 01/15/2018   Procedure: LEFT FOOT 2ND TOE AMPUTATION;  Surgeon: Newt Minion, MD;  Location: [facility];  Service: Orthopedics;  Laterality: Left;   AMPUTATION Left 07/23/2022   Procedure: LEFT TRANSMETATARSAL AMPUTATION;  Surgeon: Newt Minion, MD;  Location: [facility];  Service: Orthopedics;  Laterality: Left;   CARPAL TUNNEL RELEASE Left 12/26/2021   Procedure: CARPAL TUNNEL RELEASE;  Surgeon: Leanora Cover, MD;  Location: [facility];  Service: Orthopedics;  Laterality: Left;   carpel tunnel Bilateral    CATARACT EXTRACTION W/ INTRAOCULAR LENS  IMPLANT, BILATERAL     CHOLECYSTECTOMY     COLONOSCOPY     EYE SURGERY     laser surgery    I & D EXTREMITY Left 09/15/2014   Procedure: Excision Necrotic Left Achilles, Skin Graft, apply Wound VAC;  Surgeon: Newt Minion, MD;  Location: [facility];  Service: Orthopedics;  Laterality: Left;   KIDNEY STONE SURGERY     KNEE ARTHROSCOPY Bilateral    LIGAMENT REPAIR     TONSILLECTOMY     VASECTOMY     Social History   Occupational History   Occupation: Retired  Tobacco Use   Smoking status: Never   Smokeless tobacco: Never  Vaping Use   Vaping Use: Never used  Substance and Sexual Activity   Alcohol use: Yes    Alcohol/week: 0.0 standard drinks of alcohol    Comment: rare - 1-2x /yr   Drug use: No   Sexual activity: Not on file

## 2022-08-13 ENCOUNTER — Telehealth: Payer: Self-pay | Admitting: Orthopedic Surgery

## 2022-08-13 DIAGNOSIS — S83206D Unspecified tear of unspecified meniscus, current injury, right knee, subsequent encounter: Secondary | ICD-10-CM | POA: Diagnosis not present

## 2022-08-13 DIAGNOSIS — Z4781 Encounter for orthopedic aftercare following surgical amputation: Secondary | ICD-10-CM | POA: Diagnosis not present

## 2022-08-13 DIAGNOSIS — L89896 Pressure-induced deep tissue damage of other site: Secondary | ICD-10-CM | POA: Diagnosis not present

## 2022-08-13 DIAGNOSIS — M160 Bilateral primary osteoarthritis of hip: Secondary | ICD-10-CM | POA: Diagnosis not present

## 2022-08-13 DIAGNOSIS — Z89432 Acquired absence of left foot: Secondary | ICD-10-CM | POA: Diagnosis not present

## 2022-08-13 DIAGNOSIS — M778 Other enthesopathies, not elsewhere classified: Secondary | ICD-10-CM | POA: Diagnosis not present

## 2022-08-13 NOTE — Telephone Encounter (Signed)
VO given to [employer]

## 2022-08-13 NOTE — Telephone Encounter (Signed)
Sherren (OT) called from [facility] called requesting home health OT for 1 wk 4. [place] phone number is [phone]. If unable to answer leave detailed message on secure line.

## 2022-08-14 ENCOUNTER — Telehealth: Payer: Self-pay | Admitting: Orthopedic Surgery

## 2022-08-14 DIAGNOSIS — S83206D Unspecified tear of unspecified meniscus, current injury, right knee, subsequent encounter: Secondary | ICD-10-CM | POA: Diagnosis not present

## 2022-08-14 DIAGNOSIS — M778 Other enthesopathies, not elsewhere classified: Secondary | ICD-10-CM | POA: Diagnosis not present

## 2022-08-14 DIAGNOSIS — Z89432 Acquired absence of left foot: Secondary | ICD-10-CM | POA: Diagnosis not present

## 2022-08-14 DIAGNOSIS — Z4781 Encounter for orthopedic aftercare following surgical amputation: Secondary | ICD-10-CM | POA: Diagnosis not present

## 2022-08-14 DIAGNOSIS — L89896 Pressure-induced deep tissue damage of other site: Secondary | ICD-10-CM | POA: Diagnosis not present

## 2022-08-14 DIAGNOSIS — M160 Bilateral primary osteoarthritis of hip: Secondary | ICD-10-CM | POA: Diagnosis not present

## 2022-08-14 NOTE — Telephone Encounter (Signed)
SW Amy she is aware that he can be minimal weightbearing or as tolerated.

## 2022-08-14 NOTE — Telephone Encounter (Signed)
Oscar [name] with [facility] wants to know if patients status has changed to weight bearing.. [phone]

## 2022-08-15 DIAGNOSIS — Z89432 Acquired absence of left foot: Secondary | ICD-10-CM | POA: Diagnosis not present

## 2022-08-15 DIAGNOSIS — E1165 Type 2 diabetes mellitus with hyperglycemia: Secondary | ICD-10-CM | POA: Diagnosis not present

## 2022-08-18 DIAGNOSIS — Z4781 Encounter for orthopedic aftercare following surgical amputation: Secondary | ICD-10-CM | POA: Diagnosis not present

## 2022-08-18 DIAGNOSIS — S83206D Unspecified tear of unspecified meniscus, current injury, right knee, subsequent encounter: Secondary | ICD-10-CM | POA: Diagnosis not present

## 2022-08-18 DIAGNOSIS — L89896 Pressure-induced deep tissue damage of other site: Secondary | ICD-10-CM | POA: Diagnosis not present

## 2022-08-18 DIAGNOSIS — Z89432 Acquired absence of left foot: Secondary | ICD-10-CM | POA: Diagnosis not present

## 2022-08-18 DIAGNOSIS — M778 Other enthesopathies, not elsewhere classified: Secondary | ICD-10-CM | POA: Diagnosis not present

## 2022-08-18 DIAGNOSIS — M160 Bilateral primary osteoarthritis of hip: Secondary | ICD-10-CM | POA: Diagnosis not present

## 2022-08-19 ENCOUNTER — Other Ambulatory Visit: Payer: Self-pay | Admitting: Neurology

## 2022-08-19 DIAGNOSIS — L89896 Pressure-induced deep tissue damage of other site: Secondary | ICD-10-CM | POA: Diagnosis not present

## 2022-08-19 DIAGNOSIS — M160 Bilateral primary osteoarthritis of hip: Secondary | ICD-10-CM | POA: Diagnosis not present

## 2022-08-19 DIAGNOSIS — Z4781 Encounter for orthopedic aftercare following surgical amputation: Secondary | ICD-10-CM | POA: Diagnosis not present

## 2022-08-19 DIAGNOSIS — Z89432 Acquired absence of left foot: Secondary | ICD-10-CM | POA: Diagnosis not present

## 2022-08-19 DIAGNOSIS — M778 Other enthesopathies, not elsewhere classified: Secondary | ICD-10-CM | POA: Diagnosis not present

## 2022-08-19 DIAGNOSIS — S83206D Unspecified tear of unspecified meniscus, current injury, right knee, subsequent encounter: Secondary | ICD-10-CM | POA: Diagnosis not present

## 2022-08-25 DIAGNOSIS — L89896 Pressure-induced deep tissue damage of other site: Secondary | ICD-10-CM | POA: Diagnosis not present

## 2022-08-25 DIAGNOSIS — M160 Bilateral primary osteoarthritis of hip: Secondary | ICD-10-CM | POA: Diagnosis not present

## 2022-08-25 DIAGNOSIS — Z89432 Acquired absence of left foot: Secondary | ICD-10-CM | POA: Diagnosis not present

## 2022-08-25 DIAGNOSIS — S83206D Unspecified tear of unspecified meniscus, current injury, right knee, subsequent encounter: Secondary | ICD-10-CM | POA: Diagnosis not present

## 2022-08-25 DIAGNOSIS — M778 Other enthesopathies, not elsewhere classified: Secondary | ICD-10-CM | POA: Diagnosis not present

## 2022-08-25 DIAGNOSIS — Z4781 Encounter for orthopedic aftercare following surgical amputation: Secondary | ICD-10-CM | POA: Diagnosis not present

## 2022-09-01 ENCOUNTER — Encounter: Payer: Self-pay | Admitting: Orthopedic Surgery

## 2022-09-01 ENCOUNTER — Ambulatory Visit (INDEPENDENT_AMBULATORY_CARE_PROVIDER_SITE_OTHER): Payer: Medicare Other | Admitting: Orthopedic Surgery

## 2022-09-01 DIAGNOSIS — Z4781 Encounter for orthopedic aftercare following surgical amputation: Secondary | ICD-10-CM | POA: Diagnosis not present

## 2022-09-01 DIAGNOSIS — M160 Bilateral primary osteoarthritis of hip: Secondary | ICD-10-CM | POA: Diagnosis not present

## 2022-09-01 DIAGNOSIS — L89896 Pressure-induced deep tissue damage of other site: Secondary | ICD-10-CM | POA: Diagnosis not present

## 2022-09-01 DIAGNOSIS — M778 Other enthesopathies, not elsewhere classified: Secondary | ICD-10-CM | POA: Diagnosis not present

## 2022-09-01 DIAGNOSIS — S83206D Unspecified tear of unspecified meniscus, current injury, right knee, subsequent encounter: Secondary | ICD-10-CM | POA: Diagnosis not present

## 2022-09-01 DIAGNOSIS — Z89432 Acquired absence of left foot: Secondary | ICD-10-CM | POA: Diagnosis not present

## 2022-09-01 DIAGNOSIS — L97521 Non-pressure chronic ulcer of other part of left foot limited to breakdown of skin: Secondary | ICD-10-CM

## 2022-09-02 DIAGNOSIS — M778 Other enthesopathies, not elsewhere classified: Secondary | ICD-10-CM | POA: Diagnosis not present

## 2022-09-02 DIAGNOSIS — M160 Bilateral primary osteoarthritis of hip: Secondary | ICD-10-CM | POA: Diagnosis not present

## 2022-09-02 DIAGNOSIS — Z4781 Encounter for orthopedic aftercare following surgical amputation: Secondary | ICD-10-CM | POA: Diagnosis not present

## 2022-09-02 DIAGNOSIS — S83206D Unspecified tear of unspecified meniscus, current injury, right knee, subsequent encounter: Secondary | ICD-10-CM | POA: Diagnosis not present

## 2022-09-02 DIAGNOSIS — L89896 Pressure-induced deep tissue damage of other site: Secondary | ICD-10-CM | POA: Diagnosis not present

## 2022-09-02 DIAGNOSIS — Z89432 Acquired absence of left foot: Secondary | ICD-10-CM | POA: Diagnosis not present

## 2022-09-03 ENCOUNTER — Encounter: Payer: Self-pay | Admitting: Orthopedic Surgery

## 2022-09-03 NOTE — Progress Notes (Signed)
Office Visit Note   Patient: Oscar [name].           Date of Birth: [birth year]           MRN: [id number] Visit Date: 09/01/2022              Requested by: Kathalene Frames, MD [address], [place],  [place] [address] PCP: Kathalene Frames, MD  Chief Complaint  Patient presents with   Left Foot - Follow-up    Left transmet amputation 07/23/2022      HPI: Patient is a 77 year old gentleman who is status post left transmetatarsal amputation approximately 5 weeks ago.  Assessment & Plan: Visit Diagnoses:  1. History of transmetatarsal amputation of left foot ([place])   2. Skin ulcer of plantar aspect of foot, left, limited to breakdown of skin ([place])     Plan: Patient was provided a prescription to obtain a stair lift for him to get up stairs at his home.  A PRAFO was provided for the heel ulcer.  Follow-Up Instructions: Return in about 2 weeks (around 09/15/2022).   Ortho Exam  Patient is alert, oriented, no adenopathy, well-dressed, normal affect, normal respiratory effort. Examination patient's Achilles ulcer and medial malleolar ulcer is stable.  He has improved healing of the transmetatarsal amputation with decreased swelling.  Patient has 2 scabs that are about 10 mm in diameter.  No drainage or cellulitis.  Imaging: No results found. No images are attached to the encounter.  Labs: Lab Results  Component Value Date   HGBA1C 7.5 (H) 07/23/2022   HGBA1C 10.1 (H) 09/08/2016   HGBA1C 9.1 (H) 08/28/2014   ESRSEDRATE 75 (H) 08/28/2014   ESRSEDRATE 42 (H) 11/17/2012   CRP 12.9 (H) 08/28/2014   LABURIC 3.0 (L) 08/31/2014   REPTSTATUS 10/03/2017 FINAL 09/30/2017   GRAMSTAIN  09/30/2017    RARE WBC PRESENT, PREDOMINANTLY MONONUCLEAR NO ORGANISMS SEEN    CULT  09/30/2017    NO GROWTH 2 DAYS Performed at [facility], [place] [address] [place], [place] [address]      Lab Results  Component Value Date   ALBUMIN 2.4 (L) 07/30/2022    ALBUMIN 3.3 (L) 09/07/2016   ALBUMIN 3.6 09/15/2014    No results found for: "MG" No results found for: "VD25OH"  No results found for: "PREALBUMIN"    Latest Ref Rng & Units 07/30/2022    5:19 AM 07/23/2022    8:35 AM 12/26/2021    7:51 AM  CBC EXTENDED  WBC 4.0 - 10.5 K/uL 8.3  7.3  7.5   RBC 4.22 - 5.81 MIL/uL 3.77  4.22  4.07   Hemoglobin 13.0 - 17.0 g/dL 11.5  13.2  12.7   HCT 39.0 - 52.0 % 36.6  40.0  39.7   Platelets 150 - 400 K/uL 278  204  195   NEUT# 1.7 - 7.7 K/uL 5.9     Lymph# 0.7 - 4.0 K/uL 0.8        There is no height or weight on file to calculate BMI.  Orders:  No orders of the defined types were placed in this encounter.  No orders of the defined types were placed in this encounter.    Procedures: No procedures performed  Clinical Data: No additional findings.  ROS:  All other systems negative, except as noted in the HPI. Review of Systems  Objective: Vital Signs: There were no vitals taken for this visit.  Specialty Comments:  No specialty comments available.  PMFS History: Patient Active Problem List   Diagnosis Date Noted   Osteomyelitis of left lower extremity ([place]) 07/29/2022   History of transmetatarsal amputation of left foot ([place]) 07/23/2022   Pain in left foot 04/21/2018   PVD (peripheral vascular disease) ([place]) 04/21/2018   Amputated toe, left ([place]) 01/21/2018   Achilles tendon contracture, left 12/28/2017   Dehiscence of amputation stump ([place]) 11/10/2017   Great toe amputation status, left 11/10/2017   Memory change 04/14/2017   Cutaneous abscess of left foot 04/09/2017   Non-pressure chronic ulcer of right heel and midfoot limited to breakdown of skin ([place]) 10/07/2016   Influenza A    AKI (acute kidney injury) ([place]) 09/07/2016   Acute encephalopathy 09/07/2016   Acute respiratory failure with hypoxia ([place]) 09/07/2016   Elevated lactic acid level 09/07/2016   Elevated troponin 09/07/2016   Coronary artery  calcification seen on CT scan 10/28/2015   Acute on chronic diastolic (congestive) heart failure ([place]) 09/26/2015   OSA (obstructive sleep apnea) 02/07/2015   Sepsis ([place]) 08/28/2014   Chronic diastolic heart failure ([place]) 09/21/2013   CVA (cerebral infarction) 08/08/2013   Dizziness 08/07/2013   TIA (transient ischemic attack) 08/07/2013   Atrial fibrillation ([place]) 08/07/2013   Hypertension 08/07/2013   Anemia 08/07/2013   Dyspnea 06/21/2013   Chest pain 11/16/2012   Atrial fibrillation with RVR ([place]) 11/16/2012   Gout 11/16/2012   Diabetes mellitus type 2, uncontrolled, with complications ([place]) 65/53/7482   CKD (chronic kidney disease), stage II 11/16/2012   Atypical atrial flutter ([place]) 11/16/2012   Pleuritic chest pain 11/16/2012   Morbid obesity ([place]) 11/16/2012   Obstructive sleep apnea 11/16/2012   Past Medical History:  Diagnosis Date   Anal fissure    h/o - no recent complications   Arthritis    hips   Atrial fibrillation (HCC)    CKD (chronic kidney disease)    Depression    Diabetes mellitus without complication ([place])    Type II   Enlarged prostate    GERD (gastroesophageal reflux disease)    Gout    H/O hiatal hernia    Hard of hearing    History of kidney stones    Hx of typhoid fever    as a child   Hypertension    improved after diet/exercise   Memory change 04/14/2017   OSA (obstructive sleep apnea)    wears cpap   Pneumonia    PONV (postoperative nausea and vomiting)    also difficulty waking up   Stroke ([place]) 08/2013   short term memory loss (slight)    Family History  Problem Relation Age of Onset   COPD Father    Heart attack Sister        died age 32 of MI    Past Surgical History:  Procedure Laterality Date   AMPUTATION Left 10/02/2017   Procedure: LEFT FOOT 1ST RAY AMPUTATION;  Surgeon: Newt Minion, MD;  Location: [facility];  Service: Orthopedics;  Laterality: Left;   AMPUTATION Left 01/15/2018   Procedure: LEFT FOOT 2ND TOE  AMPUTATION;  Surgeon: Newt Minion, MD;  Location: [facility];  Service: Orthopedics;  Laterality: Left;   AMPUTATION Left 07/23/2022   Procedure: LEFT TRANSMETATARSAL AMPUTATION;  Surgeon: Newt Minion, MD;  Location: [facility];  Service: Orthopedics;  Laterality: Left;   CARPAL TUNNEL RELEASE Left 12/26/2021   Procedure: CARPAL TUNNEL RELEASE;  Surgeon: Leanora Cover, MD;  Location: [facility];  Service:  Orthopedics;  Laterality: Left;   carpel tunnel Bilateral    CATARACT EXTRACTION W/ INTRAOCULAR LENS  IMPLANT, BILATERAL     CHOLECYSTECTOMY     COLONOSCOPY     EYE SURGERY     laser surgery    I & D EXTREMITY Left 09/15/2014   Procedure: Excision Necrotic Left Achilles, Skin Graft, apply Wound VAC;  Surgeon: Newt Minion, MD;  Location: [facility];  Service: Orthopedics;  Laterality: Left;   KIDNEY STONE SURGERY     KNEE ARTHROSCOPY Bilateral    LIGAMENT REPAIR     TONSILLECTOMY     VASECTOMY     Social History   Occupational History   Occupation: Retired  Tobacco Use   Smoking status: Never   Smokeless tobacco: Never  Vaping Use   Vaping Use: Never used  Substance and Sexual Activity   Alcohol use: Yes    Alcohol/week: 0.0 standard drinks of alcohol    Comment: rare - 1-2x /yr   Drug use: No   Sexual activity: Not on file

## 2022-09-06 DIAGNOSIS — I4891 Unspecified atrial fibrillation: Secondary | ICD-10-CM | POA: Diagnosis not present

## 2022-09-06 DIAGNOSIS — E11319 Type 2 diabetes mellitus with unspecified diabetic retinopathy without macular edema: Secondary | ICD-10-CM | POA: Diagnosis not present

## 2022-09-06 DIAGNOSIS — E875 Hyperkalemia: Secondary | ICD-10-CM | POA: Diagnosis not present

## 2022-09-06 DIAGNOSIS — M160 Bilateral primary osteoarthritis of hip: Secondary | ICD-10-CM | POA: Diagnosis not present

## 2022-09-06 DIAGNOSIS — H538 Other visual disturbances: Secondary | ICD-10-CM | POA: Diagnosis not present

## 2022-09-06 DIAGNOSIS — M1030 Gout due to renal impairment, unspecified site: Secondary | ICD-10-CM | POA: Diagnosis not present

## 2022-09-06 DIAGNOSIS — E1122 Type 2 diabetes mellitus with diabetic chronic kidney disease: Secondary | ICD-10-CM | POA: Diagnosis not present

## 2022-09-06 DIAGNOSIS — N401 Enlarged prostate with lower urinary tract symptoms: Secondary | ICD-10-CM | POA: Diagnosis not present

## 2022-09-06 DIAGNOSIS — R350 Frequency of micturition: Secondary | ICD-10-CM | POA: Diagnosis not present

## 2022-09-06 DIAGNOSIS — K449 Diaphragmatic hernia without obstruction or gangrene: Secondary | ICD-10-CM | POA: Diagnosis not present

## 2022-09-06 DIAGNOSIS — S83206D Unspecified tear of unspecified meniscus, current injury, right knee, subsequent encounter: Secondary | ICD-10-CM | POA: Diagnosis not present

## 2022-09-06 DIAGNOSIS — I5032 Chronic diastolic (congestive) heart failure: Secondary | ICD-10-CM | POA: Diagnosis not present

## 2022-09-06 DIAGNOSIS — I69318 Other symptoms and signs involving cognitive functions following cerebral infarction: Secondary | ICD-10-CM | POA: Diagnosis not present

## 2022-09-06 DIAGNOSIS — Z4781 Encounter for orthopedic aftercare following surgical amputation: Secondary | ICD-10-CM | POA: Diagnosis not present

## 2022-09-06 DIAGNOSIS — Z89432 Acquired absence of left foot: Secondary | ICD-10-CM | POA: Diagnosis not present

## 2022-09-06 DIAGNOSIS — M778 Other enthesopathies, not elsewhere classified: Secondary | ICD-10-CM | POA: Diagnosis not present

## 2022-09-06 DIAGNOSIS — N189 Chronic kidney disease, unspecified: Secondary | ICD-10-CM | POA: Diagnosis not present

## 2022-09-06 DIAGNOSIS — I130 Hypertensive heart and chronic kidney disease with heart failure and stage 1 through stage 4 chronic kidney disease, or unspecified chronic kidney disease: Secondary | ICD-10-CM | POA: Diagnosis not present

## 2022-09-06 DIAGNOSIS — L89896 Pressure-induced deep tissue damage of other site: Secondary | ICD-10-CM | POA: Diagnosis not present

## 2022-09-06 DIAGNOSIS — G4733 Obstructive sleep apnea (adult) (pediatric): Secondary | ICD-10-CM | POA: Diagnosis not present

## 2022-09-06 DIAGNOSIS — F0393 Unspecified dementia, unspecified severity, with mood disturbance: Secondary | ICD-10-CM | POA: Diagnosis not present

## 2022-09-06 DIAGNOSIS — R3911 Hesitancy of micturition: Secondary | ICD-10-CM | POA: Diagnosis not present

## 2022-09-06 DIAGNOSIS — D631 Anemia in chronic kidney disease: Secondary | ICD-10-CM | POA: Diagnosis not present

## 2022-09-06 DIAGNOSIS — E785 Hyperlipidemia, unspecified: Secondary | ICD-10-CM | POA: Diagnosis not present

## 2022-09-06 DIAGNOSIS — F32A Depression, unspecified: Secondary | ICD-10-CM | POA: Diagnosis not present

## 2022-09-08 DIAGNOSIS — S83206D Unspecified tear of unspecified meniscus, current injury, right knee, subsequent encounter: Secondary | ICD-10-CM | POA: Diagnosis not present

## 2022-09-08 DIAGNOSIS — Z4781 Encounter for orthopedic aftercare following surgical amputation: Secondary | ICD-10-CM | POA: Diagnosis not present

## 2022-09-08 DIAGNOSIS — L89896 Pressure-induced deep tissue damage of other site: Secondary | ICD-10-CM | POA: Diagnosis not present

## 2022-09-08 DIAGNOSIS — M778 Other enthesopathies, not elsewhere classified: Secondary | ICD-10-CM | POA: Diagnosis not present

## 2022-09-08 DIAGNOSIS — Z89432 Acquired absence of left foot: Secondary | ICD-10-CM | POA: Diagnosis not present

## 2022-09-08 DIAGNOSIS — M160 Bilateral primary osteoarthritis of hip: Secondary | ICD-10-CM | POA: Diagnosis not present

## 2022-09-10 DIAGNOSIS — Z89432 Acquired absence of left foot: Secondary | ICD-10-CM | POA: Diagnosis not present

## 2022-09-10 DIAGNOSIS — M778 Other enthesopathies, not elsewhere classified: Secondary | ICD-10-CM | POA: Diagnosis not present

## 2022-09-10 DIAGNOSIS — L89896 Pressure-induced deep tissue damage of other site: Secondary | ICD-10-CM | POA: Diagnosis not present

## 2022-09-10 DIAGNOSIS — M160 Bilateral primary osteoarthritis of hip: Secondary | ICD-10-CM | POA: Diagnosis not present

## 2022-09-10 DIAGNOSIS — Z4781 Encounter for orthopedic aftercare following surgical amputation: Secondary | ICD-10-CM | POA: Diagnosis not present

## 2022-09-10 DIAGNOSIS — S83206D Unspecified tear of unspecified meniscus, current injury, right knee, subsequent encounter: Secondary | ICD-10-CM | POA: Diagnosis not present

## 2022-09-11 DIAGNOSIS — K219 Gastro-esophageal reflux disease without esophagitis: Secondary | ICD-10-CM | POA: Diagnosis not present

## 2022-09-11 DIAGNOSIS — I10 Essential (primary) hypertension: Secondary | ICD-10-CM | POA: Diagnosis not present

## 2022-09-11 DIAGNOSIS — I4891 Unspecified atrial fibrillation: Secondary | ICD-10-CM | POA: Diagnosis not present

## 2022-09-11 DIAGNOSIS — N400 Enlarged prostate without lower urinary tract symptoms: Secondary | ICD-10-CM | POA: Diagnosis not present

## 2022-09-11 DIAGNOSIS — E782 Mixed hyperlipidemia: Secondary | ICD-10-CM | POA: Diagnosis not present

## 2022-09-11 DIAGNOSIS — D509 Iron deficiency anemia, unspecified: Secondary | ICD-10-CM | POA: Diagnosis not present

## 2022-09-11 DIAGNOSIS — E1121 Type 2 diabetes mellitus with diabetic nephropathy: Secondary | ICD-10-CM | POA: Diagnosis not present

## 2022-09-11 DIAGNOSIS — I509 Heart failure, unspecified: Secondary | ICD-10-CM | POA: Diagnosis not present

## 2022-09-15 ENCOUNTER — Encounter: Payer: Medicare Other | Admitting: Physical Medicine and Rehabilitation

## 2022-09-16 ENCOUNTER — Ambulatory Visit (INDEPENDENT_AMBULATORY_CARE_PROVIDER_SITE_OTHER): Payer: Medicare Other | Admitting: Orthopedic Surgery

## 2022-09-16 DIAGNOSIS — Z89432 Acquired absence of left foot: Secondary | ICD-10-CM

## 2022-09-18 ENCOUNTER — Telehealth: Payer: Self-pay | Admitting: Orthopedic Surgery

## 2022-09-18 NOTE — Telephone Encounter (Signed)
Received call from Amy (PT) with [facility] advised she was suppose to discharge patient today but, all of his family is sick. Amy said she will discharge patient next week. The number to contact Amy is   [phone]

## 2022-09-18 NOTE — Telephone Encounter (Signed)
Noted  

## 2022-09-23 DIAGNOSIS — S83206D Unspecified tear of unspecified meniscus, current injury, right knee, subsequent encounter: Secondary | ICD-10-CM | POA: Diagnosis not present

## 2022-09-23 DIAGNOSIS — L89896 Pressure-induced deep tissue damage of other site: Secondary | ICD-10-CM | POA: Diagnosis not present

## 2022-09-23 DIAGNOSIS — Z89432 Acquired absence of left foot: Secondary | ICD-10-CM | POA: Diagnosis not present

## 2022-09-23 DIAGNOSIS — M160 Bilateral primary osteoarthritis of hip: Secondary | ICD-10-CM | POA: Diagnosis not present

## 2022-09-23 DIAGNOSIS — Z4781 Encounter for orthopedic aftercare following surgical amputation: Secondary | ICD-10-CM | POA: Diagnosis not present

## 2022-09-23 DIAGNOSIS — M778 Other enthesopathies, not elsewhere classified: Secondary | ICD-10-CM | POA: Diagnosis not present

## 2022-09-26 ENCOUNTER — Encounter: Payer: Self-pay | Admitting: Orthopedic Surgery

## 2022-09-26 NOTE — Progress Notes (Signed)
Office Visit Note   Patient: Oscar [name].           Date of Birth: Dec 22, 1945           MRN: [id number] Visit Date: 09/16/2022              Requested by: Kathalene Frames, MD [address], [place],  [place] [id number] PCP: Kathalene Frames, MD  Chief Complaint  Patient presents with   Left Foot - Routine Post Op    07/23/22 left transmet amputation      HPI: Patient is a 77 year old gentleman who presents 2 weeks status post left transmetatarsal amputation.  He is in a [facility] boot for the left heel ulcer.  He has completed his antibiotics.  Assessment & Plan: Visit Diagnoses:  1. History of transmetatarsal amputation of left foot ([place])     Plan: Recommend wearing the PRAFO when not ambulating.  Follow-Up Instructions: Return in about 2 weeks (around 09/30/2022).   Ortho Exam  Patient is alert, oriented, no adenopathy, well-dressed, normal affect, normal respiratory effort. Examination the heel ulcer is 50% smaller still has a small ulcer ulcer over the medial malleolus and distal transmetatarsal amputation callus.  Imaging: No results found. No images are attached to the encounter.  Labs: Lab Results  Component Value Date   HGBA1C 7.5 (H) 07/23/2022   HGBA1C 10.1 (H) 09/08/2016   HGBA1C 9.1 (H) 08/28/2014   ESRSEDRATE 75 (H) 08/28/2014   ESRSEDRATE 42 (H) 11/17/2012   CRP 12.9 (H) 08/28/2014   LABURIC 3.0 (L) 08/31/2014   REPTSTATUS 10/03/2017 FINAL 09/30/2017   GRAMSTAIN  09/30/2017    RARE WBC PRESENT, PREDOMINANTLY MONONUCLEAR NO ORGANISMS SEEN    CULT  09/30/2017    NO GROWTH 2 DAYS Performed at [facility], [place] [address] [place], [place] [address]      Lab Results  Component Value Date   ALBUMIN 2.4 (L) 07/30/2022   ALBUMIN 3.3 (L) 09/07/2016   ALBUMIN 3.6 09/15/2014    No results found for: "MG" No results found for: "VD25OH"  No results found for: "PREALBUMIN"    Latest Ref Rng & Units 07/30/2022     5:19 AM 07/23/2022    8:35 AM 12/26/2021    7:51 AM  CBC EXTENDED  WBC 4.0 - 10.5 K/uL 8.3  7.3  7.5   RBC 4.22 - 5.81 MIL/uL 3.77  4.22  4.07   Hemoglobin 13.0 - 17.0 g/dL 11.5  13.2  12.7   HCT 39.0 - 52.0 % 36.6  40.0  39.7   Platelets 150 - 400 K/uL 278  204  195   NEUT# 1.7 - 7.7 K/uL 5.9     Lymph# 0.7 - 4.0 K/uL 0.8        There is no height or weight on file to calculate BMI.  Orders:  No orders of the defined types were placed in this encounter.  No orders of the defined types were placed in this encounter.    Procedures: No procedures performed  Clinical Data: No additional findings.  ROS:  All other systems negative, except as noted in the HPI. Review of Systems  Objective: Vital Signs: There were no vitals taken for this visit.  Specialty Comments:  No specialty comments available.  PMFS History: Patient Active Problem List   Diagnosis Date Noted   Osteomyelitis of left lower extremity ([place]) 07/29/2022   History of transmetatarsal amputation of left foot ([place]) 07/23/2022  Pain in left foot 04/21/2018   PVD (peripheral vascular disease) ([place]) 04/21/2018   Amputated toe, left ([place]) 01/21/2018   Achilles tendon contracture, left 12/28/2017   Dehiscence of amputation stump ([place]) 11/10/2017   Great toe amputation status, left 11/10/2017   Memory change 04/14/2017   Cutaneous abscess of left foot 04/09/2017   Non-pressure chronic ulcer of right heel and midfoot limited to breakdown of skin ([place]) 10/07/2016   Influenza A    AKI (acute kidney injury) ([place]) 09/07/2016   Acute encephalopathy 09/07/2016   Acute respiratory failure with hypoxia (HCC) 09/07/2016   Elevated lactic acid level 09/07/2016   Elevated troponin 09/07/2016   Coronary artery calcification seen on CT scan 10/28/2015   Acute on chronic diastolic (congestive) heart failure ([place]) 09/26/2015   OSA (obstructive sleep apnea) 02/07/2015   Sepsis ([place]) 08/28/2014   Chronic diastolic  heart failure ([place]) 09/21/2013   CVA (cerebral infarction) 08/08/2013   Dizziness 08/07/2013   TIA (transient ischemic attack) 08/07/2013   Atrial fibrillation ([place]) 08/07/2013   Hypertension 08/07/2013   Anemia 08/07/2013   Dyspnea 06/21/2013   Chest pain 11/16/2012   Atrial fibrillation with RVR ([place]) 11/16/2012   Gout 11/16/2012   Diabetes mellitus type 2, uncontrolled, with complications ([place]) [id number]   CKD (chronic kidney disease), stage II 11/16/2012   Atypical atrial flutter ([place]) 11/16/2012   Pleuritic chest pain 11/16/2012   Morbid obesity ([place]) 11/16/2012   Obstructive sleep apnea 11/16/2012   Past Medical History:  Diagnosis Date   Anal fissure    h/o - no recent complications   Arthritis    hips   Atrial fibrillation (HCC)    CKD (chronic kidney disease)    Depression    Diabetes mellitus without complication ([place])    Type II   Enlarged prostate    GERD (gastroesophageal reflux disease)    Gout    H/O hiatal hernia    Hard of hearing    History of kidney stones    Hx of typhoid fever    as a child   Hypertension    improved after diet/exercise   Memory change 04/14/2017   OSA (obstructive sleep apnea)    wears cpap   Pneumonia    PONV (postoperative nausea and vomiting)    also difficulty waking up   Stroke ([place]) 08/2013   short term memory loss (slight)    Family History  Problem Relation Age of Onset   COPD Father    Heart attack Sister        died age 19 of MI    Past Surgical History:  Procedure Laterality Date   AMPUTATION Left 10/02/2017   Procedure: LEFT FOOT 1ST RAY AMPUTATION;  Surgeon: Newt Minion, MD;  Location: [facility];  Service: Orthopedics;  Laterality: Left;   AMPUTATION Left 01/15/2018   Procedure: LEFT FOOT 2ND TOE AMPUTATION;  Surgeon: Newt Minion, MD;  Location: [facility];  Service: Orthopedics;  Laterality: Left;   AMPUTATION Left 07/23/2022   Procedure: LEFT TRANSMETATARSAL AMPUTATION;  Surgeon: Newt Minion, MD;   Location: [facility];  Service: Orthopedics;  Laterality: Left;   CARPAL TUNNEL RELEASE Left 12/26/2021   Procedure: CARPAL TUNNEL RELEASE;  Surgeon: Leanora Cover, MD;  Location: [facility];  Service: Orthopedics;  Laterality: Left;   carpel tunnel Bilateral    CATARACT EXTRACTION W/ INTRAOCULAR LENS  IMPLANT, BILATERAL     CHOLECYSTECTOMY     COLONOSCOPY     EYE SURGERY  laser surgery    I & D EXTREMITY Left 09/15/2014   Procedure: Excision Necrotic Left Achilles, Skin Graft, apply Wound VAC;  Surgeon: Newt Minion, MD;  Location: [facility];  Service: Orthopedics;  Laterality: Left;   KIDNEY STONE SURGERY     KNEE ARTHROSCOPY Bilateral    LIGAMENT REPAIR     TONSILLECTOMY     VASECTOMY     Social History   Occupational History   Occupation: Retired  Tobacco Use   Smoking status: Never   Smokeless tobacco: Never  Vaping Use   Vaping Use: Never used  Substance and Sexual Activity   Alcohol use: Yes    Alcohol/week: 0.0 standard drinks of alcohol    Comment: rare - 1-2x /yr   Drug use: No   Sexual activity: Not on file

## 2022-09-30 ENCOUNTER — Ambulatory Visit (INDEPENDENT_AMBULATORY_CARE_PROVIDER_SITE_OTHER): Payer: Medicare Other | Admitting: Orthopedic Surgery

## 2022-09-30 DIAGNOSIS — Z89432 Acquired absence of left foot: Secondary | ICD-10-CM

## 2022-10-03 ENCOUNTER — Encounter
Payer: Medicare Other | Attending: Physical Medicine and Rehabilitation | Admitting: Physical Medicine and Rehabilitation

## 2022-10-03 VITALS — BP 124/63 | HR 62 | Ht 69.0 in | Wt 265.0 lb

## 2022-10-03 DIAGNOSIS — I10 Essential (primary) hypertension: Secondary | ICD-10-CM | POA: Insufficient documentation

## 2022-10-03 DIAGNOSIS — Z89432 Acquired absence of left foot: Secondary | ICD-10-CM | POA: Insufficient documentation

## 2022-10-03 NOTE — Progress Notes (Unsigned)
Subjective:    Patient ID: Oscar Shells., male    DOB: April 26, 1946, 77 y.o.   MRN: [id number]  HPI Oscar [name] is a 77 year old man who presents for follow-up left foot osteomyelitis.   1) Left home osteomyelitis: -cannot weightbear currently -living with his daughter Denies pain -not doing any home therapy until wound heals   2) HTN: -looks excellent today  3) Type 2 diabetes:  Pain Inventory Average Pain 0 Pain Right Now 0 My pain is  no pain  In the last 24 hours, has pain interfered with the following? General activity 0 Relation with others 0 Enjoyment of life 0 What TIME of day is your pain at its worst? varies Sleep (in general) Good  Pain is worse with:  no pain Pain improves with:  no pain Relief from Meds:  no pain  use a walker how many minutes can you walk? 0 ability to climb steps?  no do you drive?  no use a wheelchair transfers alone  retired I need assistance with the following:  meal prep, household duties, and shopping  No problems in this area  Hospital f/u  Hospital f/u    Family History  Problem Relation Age of Onset   COPD Father    Heart attack Sister        died age 60 of MI   Social History   Socioeconomic History   Marital status: Widowed    Spouse name: Oscar [name]"   Number of children: 3   Years of education: 12   Highest education level: Not on file  Occupational History   Occupation: Retired  Tobacco Use   Smoking status: Never   Smokeless tobacco: Never  Vaping Use   Vaping Use: Never used  Substance and Sexual Activity   Alcohol use: Yes    Alcohol/week: 0.0 standard drinks of alcohol    Comment: rare - 1-2x /yr   Drug use: No   Sexual activity: Not on file  Other Topics Concern   Not on file  Social History Narrative   Lives with wife   Caffeine use: Tea sometimes (unsweet)   Right handed    Social Determinants of Health   Financial Resource Strain: Not on file  Food Insecurity: No Food  Insecurity (07/23/2022)   Hunger Vital Sign    Worried About Running Out of Food in the Last Year: Never true    Ran Out of Food in the Last Year: Never true  Transportation Needs: No Transportation Needs (07/23/2022)   PRAPARE - Hydrologist (Medical): No    Lack of Transportation (Non-Medical): No  Physical Activity: Not on file  Stress: Not on file  Social Connections: Not on file   Past Surgical History:  Procedure Laterality Date   AMPUTATION Left 10/02/2017   Procedure: LEFT FOOT 1ST RAY AMPUTATION;  Surgeon: Newt Minion, MD;  Location: [facility];  Service: Orthopedics;  Laterality: Left;   AMPUTATION Left 01/15/2018   Procedure: LEFT FOOT 2ND TOE AMPUTATION;  Surgeon: Newt Minion, MD;  Location: [facility];  Service: Orthopedics;  Laterality: Left;   AMPUTATION Left 07/23/2022   Procedure: LEFT TRANSMETATARSAL AMPUTATION;  Surgeon: Newt Minion, MD;  Location: [facility];  Service: Orthopedics;  Laterality: Left;   CARPAL TUNNEL RELEASE Left 12/26/2021   Procedure: CARPAL TUNNEL RELEASE;  Surgeon: Leanora Cover, MD;  Location: [facility];  Service: Orthopedics;  Laterality: Left;   carpel  tunnel Bilateral    CATARACT EXTRACTION W/ INTRAOCULAR LENS  IMPLANT, BILATERAL     CHOLECYSTECTOMY     COLONOSCOPY     EYE SURGERY     laser surgery    I & D EXTREMITY Left 09/15/2014   Procedure: Excision Necrotic Left Achilles, Skin Graft, apply Wound VAC;  Surgeon: Newt Minion, MD;  Location: [facility];  Service: Orthopedics;  Laterality: Left;   KIDNEY STONE SURGERY     KNEE ARTHROSCOPY Bilateral    LIGAMENT REPAIR     TONSILLECTOMY     VASECTOMY     Past Medical History:  Diagnosis Date   Anal fissure    h/o - no recent complications   Arthritis    hips   Atrial fibrillation (HCC)    CKD (chronic kidney disease)    Depression    Diabetes mellitus without complication (HCC)    Type II   Enlarged prostate    GERD (gastroesophageal reflux disease)    Gout    H/O  hiatal hernia    Hard of hearing    History of kidney stones    Hx of typhoid fever    as a child   Hypertension    improved after diet/exercise   Memory change 04/14/2017   OSA (obstructive sleep apnea)    wears cpap   Pneumonia    PONV (postoperative nausea and vomiting)    also difficulty waking up   Stroke ([place]) 08/2013   short term memory loss (slight)   BP 124/63   Pulse 62   Ht '5\' 9"'$  (1.753 m)   Wt 265 lb (120.2 kg)   SpO2 94%   BMI 39.13 kg/m   Opioid Risk Score:   Fall Risk Score:  `1  Depression screen [facility] 2/9     10/03/2022   11:45 AM  Depression screen PHQ 2/9  Decreased Interest 0  Down, Depressed, Hopeless 0  PHQ - 2 Score 0  Altered sleeping 0  Tired, decreased energy 1  Change in appetite 0  Feeling bad or failure about yourself  0  Trouble concentrating 0  Moving slowly or fidgety/restless 0  Suicidal thoughts 0  PHQ-9 Score 1  Difficult doing work/chores Not difficult at all     Review of Systems  All other systems reviewed and are negative.     Objective:   Physical Exam Gen: no distress, normal appearing HEENT: oral mucosa pink and moist, NCAT Cardio: Reg rate Chest: normal effort, normal rate of breathing Abd: soft, non-distended Ext: no edema Psych: pleasant, normal affect Skin: intact Neuro: Alert and oriented x3. Using wheelchair for mobility. Assisted by daughter.        Assessment & Plan:   1) Debility secondary to osteomyelitis with transmetatarsal amputation -discussed methods of improving wound healing/preventing infection such as hyperbaric oxygen therapy, red light therapy, and application of manuka honey -encouraged diet low in added sugar and refined carbohydrates and high in protein to promote wound healin  2) HTN -continue lasix and cardizem -Advised checking BP daily at home and logging results to bring into follow-up appointment with PCP  -Reviewed BP meds today.  -Advised regarding healthy foods that can  help lower blood pressure and provided with a list: 1) citrus foods- high in vitamins and minerals 2) salmon and other fatty fish - reduces inflammation and oxylipins 3) swiss chard (leafy green)- high level of nitrates 4) pumpkin seeds- one of the best natural sources of magnesium 5) Beans and  lentils- high in fiber, magnesium, and potassium 6) Berries- high in flavonoids 7) Amaranth (whole grain, can be cooked similarly to rice and oats)- high in magnesium and fiber 8) Pistachios- even more effective at reducing BP than other nuts 9) Carrots- high in phenolic compounds that relax blood vessels and reduce inflammation 10) Celery- contain phthalides that relax tissues of arterial walls 11) Tomatoes- can also improve cholesterol and reduce risk of heart disease 12) Broccoli- good source of magnesium, calcium, and potassium 13) Greek yogurt: high in potassium and calcium 14) Herbs and spices: Celery seed, cilantro, saffron, lemongrass, black cumin, ginseng, cinnamon, cardamom, sweet basil, and ginger 15) Chia and flax seeds- also help to lower cholesterol and blood sugar 16) Beets- high levels of nitrates that relax blood vessels  17) spinach and bananas- high in potassium  -Provided lise of supplements that can help with hypertension:  1) magnesium: one high quality brand is Bioptemizers since it contains all 7 types of magnesium, otherwise over the counter magnesium gluconate '400mg'$  is a good option 2) B vitamins 3) vitamin D 4) potassium 5) CoQ10 6) L-arginine 7) Vitamin C 8) Beetroot -Educated that goal BP is 120/80. -Made goal to incorporate some of the above foods into diet.

## 2022-10-03 NOTE — Patient Instructions (Addendum)
[facility] red light therapy [place] Hyperbaric oxygen    HTN: -BP is ___today.  -Advised checking BP daily at home and logging results to bring into follow-up appointment with PCP and myself. -Reviewed BP meds today.  -Advised regarding healthy foods that can help lower blood pressure and provided with a list: 1) citrus foods- high in vitamins and minerals 2) salmon and other fatty fish - reduces inflammation and oxylipins 3) swiss chard (leafy green)- high level of nitrates 4) pumpkin seeds- one of the best natural sources of magnesium 5) Beans and lentils- high in fiber, magnesium, and potassium 6) Berries- high in flavonoids 7) Amaranth (whole grain, can be cooked similarly to rice and oats)- high in magnesium and fiber 8) Pistachios- even more effective at reducing BP than other nuts 9) Carrots- high in phenolic compounds that relax blood vessels and reduce inflammation 10) Celery- contain phthalides that relax tissues of arterial walls 11) Tomatoes- can also improve cholesterol and reduce risk of heart disease 12) Broccoli- good source of magnesium, calcium, and potassium 13) Greek yogurt: high in potassium and calcium 14) Herbs and spices: Celery seed, cilantro, saffron, lemongrass, black cumin, ginseng, cinnamon, cardamom, sweet basil, and ginger 15) Chia and flax seeds- also help to lower cholesterol and blood sugar 16) Beets- high levels of nitrates that relax blood vessels  17) spinach and bananas- high in potassium  -Provided lise of supplements that can help with hypertension:  1) magnesium: one high quality brand is Bioptemizers since it contains all 7 types of magnesium, otherwise over the counter magnesium gluconate '400mg'$  is a good option 2) B vitamins 3) vitamin D 4) potassium 5) CoQ10 6) L-arginine 7) Vitamin C 8) Beetroot -Educated that goal BP is 120/80. -Made goal to incorporate some of the above foods into diet.     Diabetes: -check CBGs daily, log, and  bring log to follow-up appointment -avoid sugar, bread, pasta, rice -avoid snacking -perform daily foot exam and at least annual eye exam -try to incorporate into your diet some of the following foods which are good for diabetes: 1) cinnamon- imitates effects of insulin, increasing glucose transport into cells ([place] or [place] cinnamon is best, least processed) 2) nuts- can slow down the blood sugar response of carbohydrate rich foods 3) oatmeal- contains and anti-inflammatory compound avenanthramide 4) whole-milk yogurt (best types are no sugar, [place] yogurt, or goat/sheep yogurt) 5) beans- high in protein, fiber, and vitamins, low glycemic index 6) broccoli- great source of vitamin A and C 7) quinoa- higher in protein and fiber than other grains 8) spinach- high in vitamin A, fiber, and protein 9) olive oil- reduces glucose levels, LDL, and triglycerides 10) salmon- excellent amount of omega-3-fatty acids 11) walnuts- rich in antioxidants 12) apples- high in fiber and quercetin 13) carrots- highly nutritious with low impact on blood sugar 14) eggs- improve HDL (good cholesterol), high in protein, keep you satiated 15) turmeric: improves blood sugars, cardiovascular disease, and protects kidney health 16) garlic: improves blood sugar, blood pressure, pain 17) tomatoes: highly nutritious with low impact on blood sugar

## 2022-10-14 ENCOUNTER — Encounter: Payer: Self-pay | Admitting: Orthopedic Surgery

## 2022-10-14 ENCOUNTER — Ambulatory Visit (INDEPENDENT_AMBULATORY_CARE_PROVIDER_SITE_OTHER): Payer: Medicare Other | Admitting: Orthopedic Surgery

## 2022-10-14 DIAGNOSIS — Z89432 Acquired absence of left foot: Secondary | ICD-10-CM

## 2022-10-14 NOTE — Progress Notes (Signed)
Office Visit Note   Patient: Oscar [name].           Date of Birth: [birth year]           MRN: [id number] Visit Date: 09/30/2022              Requested by: Kathalene Frames, MD [address], [place],  [place] [address] PCP: Kathalene Frames, MD  Chief Complaint  Patient presents with   Left Foot - Routine Post Op    07/24/2023 left transmet amputation       HPI: Patient is a 77 year old gentleman 79-monthstatus post left transmetatarsal amputation.  Assessment & Plan: Visit Diagnoses:  1. History of transmetatarsal amputation of left foot (HCC)     Plan: Continue dry dressing changes with Dial soap cleansing  Follow-Up Instructions: Return in about 2 weeks (around 10/14/2022).   Ortho Exam  Patient is alert, oriented, no adenopathy, well-dressed, normal affect, normal respiratory effort. Examination patient has a 10 mm in diameter heel ulcer with healthy granulation tissue.  The transmetatarsal amputation is healed all the other ulcers have healed.  Imaging: No results found. No images are attached to the encounter.  Labs: Lab Results  Component Value Date   HGBA1C 7.5 (H) 07/23/2022   HGBA1C 10.1 (H) 09/08/2016   HGBA1C 9.1 (H) 08/28/2014   ESRSEDRATE 75 (H) 08/28/2014   ESRSEDRATE 42 (H) 11/17/2012   CRP 12.9 (H) 08/28/2014   LABURIC 3.0 (L) 08/31/2014   REPTSTATUS 10/03/2017 FINAL 09/30/2017   GRAMSTAIN  09/30/2017    RARE WBC PRESENT, PREDOMINANTLY MONONUCLEAR NO ORGANISMS SEEN    CULT  09/30/2017    NO GROWTH 2 DAYS Performed at [facility] [id number], [place] [id number]     Lab Results  Component Value Date   ALBUMIN 2.4 (L) 07/30/2022   ALBUMIN 3.3 (L) 09/07/2016   ALBUMIN 3.6 09/15/2014    No results found for: "MG" No results found for: "VD25OH"  No results found for: "PREALBUMIN"    Latest Ref Rng & Units 07/30/2022    5:19 AM 07/23/2022    8:35 AM 12/26/2021    7:51 AM  CBC  EXTENDED  WBC 4.0 - 10.5 K/uL 8.3  7.3  7.5   RBC 4.22 - 5.81 MIL/uL 3.77  4.22  4.07   Hemoglobin 13.0 - 17.0 g/dL 11.5  13.2  12.7   HCT 39.0 - 52.0 % 36.6  40.0  39.7   Platelets 150 - 400 K/uL 278  204  195   NEUT# 1.7 - 7.7 K/uL 5.9     Lymph# 0.7 - 4.0 K/uL 0.8        There is no height or weight on file to calculate BMI.  Orders:  No orders of the defined types were placed in this encounter.  No orders of the defined types were placed in this encounter.    Procedures: No procedures performed  Clinical Data: No additional findings.  ROS:  All other systems negative, except as noted in the HPI. Review of Systems  Objective: Vital Signs: There were no vitals taken for this visit.  Specialty Comments:  No specialty comments available.  PMFS History: Patient Active Problem List   Diagnosis Date Noted   Osteomyelitis of left lower extremity ([facility] 07/29/2022   History of transmetatarsal amputation of left foot ([place] 07/23/2022   Pain in left foot 04/21/2018   PVD (peripheral vascular disease) ([place] 04/21/2018  Amputated toe, left ([place]) 01/21/2018   Achilles tendon contracture, left 12/28/2017   Dehiscence of amputation stump ([place]) 11/10/2017   Great toe amputation status, left 11/10/2017   Memory change 04/14/2017   Cutaneous abscess of left foot 04/09/2017   Non-pressure chronic ulcer of right heel and midfoot limited to breakdown of skin ([place]) 10/07/2016   Influenza A    AKI (acute kidney injury) ([place]) 09/07/2016   Acute encephalopathy 09/07/2016   Acute respiratory failure with hypoxia ([place]) 09/07/2016   Elevated lactic acid level 09/07/2016   Elevated troponin 09/07/2016   Coronary artery calcification seen on CT scan 10/28/2015   Acute on chronic diastolic (congestive) heart failure ([place]) 09/26/2015   OSA (obstructive sleep apnea) 02/07/2015   Sepsis ([place]) 08/28/2014   Chronic diastolic heart failure ([place]) 09/21/2013   CVA (cerebral infarction)  08/08/2013   Dizziness 08/07/2013   TIA (transient ischemic attack) 08/07/2013   Atrial fibrillation ([place]) 08/07/2013   Hypertension 08/07/2013   Anemia 08/07/2013   Dyspnea 06/21/2013   Chest pain 11/16/2012   Atrial fibrillation with RVR ([place]) 11/16/2012   Gout 11/16/2012   Diabetes mellitus type 2, uncontrolled, with complications ([place]) Q000111Q   CKD (chronic kidney disease), stage II 11/16/2012   Atypical atrial flutter ([place]) 11/16/2012   Pleuritic chest pain 11/16/2012   Morbid obesity ([place]) 11/16/2012   Obstructive sleep apnea 11/16/2012   Past Medical History:  Diagnosis Date   Anal fissure    h/o - no recent complications   Arthritis    hips   Atrial fibrillation (HCC)    CKD (chronic kidney disease)    Depression    Diabetes mellitus without complication ([place])    Type II   Enlarged prostate    GERD (gastroesophageal reflux disease)    Gout    H/O hiatal hernia    Hard of hearing    History of kidney stones    Hx of typhoid fever    as a child   Hypertension    improved after diet/exercise   Memory change 04/14/2017   OSA (obstructive sleep apnea)    wears cpap   Pneumonia    PONV (postoperative nausea and vomiting)    also difficulty waking up   Stroke ([place]) 08/2013   short term memory loss (slight)    Family History  Problem Relation Age of Onset   COPD Father    Heart attack Sister        died age 35 of MI    Past Surgical History:  Procedure Laterality Date   AMPUTATION Left 10/02/2017   Procedure: LEFT FOOT 1ST RAY AMPUTATION;  Surgeon: Newt Minion, MD;  Location: [facility];  Service: Orthopedics;  Laterality: Left;   AMPUTATION Left 01/15/2018   Procedure: LEFT FOOT 2ND TOE AMPUTATION;  Surgeon: Newt Minion, MD;  Location: [facility];  Service: Orthopedics;  Laterality: Left;   AMPUTATION Left 07/23/2022   Procedure: LEFT TRANSMETATARSAL AMPUTATION;  Surgeon: Newt Minion, MD;  Location: [facility];  Service: Orthopedics;  Laterality: Left;    CARPAL TUNNEL RELEASE Left 12/26/2021   Procedure: CARPAL TUNNEL RELEASE;  Surgeon: Leanora Cover, MD;  Location: [facility];  Service: Orthopedics;  Laterality: Left;   carpel tunnel Bilateral    CATARACT EXTRACTION W/ INTRAOCULAR LENS  IMPLANT, BILATERAL     CHOLECYSTECTOMY     COLONOSCOPY     EYE SURGERY     laser surgery    I & D EXTREMITY Left 09/15/2014  Procedure: Excision Necrotic Left Achilles, Skin Graft, apply Wound VAC;  Surgeon: Newt Minion, MD;  Location: [facility];  Service: Orthopedics;  Laterality: Left;   KIDNEY STONE SURGERY     KNEE ARTHROSCOPY Bilateral    LIGAMENT REPAIR     TONSILLECTOMY     VASECTOMY     Social History   Occupational History   Occupation: Retired  Tobacco Use   Smoking status: Never   Smokeless tobacco: Never  Vaping Use   Vaping Use: Never used  Substance and Sexual Activity   Alcohol use: Yes    Alcohol/week: 0.0 standard drinks of alcohol    Comment: rare - 1-2x /yr   Drug use: No   Sexual activity: Not on file

## 2022-11-03 ENCOUNTER — Encounter: Payer: Self-pay | Admitting: Orthopedic Surgery

## 2022-11-03 NOTE — Progress Notes (Signed)
Office Visit Note   Patient: Oscar [name].           Date of Birth: 1945-09-22           MRN: [id number] Visit Date: 10/14/2022              Requested by: Kathalene Frames, MD [address] [place],  [place] [address] PCP: Kathalene Frames, MD  Chief Complaint  Patient presents with   Left Foot - Routine Post Op    07/24/2023 left transmet amputation      HPI: Patient is a 77 year old gentleman who presents in follow-up status post transmetatarsal amputation December 13.  Assessment & Plan: Visit Diagnoses:  1. History of transmetatarsal amputation of left foot ([place])     Plan: Patient continues to show improvement of the Achilles ulcer.  Continue with current wound care and pressure offloading.  Follow-Up Instructions: Return in about 4 weeks (around 11/11/2022).   Ortho Exam  Patient is alert, oriented, no adenopathy, well-dressed, normal affect, normal respiratory effort. Examination patient's dorsal ankle ulcer has healed the transmetatarsal amputation is healed he has a superficial granulating wound over the Achilles that is 10 mm in diameter there is healthy granulation tissue no exposed tendon.  Imaging: No results found. No images are attached to the encounter.  Labs: Lab Results  Component Value Date   HGBA1C 7.5 (H) 07/23/2022   HGBA1C 10.1 (H) 09/08/2016   HGBA1C 9.1 (H) 08/28/2014   ESRSEDRATE 75 (H) 08/28/2014   ESRSEDRATE 42 (H) 11/17/2012   CRP 12.9 (H) 08/28/2014   LABURIC 3.0 (L) 08/31/2014   REPTSTATUS 10/03/2017 FINAL 09/30/2017   GRAMSTAIN  09/30/2017    RARE WBC PRESENT, PREDOMINANTLY MONONUCLEAR NO ORGANISMS SEEN    CULT  09/30/2017    NO GROWTH 2 DAYS Performed at [facility], [place] [address], [place], [place] [address]      Lab Results  Component Value Date   ALBUMIN 2.4 (L) 07/30/2022   ALBUMIN 3.3 (L) 09/07/2016   ALBUMIN 3.6 09/15/2014    No results found for: "MG" No results found  for: "VD25OH"  No results found for: "PREALBUMIN"    Latest Ref Rng & Units 07/30/2022    5:19 AM 07/23/2022    8:35 AM 12/26/2021    7:51 AM  CBC EXTENDED  WBC 4.0 - 10.5 K/uL 8.3  7.3  7.5   RBC 4.22 - 5.81 MIL/uL 3.77  4.22  4.07   Hemoglobin 13.0 - 17.0 g/dL 11.5  13.2  12.7   HCT 39.0 - 52.0 % 36.6  40.0  39.7   Platelets 150 - 400 K/uL 278  204  195   NEUT# 1.7 - 7.7 K/uL 5.9     Lymph# 0.7 - 4.0 K/uL 0.8        There is no height or weight on file to calculate BMI.  Orders:  No orders of the defined types were placed in this encounter.  No orders of the defined types were placed in this encounter.    Procedures: No procedures performed  Clinical Data: No additional findings.  ROS:  All other systems negative, except as noted in the HPI. Review of Systems  Objective: Vital Signs: There were no vitals taken for this visit.  Specialty Comments:  No specialty comments available.  PMFS History: Patient Active Problem List   Diagnosis Date Noted   Osteomyelitis of left lower extremity ([place]) 07/29/2022   History of  transmetatarsal amputation of left foot ([place]) 07/23/2022   Pain in left foot 04/21/2018   PVD (peripheral vascular disease) ([place]) 04/21/2018   Amputated toe, left ([place]) 01/21/2018   Achilles tendon contracture, left 12/28/2017   Dehiscence of amputation stump ([place]) 11/10/2017   Great toe amputation status, left 11/10/2017   Memory change 04/14/2017   Cutaneous abscess of left foot 04/09/2017   Non-pressure chronic ulcer of right heel and midfoot limited to breakdown of skin ([place]) 10/07/2016   Influenza A    AKI (acute kidney injury) ([place]) 09/07/2016   Acute encephalopathy 09/07/2016   Acute respiratory failure with hypoxia (HCC) 09/07/2016   Elevated lactic acid level 09/07/2016   Elevated troponin 09/07/2016   Coronary artery calcification seen on CT scan 10/28/2015   Acute on chronic diastolic (congestive) heart failure ([place]) 09/26/2015    OSA (obstructive sleep apnea) 02/07/2015   Sepsis ([place]) 08/28/2014   Chronic diastolic heart failure ([place]) 09/21/2013   CVA (cerebral infarction) 08/08/2013   Dizziness 08/07/2013   TIA (transient ischemic attack) 08/07/2013   Atrial fibrillation ([place]) 08/07/2013   Hypertension 08/07/2013   Anemia 08/07/2013   Dyspnea 06/21/2013   Chest pain 11/16/2012   Atrial fibrillation with RVR ([place]) 11/16/2012   Gout 11/16/2012   Diabetes mellitus type 2, uncontrolled, with complications ([place]) Q000111Q   CKD (chronic kidney disease), stage II 11/16/2012   Atypical atrial flutter ([place]) 11/16/2012   Pleuritic chest pain 11/16/2012   Morbid obesity ([place]) 11/16/2012   Obstructive sleep apnea 11/16/2012   Past Medical History:  Diagnosis Date   Anal fissure    h/o - no recent complications   Arthritis    hips   Atrial fibrillation (HCC)    CKD (chronic kidney disease)    Depression    Diabetes mellitus without complication ([place])    Type II   Enlarged prostate    GERD (gastroesophageal reflux disease)    Gout    H/O hiatal hernia    Hard of hearing    History of kidney stones    Hx of typhoid fever    as a child   Hypertension    improved after diet/exercise   Memory change 04/14/2017   OSA (obstructive sleep apnea)    wears cpap   Pneumonia    PONV (postoperative nausea and vomiting)    also difficulty waking up   Stroke ([place]) 08/2013   short term memory loss (slight)    Family History  Problem Relation Age of Onset   COPD Father    Heart attack Sister        died age 91 of MI    Past Surgical History:  Procedure Laterality Date   AMPUTATION Left 10/02/2017   Procedure: LEFT FOOT 1ST RAY AMPUTATION;  Surgeon: Newt Minion, MD;  Location: [facility];  Service: Orthopedics;  Laterality: Left;   AMPUTATION Left 01/15/2018   Procedure: LEFT FOOT 2ND TOE AMPUTATION;  Surgeon: Newt Minion, MD;  Location: [facility];  Service: Orthopedics;  Laterality: Left;   AMPUTATION Left  07/23/2022   Procedure: LEFT TRANSMETATARSAL AMPUTATION;  Surgeon: Newt Minion, MD;  Location: [facility];  Service: Orthopedics;  Laterality: Left;   CARPAL TUNNEL RELEASE Left 12/26/2021   Procedure: CARPAL TUNNEL RELEASE;  Surgeon: Leanora Cover, MD;  Location: [facility];  Service: Orthopedics;  Laterality: Left;   carpel tunnel Bilateral    CATARACT EXTRACTION W/ INTRAOCULAR LENS  IMPLANT, BILATERAL     CHOLECYSTECTOMY  COLONOSCOPY     EYE SURGERY     laser surgery    I & D EXTREMITY Left 09/15/2014   Procedure: Excision Necrotic Left Achilles, Skin Graft, apply Wound VAC;  Surgeon: Newt Minion, MD;  Location: [facility];  Service: Orthopedics;  Laterality: Left;   KIDNEY STONE SURGERY     KNEE ARTHROSCOPY Bilateral    LIGAMENT REPAIR     TONSILLECTOMY     VASECTOMY     Social History   Occupational History   Occupation: Retired  Tobacco Use   Smoking status: Never   Smokeless tobacco: Never  Vaping Use   Vaping Use: Never used  Substance and Sexual Activity   Alcohol use: Yes    Alcohol/week: 0.0 standard drinks of alcohol    Comment: rare - 1-2x /yr   Drug use: No   Sexual activity: Not on file

## 2022-11-11 ENCOUNTER — Ambulatory Visit (INDEPENDENT_AMBULATORY_CARE_PROVIDER_SITE_OTHER): Payer: Medicare Other | Admitting: Orthopedic Surgery

## 2022-11-11 DIAGNOSIS — Z89432 Acquired absence of left foot: Secondary | ICD-10-CM | POA: Diagnosis not present

## 2022-11-11 DIAGNOSIS — L97521 Non-pressure chronic ulcer of other part of left foot limited to breakdown of skin: Secondary | ICD-10-CM

## 2022-11-15 DIAGNOSIS — N400 Enlarged prostate without lower urinary tract symptoms: Secondary | ICD-10-CM | POA: Diagnosis not present

## 2022-11-15 DIAGNOSIS — M109 Gout, unspecified: Secondary | ICD-10-CM | POA: Diagnosis not present

## 2022-11-15 DIAGNOSIS — Z6838 Body mass index (BMI) 38.0-38.9, adult: Secondary | ICD-10-CM | POA: Diagnosis not present

## 2022-11-15 DIAGNOSIS — E1151 Type 2 diabetes mellitus with diabetic peripheral angiopathy without gangrene: Secondary | ICD-10-CM | POA: Diagnosis not present

## 2022-11-15 DIAGNOSIS — F32A Depression, unspecified: Secondary | ICD-10-CM | POA: Diagnosis not present

## 2022-11-15 DIAGNOSIS — M6702 Short Achilles tendon (acquired), left ankle: Secondary | ICD-10-CM | POA: Diagnosis not present

## 2022-11-15 DIAGNOSIS — M1990 Unspecified osteoarthritis, unspecified site: Secondary | ICD-10-CM | POA: Diagnosis not present

## 2022-11-15 DIAGNOSIS — Z8709 Personal history of other diseases of the respiratory system: Secondary | ICD-10-CM | POA: Diagnosis not present

## 2022-11-15 DIAGNOSIS — I130 Hypertensive heart and chronic kidney disease with heart failure and stage 1 through stage 4 chronic kidney disease, or unspecified chronic kidney disease: Secondary | ICD-10-CM | POA: Diagnosis not present

## 2022-11-15 DIAGNOSIS — L97429 Non-pressure chronic ulcer of left heel and midfoot with unspecified severity: Secondary | ICD-10-CM | POA: Diagnosis not present

## 2022-11-15 DIAGNOSIS — I4892 Unspecified atrial flutter: Secondary | ICD-10-CM | POA: Diagnosis not present

## 2022-11-15 DIAGNOSIS — I69311 Memory deficit following cerebral infarction: Secondary | ICD-10-CM | POA: Diagnosis not present

## 2022-11-15 DIAGNOSIS — I872 Venous insufficiency (chronic) (peripheral): Secondary | ICD-10-CM | POA: Diagnosis not present

## 2022-11-15 DIAGNOSIS — E11621 Type 2 diabetes mellitus with foot ulcer: Secondary | ICD-10-CM | POA: Diagnosis not present

## 2022-11-15 DIAGNOSIS — I4891 Unspecified atrial fibrillation: Secondary | ICD-10-CM | POA: Diagnosis not present

## 2022-11-15 DIAGNOSIS — N182 Chronic kidney disease, stage 2 (mild): Secondary | ICD-10-CM | POA: Diagnosis not present

## 2022-11-15 DIAGNOSIS — D631 Anemia in chronic kidney disease: Secondary | ICD-10-CM | POA: Diagnosis not present

## 2022-11-15 DIAGNOSIS — I5033 Acute on chronic diastolic (congestive) heart failure: Secondary | ICD-10-CM | POA: Diagnosis not present

## 2022-11-15 DIAGNOSIS — G4733 Obstructive sleep apnea (adult) (pediatric): Secondary | ICD-10-CM | POA: Diagnosis not present

## 2022-11-15 DIAGNOSIS — H9190 Unspecified hearing loss, unspecified ear: Secondary | ICD-10-CM | POA: Diagnosis not present

## 2022-11-15 DIAGNOSIS — E1122 Type 2 diabetes mellitus with diabetic chronic kidney disease: Secondary | ICD-10-CM | POA: Diagnosis not present

## 2022-11-15 DIAGNOSIS — K219 Gastro-esophageal reflux disease without esophagitis: Secondary | ICD-10-CM | POA: Diagnosis not present

## 2022-11-15 DIAGNOSIS — Z4781 Encounter for orthopedic aftercare following surgical amputation: Secondary | ICD-10-CM | POA: Diagnosis not present

## 2022-11-15 DIAGNOSIS — Z8673 Personal history of transient ischemic attack (TIA), and cerebral infarction without residual deficits: Secondary | ICD-10-CM | POA: Diagnosis not present

## 2022-11-16 ENCOUNTER — Encounter: Payer: Self-pay | Admitting: Orthopedic Surgery

## 2022-11-16 NOTE — Progress Notes (Signed)
Office Visit Note   Patient: Oscar LeberFrank R Abdo Jr.           Date of Birth: 1946/06/15           MRN: [id number] Visit Date: 11/11/2022              Requested by: Emilio AspenHenderson, Jonathan A, MD [address] [place],  [place] [id number] PCP: Emilio AspenHenderson, Jonathan A, MD  Chief Complaint  Patient presents with   Left Foot - Follow-up    07/23/2022 left transmet amputation      HPI: Patient is a 77 year old gentleman left transmetatarsal amputation with an Achilles ulcer.  He is 4 months out from the transmetatarsal amputation.  Assessment & Plan: Visit Diagnoses:  1. History of transmetatarsal amputation of left foot   2. Skin ulcer of plantar aspect of foot, left, limited to breakdown of skin     Plan: Will request home health physical therapy for gait training.  Follow-Up Instructions: Return in about 4 weeks (around 12/09/2022).   Ortho Exam  Patient is alert, oriented, no adenopathy, well-dressed, normal affect, normal respiratory effort. Patient has hypergranulation tissue over the heel ulcer that is 5 mm in diameter this was touched with silver nitrate.  The dorsal foot ulcer  Imaging: No results found. No images are attached to the enc server has healed the transmetatarsal amputation is healed.  [place].  Labs: Lab Results  Component Value Date   HGBA1C 7.5 (H) 07/23/2022   HGBA1C 10.1 (H) 09/08/2016   HGBA1C 9.1 (H) 08/28/2014   ESRSEDRATE 75 (H) 08/28/2014   ESRSEDRATE 42 (H) 11/17/2012   CRP 12.9 (H) 08/28/2014   LABURIC 3.0 (L) 08/31/2014   REPTSTATUS 10/03/2017 FINAL 09/30/2017   GRAMSTAIN  09/30/2017    RARE WBC PRESENT, PREDOMINANTLY MONONUCLEAR NO ORGANISMS SEEN    CULT  09/30/2017    NO GROWTH 2 DAYS Performed at [facility], [address] [place], [place] [address]      Lab Results  Component Value Date   ALBUMIN 2.4 (L) 07/30/2022   ALBUMIN 3.3 (L) 09/07/2016   ALBUMIN 3.6 09/15/2014    No results found for: "MG" No  results found for: "VD25OH"  No results found for: "PREALBUMIN"    Latest Ref Rng & Units 07/30/2022    5:19 AM 07/23/2022    8:35 AM 12/26/2021    7:51 AM  CBC EXTENDED  WBC 4.0 - 10.5 K/uL 8.3  7.3  7.5   RBC 4.22 - 5.81 MIL/uL 3.77  4.22  4.07   Hemoglobin 13.0 - 17.0 g/dL 62.111.5  30.813.2  65.712.7   HCT 39.0 - 52.0 % 36.6  40.0  39.7   Platelets 150 - 400 K/uL 278  204  195   NEUT# 1.7 - 7.7 K/uL 5.9     Lymph# 0.7 - 4.0 K/uL 0.8        There is no height or weight on file to calculate BMI.  Orders:  Orders Placed This Encounter  Procedures   Ambulatory referral to Home Health   No orders of the defined types were placed in this encounter.    Procedures: No procedures performed  Clinical Data: No additional findings.  ROS:  All other systems negative, except as noted in the HPI. Review of Systems  Objective: Vital Signs: There were no vitals taken for this visit.  Specialty Comments:  No specialty comments available.  PMFS History: Patient Active Problem List   Diagnosis Date  Noted   Osteomyelitis of left lower extremity 07/29/2022   History of transmetatarsal amputation of left foot 07/23/2022   Pain in left foot 04/21/2018   PVD (peripheral vascular disease) 04/21/2018   Amputated toe, left 01/21/2018   Achilles tendon contracture, left 12/28/2017   Dehiscence of amputation stump 11/10/2017   Great toe amputation status, left 11/10/2017   Memory change 04/14/2017   Cutaneous abscess of left foot 04/09/2017   Non-pressure chronic ulcer of right heel and midfoot limited to breakdown of skin 10/07/2016   Influenza A    AKI (acute kidney injury) 09/07/2016   Acute encephalopathy 09/07/2016   Acute respiratory failure with hypoxia 09/07/2016   Elevated lactic acid level 09/07/2016   Elevated troponin 09/07/2016   Coronary artery calcification seen on CT scan 10/28/2015   Acute on chronic diastolic (congestive) heart failure 09/26/2015   OSA (obstructive  sleep apnea) 02/07/2015   Sepsis 08/28/2014   Chronic diastolic heart failure 09/21/2013   CVA (cerebral infarction) 08/08/2013   Dizziness 08/07/2013   TIA (transient ischemic attack) 08/07/2013   Atrial fibrillation 08/07/2013   Hypertension 08/07/2013   Anemia 08/07/2013   Dyspnea 06/21/2013   Chest pain 11/16/2012   Atrial fibrillation with RVR 11/16/2012   Gout 11/16/2012   Diabetes mellitus type 2, uncontrolled, with complications (HCC) 11/16/2012   CKD (chronic kidney disease), stage II 11/16/2012   Atypical atrial flutter 11/16/2012   Pleuritic chest pain 11/16/2012   Morbid obesity 11/16/2012   Obstructive sleep apnea 11/16/2012   Past Medical History:  Diagnosis Date   Anal fissure    h/o - no recent complications   Arthritis    hips   Atrial fibrillation    CKD (chronic kidney disease)    Depression    Diabetes mellitus without complication    Type II   Enlarged prostate    GERD (gastroesophageal reflux disease)    Gout    H/O hiatal hernia    Hard of hearing    History of kidney stones    Hx of typhoid fever    as a child   Hypertension    improved after diet/exercise   Memory change 04/14/2017   OSA (obstructive sleep apnea)    wears cpap   Pneumonia    PONV (postoperative nausea and vomiting)    also difficulty waking up   Stroke 08/2013   short term memory loss (slight)    Family History  Problem Relation Age of Onset   COPD Father    Heart attack Sister        died age 53 of MI    Past Surgical History:  Procedure Laterality Date   AMPUTATION Left 10/02/2017   Procedure: LEFT FOOT 1ST RAY AMPUTATION;  Surgeon: Nadara Mustard, MD;  [facility];  Service: Orthopedics;  Laterality: Left;   AMPUTATION Left 01/15/2018   Procedure: LEFT FOOT 2ND TOE AMPUTATION;  Surgeon: Nadara Mustard, MD;  Location[facility];  Service: Orthopedics;  Laterality: Left;   AMPUTATION Left 07/23/2022   Procedure: LEFT TRANSMETATARSAL AMPUTATION;  Surgeon: Nadara Mustard, MD;  Location[facility];  Service: Orthopedics;  Laterality: Left;   CARPAL TUNNEL RELEASE Left 12/26/2021   Procedure: CARPAL TUNNEL RELEASE;  Surgeon: Betha Loa, MD;  [facility];  Service: Orthopedics;  Laterality: Left;   carpel tunnel Bilateral    CATARACT EXTRACTION W/ INTRAOCULAR LENS  IMPLANT, BILATERAL     CHOLECYSTECTOMY     COLONOSCOPY  EYE SURGERY     laser surgery    I & D EXTREMITY Left 09/15/2014   Procedure: Excision Necrotic Left Achilles, Skin Graft, apply Wound VAC;  Surgeon: Nadara Mustard, MD;  [facility];  Service: Orthopedics;  Laterality: Left;   KIDNEY STONE SURGERY     KNEE ARTHROSCOPY Bilateral    LIGAMENT REPAIR     TONSILLECTOMY     VASECTOMY     Social History   Occupational History   Occupation: Retired  Tobacco Use   Smoking status: Never   Smokeless tobacco: Never  Vaping Use   Vaping Use: Never used  Substance and Sexual Activity   Alcohol use: Yes    Alcohol/week: 0.0 standard drinks of alcohol    Comment: rare - 1-2x /yr   Drug use: No   Sexual activity: Not on file

## 2022-11-18 DIAGNOSIS — E1151 Type 2 diabetes mellitus with diabetic peripheral angiopathy without gangrene: Secondary | ICD-10-CM | POA: Diagnosis not present

## 2022-11-18 DIAGNOSIS — Z4781 Encounter for orthopedic aftercare following surgical amputation: Secondary | ICD-10-CM | POA: Diagnosis not present

## 2022-11-18 DIAGNOSIS — I130 Hypertensive heart and chronic kidney disease with heart failure and stage 1 through stage 4 chronic kidney disease, or unspecified chronic kidney disease: Secondary | ICD-10-CM | POA: Diagnosis not present

## 2022-11-18 DIAGNOSIS — E11621 Type 2 diabetes mellitus with foot ulcer: Secondary | ICD-10-CM | POA: Diagnosis not present

## 2022-11-18 DIAGNOSIS — L97429 Non-pressure chronic ulcer of left heel and midfoot with unspecified severity: Secondary | ICD-10-CM | POA: Diagnosis not present

## 2022-11-18 DIAGNOSIS — I872 Venous insufficiency (chronic) (peripheral): Secondary | ICD-10-CM | POA: Diagnosis not present

## 2022-11-20 DIAGNOSIS — E11621 Type 2 diabetes mellitus with foot ulcer: Secondary | ICD-10-CM | POA: Diagnosis not present

## 2022-11-20 DIAGNOSIS — I130 Hypertensive heart and chronic kidney disease with heart failure and stage 1 through stage 4 chronic kidney disease, or unspecified chronic kidney disease: Secondary | ICD-10-CM | POA: Diagnosis not present

## 2022-11-20 DIAGNOSIS — Z4781 Encounter for orthopedic aftercare following surgical amputation: Secondary | ICD-10-CM | POA: Diagnosis not present

## 2022-11-20 DIAGNOSIS — L97429 Non-pressure chronic ulcer of left heel and midfoot with unspecified severity: Secondary | ICD-10-CM | POA: Diagnosis not present

## 2022-11-20 DIAGNOSIS — E1151 Type 2 diabetes mellitus with diabetic peripheral angiopathy without gangrene: Secondary | ICD-10-CM | POA: Diagnosis not present

## 2022-11-20 DIAGNOSIS — I872 Venous insufficiency (chronic) (peripheral): Secondary | ICD-10-CM | POA: Diagnosis not present

## 2022-11-25 DIAGNOSIS — E1151 Type 2 diabetes mellitus with diabetic peripheral angiopathy without gangrene: Secondary | ICD-10-CM | POA: Diagnosis not present

## 2022-11-25 DIAGNOSIS — I130 Hypertensive heart and chronic kidney disease with heart failure and stage 1 through stage 4 chronic kidney disease, or unspecified chronic kidney disease: Secondary | ICD-10-CM | POA: Diagnosis not present

## 2022-11-25 DIAGNOSIS — E11621 Type 2 diabetes mellitus with foot ulcer: Secondary | ICD-10-CM | POA: Diagnosis not present

## 2022-11-25 DIAGNOSIS — Z4781 Encounter for orthopedic aftercare following surgical amputation: Secondary | ICD-10-CM | POA: Diagnosis not present

## 2022-11-25 DIAGNOSIS — L97429 Non-pressure chronic ulcer of left heel and midfoot with unspecified severity: Secondary | ICD-10-CM | POA: Diagnosis not present

## 2022-11-25 DIAGNOSIS — I872 Venous insufficiency (chronic) (peripheral): Secondary | ICD-10-CM | POA: Diagnosis not present

## 2022-11-27 DIAGNOSIS — L97429 Non-pressure chronic ulcer of left heel and midfoot with unspecified severity: Secondary | ICD-10-CM | POA: Diagnosis not present

## 2022-11-27 DIAGNOSIS — E11621 Type 2 diabetes mellitus with foot ulcer: Secondary | ICD-10-CM | POA: Diagnosis not present

## 2022-11-27 DIAGNOSIS — Z4781 Encounter for orthopedic aftercare following surgical amputation: Secondary | ICD-10-CM | POA: Diagnosis not present

## 2022-11-27 DIAGNOSIS — E1151 Type 2 diabetes mellitus with diabetic peripheral angiopathy without gangrene: Secondary | ICD-10-CM | POA: Diagnosis not present

## 2022-11-27 DIAGNOSIS — I872 Venous insufficiency (chronic) (peripheral): Secondary | ICD-10-CM | POA: Diagnosis not present

## 2022-11-27 DIAGNOSIS — I130 Hypertensive heart and chronic kidney disease with heart failure and stage 1 through stage 4 chronic kidney disease, or unspecified chronic kidney disease: Secondary | ICD-10-CM | POA: Diagnosis not present

## 2022-12-03 DIAGNOSIS — Z4781 Encounter for orthopedic aftercare following surgical amputation: Secondary | ICD-10-CM | POA: Diagnosis not present

## 2022-12-03 DIAGNOSIS — I130 Hypertensive heart and chronic kidney disease with heart failure and stage 1 through stage 4 chronic kidney disease, or unspecified chronic kidney disease: Secondary | ICD-10-CM | POA: Diagnosis not present

## 2022-12-03 DIAGNOSIS — E11621 Type 2 diabetes mellitus with foot ulcer: Secondary | ICD-10-CM | POA: Diagnosis not present

## 2022-12-03 DIAGNOSIS — I872 Venous insufficiency (chronic) (peripheral): Secondary | ICD-10-CM | POA: Diagnosis not present

## 2022-12-03 DIAGNOSIS — E1151 Type 2 diabetes mellitus with diabetic peripheral angiopathy without gangrene: Secondary | ICD-10-CM | POA: Diagnosis not present

## 2022-12-03 DIAGNOSIS — L97429 Non-pressure chronic ulcer of left heel and midfoot with unspecified severity: Secondary | ICD-10-CM | POA: Diagnosis not present

## 2022-12-10 DIAGNOSIS — I872 Venous insufficiency (chronic) (peripheral): Secondary | ICD-10-CM | POA: Diagnosis not present

## 2022-12-10 DIAGNOSIS — L97429 Non-pressure chronic ulcer of left heel and midfoot with unspecified severity: Secondary | ICD-10-CM | POA: Diagnosis not present

## 2022-12-10 DIAGNOSIS — Z4781 Encounter for orthopedic aftercare following surgical amputation: Secondary | ICD-10-CM | POA: Diagnosis not present

## 2022-12-10 DIAGNOSIS — E11621 Type 2 diabetes mellitus with foot ulcer: Secondary | ICD-10-CM | POA: Diagnosis not present

## 2022-12-10 DIAGNOSIS — E1151 Type 2 diabetes mellitus with diabetic peripheral angiopathy without gangrene: Secondary | ICD-10-CM | POA: Diagnosis not present

## 2022-12-10 DIAGNOSIS — I130 Hypertensive heart and chronic kidney disease with heart failure and stage 1 through stage 4 chronic kidney disease, or unspecified chronic kidney disease: Secondary | ICD-10-CM | POA: Diagnosis not present

## 2022-12-12 DIAGNOSIS — E782 Mixed hyperlipidemia: Secondary | ICD-10-CM | POA: Diagnosis not present

## 2022-12-12 DIAGNOSIS — E1151 Type 2 diabetes mellitus with diabetic peripheral angiopathy without gangrene: Secondary | ICD-10-CM | POA: Diagnosis not present

## 2022-12-12 DIAGNOSIS — E08621 Diabetes mellitus due to underlying condition with foot ulcer: Secondary | ICD-10-CM | POA: Diagnosis not present

## 2022-12-12 DIAGNOSIS — I10 Essential (primary) hypertension: Secondary | ICD-10-CM | POA: Diagnosis not present

## 2022-12-12 DIAGNOSIS — Z79899 Other long term (current) drug therapy: Secondary | ICD-10-CM | POA: Diagnosis not present

## 2022-12-12 DIAGNOSIS — D509 Iron deficiency anemia, unspecified: Secondary | ICD-10-CM | POA: Diagnosis not present

## 2022-12-12 DIAGNOSIS — E0840 Diabetes mellitus due to underlying condition with diabetic neuropathy, unspecified: Secondary | ICD-10-CM | POA: Diagnosis not present

## 2022-12-12 DIAGNOSIS — E1165 Type 2 diabetes mellitus with hyperglycemia: Secondary | ICD-10-CM | POA: Diagnosis not present

## 2022-12-12 DIAGNOSIS — E11319 Type 2 diabetes mellitus with unspecified diabetic retinopathy without macular edema: Secondary | ICD-10-CM | POA: Diagnosis not present

## 2022-12-15 ENCOUNTER — Encounter: Payer: Self-pay | Admitting: Orthopedic Surgery

## 2022-12-15 ENCOUNTER — Other Ambulatory Visit (INDEPENDENT_AMBULATORY_CARE_PROVIDER_SITE_OTHER): Payer: Medicare Other

## 2022-12-15 ENCOUNTER — Ambulatory Visit (INDEPENDENT_AMBULATORY_CARE_PROVIDER_SITE_OTHER): Payer: Medicare Other | Admitting: Orthopedic Surgery

## 2022-12-15 VITALS — Ht 69.0 in | Wt 265.0 lb

## 2022-12-15 DIAGNOSIS — E1151 Type 2 diabetes mellitus with diabetic peripheral angiopathy without gangrene: Secondary | ICD-10-CM | POA: Diagnosis not present

## 2022-12-15 DIAGNOSIS — K219 Gastro-esophageal reflux disease without esophagitis: Secondary | ICD-10-CM | POA: Diagnosis not present

## 2022-12-15 DIAGNOSIS — E782 Mixed hyperlipidemia: Secondary | ICD-10-CM | POA: Diagnosis not present

## 2022-12-15 DIAGNOSIS — Z4781 Encounter for orthopedic aftercare following surgical amputation: Secondary | ICD-10-CM | POA: Diagnosis not present

## 2022-12-15 DIAGNOSIS — I69311 Memory deficit following cerebral infarction: Secondary | ICD-10-CM | POA: Diagnosis not present

## 2022-12-15 DIAGNOSIS — M179 Osteoarthritis of knee, unspecified: Secondary | ICD-10-CM | POA: Diagnosis not present

## 2022-12-15 DIAGNOSIS — N182 Chronic kidney disease, stage 2 (mild): Secondary | ICD-10-CM | POA: Diagnosis not present

## 2022-12-15 DIAGNOSIS — F32A Depression, unspecified: Secondary | ICD-10-CM | POA: Diagnosis not present

## 2022-12-15 DIAGNOSIS — I509 Heart failure, unspecified: Secondary | ICD-10-CM | POA: Diagnosis not present

## 2022-12-15 DIAGNOSIS — M25562 Pain in left knee: Secondary | ICD-10-CM

## 2022-12-15 DIAGNOSIS — G8929 Other chronic pain: Secondary | ICD-10-CM | POA: Diagnosis not present

## 2022-12-15 DIAGNOSIS — Z8673 Personal history of transient ischemic attack (TIA), and cerebral infarction without residual deficits: Secondary | ICD-10-CM | POA: Diagnosis not present

## 2022-12-15 DIAGNOSIS — D509 Iron deficiency anemia, unspecified: Secondary | ICD-10-CM | POA: Diagnosis not present

## 2022-12-15 DIAGNOSIS — M109 Gout, unspecified: Secondary | ICD-10-CM | POA: Diagnosis not present

## 2022-12-15 DIAGNOSIS — I10 Essential (primary) hypertension: Secondary | ICD-10-CM | POA: Diagnosis not present

## 2022-12-15 DIAGNOSIS — I4892 Unspecified atrial flutter: Secondary | ICD-10-CM | POA: Diagnosis not present

## 2022-12-15 DIAGNOSIS — Z8709 Personal history of other diseases of the respiratory system: Secondary | ICD-10-CM | POA: Diagnosis not present

## 2022-12-15 DIAGNOSIS — Z6838 Body mass index (BMI) 38.0-38.9, adult: Secondary | ICD-10-CM | POA: Diagnosis not present

## 2022-12-15 DIAGNOSIS — E11621 Type 2 diabetes mellitus with foot ulcer: Secondary | ICD-10-CM | POA: Diagnosis not present

## 2022-12-15 DIAGNOSIS — N400 Enlarged prostate without lower urinary tract symptoms: Secondary | ICD-10-CM | POA: Diagnosis not present

## 2022-12-15 DIAGNOSIS — I130 Hypertensive heart and chronic kidney disease with heart failure and stage 1 through stage 4 chronic kidney disease, or unspecified chronic kidney disease: Secondary | ICD-10-CM | POA: Diagnosis not present

## 2022-12-15 DIAGNOSIS — H9190 Unspecified hearing loss, unspecified ear: Secondary | ICD-10-CM | POA: Diagnosis not present

## 2022-12-15 DIAGNOSIS — Z89432 Acquired absence of left foot: Secondary | ICD-10-CM | POA: Diagnosis not present

## 2022-12-15 DIAGNOSIS — I5033 Acute on chronic diastolic (congestive) heart failure: Secondary | ICD-10-CM | POA: Diagnosis not present

## 2022-12-15 DIAGNOSIS — D631 Anemia in chronic kidney disease: Secondary | ICD-10-CM | POA: Diagnosis not present

## 2022-12-15 DIAGNOSIS — E1121 Type 2 diabetes mellitus with diabetic nephropathy: Secondary | ICD-10-CM | POA: Diagnosis not present

## 2022-12-15 DIAGNOSIS — M1990 Unspecified osteoarthritis, unspecified site: Secondary | ICD-10-CM | POA: Diagnosis not present

## 2022-12-15 DIAGNOSIS — E1122 Type 2 diabetes mellitus with diabetic chronic kidney disease: Secondary | ICD-10-CM | POA: Diagnosis not present

## 2022-12-15 DIAGNOSIS — M6702 Short Achilles tendon (acquired), left ankle: Secondary | ICD-10-CM | POA: Diagnosis not present

## 2022-12-15 DIAGNOSIS — I872 Venous insufficiency (chronic) (peripheral): Secondary | ICD-10-CM | POA: Diagnosis not present

## 2022-12-15 DIAGNOSIS — G4733 Obstructive sleep apnea (adult) (pediatric): Secondary | ICD-10-CM | POA: Diagnosis not present

## 2022-12-15 DIAGNOSIS — I4891 Unspecified atrial fibrillation: Secondary | ICD-10-CM | POA: Diagnosis not present

## 2022-12-15 DIAGNOSIS — L97429 Non-pressure chronic ulcer of left heel and midfoot with unspecified severity: Secondary | ICD-10-CM | POA: Diagnosis not present

## 2022-12-16 DIAGNOSIS — I872 Venous insufficiency (chronic) (peripheral): Secondary | ICD-10-CM | POA: Diagnosis not present

## 2022-12-16 DIAGNOSIS — L97429 Non-pressure chronic ulcer of left heel and midfoot with unspecified severity: Secondary | ICD-10-CM | POA: Diagnosis not present

## 2022-12-16 DIAGNOSIS — Z4781 Encounter for orthopedic aftercare following surgical amputation: Secondary | ICD-10-CM | POA: Diagnosis not present

## 2022-12-16 DIAGNOSIS — I130 Hypertensive heart and chronic kidney disease with heart failure and stage 1 through stage 4 chronic kidney disease, or unspecified chronic kidney disease: Secondary | ICD-10-CM | POA: Diagnosis not present

## 2022-12-16 DIAGNOSIS — E1151 Type 2 diabetes mellitus with diabetic peripheral angiopathy without gangrene: Secondary | ICD-10-CM | POA: Diagnosis not present

## 2022-12-16 DIAGNOSIS — E11621 Type 2 diabetes mellitus with foot ulcer: Secondary | ICD-10-CM | POA: Diagnosis not present

## 2022-12-23 DIAGNOSIS — E1151 Type 2 diabetes mellitus with diabetic peripheral angiopathy without gangrene: Secondary | ICD-10-CM | POA: Diagnosis not present

## 2022-12-23 DIAGNOSIS — E11621 Type 2 diabetes mellitus with foot ulcer: Secondary | ICD-10-CM | POA: Diagnosis not present

## 2022-12-23 DIAGNOSIS — I130 Hypertensive heart and chronic kidney disease with heart failure and stage 1 through stage 4 chronic kidney disease, or unspecified chronic kidney disease: Secondary | ICD-10-CM | POA: Diagnosis not present

## 2022-12-23 DIAGNOSIS — L97429 Non-pressure chronic ulcer of left heel and midfoot with unspecified severity: Secondary | ICD-10-CM | POA: Diagnosis not present

## 2022-12-23 DIAGNOSIS — Z4781 Encounter for orthopedic aftercare following surgical amputation: Secondary | ICD-10-CM | POA: Diagnosis not present

## 2022-12-23 DIAGNOSIS — I872 Venous insufficiency (chronic) (peripheral): Secondary | ICD-10-CM | POA: Diagnosis not present

## 2023-01-01 ENCOUNTER — Encounter: Payer: Self-pay | Admitting: Orthopedic Surgery

## 2023-01-01 DIAGNOSIS — G8929 Other chronic pain: Secondary | ICD-10-CM | POA: Diagnosis not present

## 2023-01-01 DIAGNOSIS — M25562 Pain in left knee: Secondary | ICD-10-CM

## 2023-01-01 MED ORDER — LIDOCAINE HCL (PF) 1 % IJ SOLN
5.0000 mL | INTRAMUSCULAR | Status: AC | PRN
  Administered 2023-01-01: 5 mL

## 2023-01-01 MED ORDER — METHYLPREDNISOLONE ACETATE 40 MG/ML IJ SUSP
40.0000 mg | INTRAMUSCULAR | Status: AC | PRN
  Administered 2023-01-01: 40 mg via INTRA_ARTICULAR

## 2023-01-01 NOTE — Progress Notes (Signed)
Office Visit Note   Patient: Oscar [name].           Date of Birth: 07-12-1946           MRN: [id number] Visit Date: 12/15/2022              Requested by: Emilio Aspen, MD [address], [address] [place],  [place] [address] PCP: Emilio Aspen, MD  Chief Complaint  Patient presents with   Left Foot - Follow-up    07/23/2022 left transmet amputation         HPI: Patient is a 77 year old gentleman who is 5 months status post left transmetatarsal amputation.  Patient states he is doing well was released by home health nursing last week.  Patient is still working with home health therapy.  Patient complains of increasing arthritic pain in the left knee.  Assessment & Plan: Visit Diagnoses:  1. Chronic pain of left knee   2. History of transmetatarsal amputation of left foot (HCC)     Plan: Left knee was injected.  Patient will continue his therapy for strengthening.  Follow-Up Instructions: Return if symptoms worsen or fail to improve.   Ortho Exam  Patient is alert, oriented, no adenopathy, well-dressed, normal affect, normal respiratory effort. Examination of the left knee patient has tenderness to palpation of the medial lateral joint line as well as patellofemoral joint.  Collaterals and cruciates are stable there is a mild effusion.  There is crepitation with range of motion.  The heel ulcer on the left has healed transmetatarsal on the left has healed.  Imaging: No results found. No images are attached to the encounter.  Labs: Lab Results  Component Value Date   HGBA1C 7.5 (H) 07/23/2022   HGBA1C 10.1 (H) 09/08/2016   HGBA1C 9.1 (H) 08/28/2014   ESRSEDRATE 75 (H) 08/28/2014   ESRSEDRATE 42 (H) 11/17/2012   CRP 12.9 (H) 08/28/2014   LABURIC 3.0 (L) 08/31/2014   REPTSTATUS 10/03/2017 FINAL 09/30/2017   GRAMSTAIN  09/30/2017    RARE WBC PRESENT, PREDOMINANTLY MONONUCLEAR NO ORGANISMS SEEN    CULT  09/30/2017    NO GROWTH 2  DAYS Performed at [facility], [address] [place], [place] [address]      Lab Results  Component Value Date   ALBUMIN 2.4 (L) 07/30/2022   ALBUMIN 3.3 (L) 09/07/2016   ALBUMIN 3.6 09/15/2014    No results found for: "MG" No results found for: "VD25OH"  No results found for: "PREALBUMIN"    Latest Ref Rng & Units 07/30/2022    5:19 AM 07/23/2022    8:35 AM 12/26/2021    7:51 AM  CBC EXTENDED  WBC 4.0 - 10.5 K/uL 8.3  7.3  7.5   RBC 4.22 - 5.81 MIL/uL 3.77  4.22  4.07   Hemoglobin 13.0 - 17.0 g/dL 62.1  30.8  65.7   HCT 39.0 - 52.0 % 36.6  40.0  39.7   Platelets 150 - 400 K/uL 278  204  195   NEUT# 1.7 - 7.7 K/uL 5.9     Lymph# 0.7 - 4.0 K/uL 0.8        Body mass index is 39.13 kg/m.  Orders:  Orders Placed This Encounter  Procedures   XR Knee 1-2 Views Left   No orders of the defined types were placed in this encounter.    Procedures: Large Joint Inj: L knee on 01/01/2023 7:50 AM Indications: pain and diagnostic evaluation  Details: 22 G 1.5 in needle, anteromedial approach  Arthrogram: No  Medications: 5 mL lidocaine (PF) 1 %; 40 mg methylPREDNISolone acetate 40 MG/ML Outcome: tolerated well, no immediate complications Procedure, treatment alternatives, risks and benefits explained, specific risks discussed. Consent was given by the patient. Immediately prior to procedure a time out was called to verify the correct patient, procedure, equipment, support staff and site/side marked as required. Patient was prepped and draped in the usual sterile fashion.      Clinical Data: No additional findings.  ROS:  All other systems negative, except as noted in the HPI. Review of Systems  Objective: Vital Signs: Ht 5\' 9"  (1.753 m)   Wt 265 lb (120.2 kg)   BMI 39.13 kg/m   Specialty Comments:  No specialty comments available.  PMFS History: Patient Active Problem List   Diagnosis Date Noted   Osteomyelitis of left lower extremity (HCC)  07/29/2022   History of transmetatarsal amputation of left foot (HCC) 07/23/2022   Pain in left foot 04/21/2018   PVD (peripheral vascular disease) (HCC) 04/21/2018   Amputated toe, left (HCC) 01/21/2018   Achilles tendon contracture, left 12/28/2017   Dehiscence of amputation stump (HCC) 11/10/2017   Great toe amputation status, left 11/10/2017   Memory change 04/14/2017   Cutaneous abscess of left foot 04/09/2017   Non-pressure chronic ulcer of right heel and midfoot limited to breakdown of skin (HCC) 10/07/2016   Influenza A    AKI (acute kidney injury) (HCC) 09/07/2016   Acute encephalopathy 09/07/2016   Acute respiratory failure with hypoxia (HCC) 09/07/2016   Elevated lactic acid level 09/07/2016   Elevated troponin 09/07/2016   Coronary artery calcification seen on CT scan 10/28/2015   Acute on chronic diastolic (congestive) heart failure (HCC) 09/26/2015   OSA (obstructive sleep apnea) 02/07/2015   Sepsis (HCC) 08/28/2014   Chronic diastolic heart failure (HCC) 09/21/2013   CVA (cerebral infarction) 08/08/2013   Dizziness 08/07/2013   TIA (transient ischemic attack) 08/07/2013   Atrial fibrillation (HCC) 08/07/2013   Hypertension 08/07/2013   Anemia 08/07/2013   Dyspnea 06/21/2013   Chest pain 11/16/2012   Atrial fibrillation with RVR (HCC) 11/16/2012   Gout 11/16/2012   Diabetes mellitus type 2, uncontrolled, with complications (HCC) 11/16/2012   CKD (chronic kidney disease), stage II 11/16/2012   Atypical atrial flutter (HCC) 11/16/2012   Pleuritic chest pain 11/16/2012   Morbid obesity (HCC) 11/16/2012   Obstructive sleep apnea 11/16/2012   Past Medical History:  Diagnosis Date   Anal fissure    h/o - no recent complications   Arthritis    hips   Atrial fibrillation (HCC)    CKD (chronic kidney disease)    Depression    Diabetes mellitus without complication (HCC)    Type II   Enlarged prostate    GERD (gastroesophageal reflux disease)    Gout    H/O  hiatal hernia    Hard of hearing    History of kidney stones    Hx of typhoid fever    as a child   Hypertension    improved after diet/exercise   Memory change 04/14/2017   OSA (obstructive sleep apnea)    wears cpap   Pneumonia    PONV (postoperative nausea and vomiting)    also difficulty waking up   Stroke (HCC) 08/2013   short term memory loss (slight)    Family History  Problem Relation Age of Onset   COPD Father    Heart attack  Sister        died age 74 of MI    Past Surgical History:  Procedure Laterality Date   AMPUTATION Left 10/02/2017   Procedure: LEFT FOOT 1ST RAY AMPUTATION;  Surgeon: Nadara Mustard, MD;  Location: [facility] OR;  Service: Orthopedics;  Laterality: Left;   AMPUTATION Left 01/15/2018   Procedure: LEFT FOOT 2ND TOE AMPUTATION;  Surgeon: Nadara Mustard, MD;  Location: [facility] OR;  Service: Orthopedics;  Laterality: Left;   AMPUTATION Left 07/23/2022   Procedure: LEFT TRANSMETATARSAL AMPUTATION;  Surgeon: Nadara Mustard, MD;  Location: [facility] OR;  Service: Orthopedics;  Laterality: Left;   CARPAL TUNNEL RELEASE Left 12/26/2021   Procedure: CARPAL TUNNEL RELEASE;  Surgeon: Betha Loa, MD;  Location: [facility] OR;  Service: Orthopedics;  Laterality: Left;   carpel tunnel Bilateral    CATARACT EXTRACTION W/ INTRAOCULAR LENS  IMPLANT, BILATERAL     CHOLECYSTECTOMY     COLONOSCOPY     EYE SURGERY     laser surgery    I & D EXTREMITY Left 09/15/2014   Procedure: Excision Necrotic Left Achilles, Skin Graft, apply Wound VAC;  Surgeon: Nadara Mustard, MD;  Location: [facility] OR;  Service: Orthopedics;  Laterality: Left;   KIDNEY STONE SURGERY     KNEE ARTHROSCOPY Bilateral    LIGAMENT REPAIR     TONSILLECTOMY     VASECTOMY     Social History   Occupational History   Occupation: Retired  Tobacco Use   Smoking status: Never   Smokeless tobacco: Never  Vaping Use   Vaping Use: Never used  Substance and Sexual Activity   Alcohol use: Yes    Alcohol/week: 0.0 standard drinks of  alcohol    Comment: rare - 1-2x /yr   Drug use: No   Sexual activity: Not on file

## 2023-01-02 DIAGNOSIS — I130 Hypertensive heart and chronic kidney disease with heart failure and stage 1 through stage 4 chronic kidney disease, or unspecified chronic kidney disease: Secondary | ICD-10-CM | POA: Diagnosis not present

## 2023-01-02 DIAGNOSIS — E11621 Type 2 diabetes mellitus with foot ulcer: Secondary | ICD-10-CM | POA: Diagnosis not present

## 2023-01-02 DIAGNOSIS — L97429 Non-pressure chronic ulcer of left heel and midfoot with unspecified severity: Secondary | ICD-10-CM | POA: Diagnosis not present

## 2023-01-02 DIAGNOSIS — Z4781 Encounter for orthopedic aftercare following surgical amputation: Secondary | ICD-10-CM | POA: Diagnosis not present

## 2023-01-02 DIAGNOSIS — E1151 Type 2 diabetes mellitus with diabetic peripheral angiopathy without gangrene: Secondary | ICD-10-CM | POA: Diagnosis not present

## 2023-01-02 DIAGNOSIS — I872 Venous insufficiency (chronic) (peripheral): Secondary | ICD-10-CM | POA: Diagnosis not present

## 2023-01-20 DIAGNOSIS — E113599 Type 2 diabetes mellitus with proliferative diabetic retinopathy without macular edema, unspecified eye: Secondary | ICD-10-CM | POA: Diagnosis not present

## 2023-01-20 DIAGNOSIS — E1151 Type 2 diabetes mellitus with diabetic peripheral angiopathy without gangrene: Secondary | ICD-10-CM | POA: Diagnosis not present

## 2023-01-20 DIAGNOSIS — E0840 Diabetes mellitus due to underlying condition with diabetic neuropathy, unspecified: Secondary | ICD-10-CM | POA: Diagnosis not present

## 2023-01-20 DIAGNOSIS — E11319 Type 2 diabetes mellitus with unspecified diabetic retinopathy without macular edema: Secondary | ICD-10-CM | POA: Diagnosis not present

## 2023-01-20 DIAGNOSIS — I10 Essential (primary) hypertension: Secondary | ICD-10-CM | POA: Diagnosis not present

## 2023-01-20 DIAGNOSIS — F322 Major depressive disorder, single episode, severe without psychotic features: Secondary | ICD-10-CM | POA: Diagnosis not present

## 2023-01-20 DIAGNOSIS — Z1331 Encounter for screening for depression: Secondary | ICD-10-CM | POA: Diagnosis not present

## 2023-01-20 DIAGNOSIS — E1121 Type 2 diabetes mellitus with diabetic nephropathy: Secondary | ICD-10-CM | POA: Diagnosis not present

## 2023-01-20 DIAGNOSIS — I4891 Unspecified atrial fibrillation: Secondary | ICD-10-CM | POA: Diagnosis not present

## 2023-01-20 DIAGNOSIS — Z0000 Encounter for general adult medical examination without abnormal findings: Secondary | ICD-10-CM | POA: Diagnosis not present

## 2023-01-20 DIAGNOSIS — I509 Heart failure, unspecified: Secondary | ICD-10-CM | POA: Diagnosis not present

## 2023-01-20 DIAGNOSIS — E1165 Type 2 diabetes mellitus with hyperglycemia: Secondary | ICD-10-CM | POA: Diagnosis not present

## 2023-01-20 DIAGNOSIS — Z89432 Acquired absence of left foot: Secondary | ICD-10-CM | POA: Diagnosis not present

## 2023-02-05 ENCOUNTER — Other Ambulatory Visit (HOSPITAL_COMMUNITY): Payer: Self-pay | Admitting: Internal Medicine

## 2023-06-17 DIAGNOSIS — H3530 Unspecified macular degeneration: Secondary | ICD-10-CM | POA: Diagnosis not present

## 2023-06-17 DIAGNOSIS — R635 Abnormal weight gain: Secondary | ICD-10-CM | POA: Diagnosis not present

## 2023-06-17 DIAGNOSIS — M7989 Other specified soft tissue disorders: Secondary | ICD-10-CM | POA: Diagnosis not present

## 2023-06-17 DIAGNOSIS — R2689 Other abnormalities of gait and mobility: Secondary | ICD-10-CM | POA: Diagnosis not present

## 2023-06-17 DIAGNOSIS — Z89432 Acquired absence of left foot: Secondary | ICD-10-CM | POA: Diagnosis not present

## 2023-06-24 DIAGNOSIS — N400 Enlarged prostate without lower urinary tract symptoms: Secondary | ICD-10-CM | POA: Diagnosis not present

## 2023-06-24 DIAGNOSIS — E113599 Type 2 diabetes mellitus with proliferative diabetic retinopathy without macular edema, unspecified eye: Secondary | ICD-10-CM | POA: Diagnosis not present

## 2023-06-24 DIAGNOSIS — Z87442 Personal history of urinary calculi: Secondary | ICD-10-CM | POA: Diagnosis not present

## 2023-06-24 DIAGNOSIS — Z7901 Long term (current) use of anticoagulants: Secondary | ICD-10-CM | POA: Diagnosis not present

## 2023-06-24 DIAGNOSIS — Z7984 Long term (current) use of oral hypoglycemic drugs: Secondary | ICD-10-CM | POA: Diagnosis not present

## 2023-06-24 DIAGNOSIS — I110 Hypertensive heart disease with heart failure: Secondary | ICD-10-CM | POA: Diagnosis not present

## 2023-06-24 DIAGNOSIS — E1140 Type 2 diabetes mellitus with diabetic neuropathy, unspecified: Secondary | ICD-10-CM | POA: Diagnosis not present

## 2023-06-24 DIAGNOSIS — Z6838 Body mass index (BMI) 38.0-38.9, adult: Secondary | ICD-10-CM | POA: Diagnosis not present

## 2023-06-24 DIAGNOSIS — E7800 Pure hypercholesterolemia, unspecified: Secondary | ICD-10-CM | POA: Diagnosis not present

## 2023-06-24 DIAGNOSIS — Z794 Long term (current) use of insulin: Secondary | ICD-10-CM | POA: Diagnosis not present

## 2023-06-24 DIAGNOSIS — F329 Major depressive disorder, single episode, unspecified: Secondary | ICD-10-CM | POA: Diagnosis not present

## 2023-06-24 DIAGNOSIS — I4891 Unspecified atrial fibrillation: Secondary | ICD-10-CM | POA: Diagnosis not present

## 2023-06-24 DIAGNOSIS — H3530 Unspecified macular degeneration: Secondary | ICD-10-CM | POA: Diagnosis not present

## 2023-06-24 DIAGNOSIS — Z87440 Personal history of urinary (tract) infections: Secondary | ICD-10-CM | POA: Diagnosis not present

## 2023-06-24 DIAGNOSIS — I509 Heart failure, unspecified: Secondary | ICD-10-CM | POA: Diagnosis not present

## 2023-06-24 DIAGNOSIS — G4730 Sleep apnea, unspecified: Secondary | ICD-10-CM | POA: Diagnosis not present

## 2023-06-24 DIAGNOSIS — M109 Gout, unspecified: Secondary | ICD-10-CM | POA: Diagnosis not present

## 2023-06-29 DIAGNOSIS — M109 Gout, unspecified: Secondary | ICD-10-CM | POA: Diagnosis not present

## 2023-06-29 DIAGNOSIS — E113599 Type 2 diabetes mellitus with proliferative diabetic retinopathy without macular edema, unspecified eye: Secondary | ICD-10-CM | POA: Diagnosis not present

## 2023-06-29 DIAGNOSIS — E7800 Pure hypercholesterolemia, unspecified: Secondary | ICD-10-CM | POA: Diagnosis not present

## 2023-06-29 DIAGNOSIS — I4891 Unspecified atrial fibrillation: Secondary | ICD-10-CM | POA: Diagnosis not present

## 2023-06-29 DIAGNOSIS — G4730 Sleep apnea, unspecified: Secondary | ICD-10-CM | POA: Diagnosis not present

## 2023-06-29 DIAGNOSIS — E1140 Type 2 diabetes mellitus with diabetic neuropathy, unspecified: Secondary | ICD-10-CM | POA: Diagnosis not present

## 2023-07-01 DIAGNOSIS — I4891 Unspecified atrial fibrillation: Secondary | ICD-10-CM | POA: Diagnosis not present

## 2023-07-01 DIAGNOSIS — E1140 Type 2 diabetes mellitus with diabetic neuropathy, unspecified: Secondary | ICD-10-CM | POA: Diagnosis not present

## 2023-07-01 DIAGNOSIS — M109 Gout, unspecified: Secondary | ICD-10-CM | POA: Diagnosis not present

## 2023-07-01 DIAGNOSIS — E113599 Type 2 diabetes mellitus with proliferative diabetic retinopathy without macular edema, unspecified eye: Secondary | ICD-10-CM | POA: Diagnosis not present

## 2023-07-01 DIAGNOSIS — E7800 Pure hypercholesterolemia, unspecified: Secondary | ICD-10-CM | POA: Diagnosis not present

## 2023-07-01 DIAGNOSIS — G4730 Sleep apnea, unspecified: Secondary | ICD-10-CM | POA: Diagnosis not present

## 2023-07-07 DIAGNOSIS — I4891 Unspecified atrial fibrillation: Secondary | ICD-10-CM | POA: Diagnosis not present

## 2023-07-07 DIAGNOSIS — M109 Gout, unspecified: Secondary | ICD-10-CM | POA: Diagnosis not present

## 2023-07-07 DIAGNOSIS — G4730 Sleep apnea, unspecified: Secondary | ICD-10-CM | POA: Diagnosis not present

## 2023-07-07 DIAGNOSIS — E113599 Type 2 diabetes mellitus with proliferative diabetic retinopathy without macular edema, unspecified eye: Secondary | ICD-10-CM | POA: Diagnosis not present

## 2023-07-07 DIAGNOSIS — E7800 Pure hypercholesterolemia, unspecified: Secondary | ICD-10-CM | POA: Diagnosis not present

## 2023-07-07 DIAGNOSIS — E1140 Type 2 diabetes mellitus with diabetic neuropathy, unspecified: Secondary | ICD-10-CM | POA: Diagnosis not present

## 2023-07-16 DIAGNOSIS — I4891 Unspecified atrial fibrillation: Secondary | ICD-10-CM | POA: Diagnosis not present

## 2023-07-16 DIAGNOSIS — G4730 Sleep apnea, unspecified: Secondary | ICD-10-CM | POA: Diagnosis not present

## 2023-07-16 DIAGNOSIS — M109 Gout, unspecified: Secondary | ICD-10-CM | POA: Diagnosis not present

## 2023-07-16 DIAGNOSIS — E7800 Pure hypercholesterolemia, unspecified: Secondary | ICD-10-CM | POA: Diagnosis not present

## 2023-07-16 DIAGNOSIS — E1140 Type 2 diabetes mellitus with diabetic neuropathy, unspecified: Secondary | ICD-10-CM | POA: Diagnosis not present

## 2023-07-16 DIAGNOSIS — E113599 Type 2 diabetes mellitus with proliferative diabetic retinopathy without macular edema, unspecified eye: Secondary | ICD-10-CM | POA: Diagnosis not present

## 2023-07-21 DIAGNOSIS — E113593 Type 2 diabetes mellitus with proliferative diabetic retinopathy without macular edema, bilateral: Secondary | ICD-10-CM | POA: Diagnosis not present

## 2023-07-21 DIAGNOSIS — E1140 Type 2 diabetes mellitus with diabetic neuropathy, unspecified: Secondary | ICD-10-CM | POA: Diagnosis not present

## 2023-07-21 DIAGNOSIS — E7800 Pure hypercholesterolemia, unspecified: Secondary | ICD-10-CM | POA: Diagnosis not present

## 2023-07-21 DIAGNOSIS — E113599 Type 2 diabetes mellitus with proliferative diabetic retinopathy without macular edema, unspecified eye: Secondary | ICD-10-CM | POA: Diagnosis not present

## 2023-07-21 DIAGNOSIS — Z89432 Acquired absence of left foot: Secondary | ICD-10-CM | POA: Diagnosis not present

## 2023-07-21 DIAGNOSIS — H3530 Unspecified macular degeneration: Secondary | ICD-10-CM | POA: Diagnosis not present

## 2023-07-21 DIAGNOSIS — G4730 Sleep apnea, unspecified: Secondary | ICD-10-CM | POA: Diagnosis not present

## 2023-07-21 DIAGNOSIS — M109 Gout, unspecified: Secondary | ICD-10-CM | POA: Diagnosis not present

## 2023-07-21 DIAGNOSIS — W19XXXA Unspecified fall, initial encounter: Secondary | ICD-10-CM | POA: Diagnosis not present

## 2023-07-21 DIAGNOSIS — R2689 Other abnormalities of gait and mobility: Secondary | ICD-10-CM | POA: Diagnosis not present

## 2023-07-21 DIAGNOSIS — I10 Essential (primary) hypertension: Secondary | ICD-10-CM | POA: Diagnosis not present

## 2023-07-21 DIAGNOSIS — I509 Heart failure, unspecified: Secondary | ICD-10-CM | POA: Diagnosis not present

## 2023-07-21 DIAGNOSIS — I4891 Unspecified atrial fibrillation: Secondary | ICD-10-CM | POA: Diagnosis not present

## 2023-07-21 DIAGNOSIS — G4733 Obstructive sleep apnea (adult) (pediatric): Secondary | ICD-10-CM | POA: Diagnosis not present

## 2023-07-24 DIAGNOSIS — I4891 Unspecified atrial fibrillation: Secondary | ICD-10-CM | POA: Diagnosis not present

## 2023-07-24 DIAGNOSIS — I509 Heart failure, unspecified: Secondary | ICD-10-CM | POA: Diagnosis not present

## 2023-07-24 DIAGNOSIS — Z87440 Personal history of urinary (tract) infections: Secondary | ICD-10-CM | POA: Diagnosis not present

## 2023-07-24 DIAGNOSIS — I110 Hypertensive heart disease with heart failure: Secondary | ICD-10-CM | POA: Diagnosis not present

## 2023-07-24 DIAGNOSIS — M109 Gout, unspecified: Secondary | ICD-10-CM | POA: Diagnosis not present

## 2023-07-24 DIAGNOSIS — Z794 Long term (current) use of insulin: Secondary | ICD-10-CM | POA: Diagnosis not present

## 2023-07-24 DIAGNOSIS — Z7984 Long term (current) use of oral hypoglycemic drugs: Secondary | ICD-10-CM | POA: Diagnosis not present

## 2023-07-24 DIAGNOSIS — Z6838 Body mass index (BMI) 38.0-38.9, adult: Secondary | ICD-10-CM | POA: Diagnosis not present

## 2023-07-24 DIAGNOSIS — E113599 Type 2 diabetes mellitus with proliferative diabetic retinopathy without macular edema, unspecified eye: Secondary | ICD-10-CM | POA: Diagnosis not present

## 2023-07-24 DIAGNOSIS — F329 Major depressive disorder, single episode, unspecified: Secondary | ICD-10-CM | POA: Diagnosis not present

## 2023-07-24 DIAGNOSIS — Z7901 Long term (current) use of anticoagulants: Secondary | ICD-10-CM | POA: Diagnosis not present

## 2023-07-24 DIAGNOSIS — E1140 Type 2 diabetes mellitus with diabetic neuropathy, unspecified: Secondary | ICD-10-CM | POA: Diagnosis not present

## 2023-07-24 DIAGNOSIS — H3530 Unspecified macular degeneration: Secondary | ICD-10-CM | POA: Diagnosis not present

## 2023-07-24 DIAGNOSIS — Z87442 Personal history of urinary calculi: Secondary | ICD-10-CM | POA: Diagnosis not present

## 2023-07-24 DIAGNOSIS — G4730 Sleep apnea, unspecified: Secondary | ICD-10-CM | POA: Diagnosis not present

## 2023-07-24 DIAGNOSIS — E7800 Pure hypercholesterolemia, unspecified: Secondary | ICD-10-CM | POA: Diagnosis not present

## 2023-07-24 DIAGNOSIS — N400 Enlarged prostate without lower urinary tract symptoms: Secondary | ICD-10-CM | POA: Diagnosis not present

## 2023-07-27 DIAGNOSIS — G4730 Sleep apnea, unspecified: Secondary | ICD-10-CM | POA: Diagnosis not present

## 2023-07-27 DIAGNOSIS — I4891 Unspecified atrial fibrillation: Secondary | ICD-10-CM | POA: Diagnosis not present

## 2023-07-27 DIAGNOSIS — M109 Gout, unspecified: Secondary | ICD-10-CM | POA: Diagnosis not present

## 2023-07-27 DIAGNOSIS — E1140 Type 2 diabetes mellitus with diabetic neuropathy, unspecified: Secondary | ICD-10-CM | POA: Diagnosis not present

## 2023-07-27 DIAGNOSIS — E7800 Pure hypercholesterolemia, unspecified: Secondary | ICD-10-CM | POA: Diagnosis not present

## 2023-07-27 DIAGNOSIS — E113599 Type 2 diabetes mellitus with proliferative diabetic retinopathy without macular edema, unspecified eye: Secondary | ICD-10-CM | POA: Diagnosis not present

## 2023-08-03 DIAGNOSIS — E113599 Type 2 diabetes mellitus with proliferative diabetic retinopathy without macular edema, unspecified eye: Secondary | ICD-10-CM | POA: Diagnosis not present

## 2023-08-03 DIAGNOSIS — G4730 Sleep apnea, unspecified: Secondary | ICD-10-CM | POA: Diagnosis not present

## 2023-08-03 DIAGNOSIS — I4891 Unspecified atrial fibrillation: Secondary | ICD-10-CM | POA: Diagnosis not present

## 2023-08-03 DIAGNOSIS — M109 Gout, unspecified: Secondary | ICD-10-CM | POA: Diagnosis not present

## 2023-08-03 DIAGNOSIS — E7800 Pure hypercholesterolemia, unspecified: Secondary | ICD-10-CM | POA: Diagnosis not present

## 2023-08-03 DIAGNOSIS — E1140 Type 2 diabetes mellitus with diabetic neuropathy, unspecified: Secondary | ICD-10-CM | POA: Diagnosis not present

## 2023-08-10 DIAGNOSIS — I4891 Unspecified atrial fibrillation: Secondary | ICD-10-CM | POA: Diagnosis not present

## 2023-08-10 DIAGNOSIS — E113599 Type 2 diabetes mellitus with proliferative diabetic retinopathy without macular edema, unspecified eye: Secondary | ICD-10-CM | POA: Diagnosis not present

## 2023-08-10 DIAGNOSIS — M109 Gout, unspecified: Secondary | ICD-10-CM | POA: Diagnosis not present

## 2023-08-10 DIAGNOSIS — E1140 Type 2 diabetes mellitus with diabetic neuropathy, unspecified: Secondary | ICD-10-CM | POA: Diagnosis not present

## 2023-08-10 DIAGNOSIS — E7800 Pure hypercholesterolemia, unspecified: Secondary | ICD-10-CM | POA: Diagnosis not present

## 2023-08-10 DIAGNOSIS — G4730 Sleep apnea, unspecified: Secondary | ICD-10-CM | POA: Diagnosis not present

## 2023-09-22 DIAGNOSIS — I4891 Unspecified atrial fibrillation: Secondary | ICD-10-CM | POA: Diagnosis not present

## 2023-09-22 DIAGNOSIS — E1159 Type 2 diabetes mellitus with other circulatory complications: Secondary | ICD-10-CM | POA: Diagnosis not present

## 2023-09-22 DIAGNOSIS — R2689 Other abnormalities of gait and mobility: Secondary | ICD-10-CM | POA: Diagnosis not present

## 2023-09-22 DIAGNOSIS — E113593 Type 2 diabetes mellitus with proliferative diabetic retinopathy without macular edema, bilateral: Secondary | ICD-10-CM | POA: Diagnosis not present

## 2023-09-22 DIAGNOSIS — I10 Essential (primary) hypertension: Secondary | ICD-10-CM | POA: Diagnosis not present

## 2023-09-22 DIAGNOSIS — H3530 Unspecified macular degeneration: Secondary | ICD-10-CM | POA: Diagnosis not present

## 2023-09-22 DIAGNOSIS — G4733 Obstructive sleep apnea (adult) (pediatric): Secondary | ICD-10-CM | POA: Diagnosis not present

## 2023-09-22 DIAGNOSIS — I509 Heart failure, unspecified: Secondary | ICD-10-CM | POA: Diagnosis not present

## 2023-09-22 DIAGNOSIS — Z794 Long term (current) use of insulin: Secondary | ICD-10-CM | POA: Diagnosis not present

## 2023-09-22 DIAGNOSIS — Z89432 Acquired absence of left foot: Secondary | ICD-10-CM | POA: Diagnosis not present

## 2023-10-06 ENCOUNTER — Ambulatory Visit (INDEPENDENT_AMBULATORY_CARE_PROVIDER_SITE_OTHER): Payer: Medicare Other | Admitting: Podiatry

## 2023-10-06 ENCOUNTER — Telehealth: Payer: Self-pay | Admitting: Podiatry

## 2023-10-06 ENCOUNTER — Encounter: Payer: Self-pay | Admitting: Podiatry

## 2023-10-06 DIAGNOSIS — Z89432 Acquired absence of left foot: Secondary | ICD-10-CM

## 2023-10-06 DIAGNOSIS — E11628 Type 2 diabetes mellitus with other skin complications: Secondary | ICD-10-CM

## 2023-10-06 DIAGNOSIS — R234 Changes in skin texture: Secondary | ICD-10-CM

## 2023-10-06 DIAGNOSIS — R269 Unspecified abnormalities of gait and mobility: Secondary | ICD-10-CM | POA: Diagnosis not present

## 2023-10-06 MED ORDER — GENTAMICIN SULFATE 0.1 % EX OINT: 1.0000 | TOPICAL_OINTMENT | Freq: Every day | CUTANEOUS | 0 refills | Status: DC

## 2023-10-06 NOTE — Progress Notes (Unsigned)
 Chief Complaint  Patient presents with   Foot Pain    new pt-hx of L foot transmetatarsal amputation, eval & recommendation for orthotic and/or custom shoe*diabetic*   HPI: 78 y.o. male presents today to request diabetic shoes and diabetic insoles.  Patient currently has a diabetic insole with a filler for his left transmetatarsal amputation, but notes that since he had the transmetatarsal amputation by Dr. Lajoyce Corners, he has been walking on the outside of his left foot.  He states that he has been very unstable and has concern of falls.  He is hoping there is a shoe or insert that will help straighten his foot and help with steadiness.  Past Medical History:  Diagnosis Date   Anal fissure    h/o - no recent complications   Arthritis    hips   Atrial fibrillation (HCC)    CKD (chronic kidney disease)    Depression    Diabetes mellitus without complication (HCC)    Type II   Enlarged prostate    GERD (gastroesophageal reflux disease)    Gout    H/O hiatal hernia    Hard of hearing    History of kidney stones    Hx of typhoid fever    as a child   Hypertension    improved after diet/exercise   Memory change 04/14/2017   OSA (obstructive sleep apnea)    wears cpap   Pneumonia    PONV (postoperative nausea and vomiting)    also difficulty waking up   Stroke (HCC) 08/2013   short term memory loss (slight)    Past Surgical History:  Procedure Laterality Date   AMPUTATION Left 10/02/2017   Procedure: LEFT FOOT 1ST RAY AMPUTATION;  Surgeon: Nadara Mustard, MD;  Location: [facility] OR;  Service: Orthopedics;  Laterality: Left;   AMPUTATION Left 01/15/2018   Procedure: LEFT FOOT 2ND TOE AMPUTATION;  Surgeon: Nadara Mustard, MD;  Location: [facility] OR;  Service: Orthopedics;  Laterality: Left;   AMPUTATION Left 07/23/2022   Procedure: LEFT TRANSMETATARSAL AMPUTATION;  Surgeon: Nadara Mustard, MD;  Location: [facility] OR;  Service: Orthopedics;  Laterality: Left;   CARPAL TUNNEL RELEASE Left 12/26/2021    Procedure: CARPAL TUNNEL RELEASE;  Surgeon: Betha Loa, MD;  Location: [facility] OR;  Service: Orthopedics;  Laterality: Left;   carpel tunnel Bilateral    CATARACT EXTRACTION W/ INTRAOCULAR LENS  IMPLANT, BILATERAL     CHOLECYSTECTOMY     COLONOSCOPY     EYE SURGERY     laser surgery    I & D EXTREMITY Left 09/15/2014   Procedure: Excision Necrotic Left Achilles, Skin Graft, apply Wound VAC;  Surgeon: Nadara Mustard, MD;  Location: [facility] OR;  Service: Orthopedics;  Laterality: Left;   KIDNEY STONE SURGERY     KNEE ARTHROSCOPY Bilateral    LIGAMENT REPAIR     TONSILLECTOMY     VASECTOMY      Allergies  Allergen Reactions   Iohexol Hives and Other (See Comments)     Desc: HIVES S/P 13HR.PREMEDS, ?LOW DOSAGE PREMEDS    Ivp Dye [Iodinated Contrast Media] Hives and Other (See Comments)    welps    Sulfamethoxazole-Trimethoprim Other (See Comments)    Gi upset    Physical Exam: Trace palpable pedal pulses noted.  There is a superficial fissure of the skin on the anterior aspect of the left TMA stump.  There is some dried blood present.  There are no clinical signs of  infection noted.  And resting calcaneal stance position the foot is in a varus position and patient ambulates along the lateral column.  Manual muscle testing 5/5.  No foot drop noted  Assessment/Plan of Care: 1. Type 2 diabetes mellitus with other skin complication, unspecified whether long term insulin use (HCC)   2. History of transmetatarsal amputation of left foot (HCC)   3. Abnormality of gait   4. Fissure of skin      Meds ordered this encounter  Medications   DISCONTD: gentamicin ointment (GARAMYCIN) 0.1 %    Sig: Apply 1 Application topically daily. Apply to wound daily    Dispense:  30 g    Refill:  0   FOR HOME USE ONLY DME DIABETIC SHOE  Discussed clinical findings with patient today.  Agree that patient would be in need of diabetic shoes, but he may need a diabetic hightop shoe/boot to help hold the ankle and  hindfoot steady especially on the left.  He will need a filler on the left foot and the standard diabetic custom insoles on the right foot.  Order was placed today and we will have our pedorthist evaluate him.  She may inform him that he needs an AFO on the left.  But, we will wait for her evaluation to see if she has some shoes/boots and inserts that could stabilize the left foot and ankle better.  Prescription for gentamicin sent into his pharmacy to apply to the fissure on the anterior aspect of the left TMA.  He may discontinue this once it has healed.  His family member confirmed his pharmacy and the medication was sent in.  Recommend follow-up every 3 to 6 months for diabetic foot check   Reina Wilton DBurna Mortimer, DPM, FACFAS [facility]     [address] [place], [place] [address]                Office [phone]  Fax [phone]

## 2023-10-06 NOTE — Telephone Encounter (Signed)
 Patients preferred pharmacy is [facility] on [address]

## 2023-10-07 MED ORDER — GENTAMICIN SULFATE 0.1 % EX OINT: 1.0000 | TOPICAL_OINTMENT | Freq: Every day | CUTANEOUS | 0 refills | Status: AC

## 2023-11-04 DIAGNOSIS — E11621 Type 2 diabetes mellitus with foot ulcer: Secondary | ICD-10-CM | POA: Diagnosis not present

## 2023-11-04 DIAGNOSIS — I5033 Acute on chronic diastolic (congestive) heart failure: Secondary | ICD-10-CM | POA: Diagnosis not present

## 2023-11-04 DIAGNOSIS — E0840 Diabetes mellitus due to underlying condition with diabetic neuropathy, unspecified: Secondary | ICD-10-CM | POA: Diagnosis not present

## 2023-11-04 DIAGNOSIS — I10 Essential (primary) hypertension: Secondary | ICD-10-CM | POA: Diagnosis not present

## 2023-11-11 ENCOUNTER — Ambulatory Visit: Payer: Medicare Other

## 2023-11-11 DIAGNOSIS — E11628 Type 2 diabetes mellitus with other skin complications: Secondary | ICD-10-CM

## 2023-11-11 DIAGNOSIS — Z89432 Acquired absence of left foot: Secondary | ICD-10-CM

## 2023-11-11 NOTE — Progress Notes (Signed)
 Patient presents to the office today for diabetic shoe and insole measuring.  Patient was measured with brannock device to determine size and width for 1 pair of extra depth shoes and foam casted for 3 pair of insoles.   Documentation of medical necessity will be sent to patient's treating diabetic doctor to verify and sign.   Patient's diabetic provider: Eleanora Neighbor MD   Shoes and insoles will be ordered at that time and patient will be notified for an appointment for fitting when they arrive.   Shoe size (per patient): 11 Brannock measurement: 11 Shoe choice:   LT410 / LT460 Shoe size ordered:  Ppw ABN signed

## 2023-12-03 DIAGNOSIS — E0840 Diabetes mellitus due to underlying condition with diabetic neuropathy, unspecified: Secondary | ICD-10-CM | POA: Diagnosis not present

## 2023-12-03 DIAGNOSIS — I5033 Acute on chronic diastolic (congestive) heart failure: Secondary | ICD-10-CM | POA: Diagnosis not present

## 2023-12-03 DIAGNOSIS — I10 Essential (primary) hypertension: Secondary | ICD-10-CM | POA: Diagnosis not present

## 2023-12-03 DIAGNOSIS — E11621 Type 2 diabetes mellitus with foot ulcer: Secondary | ICD-10-CM | POA: Diagnosis not present

## 2023-12-09 DIAGNOSIS — E0840 Diabetes mellitus due to underlying condition with diabetic neuropathy, unspecified: Secondary | ICD-10-CM | POA: Diagnosis not present

## 2023-12-09 DIAGNOSIS — E11621 Type 2 diabetes mellitus with foot ulcer: Secondary | ICD-10-CM | POA: Diagnosis not present

## 2023-12-09 DIAGNOSIS — I10 Essential (primary) hypertension: Secondary | ICD-10-CM | POA: Diagnosis not present

## 2023-12-09 DIAGNOSIS — F322 Major depressive disorder, single episode, severe without psychotic features: Secondary | ICD-10-CM | POA: Diagnosis not present

## 2023-12-09 DIAGNOSIS — I4891 Unspecified atrial fibrillation: Secondary | ICD-10-CM | POA: Diagnosis not present

## 2023-12-09 DIAGNOSIS — I509 Heart failure, unspecified: Secondary | ICD-10-CM | POA: Diagnosis not present

## 2023-12-09 DIAGNOSIS — I5033 Acute on chronic diastolic (congestive) heart failure: Secondary | ICD-10-CM | POA: Diagnosis not present

## 2023-12-10 ENCOUNTER — Telehealth: Payer: Self-pay

## 2023-12-10 NOTE — Telephone Encounter (Signed)
 Lm for patient that DM shoe ppw has not been received by  DR. [facility] / fax # [phone]

## 2024-01-02 DIAGNOSIS — E11621 Type 2 diabetes mellitus with foot ulcer: Secondary | ICD-10-CM | POA: Diagnosis not present

## 2024-01-02 DIAGNOSIS — E0840 Diabetes mellitus due to underlying condition with diabetic neuropathy, unspecified: Secondary | ICD-10-CM | POA: Diagnosis not present

## 2024-01-02 DIAGNOSIS — I5033 Acute on chronic diastolic (congestive) heart failure: Secondary | ICD-10-CM | POA: Diagnosis not present

## 2024-01-02 DIAGNOSIS — I10 Essential (primary) hypertension: Secondary | ICD-10-CM | POA: Diagnosis not present

## 2024-01-09 DIAGNOSIS — E0840 Diabetes mellitus due to underlying condition with diabetic neuropathy, unspecified: Secondary | ICD-10-CM | POA: Diagnosis not present

## 2024-01-09 DIAGNOSIS — I5033 Acute on chronic diastolic (congestive) heart failure: Secondary | ICD-10-CM | POA: Diagnosis not present

## 2024-01-09 DIAGNOSIS — I509 Heart failure, unspecified: Secondary | ICD-10-CM | POA: Diagnosis not present

## 2024-01-09 DIAGNOSIS — F322 Major depressive disorder, single episode, severe without psychotic features: Secondary | ICD-10-CM | POA: Diagnosis not present

## 2024-01-09 DIAGNOSIS — I4891 Unspecified atrial fibrillation: Secondary | ICD-10-CM | POA: Diagnosis not present

## 2024-01-09 DIAGNOSIS — E11621 Type 2 diabetes mellitus with foot ulcer: Secondary | ICD-10-CM | POA: Diagnosis not present

## 2024-01-09 DIAGNOSIS — I10 Essential (primary) hypertension: Secondary | ICD-10-CM | POA: Diagnosis not present

## 2024-01-25 ENCOUNTER — Telehealth: Payer: Self-pay | Admitting: Podiatry

## 2024-01-25 DIAGNOSIS — H3530 Unspecified macular degeneration: Secondary | ICD-10-CM | POA: Diagnosis not present

## 2024-01-25 DIAGNOSIS — M109 Gout, unspecified: Secondary | ICD-10-CM | POA: Diagnosis not present

## 2024-01-25 DIAGNOSIS — Z89432 Acquired absence of left foot: Secondary | ICD-10-CM | POA: Diagnosis not present

## 2024-01-25 DIAGNOSIS — Z0000 Encounter for general adult medical examination without abnormal findings: Secondary | ICD-10-CM | POA: Diagnosis not present

## 2024-01-25 DIAGNOSIS — R413 Other amnesia: Secondary | ICD-10-CM | POA: Diagnosis not present

## 2024-01-25 DIAGNOSIS — D649 Anemia, unspecified: Secondary | ICD-10-CM | POA: Diagnosis not present

## 2024-01-25 DIAGNOSIS — Z1331 Encounter for screening for depression: Secondary | ICD-10-CM | POA: Diagnosis not present

## 2024-01-25 DIAGNOSIS — D6869 Other thrombophilia: Secondary | ICD-10-CM | POA: Diagnosis not present

## 2024-01-25 DIAGNOSIS — I4891 Unspecified atrial fibrillation: Secondary | ICD-10-CM | POA: Diagnosis not present

## 2024-01-25 DIAGNOSIS — E0840 Diabetes mellitus due to underlying condition with diabetic neuropathy, unspecified: Secondary | ICD-10-CM | POA: Diagnosis not present

## 2024-01-25 DIAGNOSIS — G4733 Obstructive sleep apnea (adult) (pediatric): Secondary | ICD-10-CM | POA: Diagnosis not present

## 2024-01-25 DIAGNOSIS — I10 Essential (primary) hypertension: Secondary | ICD-10-CM | POA: Diagnosis not present

## 2024-01-25 DIAGNOSIS — E559 Vitamin D deficiency, unspecified: Secondary | ICD-10-CM | POA: Diagnosis not present

## 2024-01-25 DIAGNOSIS — R2689 Other abnormalities of gait and mobility: Secondary | ICD-10-CM | POA: Diagnosis not present

## 2024-01-25 DIAGNOSIS — E1159 Type 2 diabetes mellitus with other circulatory complications: Secondary | ICD-10-CM | POA: Diagnosis not present

## 2024-01-25 DIAGNOSIS — I509 Heart failure, unspecified: Secondary | ICD-10-CM | POA: Diagnosis not present

## 2024-01-25 NOTE — Telephone Encounter (Signed)
 [facility] reached out on behalf of the patient and his shoes. They are wanting to know the status of the shoes and if they've been ordered. I see per the last telephone encounter that we had faxed paperwork over to his PCP office. They state that they have filled out their portion and sent to [facility].

## 2024-01-26 NOTE — Telephone Encounter (Signed)
 Danette from [facility] calling to see if there has been a response to message sent yesterday. She is requesting a follow-up call with an update/status. Please call [phone]. Thank you

## 2024-02-01 DIAGNOSIS — I5033 Acute on chronic diastolic (congestive) heart failure: Secondary | ICD-10-CM | POA: Diagnosis not present

## 2024-02-01 DIAGNOSIS — E0840 Diabetes mellitus due to underlying condition with diabetic neuropathy, unspecified: Secondary | ICD-10-CM | POA: Diagnosis not present

## 2024-02-01 DIAGNOSIS — I10 Essential (primary) hypertension: Secondary | ICD-10-CM | POA: Diagnosis not present

## 2024-02-01 DIAGNOSIS — E11621 Type 2 diabetes mellitus with foot ulcer: Secondary | ICD-10-CM | POA: Diagnosis not present

## 2024-02-02 ENCOUNTER — Ambulatory Visit (INDEPENDENT_AMBULATORY_CARE_PROVIDER_SITE_OTHER): Admitting: Podiatry

## 2024-02-02 ENCOUNTER — Ambulatory Visit (INDEPENDENT_AMBULATORY_CARE_PROVIDER_SITE_OTHER)

## 2024-02-02 DIAGNOSIS — Z89432 Acquired absence of left foot: Secondary | ICD-10-CM

## 2024-02-02 DIAGNOSIS — E11628 Type 2 diabetes mellitus with other skin complications: Secondary | ICD-10-CM

## 2024-02-02 DIAGNOSIS — M2142 Flat foot [pes planus] (acquired), left foot: Secondary | ICD-10-CM | POA: Diagnosis not present

## 2024-02-02 DIAGNOSIS — M79674 Pain in right toe(s): Secondary | ICD-10-CM

## 2024-02-02 DIAGNOSIS — I739 Peripheral vascular disease, unspecified: Secondary | ICD-10-CM

## 2024-02-02 DIAGNOSIS — L84 Corns and callosities: Secondary | ICD-10-CM | POA: Diagnosis not present

## 2024-02-02 DIAGNOSIS — B351 Tinea unguium: Secondary | ICD-10-CM | POA: Diagnosis not present

## 2024-02-02 DIAGNOSIS — M2141 Flat foot [pes planus] (acquired), right foot: Secondary | ICD-10-CM

## 2024-02-02 NOTE — Progress Notes (Signed)
 Subjective:  Patient ID: Oscar JONELLE Paddy Mickey., male    DOB: 03-02-1946,  MRN: [id number]  Oscar JONELLE Paddy Mickey. presents to clinic today for:  Chief Complaint  Patient presents with   Diabetes    Rm5 diabetic foot care/ amputation left foot toes 1-5/ A1c 8.1/ eliquis /   Patient notes nails are thick and elongated, causing pain in shoe gear when ambulating.  He has previous history of a left transmetatarsal amputation.  He does have difficulty hearing and a family member is with him today.  He states he is also here to have his diabetic shoes dispensed.  He has an appointment with our pedorthist immediately after this appointment.  There is a small callus near the great toe joint on the right foot.  He does not moisturize his skin regularly.  PCP is Charlott Dorn LABOR, MD. last seen 02/02/2024  Past Medical History:  Diagnosis Date   Anal fissure    h/o - no recent complications   Arthritis    hips   Atrial fibrillation (HCC)    CKD (chronic kidney disease)    Depression    Diabetes mellitus without complication (HCC)    Type II   Enlarged prostate    GERD (gastroesophageal reflux disease)    Gout    H/O hiatal hernia    Hard of hearing    History of kidney stones    Hx of typhoid fever    as a child   Hypertension    improved after diet/exercise   Memory change 04/14/2017   OSA (obstructive sleep apnea)    wears cpap   Pneumonia    PONV (postoperative nausea and vomiting)    also difficulty waking up   Stroke (HCC) 08/2013   short term memory loss (slight)   Allergies  Allergen Reactions   Iohexol  Hives and Other (See Comments)     Desc: HIVES S/P 13HR.PREMEDS, ?LOW DOSAGE PREMEDS    Ivp Dye [Iodinated Contrast Media] Hives and Other (See Comments)    welps    Sulfamethoxazole-Trimethoprim Other (See Comments)    Gi upset    Objective:  Drury Ardizzone. is a pleasant 78 y.o. male in NAD. AAO x 3.  Vascular Examination: Patient has palpable DP pulse,  absent PT pulse bilateral.  Delayed capillary refill bilateral toes.  Sparse digital hair bilateral.  Proximal to distal cooling WNL bilateral.    Dermatological Examination: Interspaces are clear with no open lesions noted bilateral.  Skin is shiny and atrophic bilateral.  Nails are 3-25mm thick, with yellowish/brown discoloration, subungual debris and distal onycholysis x 5.  There is pain with compression of nails x 5.  There are hyperkeratotic lesions noted right submet 1.  Skin is dry bilateral  Patient qualifies for at-risk foot care because of previous amputation/TMA left foot.  Assessment/Plan: 1. Type 2 diabetes mellitus with other skin complication, unspecified whether long term insulin  use (HCC)   2. History of transmetatarsal amputation of left foot (HCC)   3. Callus of foot   4. Dermatophytosis of nail   5. Pain in right toe(s)     Mycotic nails x 5 were sharply debrided with sterile nail nippers and power debriding burr to decrease bulk and length.  Hyperkeratotic lesions right submet 1 was shaved with #312 blade.  Return in about 3 months (around 05/04/2024) for [facility].  He was escorted over to the shoe lab and met up with our pedorthist for the  remainder of his care.   Awanda CHARM Imperial, DPM, FACFAS [facility]     [address] [place], [place] [address]                Office [phone]  Fax [phone]

## 2024-02-02 NOTE — Progress Notes (Signed)
 Patient presents today to pick up diabetic shoes and insoles.  Patient was dispensed 1 pair of diabetic shoes and 3 ea of foam casted diabetic insoles for right foot and Toefiller 1ea for Left. Fit was satisfactory. Instructions for break-in and wear was reviewed and a copy was given to the patient.   Re-appointment for regularly scheduled diabetic foot care visits or if they should experience any trouble with the shoes or insoles.

## 2024-02-08 DIAGNOSIS — I509 Heart failure, unspecified: Secondary | ICD-10-CM | POA: Diagnosis not present

## 2024-02-08 DIAGNOSIS — I4891 Unspecified atrial fibrillation: Secondary | ICD-10-CM | POA: Diagnosis not present

## 2024-02-08 DIAGNOSIS — F322 Major depressive disorder, single episode, severe without psychotic features: Secondary | ICD-10-CM | POA: Diagnosis not present

## 2024-02-08 DIAGNOSIS — I10 Essential (primary) hypertension: Secondary | ICD-10-CM | POA: Diagnosis not present

## 2024-02-08 DIAGNOSIS — E11621 Type 2 diabetes mellitus with foot ulcer: Secondary | ICD-10-CM | POA: Diagnosis not present

## 2024-02-08 DIAGNOSIS — I5033 Acute on chronic diastolic (congestive) heart failure: Secondary | ICD-10-CM | POA: Diagnosis not present

## 2024-02-08 DIAGNOSIS — E0840 Diabetes mellitus due to underlying condition with diabetic neuropathy, unspecified: Secondary | ICD-10-CM | POA: Diagnosis not present

## 2024-02-29 DIAGNOSIS — D649 Anemia, unspecified: Secondary | ICD-10-CM | POA: Diagnosis not present

## 2024-03-10 DIAGNOSIS — F322 Major depressive disorder, single episode, severe without psychotic features: Secondary | ICD-10-CM | POA: Diagnosis not present

## 2024-03-10 DIAGNOSIS — I4891 Unspecified atrial fibrillation: Secondary | ICD-10-CM | POA: Diagnosis not present

## 2024-03-10 DIAGNOSIS — I509 Heart failure, unspecified: Secondary | ICD-10-CM | POA: Diagnosis not present

## 2024-04-01 DIAGNOSIS — E11621 Type 2 diabetes mellitus with foot ulcer: Secondary | ICD-10-CM | POA: Diagnosis not present

## 2024-04-01 DIAGNOSIS — I5033 Acute on chronic diastolic (congestive) heart failure: Secondary | ICD-10-CM | POA: Diagnosis not present

## 2024-04-01 DIAGNOSIS — E0840 Diabetes mellitus due to underlying condition with diabetic neuropathy, unspecified: Secondary | ICD-10-CM | POA: Diagnosis not present

## 2024-04-01 DIAGNOSIS — I10 Essential (primary) hypertension: Secondary | ICD-10-CM | POA: Diagnosis not present

## 2024-04-10 DIAGNOSIS — I10 Essential (primary) hypertension: Secondary | ICD-10-CM | POA: Diagnosis not present

## 2024-04-10 DIAGNOSIS — I509 Heart failure, unspecified: Secondary | ICD-10-CM | POA: Diagnosis not present

## 2024-04-10 DIAGNOSIS — I4891 Unspecified atrial fibrillation: Secondary | ICD-10-CM | POA: Diagnosis not present

## 2024-04-10 DIAGNOSIS — E11621 Type 2 diabetes mellitus with foot ulcer: Secondary | ICD-10-CM | POA: Diagnosis not present

## 2024-04-10 DIAGNOSIS — E0840 Diabetes mellitus due to underlying condition with diabetic neuropathy, unspecified: Secondary | ICD-10-CM | POA: Diagnosis not present

## 2024-04-10 DIAGNOSIS — F322 Major depressive disorder, single episode, severe without psychotic features: Secondary | ICD-10-CM | POA: Diagnosis not present

## 2024-04-10 DIAGNOSIS — I5033 Acute on chronic diastolic (congestive) heart failure: Secondary | ICD-10-CM | POA: Diagnosis not present

## 2024-05-10 ENCOUNTER — Encounter: Payer: Self-pay | Admitting: Podiatry

## 2024-05-10 ENCOUNTER — Ambulatory Visit (INDEPENDENT_AMBULATORY_CARE_PROVIDER_SITE_OTHER): Admitting: Podiatry

## 2024-05-10 DIAGNOSIS — E11628 Type 2 diabetes mellitus with other skin complications: Secondary | ICD-10-CM

## 2024-05-10 DIAGNOSIS — M79674 Pain in right toe(s): Secondary | ICD-10-CM | POA: Diagnosis not present

## 2024-05-10 DIAGNOSIS — Z89432 Acquired absence of left foot: Secondary | ICD-10-CM

## 2024-05-10 DIAGNOSIS — B351 Tinea unguium: Secondary | ICD-10-CM

## 2024-05-10 DIAGNOSIS — F322 Major depressive disorder, single episode, severe without psychotic features: Secondary | ICD-10-CM | POA: Diagnosis not present

## 2024-05-10 DIAGNOSIS — I4891 Unspecified atrial fibrillation: Secondary | ICD-10-CM | POA: Diagnosis not present

## 2024-05-10 DIAGNOSIS — I509 Heart failure, unspecified: Secondary | ICD-10-CM | POA: Diagnosis not present

## 2024-05-10 NOTE — Progress Notes (Unsigned)
 Subjective:  Patient ID: Oscar JONELLE Paddy Mickey., male    DOB: 1945-12-20,  MRN: [id number]  Oscar JONELLE Paddy Mickey. presents to clinic today for:  Chief Complaint  Patient presents with   Diabetes    [facility] IDDM A1C 7.5. Transmet amputation left foot. Toenail trim and callus care right foot. Pt. Has Neuropathy. Little feeling in foot.   Patient notes nails are thick and elongated, causing pain in shoe gear when ambulating.  He has a previous transmetatarsal amputation on the left foot.  There are 2 small scabs on the dorsal toes on the right foot.  He stated this is most likely from banging it against something.  He does not recall any specific injury  PCP is Charlott Dorn LABOR, MD. last seen 03/17/2024  Past Medical History:  Diagnosis Date   Anal fissure    h/o - no recent complications   Arthritis    hips   Atrial fibrillation (HCC)    CKD (chronic kidney disease)    Depression    Diabetes mellitus without complication (HCC)    Type II   Enlarged prostate    GERD (gastroesophageal reflux disease)    Gout    H/O hiatal hernia    Hard of hearing    History of kidney stones    Hx of typhoid fever    as a child   Hypertension    improved after diet/exercise   Memory change 04/14/2017   OSA (obstructive sleep apnea)    wears cpap   Pneumonia    PONV (postoperative nausea and vomiting)    also difficulty waking up   Stroke (HCC) 08/2013   short term memory loss (slight)   Allergies  Allergen Reactions   Iohexol  Hives and Other (See Comments)     Desc: HIVES S/P 13HR.PREMEDS, ?LOW DOSAGE PREMEDS    Ivp Dye [Iodinated Contrast Media] Hives and Other (See Comments)    welps    Sulfamethoxazole-Trimethoprim Other (See Comments)    Gi upset    Objective:  Bassam Dresch. is a pleasant 77 y.o. male in NAD. AAO x 3.  Vascular Examination: Patient has palpable DP pulse, absent PT pulse right foot delayed capillary refill right toes.  Sparse digital hair   Dermatological  Examination: Interspaces are clear with no open lesions noted right foot..  Skin is shiny and atrophic bilateral.  Nails are 3-64mm thick, with yellowish/brown discoloration, subungual debris and distal onycholysis x 5.  There is pain with compression of nails x 5.  There are superficial eschars noted on the dorsal aspect of the right 2nd and 3rd toes at the PIP joint.  These are dry and stable.  There is no surrounding erythema , no active drainage and no clinical signs of infection  Patient qualifies for at-risk foot care because of history of transmetatarsal amputation left foot.  Assessment/Plan: 1. History of transmetatarsal amputation of left foot (HCC)   2. Dermatophytosis of nail   3. Pain in right toe(s)   4. Type 2 diabetes mellitus with other skin complication, unspecified whether long term insulin  use (HCC)    Mycotic nails x 5 were sharply debrided with sterile nail nippers and power debriding burr to decrease bulk and length.   Return in about 3 months (around 08/09/2024) for [facility].   Awanda CHARM [place], DPM, FACFAS [facility]     [address]  [place], [place] [address]                Office [phone]  Fax [phone]

## 2024-06-10 DIAGNOSIS — I10 Essential (primary) hypertension: Secondary | ICD-10-CM | POA: Diagnosis not present

## 2024-06-10 DIAGNOSIS — I4891 Unspecified atrial fibrillation: Secondary | ICD-10-CM | POA: Diagnosis not present

## 2024-06-10 DIAGNOSIS — E0840 Diabetes mellitus due to underlying condition with diabetic neuropathy, unspecified: Secondary | ICD-10-CM | POA: Diagnosis not present

## 2024-06-10 DIAGNOSIS — F322 Major depressive disorder, single episode, severe without psychotic features: Secondary | ICD-10-CM | POA: Diagnosis not present

## 2024-06-10 DIAGNOSIS — E11621 Type 2 diabetes mellitus with foot ulcer: Secondary | ICD-10-CM | POA: Diagnosis not present

## 2024-06-10 DIAGNOSIS — I509 Heart failure, unspecified: Secondary | ICD-10-CM | POA: Diagnosis not present

## 2024-06-10 DIAGNOSIS — I5033 Acute on chronic diastolic (congestive) heart failure: Secondary | ICD-10-CM | POA: Diagnosis not present

## 2024-07-10 DIAGNOSIS — F322 Major depressive disorder, single episode, severe without psychotic features: Secondary | ICD-10-CM | POA: Diagnosis not present

## 2024-07-10 DIAGNOSIS — I509 Heart failure, unspecified: Secondary | ICD-10-CM | POA: Diagnosis not present

## 2024-07-10 DIAGNOSIS — I4891 Unspecified atrial fibrillation: Secondary | ICD-10-CM | POA: Diagnosis not present

## 2024-07-26 DIAGNOSIS — E1159 Type 2 diabetes mellitus with other circulatory complications: Secondary | ICD-10-CM | POA: Diagnosis not present

## 2024-08-09 ENCOUNTER — Ambulatory Visit: Admitting: Podiatry

## 2024-08-18 ENCOUNTER — Ambulatory Visit: Admitting: Podiatry

## 2024-08-30 ENCOUNTER — Ambulatory Visit: Admitting: Podiatry

## 2024-09-21 ENCOUNTER — Ambulatory Visit: Admitting: Podiatry
